# Patient Record
Sex: Female | Born: 1970 | Race: Black or African American | Hispanic: No | Marital: Single | Smoking: Former smoker
Health system: Southern US, Community
[De-identification: ages and names within clinical notes are randomized; demographics above are authoritative.]

## PROBLEM LIST (undated history)

## (undated) ENCOUNTER — Ambulatory Visit (HOSPITAL_COMMUNITY): Admission: EM | Source: Home / Self Care

## (undated) DIAGNOSIS — F32A Depression, unspecified: Secondary | ICD-10-CM

## (undated) DIAGNOSIS — Z720 Tobacco use: Secondary | ICD-10-CM

## (undated) DIAGNOSIS — C801 Malignant (primary) neoplasm, unspecified: Secondary | ICD-10-CM

## (undated) DIAGNOSIS — Z923 Personal history of irradiation: Secondary | ICD-10-CM

## (undated) DIAGNOSIS — D649 Anemia, unspecified: Secondary | ICD-10-CM

## (undated) DIAGNOSIS — K5190 Ulcerative colitis, unspecified, without complications: Secondary | ICD-10-CM

## (undated) DIAGNOSIS — K611 Rectal abscess: Secondary | ICD-10-CM

## (undated) DIAGNOSIS — F419 Anxiety disorder, unspecified: Secondary | ICD-10-CM

## (undated) DIAGNOSIS — K589 Irritable bowel syndrome without diarrhea: Secondary | ICD-10-CM

## (undated) DIAGNOSIS — E46 Unspecified protein-calorie malnutrition: Secondary | ICD-10-CM

## (undated) DIAGNOSIS — K219 Gastro-esophageal reflux disease without esophagitis: Secondary | ICD-10-CM

## (undated) HISTORY — DX: Malignant (primary) neoplasm, unspecified: C80.1

## (undated) HISTORY — DX: Ulcerative colitis, unspecified, without complications: K51.90

## (undated) HISTORY — DX: Depression, unspecified: F32.A

## (undated) HISTORY — DX: Anxiety disorder, unspecified: F41.9

---

## 2013-12-23 ENCOUNTER — Encounter (HOSPITAL_COMMUNITY): Payer: Self-pay | Admitting: Emergency Medicine

## 2013-12-23 ENCOUNTER — Emergency Department (HOSPITAL_COMMUNITY)
Admission: EM | Admit: 2013-12-23 | Discharge: 2013-12-23 | Disposition: A | Discharge status: Home / Self Care | Payer: Medicaid Other | Attending: Emergency Medicine | Admitting: Emergency Medicine

## 2013-12-23 DIAGNOSIS — L729 Follicular cyst of the skin and subcutaneous tissue, unspecified: Secondary | ICD-10-CM | POA: Diagnosis not present

## 2013-12-23 DIAGNOSIS — K130 Diseases of lips: Secondary | ICD-10-CM | POA: Diagnosis present

## 2013-12-23 DIAGNOSIS — K029 Dental caries, unspecified: Secondary | ICD-10-CM | POA: Insufficient documentation

## 2013-12-23 DIAGNOSIS — R220 Localized swelling, mass and lump, head: Secondary | ICD-10-CM

## 2013-12-23 DIAGNOSIS — K047 Periapical abscess without sinus: Secondary | ICD-10-CM | POA: Diagnosis not present

## 2013-12-23 DIAGNOSIS — L0291 Cutaneous abscess, unspecified: Secondary | ICD-10-CM

## 2013-12-23 DIAGNOSIS — Z720 Tobacco use: Secondary | ICD-10-CM | POA: Diagnosis not present

## 2013-12-23 MED ORDER — AMOXICILLIN-POT CLAVULANATE 500-125 MG PO TABS: 1.0000 | ORAL_TABLET | Freq: Three times a day (TID) | ORAL | Status: DC

## 2013-12-23 MED ORDER — IBUPROFEN 600 MG PO TABS: 600.0000 mg | ORAL_TABLET | Freq: Four times a day (QID) | ORAL | Status: DC | PRN

## 2013-12-23 MED ORDER — LIDOCAINE-EPINEPHRINE (PF) 2 %-1:200000 IJ SOLN
10.0000 mL | Freq: Once | INTRAMUSCULAR | Status: AC
  Administered 2013-12-23: 10 mL
  Filled 2013-12-23: qty 10

## 2013-12-23 NOTE — Discharge Instructions (Signed)
Be sure to complete all of your antibiotics even if you start to feel better.  Follow up with your dentist as well as your primary care provider for recheck of symptoms in 2-3 days to ensure they are improving.  You should be seen by a general surgeon for further evaluation and removal of cyst near your left eye.  Try not to rub, bump, or otherwise, irritate the cyst as this may cause cyst to get larger or become more sore.  See medical assistance if cyst becomes red, hot, causes fever, or other new concerning symptoms develop.

## 2013-12-23 NOTE — ED Notes (Signed)
Per pt, states she has boil under upper lip-states she has had before

## 2013-12-23 NOTE — ED Provider Notes (Signed)
CSN: [id number]     Arrival date & time 12/23/13  0998 History   First MD Initiated Contact with Patient 12/23/13 [phone]     Chief Complaint  Patient presents with  . Abscess     (Consider location/radiation/quality/duration/timing/severity/associated sxs/prior Treatment) HPI Pt is a 43yo female c/o upper lip pain and swelling that started 2 days ago, associated constant throbbing pain, 10/10 at worst, pain radiating to her nose "due to the pressure and swelling"  Pt reports hx of similar symptoms. States she normally has to have the doctor lance the site to let it drain. States she has been seen by her dentist who believes it is due to an ENT/sinus type issue rather than due to her gums and teeth.  Pt is in from out of town from [place] but will return home in the next 2-3 days.  Denies fever, n/v/d. Has not tried anything for symptoms.  Pt also c/o gradually worsening mass on left side of her face next hear left eye. Pt was advised by her PCP it was a cyst, it started 2 weeks ago but states it seems to be getting bigger and softer. Denies pain or change in color of skin but is concerned it may be an abscess. States she wants a second opinion.    History reviewed. No pertinent past medical history. History reviewed. No pertinent past surgical history. No family history on file. History  Substance Use Topics  . Smoking status: Current Every Day Smoker  . Smokeless tobacco: Not on file  . Alcohol Use: No   OB History   Grav Para Term Preterm Abortions TAB SAB Ect Mult Living                 Review of Systems  Constitutional: Negative for fever and chills.  HENT: Positive for facial swelling ( next to left eye and upper lip). Negative for sore throat, trouble swallowing and voice change.   Eyes: Negative for photophobia, pain, redness and visual disturbance.  Gastrointestinal: Negative for nausea and vomiting.  Musculoskeletal: Negative for myalgias, neck pain and neck stiffness.    Skin: Positive for wound. Negative for color change, pallor and rash.  All other systems reviewed and are negative.     Allergies  Review of patient's allergies indicates not on file.  Home Medications   Prior to Admission medications   Medication Sig Start Date End Date Taking? Authorizing Provider  amoxicillin-clavulanate (AUGMENTIN) 500-125 MG per tablet Take 1 tablet (500 mg total) by mouth every 8 (eight) hours. 12/23/13   Noland Fordyce, PA-C  ibuprofen (ADVIL,MOTRIN) 600 MG tablet Take 1 tablet (600 mg total) by mouth every 6 (six) hours as needed. 12/23/13   Noland Fordyce, PA-C   BP 114/77  Pulse 81  Temp(Src) 97.5 F (36.4 C) (Oral)  Resp 16  SpO2 100%  LMP 12/16/2013 Physical Exam  Nursing note and vitals reviewed. Constitutional: She is oriented to person, place, and time. She appears well-developed and well-nourished.  HENT:  Head: Normocephalic and atraumatic.    Nose: Nose normal.  Mouth/Throat: Uvula is midline, oropharynx is clear and moist and mucous membranes are normal. No oral lesions. Abnormal dentition (buccal mucosa of upper lip). Dental abscesses and dental caries present.    Partial fake tooth #8.  Mild edema with tenderness and fluctuance upper lip causing swelling just below nose. Tender inside and outside of upper lip. No active discharge.   Left side of face, corner of left eye: 1cm circular  mobile mass. No erythema or tenderness.  C/w sebaceous cyst.   Eyes: EOM are normal.  Neck: Normal range of motion.  Cardiovascular: Normal rate.   Pulmonary/Chest: Effort normal.  Musculoskeletal: Normal range of motion.  Neurological: She is alert and oriented to person, place, and time.  Skin: Skin is warm and dry.  Psychiatric: She has a normal mood and affect. Her behavior is normal.    ED Course  Procedures   INCISION AND DRAINAGE Performed by: Noland Fordyce A. Consent: Verbal consent obtained. Risks and benefits: risks, benefits and  alternatives were discussed Type: abscess  Body area: upper lip, buccal mucosa   Anesthesia: local infiltration  Incision was made with a scalpel.  Local anesthetic: lidocaine 2% with epinephrine  Anesthetic total: 0.5 ml  Complexity: complex Blunt dissection to break up loculations  Drainage: purulent  Drainage amount: moderate  Packing material: none  Patient tolerance: Patient tolerated the procedure well with no immediate complications.     Labs Review Labs Reviewed - No data to display  Imaging Review No results found.   EKG Interpretation None      MDM   Final diagnoses:  Abscess  Lip pain  Lip swelling  Cyst of skin    Pt presenting to ED with abscess of upper lip. Hx of same. I&D successfully performed w/o immediate complications. Due to relation to sinuses and pt's hx or recurrent abscess in same place, Rx: augmentin. Advised pt to f/u with ENT for further evaluation and treatment of abscess. Home care instructions provided. Mass on left side of pt's face c/w sebaceous cyst. Reassured pt lesion does not appear to be an abscess. No I&D needed at this time. Recommended pt f/u with general surgery when she gets back home for further evaluation and removal of cyst.  Return precautions provided. Pt verbalized understanding and agreement with tx plan.     Noland Fordyce, PA-C 12/23/13 1114

## 2013-12-23 NOTE — Progress Notes (Signed)
  CARE MANAGEMENT ED NOTE 12/23/2013  Patient:  Nicole Browning, Nicole Browning   Account Number:  [id number]  Date Initiated:  12/23/2013  Documentation initiated by:  Jackelyn Poling  Subjective/Objective Assessment:   43 yr old [place] access pt from [place] c/o boil under upper lip-states she has had before     Subjective/Objective Assessment Detail:   [facility] [address] [place] [phone] is pcp     Action/Plan:   updated epic   Action/Plan Detail:   Anticipated DC Date:  12/23/2013     Status Recommendation to Physician:   Result of Recommendation:    Other ED Services  Consult Working [place]  Other  PCP issues  Outpatient Services - Pt will follow up    Choice offered to / List presented to:            Status of service:  Completed, signed off  ED Comments:   ED Comments Detail:

## 2013-12-28 NOTE — ED Provider Notes (Signed)
Medical screening examination/treatment/procedure(s) were performed by non-physician practitioner and as supervising physician I was immediately available for consultation/collaboration.   EKG Interpretation None       Virgel Manifold, MD 12/28/13 1049

## 2014-12-19 DIAGNOSIS — Z3202 Encounter for pregnancy test, result negative: Secondary | ICD-10-CM | POA: Insufficient documentation

## 2014-12-19 DIAGNOSIS — F419 Anxiety disorder, unspecified: Secondary | ICD-10-CM | POA: Insufficient documentation

## 2014-12-19 DIAGNOSIS — Y998 Other external cause status: Secondary | ICD-10-CM | POA: Diagnosis not present

## 2014-12-19 DIAGNOSIS — Y9289 Other specified places as the place of occurrence of the external cause: Secondary | ICD-10-CM | POA: Diagnosis not present

## 2014-12-19 DIAGNOSIS — S90862A Insect bite (nonvenomous), left foot, initial encounter: Secondary | ICD-10-CM | POA: Diagnosis not present

## 2014-12-19 DIAGNOSIS — R000 Tachycardia, unspecified: Secondary | ICD-10-CM | POA: Insufficient documentation

## 2014-12-19 DIAGNOSIS — Z720 Tobacco use: Secondary | ICD-10-CM | POA: Insufficient documentation

## 2014-12-19 DIAGNOSIS — W57XXXA Bitten or stung by nonvenomous insect and other nonvenomous arthropods, initial encounter: Secondary | ICD-10-CM | POA: Insufficient documentation

## 2014-12-19 DIAGNOSIS — Y9389 Activity, other specified: Secondary | ICD-10-CM | POA: Insufficient documentation

## 2014-12-20 ENCOUNTER — Encounter (HOSPITAL_COMMUNITY): Payer: Self-pay | Admitting: *Deleted

## 2014-12-20 ENCOUNTER — Emergency Department (HOSPITAL_COMMUNITY)
Admission: EM | Admit: 2014-12-20 | Discharge: 2014-12-20 | Disposition: A | Discharge status: Home / Self Care | Payer: Medicaid Other | Attending: Emergency Medicine | Admitting: Emergency Medicine

## 2014-12-20 DIAGNOSIS — R21 Rash and other nonspecific skin eruption: Secondary | ICD-10-CM

## 2014-12-20 DIAGNOSIS — T63301A Toxic effect of unspecified spider venom, accidental (unintentional), initial encounter: Secondary | ICD-10-CM

## 2014-12-20 LAB — COMPREHENSIVE METABOLIC PANEL
ALT: 14 U/L (ref 14–54)
ANION GAP: 11 (ref 5–15)
AST: 18 U/L (ref 15–41)
Albumin: 4 g/dL (ref 3.5–5.0)
Alkaline Phosphatase: 57 U/L (ref 38–126)
BILIRUBIN TOTAL: 0.6 mg/dL (ref 0.3–1.2)
BUN: 10 mg/dL (ref 6–20)
CO2: 23 mmol/L (ref 22–32)
Calcium: 9.7 mg/dL (ref 8.9–10.3)
Chloride: 100 mmol/L — ABNORMAL LOW (ref 101–111)
Creatinine, Ser: 0.91 mg/dL (ref 0.44–1.00)
GFR calc Af Amer: >60 mL/min (ref >60)
Glucose, Bld: 107 mg/dL — ABNORMAL HIGH (ref 65–99)
POTASSIUM: 3.5 mmol/L (ref 3.5–5.1)
Sodium: 134 mmol/L — ABNORMAL LOW (ref 135–145)
TOTAL PROTEIN: 7.4 g/dL (ref 6.5–8.1)

## 2014-12-20 LAB — APTT: APTT: 26 s (ref 24–37)

## 2014-12-20 LAB — I-STAT BETA HCG BLOOD, ED (MC, WL, AP ONLY)

## 2014-12-20 LAB — CBC WITH DIFFERENTIAL/PLATELET
Basophils Absolute: 0 10*3/uL (ref 0.0–0.1)
Basophils Relative: 0 %
EOS PCT: 0 %
Eosinophils Absolute: 0 10*3/uL (ref 0.0–0.7)
HEMATOCRIT: 32.3 % — AB (ref 36.0–46.0)
Hemoglobin: 11.1 g/dL — ABNORMAL LOW (ref 12.0–15.0)
LYMPHS PCT: 11 %
Lymphs Abs: 1.6 10*3/uL (ref 0.7–4.0)
MCH: 29.1 pg (ref 26.0–34.0)
MCHC: 34.4 g/dL (ref 30.0–36.0)
MCV: 84.6 fL (ref 78.0–100.0)
MONOS PCT: 5 %
Monocytes Absolute: 0.6 10*3/uL (ref 0.1–1.0)
NEUTROS ABS: 11.6 10*3/uL — AB (ref 1.7–7.7)
Neutrophils Relative %: 84 %
PLATELETS: 361 10*3/uL (ref 150–400)
RBC: 3.82 MIL/uL — ABNORMAL LOW (ref 3.87–5.11)
RDW: 13.2 % (ref 11.5–15.5)
WBC: 13.8 10*3/uL — ABNORMAL HIGH (ref 4.0–10.5)

## 2014-12-20 LAB — PROTIME-INR
INR: 1.09 (ref 0.00–1.49)
PROTHROMBIN TIME: 14.3 s (ref 11.6–15.2)

## 2014-12-20 LAB — CK: Total CK: 106 U/L (ref 38–234)

## 2014-12-20 MED ORDER — SODIUM CHLORIDE 0.9 % IV BOLUS (SEPSIS)
1000.0000 mL | Freq: Once | INTRAVENOUS | Status: AC
  Administered 2014-12-20: 1000 mL via INTRAVENOUS

## 2014-12-20 MED ORDER — DIPHENHYDRAMINE HCL 50 MG/ML IJ SOLN
25.0000 mg | Freq: Once | INTRAMUSCULAR | Status: AC
  Administered 2014-12-20: 25 mg via INTRAVENOUS
  Filled 2014-12-20: qty 1

## 2014-12-20 MED ORDER — ONDANSETRON HCL 4 MG/2ML IJ SOLN
4.0000 mg | Freq: Once | INTRAMUSCULAR | Status: AC
  Administered 2014-12-20: 4 mg via INTRAVENOUS
  Filled 2014-12-20: qty 2

## 2014-12-20 MED ORDER — FAMOTIDINE IN NACL 20-0.9 MG/50ML-% IV SOLN
20.0000 mg | Freq: Once | INTRAVENOUS | Status: AC
  Administered 2014-12-20: 20 mg via INTRAVENOUS
  Filled 2014-12-20: qty 50

## 2014-12-20 MED ORDER — ASPIRIN EC 325 MG PO TBEC
325.0000 mg | DELAYED_RELEASE_TABLET | Freq: Once | ORAL | Status: AC
  Administered 2014-12-20: 325 mg via ORAL
  Filled 2014-12-20: qty 1

## 2014-12-20 MED ORDER — HYDROCODONE-ACETAMINOPHEN 5-325 MG PO TABS: 1.0000 | ORAL_TABLET | ORAL | Status: DC | PRN

## 2014-12-20 MED ORDER — METHYLPREDNISOLONE SODIUM SUCC 125 MG IJ SOLR
125.0000 mg | Freq: Once | INTRAMUSCULAR | Status: AC
  Administered 2014-12-20: 125 mg via INTRAVENOUS
  Filled 2014-12-20: qty 2

## 2014-12-20 MED ORDER — DIPHENHYDRAMINE HCL 25 MG PO TABS
25.0000 mg | ORAL_TABLET | Freq: Four times a day (QID) | ORAL | Status: DC | PRN
Start: 2014-12-20 — End: 2016-02-29

## 2014-12-20 MED ORDER — PREDNISONE 20 MG PO TABS: ORAL_TABLET | ORAL | Status: DC

## 2014-12-20 MED ORDER — LORAZEPAM 2 MG/ML IJ SOLN
0.5000 mg | Freq: Once | INTRAMUSCULAR | Status: AC
  Administered 2014-12-20: 0.5 mg via INTRAVENOUS
  Filled 2014-12-20: qty 1

## 2014-12-20 NOTE — Discharge Instructions (Signed)
Spider Bite Spider bites are not common. When spider bites do happen, most do not cause serious health problems. There are only a few types of spider bites that can cause serious health problems. CAUSES A spider bite usually happens when a person accidentally makes contact with a spider in a way that traps the spider against the person's skin. SYMPTOMS Symptoms may vary depending on the type of spider. Some spider bites may cause symptoms within 1 hour after the bite. For other spider bites, it may take 1-2 days for symptoms to develop. Common symptoms include:  Redness and swelling in the area of the bite.  Discomfort or pain in the area of the bite. A few types of spiders, such as the black widow spider or the brown recluse spider, can inject poison (venom) into a bite wound. This venom causes more serious symptoms. Symptoms of a venomous spider bite vary, and may include:  Muscle cramps.  Nausea, vomiting, or abdominal pain.  Fever.  A skin sore (lesion) that spreads. This can break into an open wound (skin ulcer).  Light-headedness or dizziness. DIAGNOSIS This condition may be diagnosed based on your symptoms and a physical exam. Your health care provider will ask about the history of your injury and any details you may have about the spider. This may help to determine what type of spider it was that bit you. TREATMENT Many spider bites do not require treatment. If needed, treatment may include:  Icing and keeping the bite area raised (elevated).  Over-the-counter or prescription medicines to help control symptoms.  A tetanus shot.  Antibiotic medicines. HOME CARE INSTRUCTIONS Medicines  Take or apply over-the-counter and prescription medicines only as told by your health care provider.  If you were prescribed an antibiotic medicine, take or apply it as told by your health care provider. Do not stop using the antibiotic even if your condition improves. General  Instructions  Do not scratch the bite area.  Keep the bite area clean and dry. Wash the bite area daily with soap and water as told by your health care provider.  If directed, apply ice to the bite area.  Put ice in a plastic bag.  Place a towel between your skin and the bag.  Leave the ice on for 20 minutes, 2-3 times per day.  Elevate the affected area above the level of your heart while you are sitting or lying down, if possible.  Keep all follow-up visits as told by your health care provider. This is important. SEEK MEDICAL CARE IF:  Your bite does not get better after 3 days of treatment.  Your bite turns black or purple.  You have increased redness, swelling, or pain at the site of the bite. SEEK IMMEDIATE MEDICAL CARE IF:  You develop shortness of breath or chest pain.  You have fluid, blood, or pus coming from the bite area.  You have muscle cramps or painful muscle spasms.  You develop abdominal pain, nausea, or vomiting.  You feel unusually tired (fatigued) or sleepy.   This information is not intended to replace advice given to you by your health care provider. Make sure you discuss any questions you have with your health care provider.   Document Released: 03/27/2004 Document Revised: 11/08/2014 Document Reviewed: 07/05/2014 Elsevier Interactive Patient Education [employer].

## 2014-12-20 NOTE — ED Provider Notes (Signed)
CSN: [id number]   Arrival date & time 12/19/14 2346  History  By signing my name below, I, Nicole Browning, attest that this documentation has been prepared under the direction and in the presence of Julianne Rice, MD. Electronically Signed: Altamease Browning, ED Scribe. 12/20/2014. 12:24 AM.  Chief Complaint  Patient presents with  . Allergic Reaction  . Insect Bite    HPI The history is provided by the patient. No language interpreter was used.   Nicole Browning is a 44 y.o. female who presents to the Emergency Department complaining of possible spider bite with onset 30 minutes PTA. Pt states that she was clearing spider webs from a friend's house when she felt something bite her left 4th toe inside her sneaker. Pt did not see the insect that bit her. She describes the spiders in the house as "little" but notes that it was dark and she did not see them clearly. Since then she has a painful blister at the left 4th toe and a worsening red and painful rash over the bilateral lower extremities. She describes the pain as burning. Pt denies feeling hot or flushed, nausea, vomiting, tongue or throat swelling, and difficulty breathing. No chest pain or abdominal pain.   History reviewed. No pertinent past medical history.  History reviewed. No pertinent past surgical history.  History reviewed. No pertinent family history.  Social History  Substance Use Topics  . Smoking status: Current Every Day Smoker  . Smokeless tobacco: None  . Alcohol Use: No     Review of Systems  Constitutional: Negative for fever and chills.  HENT: Negative for facial swelling and trouble swallowing.   Respiratory: Negative for shortness of breath, wheezing and stridor.   Cardiovascular: Negative for chest pain and palpitations.  Gastrointestinal: Negative for nausea, vomiting and abdominal pain.  Musculoskeletal: Positive for myalgias. Negative for back pain, neck pain and neck stiffness.  Skin: Positive for  rash. Negative for wound.  Neurological: Negative for dizziness, syncope, weakness, light-headedness, numbness and headaches.  Psychiatric/Behavioral: The patient is nervous/anxious.   All other systems reviewed and are negative.  Home Medications   Prior to Admission medications   Medication Sig Start Date End Date Taking? Authorizing Provider  diphenhydrAMINE (BENADRYL) 25 MG tablet Take 1 tablet (25 mg total) by mouth every 6 (six) hours as needed for itching. 12/20/14   Julianne Rice, MD  HYDROcodone-acetaminophen (NORCO) 5-325 MG tablet Take 1 tablet by mouth every 4 (four) hours as needed for severe pain. 12/20/14   Julianne Rice, MD  predniSONE (DELTASONE) 20 MG tablet 3 tabs po day one, then 2 po daily x 4 days 12/20/14   Julianne Rice, MD    Allergies  Percocet and Valium  Triage Vitals: BP 126/74 mmHg  Pulse 134  Temp(Src) 98.3 F (36.8 C) (Oral)  Resp 16  SpO2 99%  LMP 11/22/2014 (Approximate)  Physical Exam  Constitutional: She is oriented to person, place, and time. She appears well-developed and well-nourished. No distress.  HENT:  Head: Normocephalic and atraumatic.  Mouth/Throat: Oropharynx is clear and moist. No oropharyngeal exudate.  Eyes: EOM are normal. Pupils are equal, round, and reactive to light.  Neck: Normal range of motion. Neck supple.  Cardiovascular: Regular rhythm.  Exam reveals no gallop and no friction rub.   No murmur heard. Tachycardia  Pulmonary/Chest: Effort normal and breath sounds normal. No stridor. No respiratory distress. She has no wheezes. She has no rales.  Abdominal: Soft. Bowel sounds are normal. She exhibits no  distension and no mass. There is no tenderness. There is no rebound and no guarding.  Musculoskeletal: Normal range of motion. She exhibits no edema or tenderness.  Patient has 2 small vesicles on the dorsal surface of the fourth digit of the left foot. There is mild surrounding erythema. Patient also has macular  erythematous rash extending up both lower extremities and irregular pattern. It is warm to touch. Distal pulses intact.  Neurological: She is alert and oriented to person, place, and time.  Moves all extremities without deficit. Sensation intact.  Skin: Skin is warm and dry. Rash noted. There is erythema.  Psychiatric: She has a normal mood and affect. Her behavior is normal.  Nursing note and vitals reviewed.   ED Course  Procedures   DIAGNOSTIC STUDIES: Oxygen Saturation is 99% on RA, normal by my interpretation.    COORDINATION OF CARE: 12:23 AM Discussed treatment plan which includes lab work, EKG, Ativan, Zofra, and IVF with pt at bedside and pt agreed to plan.  1:03 AM I re-evaluated the patient and her heart rate has improved.    Labs Reviewed  CBC WITH DIFFERENTIAL/PLATELET - Abnormal; Notable for the following:    WBC 13.8 (*)    RBC 3.82 (*)    Hemoglobin 11.1 (*)    HCT 32.3 (*)    Neutro Abs 11.6 (*)    All other components within normal limits  COMPREHENSIVE METABOLIC PANEL - Abnormal; Notable for the following:    Sodium 134 (*)    Chloride 100 (*)    Glucose, Bld 107 (*)    All other components within normal limits  PROTIME-INR  APTT  CK  I-STAT BETA HCG BLOOD, ED ([facility], [facility], [facility] ONLY)    Imaging Review No results found.  I personally reviewed and evaluated these lab results as a part of my medical decision-making.  EKG Interpretation  Date/Time:  Wednesday December 20 2014 00:56:13 EDT Ventricular Rate:  105 PR Interval:  191 QRS Duration: 84 QT Interval:  323 QTC Calculation: 427 R Axis:   80 Text Interpretation:  Sinus tachycardia Right atrial enlargement Confirmed by Lita Mains  MD, Raniah Karan ([id number]) on 12/20/2014 6:20:18 AM   MDM   Final diagnoses:  Rash and nonspecific skin eruption  Spider bite, accidental or unintentional, initial encounter     I, [name], personally performed the services described in this documentation. All  medical record entries made by the scribe were at my direction and in my presence.  I have reviewed the chart and discharge instructions and agree that the record reflects my personal performance and is accurate and complete. Dundy, Miller.  12/20/2014. 6:20 AM.   Patient with unknown bite to the fourth digit of her left foot. Blistering present. Rash to bilateral lower extremities. No constitutional signs or symptoms. Airway is intact. No intraoral swelling. Suspect localized reaction to possible spider bite.  Redness to the legs have mildly improved. Patient has been observed in the emergency department for greater than 6 hours now. No respiratory compromise. Vital signs are stable. Suspect inflammatory response versus allergic reaction. Do not believe that the patient's rash represents an infectious process. Patient understands the need to return immediately for any worsening of the rash, facial swelling, difficulty breathing, fever or for any concerns.  Julianne Rice, MD 12/20/14 [id number]

## 2014-12-20 NOTE — ED Notes (Signed)
Pt states she was bitten by something in her shoe twenty minutes ago. Pt presents with blister to left fourth toe and red rash to bilateral legs up to thighs. Pt states her legs burn, no respiratory distress noted

## 2015-05-03 ENCOUNTER — Emergency Department (HOSPITAL_COMMUNITY)
Admission: EM | Admit: 2015-05-03 | Discharge: 2015-05-03 | Disposition: A | Discharge status: Home / Self Care | Payer: Medicaid Other | Source: Home / Self Care

## 2015-05-20 ENCOUNTER — Emergency Department (INDEPENDENT_AMBULATORY_CARE_PROVIDER_SITE_OTHER)
Admission: EM | Admit: 2015-05-20 | Discharge: 2015-05-20 | Disposition: A | Discharge status: Home / Self Care | Payer: Medicaid Other | Source: Home / Self Care | Attending: Emergency Medicine | Admitting: Emergency Medicine

## 2015-05-20 ENCOUNTER — Encounter (HOSPITAL_COMMUNITY): Payer: Self-pay | Admitting: Emergency Medicine

## 2015-05-20 DIAGNOSIS — L02214 Cutaneous abscess of groin: Secondary | ICD-10-CM | POA: Diagnosis not present

## 2015-05-20 MED ORDER — HYDROCODONE-ACETAMINOPHEN 5-325 MG PO TABS: 1.0000 | ORAL_TABLET | ORAL | Status: DC | PRN

## 2015-05-20 MED ORDER — SULFAMETHOXAZOLE-TRIMETHOPRIM 800-160 MG PO TABS: 1.0000 | ORAL_TABLET | Freq: Two times a day (BID) | ORAL | Status: AC

## 2015-05-20 MED ORDER — LIDOCAINE HCL (PF) 2 % IJ SOLN
INTRAMUSCULAR | Status: AC
  Filled 2015-05-20: qty 2

## 2015-05-20 NOTE — ED Notes (Signed)
C/o abscess on pubic area onset x2 days... Has been applying Epson salt warm compression w/no relief Abscess is getting bigger and more painful... Denies fevers, drainage A&O x4... No acute distress.

## 2015-05-20 NOTE — ED Provider Notes (Signed)
CSN: [id number]     Arrival date & time 05/20/15  1450 History   First MD Initiated Contact with Nicole Browning 05/20/15 1616     Chief Complaint  Nicole Browning presents with  . Abscess   (Consider location/radiation/quality/duration/timing/severity/associated sxs/prior Treatment) HPI  Nicole Browning is a 45 year old woman here for evaluation of abscess. Nicole Browning states Nicole Browning noticed a small bump on the left vulva 2 days ago. Nicole Browning has been doing [employer] using an over-the-counter boil cream without improvement. It has gradually gotten larger and more tender. Nicole Browning denies any fevers. Nicole Browning states Nicole Browning used to have these frequently when Nicole Browning was shaving. Nicole Browning has since stopped shaving, and this is the first time Nicole Browning's had one.  History reviewed. No pertinent past medical history. History reviewed. No pertinent past surgical history. No family history on file. Social History  Substance Use Topics  . Smoking status: Current Every Day Smoker  . Smokeless tobacco: None  . Alcohol Use: No   OB History    No data available     Review of Systems As in history of present illness Allergies  Percocet and Valium  Home Medications   Prior to Admission medications   Medication Sig Start Date End Date Taking? Authorizing Provider  diphenhydrAMINE (BENADRYL) 25 MG tablet Take 1 tablet (25 mg total) by mouth every 6 (six) hours as needed for itching. 12/20/14   Julianne Rice, MD  HYDROcodone-acetaminophen (NORCO) 5-325 MG tablet Take 1 tablet by mouth every 4 (four) hours as needed for severe pain. 05/20/15   Melony Overly, MD  sulfamethoxazole-trimethoprim (BACTRIM DS,SEPTRA DS) 800-160 MG tablet Take 1 tablet by mouth 2 (two) times daily. 05/20/15 05/27/15  Melony Overly, MD   Meds Ordered and Administered this Visit  Medications - No data to display  BP 110/76 mmHg  Pulse 85  Temp(Src) 98.4 F (36.9 C) (Oral)  Resp 18  SpO2 100%  LMP 05/10/2015 No data found.   Physical Exam  Constitutional: Nicole Browning is oriented to  person, place, and time. Nicole Browning appears well-developed and well-nourished. No distress.  Cardiovascular: Normal rate.   Pulmonary/Chest: Effort normal.  Genitourinary:  2.5 cm boil to left vulva. There is central fluctuance.  Neurological: Nicole Browning is alert and oriented to person, place, and time.    ED Course  .Marland KitchenIncision and Drainage Date/Time: 05/20/2015 4:51 PM Performed by: Melony Overly Authorized by: Melony Overly Consent: Verbal consent obtained. Risks and benefits: risks, benefits and alternatives were discussed Consent given by: Nicole Browning Nicole Browning understanding: Nicole Browning states understanding of the procedure being performed Nicole Browning identity confirmed: verbally with Nicole Browning Time out: Immediately prior to procedure a "time out" was called to verify the correct Nicole Browning, procedure, equipment, support staff and site/side marked as required. Type: abscess Body area: anogenital Location details: vulva Anesthesia: local infiltration Local anesthetic: lidocaine 2% without epinephrine Anesthetic total: 3 ml Scalpel size: 11 Incision type: single straight Incision depth: dermal Complexity: simple Drainage: purulent Drainage amount: copious Packing material: 1/4 in iodoform gauze Nicole Browning tolerance: Nicole Browning tolerated the procedure well with no immediate complications   (including critical care time)  Labs Review Labs Reviewed - No data to display  Imaging Review No results found.    MDM   1. Abscess, groin    I&D performed. Wound packed. Wound care instructions given. Bactrim for 7 days. Prescription given for hydrocodone for pain. Follow-up as needed.    Melony Overly, MD 05/20/15 [phone]

## 2015-05-20 NOTE — Discharge Instructions (Signed)
We drained the abscess today. Please leave the packing in for 2 days. Take Bactrim 1 pill twice a day for 7 days. Take this with food. You can take Tylenol or ibuprofen as needed for pain. Use the hydrocodone every 4-6 hours as needed for severe pain. Follow-up as needed.

## 2015-07-09 ENCOUNTER — Ambulatory Visit: Payer: Medicaid Other | Admitting: Gynecologic Oncology

## 2015-09-03 ENCOUNTER — Ambulatory Visit (HOSPITAL_COMMUNITY)
Admission: EM | Admit: 2015-09-03 | Discharge: 2015-09-03 | Disposition: A | Discharge status: Home / Self Care | Payer: Medicaid Other | Attending: Family Medicine | Admitting: Family Medicine

## 2015-09-03 ENCOUNTER — Encounter (HOSPITAL_COMMUNITY): Payer: Self-pay | Admitting: Nurse Practitioner

## 2015-09-03 DIAGNOSIS — L02412 Cutaneous abscess of left axilla: Secondary | ICD-10-CM

## 2015-09-03 MED ORDER — SULFAMETHOXAZOLE-TRIMETHOPRIM 800-160 MG PO TABS: 1.0000 | ORAL_TABLET | Freq: Two times a day (BID) | ORAL | Status: AC

## 2015-09-03 NOTE — ED Provider Notes (Signed)
CSN: [id number]     Arrival date & time 09/03/15  1528 History   First MD Initiated Contact with Patient 09/03/15 1611     Chief Complaint  Patient presents with  . Skin Problem   (Consider location/radiation/quality/duration/timing/severity/associated sxs/prior Treatment) Patient is a 45 y.o. female presenting with abscess. The history is provided by the patient.  Abscess Location:  Shoulder/arm Shoulder/arm abscess location:  L axilla Abscess quality: fluctuance, induration and painful   Red streaking: no   Duration:  3 days Progression:  Worsening Pain details:    Progression:  Worsening Chronicity:  New Context: skin injury   Ineffective treatments:  Warm compresses Associated symptoms: no fever   Risk factors: no prior abscess     History reviewed. No pertinent past medical history. History reviewed. No pertinent past surgical history. History reviewed. No pertinent family history. Social History  Substance Use Topics  . Smoking status: Current Some Day Smoker  . Smokeless tobacco: None  . Alcohol Use: No   OB History    No data available     Review of Systems  Constitutional: Negative.  Negative for fever.  Skin: Positive for rash.  All other systems reviewed and are negative.   Allergies  Percocet and Valium  Home Medications   Prior to Admission medications   Medication Sig Start Date End Date Taking? Authorizing Provider  diphenhydrAMINE (BENADRYL) 25 MG tablet Take 1 tablet (25 mg total) by mouth every 6 (six) hours as needed for itching. 12/20/14   Julianne Rice, MD  HYDROcodone-acetaminophen (NORCO) 5-325 MG tablet Take 1 tablet by mouth every 4 (four) hours as needed for severe pain. 05/20/15   Melony Overly, MD  sulfamethoxazole-trimethoprim (BACTRIM DS,SEPTRA DS) 800-160 MG tablet Take 1 tablet by mouth 2 (two) times daily. 09/03/15 09/10/15  Billy Fischer, MD   Meds Ordered and Administered this Visit  Medications - No data to display  BP 106/70  mmHg  Pulse 90  Temp(Src) 98.8 F (37.1 C) (Oral)  Resp 12  SpO2 100% No data found.   Physical Exam  Constitutional: She is oriented to person, place, and time. She appears well-developed and well-nourished. No distress.  Musculoskeletal: She exhibits tenderness.  Neurological: She is alert and oriented to person, place, and time.  Skin: Skin is warm and dry.  3cm fluctuant abscess to left axilla.  Nursing note and vitals reviewed.   ED Course  .Marland KitchenIncision and Drainage Date/Time: 09/03/2015 4:33 PM Performed by: Billy Fischer Authorized by: Ihor Gully D Consent: Verbal consent obtained. Consent given by: patient Type: abscess Body area: trunk Location details: chest Local anesthetic: topical anesthetic Patient sedated: no Scalpel size: 11 Incision type: single straight Incision depth: subcutaneous Complexity: simple Drainage: purulent Drainage amount: copious Wound treatment: wound left open Patient tolerance: Patient tolerated the procedure well with no immediate complications   (including critical care time)  Labs Review Labs Reviewed - No data to display  Imaging Review No results found.   Visual Acuity Review  Right Eye Distance:   Left Eye Distance:   Bilateral Distance:    Right Eye Near:   Left Eye Near:    Bilateral Near:         MDM   1. Abscess of left axilla       Billy Fischer, MD 09/03/15 1640

## 2015-09-03 NOTE — ED Notes (Signed)
Pt c/o 3 day history of painful boil under her L armpit. She has been applying warm washcloths but boil continues to get bigger

## 2015-10-23 ENCOUNTER — Emergency Department (HOSPITAL_COMMUNITY)
Admission: EM | Admit: 2015-10-23 | Discharge: 2015-10-23 | Disposition: A | Discharge status: Home / Self Care | Payer: Medicaid Other | Attending: Emergency Medicine | Admitting: Emergency Medicine

## 2015-10-23 ENCOUNTER — Encounter (HOSPITAL_COMMUNITY): Payer: Self-pay

## 2015-10-23 DIAGNOSIS — F17210 Nicotine dependence, cigarettes, uncomplicated: Secondary | ICD-10-CM | POA: Diagnosis not present

## 2015-10-23 DIAGNOSIS — R1084 Generalized abdominal pain: Secondary | ICD-10-CM

## 2015-10-23 DIAGNOSIS — R197 Diarrhea, unspecified: Secondary | ICD-10-CM | POA: Insufficient documentation

## 2015-10-23 DIAGNOSIS — Z79899 Other long term (current) drug therapy: Secondary | ICD-10-CM | POA: Insufficient documentation

## 2015-10-23 DIAGNOSIS — R109 Unspecified abdominal pain: Secondary | ICD-10-CM | POA: Diagnosis present

## 2015-10-23 LAB — COMPREHENSIVE METABOLIC PANEL
ALT: 10 U/L — AB (ref 14–54)
AST: 11 U/L — AB (ref 15–41)
Albumin: 2.8 g/dL — ABNORMAL LOW (ref 3.5–5.0)
Alkaline Phosphatase: 52 U/L (ref 38–126)
Anion gap: 8 (ref 5–15)
BILIRUBIN TOTAL: 0.5 mg/dL (ref 0.3–1.2)
BUN: 5 mg/dL — ABNORMAL LOW (ref 6–20)
CALCIUM: 8.8 mg/dL — AB (ref 8.9–10.3)
CO2: 26 mmol/L (ref 22–32)
Chloride: 96 mmol/L — ABNORMAL LOW (ref 101–111)
Creatinine, Ser: 0.94 mg/dL (ref 0.44–1.00)
Glucose, Bld: 93 mg/dL (ref 65–99)
Potassium: 3.5 mmol/L (ref 3.5–5.1)
Sodium: 130 mmol/L — ABNORMAL LOW (ref 135–145)
TOTAL PROTEIN: 7.2 g/dL (ref 6.5–8.1)

## 2015-10-23 LAB — CBC
HEMATOCRIT: 31.3 % — AB (ref 36.0–46.0)
Hemoglobin: 10.1 g/dL — ABNORMAL LOW (ref 12.0–15.0)
MCH: 27.4 pg (ref 26.0–34.0)
MCHC: 32.3 g/dL (ref 30.0–36.0)
MCV: 85.1 fL (ref 78.0–100.0)
Platelets: 468 10*3/uL — ABNORMAL HIGH (ref 150–400)
RBC: 3.68 MIL/uL — ABNORMAL LOW (ref 3.87–5.11)
RDW: 13 % (ref 11.5–15.5)
WBC: 8.4 10*3/uL (ref 4.0–10.5)

## 2015-10-23 LAB — URINALYSIS, ROUTINE W REFLEX MICROSCOPIC
GLUCOSE, UA: NEGATIVE mg/dL
KETONES UR: 40 mg/dL — AB
LEUKOCYTES UA: NEGATIVE
Nitrite: NEGATIVE
PH: 6 (ref 5.0–8.0)
Protein, ur: 30 mg/dL — AB
Specific Gravity, Urine: 1.023 (ref 1.005–1.030)

## 2015-10-23 LAB — URINE MICROSCOPIC-ADD ON

## 2015-10-23 LAB — I-STAT BETA HCG BLOOD, ED (MC, WL, AP ONLY): I-stat hCG, quantitative: <5 m[IU]/mL (ref <5)

## 2015-10-23 LAB — LIPASE, BLOOD: LIPASE: 14 U/L (ref 11–51)

## 2015-10-23 MED ORDER — DICYCLOMINE HCL 20 MG PO TABS: 20.0000 mg | ORAL_TABLET | Freq: Three times a day (TID) | ORAL | 0 refills | Status: DC | PRN

## 2015-10-23 MED ORDER — DICYCLOMINE HCL 10 MG PO CAPS
10.0000 mg | ORAL_CAPSULE | Freq: Once | ORAL | Status: AC
  Administered 2015-10-23: 10 mg via ORAL
  Filled 2015-10-23: qty 1

## 2015-10-23 NOTE — ED Provider Notes (Signed)
[facility] DEPT Provider Note   CSN: [id number] Arrival date & time: 10/23/15  1538     History   Chief Complaint Chief Complaint  Patient presents with  . Abdominal Pain    HPI Nicole Browning is a 45 y.o. female.  The history is provided by the patient and medical records. No language interpreter was used.    Nicole Browning is a 45 y.o. female  with no pertinent PMH who presents to the Emergency Department complaining of intermittent abdominal pain described as cramping for the last two weeks. She states there were 2 days throughout the last two weeks where she was asymptomatic, but pain returned on Sunday. Patient does not believe there has been blood in the stool because she has hx of hemorrhoids which often bleed. No medications taken prior to arrival for symptoms. Associated symptoms include diarrhea, 2-3 episodes of loose stool each day. She had emesis last week, but has not had any further vomiting this week. No alleviating or aggravating factors noted. No fever chest pain, shortness of breath, back pain, dysuria, vaginal discharge, vaginal bleeding, or other associated symptoms. No recent travel, sick contacts, or new foods.    History reviewed. No pertinent past medical history.  There are no active problems to display for this patient.   History reviewed. No pertinent surgical history.  OB History    No data available       Home Medications    Prior to Admission medications   Medication Sig Start Date End Date Taking? Authorizing Provider  calcium carbonate (TUMS - DOSED IN MG ELEMENTAL CALCIUM) 500 MG chewable tablet Chew 1 tablet by mouth daily. For heartburn, stomach ache   Yes Historical Provider, MD  esomeprazole (NEXIUM) 40 MG capsule Take 40 mg by mouth daily.   Yes Historical Provider, MD  dicyclomine (BENTYL) 20 MG tablet Take 1 tablet (20 mg total) by mouth 3 (three) times daily as needed (abdominal cramps). 10/23/15   Nicole Almond Zaylin Pistilli, PA-C    diphenhydrAMINE (BENADRYL) 25 MG tablet Take 1 tablet (25 mg total) by mouth every 6 (six) hours as needed for itching. Patient not taking: Reported on 10/23/2015 12/20/14   Julianne Rice, MD  HYDROcodone-acetaminophen [facility]) 5-325 MG tablet Take 1 tablet by mouth every 4 (four) hours as needed for severe pain. Patient not taking: Reported on 10/23/2015 05/20/15   Melony Overly, MD    Family History History reviewed. No pertinent family history.  Social History Social History  Substance Use Topics  . Smoking status: Current Some Day Smoker    Packs/day: 0.50    Types: Cigarettes  . Smokeless tobacco: Never Used  . Alcohol use No     Allergies   Percocet [oxycodone-acetaminophen] and Valium [diazepam]   Review of Systems Review of Systems  Constitutional: Negative for chills and fever.  HENT: Negative for congestion.   Eyes: Negative for visual disturbance.  Respiratory: Negative for cough and shortness of breath.   Cardiovascular: Negative.   Gastrointestinal: Positive for abdominal pain and diarrhea. Negative for nausea and vomiting.  Genitourinary: Negative for dysuria, urgency, vaginal bleeding and vaginal discharge.  Musculoskeletal: Negative for back pain and neck pain.  Skin: Negative for rash.  Neurological: Negative for headaches.     Physical Exam Updated Vital Signs BP 94/61 (BP Location: Left Arm)   Pulse 93   Temp 99.1 F (37.3 C) (Oral)   Resp 16   LMP 10/02/2015   SpO2 97%   Physical Exam  Constitutional:  She is oriented to person, place, and time. She appears well-developed and well-nourished. No distress.  Non-toxic appearing.   HENT:  Head: Normocephalic and atraumatic.  Cardiovascular: Normal rate, regular rhythm, normal heart sounds and intact distal pulses.   Pulmonary/Chest: Effort normal and breath sounds normal. No respiratory distress. She exhibits no tenderness.  Abdominal: Soft. Bowel sounds are normal. She exhibits no distension.  There is tenderness (Generalized).  Musculoskeletal: Normal range of motion. She exhibits no edema.  Neurological: She is alert and oriented to person, place, and time.  Skin: Skin is warm and dry. Capillary refill takes less than 2 seconds.  Nursing note and vitals reviewed.    ED Treatments / Results  Labs (all labs ordered are listed, but only abnormal results are displayed) Labs Reviewed  COMPREHENSIVE METABOLIC PANEL - Abnormal; Notable for the following:       Result Value   Sodium 130 (*)    Chloride 96 (*)    BUN 5 (*)    Calcium 8.8 (*)    Albumin 2.8 (*)    AST 11 (*)    ALT 10 (*)    All other components within normal limits  CBC - Abnormal; Notable for the following:    RBC 3.68 (*)    Hemoglobin 10.1 (*)    HCT 31.3 (*)    Platelets 468 (*)    All other components within normal limits  URINALYSIS, ROUTINE W REFLEX MICROSCOPIC (NOT AT [facility]) - Abnormal; Notable for the following:    Color, Urine AMBER (*)    APPearance TURBID (*)    Hgb urine dipstick LARGE (*)    Bilirubin Urine SMALL (*)    Ketones, ur 40 (*)    Protein, ur 30 (*)    All other components within normal limits  URINE MICROSCOPIC-ADD ON - Abnormal; Notable for the following:    Squamous Epithelial / LPF 6-30 (*)    Bacteria, UA MANY (*)    All other components within normal limits  URINE CULTURE  LIPASE, BLOOD  MAGNESIUM  I-STAT BETA HCG BLOOD, ED ([facility], [facility], [facility] ONLY)    EKG  EKG Interpretation None       Radiology No results found.  Procedures Procedures (including critical care time)  Medications Ordered in ED Medications  dicyclomine (BENTYL) capsule 10 mg (10 mg Oral Given 10/23/15 2320)     Initial Impression / Assessment and Plan / ED Course  I have reviewed the triage vital signs and the nursing notes.  Pertinent labs & imaging results that were available during my care of the patient were reviewed by me and considered in my medical decision making (see chart for  details).  Clinical Course   Nicole Browning is a 45 y.o. female who presents to ED for generalized abdominal pain with diarrhea x 2 weeks. On exam, patient with generalized abdominal tenderness, no peritoneal signs. Labs reviewed and reassuring. UA reviewed - no urinary symptoms, no CVA tenderness. Will send for culture.   Patient is nontoxic, nonseptic appearing, in no apparent distress. Bentyl given in ED for symptoms. Patient does not meet the SIRS or Sepsis criteria. On repeat exam patient does not have a surgical abdomin and there are no peritoneal signs. No indication of appendicitis, bowel obstruction, bowel perforation, cholecystitis, diverticulitis, PID or ectopic pregnancy. Patient discharged home with symptomatic treatment and given strict instructions for follow-up with their primary care physician. I have also discussed reasons to return immediately to the ER. Patient expresses  understanding and agrees with plan.  Patient seen by and discussed with Dr. Kathrynn Humble who agrees with treatment plan.   Final Clinical Impressions(s) / ED Diagnoses   Final diagnoses:  Generalized abdominal pain  Diarrhea, unspecified type    New Prescriptions Discharge Medication List as of 10/23/2015 11:04 PM    START taking these medications   Details  dicyclomine (BENTYL) 20 MG tablet Take 1 tablet (20 mg total) by mouth 3 (three) times daily as needed (abdominal cramps)., Starting Tue 10/23/2015, Print         [facility] Janssen Zee, PA-C 10/24/15 4195    Varney Biles, MD 10/24/15 [phone]

## 2015-10-23 NOTE — ED Notes (Signed)
Pt d/c home at this time.

## 2015-10-23 NOTE — Discharge Instructions (Signed)
Take Bentyl as needed for abdominal cramping.  Return to ER for new or worsening symptoms, any additional concerns.

## 2015-10-23 NOTE — ED Triage Notes (Signed)
Onset 1 week abd pain,  Nausea, vomiting small amount.  NO one in household sick.

## 2015-10-25 LAB — URINE CULTURE

## 2015-11-12 ENCOUNTER — Inpatient Hospital Stay: Payer: Medicaid Other | Admitting: Internal Medicine

## 2016-02-20 ENCOUNTER — Encounter (HOSPITAL_COMMUNITY): Payer: Self-pay | Admitting: Emergency Medicine

## 2016-02-20 ENCOUNTER — Ambulatory Visit (HOSPITAL_COMMUNITY)
Admission: EM | Admit: 2016-02-20 | Discharge: 2016-02-20 | Disposition: A | Discharge status: Home / Self Care | Payer: Medicaid Other | Attending: Emergency Medicine | Admitting: Emergency Medicine

## 2016-02-20 DIAGNOSIS — L0291 Cutaneous abscess, unspecified: Secondary | ICD-10-CM | POA: Diagnosis not present

## 2016-02-20 MED ORDER — SULFAMETHOXAZOLE-TRIMETHOPRIM 800-160 MG PO TABS
1.0000 | ORAL_TABLET | Freq: Two times a day (BID) | ORAL | 0 refills | Status: DC
Start: 2016-02-20 — End: 2016-02-24

## 2016-02-20 MED ORDER — HYDROCODONE-ACETAMINOPHEN 5-325 MG PO TABS
2.0000 | ORAL_TABLET | ORAL | 0 refills | Status: DC | PRN
Start: 2016-02-20 — End: 2016-02-29

## 2016-02-20 MED ORDER — IBUPROFEN 800 MG PO TABS
800.0000 mg | ORAL_TABLET | Freq: Once | ORAL | Status: AC
  Administered 2016-02-20: 800 mg via ORAL

## 2016-02-20 MED ORDER — SULFAMETHOXAZOLE-TRIMETHOPRIM 800-160 MG PO TABS: 1.0000 | ORAL_TABLET | Freq: Two times a day (BID) | ORAL | 0 refills | Status: DC

## 2016-02-20 MED ORDER — HYDROCODONE-ACETAMINOPHEN 5-325 MG PO TABS
2.0000 | ORAL_TABLET | ORAL | 0 refills | Status: DC | PRN
Start: 2016-02-20 — End: 2016-02-24

## 2016-02-20 MED ORDER — IBUPROFEN 800 MG PO TABS
ORAL_TABLET | ORAL | Status: AC
  Filled 2016-02-20: qty 1

## 2016-02-20 NOTE — ED Provider Notes (Signed)
CSN: [id number]     Arrival date & time 02/20/16  1704 History   None    Chief Complaint  Patient presents with  . Abscess    bilateral buttocks   (Consider location/radiation/quality/duration/timing/severity/associated sxs/prior Treatment) The history is provided by the patient. No language interpreter was used.  Abscess  Location:  Torso Abscess quality: draining   Red streaking: yes   Progression:  Worsening Chronicity:  New Relieved by:  Nothing Worsened by:  Nothing Ineffective treatments:  None tried Associated symptoms: no fever and no vomiting   Pt reports she has had an abscess on her right buttock which is open and draining.  Pt concerned because she now has swelling to left buttuck.  Pt concerned infection has spread.  History reviewed. No pertinent past medical history. History reviewed. No pertinent surgical history. History reviewed. No pertinent family history. Social History  Substance Use Topics  . Smoking status: Current Every Day Smoker    Packs/day: 0.25    Types: Cigarettes  . Smokeless tobacco: Never Used  . Alcohol use Yes     Comment: occasional   OB History    No data available     Review of Systems  Constitutional: Negative for fever.  Gastrointestinal: Negative for vomiting.  All other systems reviewed and are negative.   Allergies  Percocet [oxycodone-acetaminophen] and Valium [diazepam]  Home Medications   Prior to Admission medications   Medication Sig Start Date End Date Taking? Authorizing Provider  calcium carbonate (TUMS - DOSED IN MG ELEMENTAL CALCIUM) 500 MG chewable tablet Chew 1 tablet by mouth daily. For heartburn, stomach ache    Historical Provider, MD  dicyclomine (BENTYL) 20 MG tablet Take 1 tablet (20 mg total) by mouth 3 (three) times daily as needed (abdominal cramps). 10/23/15   Ozella Almond Ward, PA-C  diphenhydrAMINE (BENADRYL) 25 MG tablet Take 1 tablet (25 mg total) by mouth every 6 (six) hours as needed for  itching. Patient not taking: Reported on 10/23/2015 12/20/14   Julianne Rice, MD  esomeprazole (NEXIUM) 40 MG capsule Take 40 mg by mouth daily.    Historical Provider, MD  HYDROcodone-acetaminophen (NORCO/VICODIN) 5-325 MG tablet Take 2 tablets by mouth every 4 (four) hours as needed. 02/20/16   Fransico Meadow, PA-C  sulfamethoxazole-trimethoprim (BACTRIM DS,SEPTRA DS) 800-160 MG tablet Take 1 tablet by mouth 2 (two) times daily. 02/20/16 02/27/16  Fransico Meadow, PA-C  sulfamethoxazole-trimethoprim (BACTRIM DS,SEPTRA DS) 800-160 MG tablet Take 1 tablet by mouth 2 (two) times daily. 02/20/16 02/27/16  Fransico Meadow, PA-C  sulfamethoxazole-trimethoprim (BACTRIM DS,SEPTRA DS) 800-160 MG tablet Take 1 tablet by mouth 2 (two) times daily. 02/20/16 02/27/16  Fransico Meadow, PA-C   Meds Ordered and Administered this Visit   Medications  ibuprofen (ADVIL,MOTRIN) tablet 800 mg (not administered)    BP 97/59 (BP Location: Right Arm)   Pulse 120   Temp 98.7 F (37.1 C) (Oral)   LMP 02/02/2016 (Approximate)   SpO2 100%  No data found.   Physical Exam  Constitutional: She appears well-developed and well-nourished.  HENT:  Head: Normocephalic.  Cardiovascular: Normal rate.   Pulmonary/Chest: Effort normal.  Abdominal: Soft.  Musculoskeletal: Normal range of motion.  Neurological: She is alert.  Skin: Skin is warm.  bilat buttocks erythema,  Open oozing area,  Area drains with pressure to either side,   Nursing note and vitals reviewed.   Urgent Care Course   Clinical Course     Procedures (including critical care time)  Labs Review Labs Reviewed - No data to display  Imaging Review No results found.   Visual Acuity Review  Right Eye Distance:   Left Eye Distance:   Bilateral Distance:    Right Eye Near:   Left Eye Near:    Bilateral Near:      I think whole area is open and draining.  I will start pt on Bactrim.  Pt given rx for hydrocodone.    MDM   1.  Abscess    An After Visit Summary was printed and given to the patient. Meds ordered this encounter  Medications  . HYDROcodone-acetaminophen (NORCO/VICODIN) 5-325 MG tablet    Sig: Take 2 tablets by mouth every 4 (four) hours as needed.    Dispense:  16 tablet    Refill:  0    Order Specific Question:   Supervising Provider    Answer:   Billy Fischer [phone]  . sulfamethoxazole-trimethoprim (BACTRIM DS,SEPTRA DS) 800-160 MG tablet    Sig: Take 1 tablet by mouth 2 (two) times daily.    Dispense:  14 tablet    Refill:  0    Order Specific Question:   Supervising Provider    Answer:   Billy Fischer [phone]  . ibuprofen (ADVIL,MOTRIN) tablet 800 mg  . sulfamethoxazole-trimethoprim (BACTRIM DS,SEPTRA DS) 800-160 MG tablet    Sig: Take 1 tablet by mouth 2 (two) times daily.    Dispense:  14 tablet    Refill:  0    Order Specific Question:   Supervising Provider    Answer:   Billy Fischer [phone]  . sulfamethoxazole-trimethoprim (BACTRIM DS,SEPTRA DS) 800-160 MG tablet    Sig: Take 1 tablet by mouth 2 (two) times daily.    Dispense:  14 tablet    Refill:  0    Order Specific Question:   Supervising Provider    Answer:   Ihor Gully D Tutuilla, PA-C 02/20/16 1840

## 2016-02-20 NOTE — ED Triage Notes (Addendum)
Pt has bilateral abscesses on her buttocks near her rectum.  The right side is draining and is very swollen.  She denies any fever, but states she is in severe pain.

## 2016-02-20 NOTE — Discharge Instructions (Signed)
Soak area 20 minutes 4 times a day °

## 2016-02-21 ENCOUNTER — Encounter (HOSPITAL_COMMUNITY): Payer: Self-pay

## 2016-02-21 ENCOUNTER — Emergency Department (HOSPITAL_COMMUNITY)
Admission: EM | Admit: 2016-02-21 | Discharge: 2016-02-21 | Disposition: A | Discharge status: Home / Self Care | Payer: Medicaid Other | Attending: Emergency Medicine | Admitting: Emergency Medicine

## 2016-02-21 DIAGNOSIS — E860 Dehydration: Secondary | ICD-10-CM | POA: Diagnosis not present

## 2016-02-21 DIAGNOSIS — Z79899 Other long term (current) drug therapy: Secondary | ICD-10-CM | POA: Diagnosis not present

## 2016-02-21 DIAGNOSIS — L0231 Cutaneous abscess of buttock: Secondary | ICD-10-CM | POA: Diagnosis present

## 2016-02-21 DIAGNOSIS — F17210 Nicotine dependence, cigarettes, uncomplicated: Secondary | ICD-10-CM | POA: Insufficient documentation

## 2016-02-21 LAB — I-STAT CHEM 8, ED
BUN: 4 mg/dL — ABNORMAL LOW (ref 6–20)
CALCIUM ION: 1.08 mmol/L — AB (ref 1.15–1.40)
Chloride: 99 mmol/L — ABNORMAL LOW (ref 101–111)
Creatinine, Ser: 0.7 mg/dL (ref 0.44–1.00)
Glucose, Bld: 98 mg/dL (ref 65–99)
HEMATOCRIT: 28 % — AB (ref 36.0–46.0)
Hemoglobin: 9.5 g/dL — ABNORMAL LOW (ref 12.0–15.0)
Potassium: 3.5 mmol/L (ref 3.5–5.1)
SODIUM: 138 mmol/L (ref 135–145)
TCO2: 26 mmol/L (ref 0–100)

## 2016-02-21 LAB — CBC WITH DIFFERENTIAL/PLATELET
BASOS ABS: 0 10*3/uL (ref 0.0–0.1)
BASOS PCT: 0 %
EOS ABS: 0 10*3/uL (ref 0.0–0.7)
Eosinophils Relative: 0 %
HCT: 27.3 % — ABNORMAL LOW (ref 36.0–46.0)
Hemoglobin: 9.1 g/dL — ABNORMAL LOW (ref 12.0–15.0)
Lymphocytes Relative: 4 %
Lymphs Abs: 1.1 10*3/uL (ref 0.7–4.0)
MCH: 27.2 pg (ref 26.0–34.0)
MCHC: 33.3 g/dL (ref 30.0–36.0)
MCV: 81.7 fL (ref 78.0–100.0)
MONO ABS: 1.3 10*3/uL — AB (ref 0.1–1.0)
Monocytes Relative: 5 %
NEUTROS ABS: 24.1 10*3/uL — AB (ref 1.7–7.7)
NEUTROS PCT: 91 %
PLATELETS: 384 10*3/uL (ref 150–400)
RBC: 3.34 MIL/uL — ABNORMAL LOW (ref 3.87–5.11)
RDW: 13.4 % (ref 11.5–15.5)
WBC: 26.5 10*3/uL — ABNORMAL HIGH (ref 4.0–10.5)

## 2016-02-21 MED ORDER — ONDANSETRON 4 MG PO TBDP
ORAL_TABLET | ORAL | Status: AC
  Filled 2016-02-21: qty 1

## 2016-02-21 MED ORDER — HYDROMORPHONE HCL 2 MG/ML IJ SOLN
1.0000 mg | Freq: Once | INTRAMUSCULAR | Status: AC
  Administered 2016-02-21: 1 mg via INTRAVENOUS
  Filled 2016-02-21: qty 1

## 2016-02-21 MED ORDER — ONDANSETRON 4 MG PO TBDP: 4.0000 mg | ORAL_TABLET | Freq: Three times a day (TID) | ORAL | 0 refills | Status: DC | PRN

## 2016-02-21 MED ORDER — KETAMINE HCL-SODIUM CHLORIDE 20-0.9 MG/2ML-% IV SOSY: 0.3000 mg/kg | PREFILLED_SYRINGE | Freq: Once | INTRAVENOUS | Status: DC

## 2016-02-21 MED ORDER — SODIUM CHLORIDE 0.9 % IV BOLUS (SEPSIS)
1000.0000 mL | Freq: Once | INTRAVENOUS | Status: AC
  Administered 2016-02-21: 1000 mL via INTRAVENOUS

## 2016-02-21 MED ORDER — LIDOCAINE-EPINEPHRINE (PF) 2 %-1:200000 IJ SOLN
10.0000 mL | Freq: Once | INTRAMUSCULAR | Status: AC
  Administered 2016-02-21: 10 mL
  Filled 2016-02-21: qty 20

## 2016-02-21 MED ORDER — KETAMINE HCL-SODIUM CHLORIDE 100-0.9 MG/10ML-% IV SOSY
0.3000 mg/kg | PREFILLED_SYRINGE | Freq: Once | INTRAVENOUS | Status: AC
  Administered 2016-02-21: 16 mg via INTRAVENOUS
  Filled 2016-02-21: qty 10

## 2016-02-21 MED ORDER — ONDANSETRON 4 MG PO TBDP
4.0000 mg | ORAL_TABLET | Freq: Once | ORAL | Status: AC | PRN
  Administered 2016-02-21: 4 mg via ORAL

## 2016-02-21 NOTE — ED Notes (Signed)
Papers reviewed and IV removed. Medications discussed and and patient verbalizes understanding

## 2016-02-21 NOTE — ED Notes (Signed)
Given a happy meal and orange juice

## 2016-02-21 NOTE — ED Triage Notes (Addendum)
Pt presents for reevaluation of abscess to buttocks near rectum. Pt. States was seen at [facility] yesterday for same and given abx and hydrocodone. Pt. States has been vomiting today, states she thinks hydrocodone made her sick. Pt. Denies fever. Pt reports she took 1 hydrocodone this AM around 0700.

## 2016-02-21 NOTE — ED Provider Notes (Addendum)
[facility] DEPT Provider Note   CSN: [id number] Arrival date & time: 02/21/16  6333     History   Chief Complaint Chief Complaint  Patient presents with  . Abscess  . Emesis    HPI Nicole Browning is a 45 y.o. female.  Patient is a 45 year old healthy female presenting today with abscess of the buttocks. She states it initially started 1 week ago on the left inner buttocks which she started doing warm soaks and drainage started however she then started noticing pain and swelling in the right buttocks which is only worsened. She states the pain is now 10 out of 10 and she is unable to sit down. She's continued to have drainage from the left buttocks but no drainage from the right. She's continue doing warm soaks without improvement. She denies any fever but was seen at urgent care yesterday and was given Bactrim and hydrocodone. Since taking the hydrocodone she's been vomiting all night long. She was unable to hold down the antibiotic. She came here for further evaluation and is requesting I&D of the abscess.   The history is provided by the patient.  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    History reviewed. No pertinent past medical history.  There are no active problems to display for this patient.   History reviewed. No pertinent  surgical history.  OB History    No data available       Home Medications    Prior to Admission medications   Medication Sig Start Date End Date Taking? Authorizing Provider  calcium carbonate (TUMS - DOSED IN MG ELEMENTAL CALCIUM) 500 MG chewable tablet Chew 1 tablet by mouth daily. For heartburn, stomach ache    Historical Provider, MD  dicyclomine (BENTYL) 20 MG tablet Take 1 tablet (20 mg total) by mouth 3 (three) times daily as needed (abdominal cramps). 10/23/15   Ozella Almond Ward, PA-C  diphenhydrAMINE (BENADRYL) 25 MG tablet Take 1 tablet (25 mg total) by mouth every 6 (six) hours as needed for itching. Patient not taking: Reported on 10/23/2015 12/20/14   Julianne Rice, MD  esomeprazole (NEXIUM) 40 MG capsule Take 40 mg by mouth daily.    Historical Provider, MD  HYDROcodone-acetaminophen (NORCO/VICODIN) 5-325 MG tablet Take 2 tablets by mouth every 4 (four) hours as needed. 02/20/16   Fransico Meadow, PA-C  HYDROcodone-acetaminophen (NORCO/VICODIN) 5-325 MG tablet Take 2 tablets by mouth every 4 (four) hours as needed. 02/20/16   Fransico Meadow, PA-C  sulfamethoxazole-trimethoprim (BACTRIM DS,SEPTRA DS) 800-160 MG tablet Take 1 tablet by mouth 2 (two) times daily. 02/20/16 02/27/16  Fransico Meadow, PA-C  sulfamethoxazole-trimethoprim (BACTRIM DS,SEPTRA DS) 800-160 MG tablet Take 1 tablet by mouth 2 (two) times daily. 02/20/16 02/27/16  Fransico Meadow, PA-C  sulfamethoxazole-trimethoprim (BACTRIM DS,SEPTRA DS) 800-160 MG tablet Take 1 tablet by  mouth 2 (two) times daily. 02/20/16 02/27/16  Fransico Meadow, PA-C    Family History No family history on file.  Social History Social History  Substance Use Topics  . Smoking status: Current Every Day Smoker    Packs/day: 0.25    Types: Cigarettes  . Smokeless tobacco: Never Used  . Alcohol use Yes     Comment: occasional     Allergies   Percocet [oxycodone-acetaminophen] and Valium [diazepam]   Review of Systems Review  of Systems  Constitutional: Negative for fever.  Gastrointestinal: Positive for anorexia, nausea and vomiting.  All other systems reviewed and are negative.    Physical Exam Updated Vital Signs BP 100/59 (BP Location: Left Arm)   Pulse 110   Temp 97.7 F (36.5 C) (Oral)   Resp 16   Ht 5' 7"  (1.702 m)   Wt 120 lb (54.4 kg)   LMP 02/02/2016 (Approximate)   SpO2 99%   BMI 18.79 kg/m   Physical Exam  Constitutional: She is oriented to person, place, and time. She appears well-developed and well-nourished. She appears distressed.  Appears uncomfortable  HENT:  Head: Normocephalic and atraumatic.  Mouth/Throat: Oropharynx is clear and moist.  Eyes: Conjunctivae and EOM are normal. Pupils are equal, round, and reactive to light.  Neck: Normal range of motion. Neck supple.  Cardiovascular: Regular rhythm and intact distal pulses.  Tachycardia present.   No murmur heard. Pulmonary/Chest: Effort normal and breath sounds normal. No respiratory distress. She has no wheezes. She has no rales.  Abdominal: Soft. She exhibits no distension. There is no tenderness. There is no rebound and no guarding.  Genitourinary:     Musculoskeletal: Normal range of motion. She exhibits no edema or tenderness.  Neurological: She is alert and oriented to person, place, and time.  Skin: Skin is warm and dry. No rash noted. No erythema.  Psychiatric: She has a normal mood and affect. Her behavior is normal.  Nursing note and vitals reviewed.    ED Treatments / Results  Labs (all labs ordered are listed, but only abnormal results are displayed) Labs Reviewed  CBC WITH DIFFERENTIAL/PLATELET - Abnormal; Notable for the following:       Result Value   WBC 26.5 (*)    RBC 3.34 (*)    Hemoglobin 9.1 (*)    HCT 27.3 (*)    Neutro Abs 24.1 (*)    Monocytes Absolute 1.3 (*)    All other components within normal limits  I-STAT CHEM 8, ED - Abnormal; Notable for the following:    Chloride 99 (*)     BUN 4 (*)    Calcium, Ion 1.08 (*)    Hemoglobin 9.5 (*)    HCT 28.0 (*)    All other components within normal limits    EKG  EKG Interpretation None       Radiology No results found.  Procedures Procedures (including critical care time)  Medications Ordered in ED Medications  ondansetron (ZOFRAN-ODT) 4 MG disintegrating tablet (not administered)  HYDROmorphone (DILAUDID) injection 1 mg (not administered)  sodium chloride 0.9 % bolus 1,000 mL (not administered)  lidocaine-EPINEPHrine (XYLOCAINE W/EPI) 2 %-1:200000 (PF) injection 10 mL (not administered)  ketamine 20 mg in normal saline 2 mL (17m/mL) syringe (not administered)  ondansetron (ZOFRAN-ODT) disintegrating tablet 4 mg (4 mg Oral Given 02/21/16 08786     Initial Impression / Assessment and Plan / ED Course  I have reviewed the triage vital signs and the nursing notes.  Pertinent labs & imaging results that were available during my care of the patient were reviewed by me and considered in my medical decision making (see chart for details).  Clinical Course    Patient presenting today with large mass that does not seem to involve the rectum.  Symptoms have been worsening over the last 1 week. Initially started on the left buttocks with drainage of but now has an area on the right buttocks as well. Does not seem to be of labial origin. Patient denies systemic symptoms but after being seen at urgent care yesterday she was given Bactrim and hydrocodone. After taking the hydrocodone she has vomited all night. She is mildly dehydrated on exam but denies fever or other systemic symptoms. Bedside ultrasound localized area of abscess. Patient given IV fluids, pain medication and analgesic ketamine for I&D. Labs are consistent with a white blood cell count of 26,000 with a stable hemoglobin and normal creatinine.  Large amount of pus drained from the abscess. Again hemorrhoids are nontender and non-indurated. Repeat exam of the  rectum no fluctuance felt to nontender on rectal exam. Patient is otherwise healthy and feels much better after drainage. Feel that is reasonable for patient to go home continue oral antibiotics and soaks. Stressed the importance of recheck in 2 days or sooner if symptoms worsen.  1:34 PM Pulse improved.  Pt tolerating po's.  Ready for d/c.  INCISION AND DRAINAGE Performed by: Blanchie Dessert Consent: Verbal consent obtained. Risks and benefits: risks, benefits and alternatives were discussed Type: abscess  Body area: right buttocks  Anesthesia: local infiltration  Incision was made with a scalpel.  Local anesthetic: lidocaine 2% with epinephrine  Anesthetic total: 10 ml  Complexity: complex Blunt dissection to break up loculations  Drainage: purulent  Drainage amount: >20m  Packing material: 1/4 in iodoform gauze  Patient tolerance: Patient tolerated the procedure well with no immediate complications.  EMERGENCY DEPARTMENT [place] INTERPRETATION "Study: Limited Ultrasound of the noted body part in comments below"  INDICATIONS: Soft tissue infection Multiple views of the body part are obtained with a multi-frequency linear probe  PERFORMED BY:  Myself  IMAGES ARCHIVED?: No  SIDE:Left  BODY PART:Other soft tisse (comment in note)  FINDINGS: Abcess present  LIMITATIONS:  none  INTERPRETATION:  Abcess present  COMMENT:  Left buttocks abscess     Final Clinical Impressions(s) / ED Diagnoses   Final diagnoses:  Abscess of buttock, left    New Prescriptions New Prescriptions   ONDANSETRON (ZOFRAN ODT) 4 MG DISINTEGRATING TABLET    Take 1 tablet (4 mg total) by mouth every 8 (eight) hours as needed for nausea or vomiting.     WBlanchie Dessert MD 02/21/16 [place] MD 02/27/16 2030

## 2016-02-24 ENCOUNTER — Encounter (HOSPITAL_COMMUNITY): Admission: EM | Disposition: A | Discharge status: Home / Self Care | Payer: Self-pay | Source: Home / Self Care

## 2016-02-24 ENCOUNTER — Emergency Department (HOSPITAL_COMMUNITY): Payer: Medicaid Other

## 2016-02-24 ENCOUNTER — Emergency Department (HOSPITAL_COMMUNITY): Payer: Medicaid Other | Admitting: Certified Registered Nurse Anesthetist

## 2016-02-24 ENCOUNTER — Inpatient Hospital Stay (HOSPITAL_COMMUNITY)
Admission: EM | Admit: 2016-02-24 | Discharge: 2016-02-29 | DRG: 394 | Disposition: A | Discharge status: Home / Self Care | Payer: Medicaid Other | Attending: General Surgery | Admitting: General Surgery

## 2016-02-24 ENCOUNTER — Encounter (HOSPITAL_COMMUNITY): Payer: Self-pay | Admitting: Emergency Medicine

## 2016-02-24 DIAGNOSIS — K613 Ischiorectal abscess: Principal | ICD-10-CM | POA: Diagnosis present

## 2016-02-24 DIAGNOSIS — R7309 Other abnormal glucose: Secondary | ICD-10-CM | POA: Diagnosis present

## 2016-02-24 DIAGNOSIS — Z720 Tobacco use: Secondary | ICD-10-CM | POA: Diagnosis present

## 2016-02-24 DIAGNOSIS — Z79899 Other long term (current) drug therapy: Secondary | ICD-10-CM

## 2016-02-24 DIAGNOSIS — K611 Rectal abscess: Secondary | ICD-10-CM

## 2016-02-24 DIAGNOSIS — L03317 Cellulitis of buttock: Secondary | ICD-10-CM | POA: Diagnosis present

## 2016-02-24 DIAGNOSIS — E440 Moderate protein-calorie malnutrition: Secondary | ICD-10-CM | POA: Diagnosis present

## 2016-02-24 DIAGNOSIS — Z888 Allergy status to other drugs, medicaments and biological substances status: Secondary | ICD-10-CM

## 2016-02-24 DIAGNOSIS — K219 Gastro-esophageal reflux disease without esophagitis: Secondary | ICD-10-CM | POA: Diagnosis present

## 2016-02-24 DIAGNOSIS — R7303 Prediabetes: Secondary | ICD-10-CM

## 2016-02-24 DIAGNOSIS — E119 Type 2 diabetes mellitus without complications: Secondary | ICD-10-CM | POA: Diagnosis present

## 2016-02-24 DIAGNOSIS — D638 Anemia in other chronic diseases classified elsewhere: Secondary | ICD-10-CM | POA: Diagnosis present

## 2016-02-24 DIAGNOSIS — F17210 Nicotine dependence, cigarettes, uncomplicated: Secondary | ICD-10-CM | POA: Diagnosis present

## 2016-02-24 DIAGNOSIS — E46 Unspecified protein-calorie malnutrition: Secondary | ICD-10-CM | POA: Diagnosis present

## 2016-02-24 DIAGNOSIS — Z885 Allergy status to narcotic agent status: Secondary | ICD-10-CM

## 2016-02-24 DIAGNOSIS — Z833 Family history of diabetes mellitus: Secondary | ICD-10-CM

## 2016-02-24 DIAGNOSIS — K5900 Constipation, unspecified: Secondary | ICD-10-CM | POA: Diagnosis present

## 2016-02-24 HISTORY — DX: Unspecified protein-calorie malnutrition: E46

## 2016-02-24 HISTORY — DX: Tobacco use: Z72.0

## 2016-02-24 HISTORY — DX: Anemia, unspecified: D64.9

## 2016-02-24 HISTORY — DX: Gastro-esophageal reflux disease without esophagitis: K21.9

## 2016-02-24 HISTORY — DX: Rectal abscess: K61.1

## 2016-02-24 HISTORY — PX: IRRIGATION AND DEBRIDEMENT ABSCESS: SHX5252

## 2016-02-24 LAB — CBC WITH DIFFERENTIAL/PLATELET
Basophils Absolute: 0 10*3/uL (ref 0.0–0.1)
Basophils Relative: 0 %
EOS PCT: 1 %
Eosinophils Absolute: 0.1 10*3/uL (ref 0.0–0.7)
HCT: 23.4 % — ABNORMAL LOW (ref 36.0–46.0)
Hemoglobin: 8.1 g/dL — ABNORMAL LOW (ref 12.0–15.0)
Lymphocytes Relative: 21 %
Lymphs Abs: 1.8 10*3/uL (ref 0.7–4.0)
MCH: 27.5 pg (ref 26.0–34.0)
MCHC: 34.6 g/dL (ref 30.0–36.0)
MCV: 79.3 fL (ref 78.0–100.0)
MONO ABS: 0.8 10*3/uL (ref 0.1–1.0)
MONOS PCT: 9 %
NEUTROS PCT: 69 %
Neutro Abs: 5.9 10*3/uL (ref 1.7–7.7)
PLATELETS: 368 10*3/uL (ref 150–400)
RBC: 2.95 MIL/uL — ABNORMAL LOW (ref 3.87–5.11)
RDW: 13.6 % (ref 11.5–15.5)
WBC: 8.6 10*3/uL (ref 4.0–10.5)

## 2016-02-24 LAB — COMPREHENSIVE METABOLIC PANEL
ALK PHOS: 66 U/L (ref 38–126)
ALT: 12 U/L — AB (ref 14–54)
AST: 17 U/L (ref 15–41)
Albumin: 1.8 g/dL — ABNORMAL LOW (ref 3.5–5.0)
Anion gap: 9 (ref 5–15)
BUN: 5 mg/dL — AB (ref 6–20)
CALCIUM: 8.4 mg/dL — AB (ref 8.9–10.3)
CO2: 27 mmol/L (ref 22–32)
CREATININE: 0.75 mg/dL (ref 0.44–1.00)
Chloride: 101 mmol/L (ref 101–111)
GFR calc non Af Amer: >60 mL/min (ref >60)
GLUCOSE: 123 mg/dL — AB (ref 65–99)
Potassium: 2.9 mmol/L — ABNORMAL LOW (ref 3.5–5.1)
SODIUM: 137 mmol/L (ref 135–145)
Total Bilirubin: 0.6 mg/dL (ref 0.3–1.2)
Total Protein: 6.1 g/dL — ABNORMAL LOW (ref 6.5–8.1)

## 2016-02-24 LAB — TYPE AND SCREEN
ABO/RH(D): O POS
ANTIBODY SCREEN: NEGATIVE

## 2016-02-24 LAB — I-STAT CG4 LACTIC ACID, ED
Lactic Acid, Venous: 2.19 mmol/L (ref 0.5–1.9)
Lactic Acid, Venous: 0.98 mmol/L (ref 0.5–1.9)

## 2016-02-24 LAB — PROTIME-INR
INR: 1.19
Prothrombin Time: 15.2 seconds (ref 11.4–15.2)

## 2016-02-24 LAB — ABO/RH: ABO/RH(D): O POS

## 2016-02-24 IMAGING — CT CT PELVIS W/ CM
2 of 3 series · 17 of 46 positions shown, 19 images · IV contrast (Omni 300)
Comparison: None.

CLINICAL DATA: Left buttock abscess, status post I&D

EXAM:
CT PELVIS WITH CONTRAST
TECHNIQUE: Multidetector CT imaging of the pelvis was performed using the
standard protocol following the bolus administration of intravenous
contrast.
CONTRAST:  100mL [4C] IOPAMIDOL ([4C]) INJECTION 61%

[Series 2: pelvis with 5.0 · axial · 0.67mm/px · z∈[+792,+1062]mm · 14 of 63 slices shown, 16 images]
[im 5/63  soft-tissue]
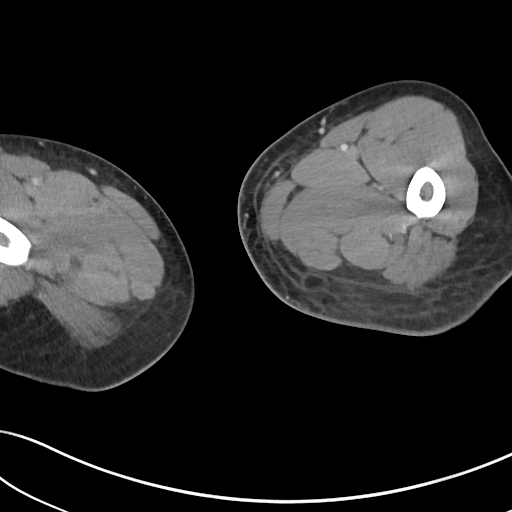
[im 5/63  bone]
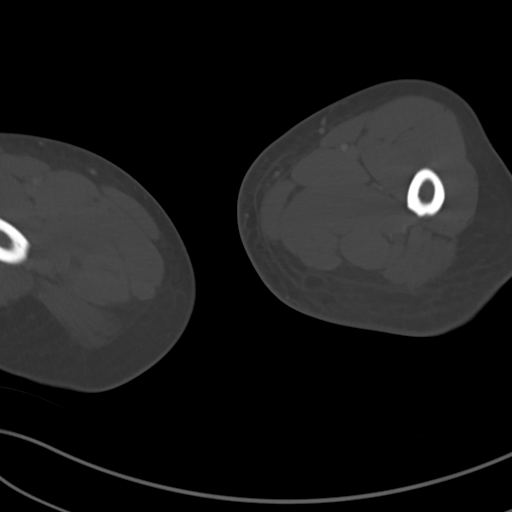
[im 9/63  soft-tissue]
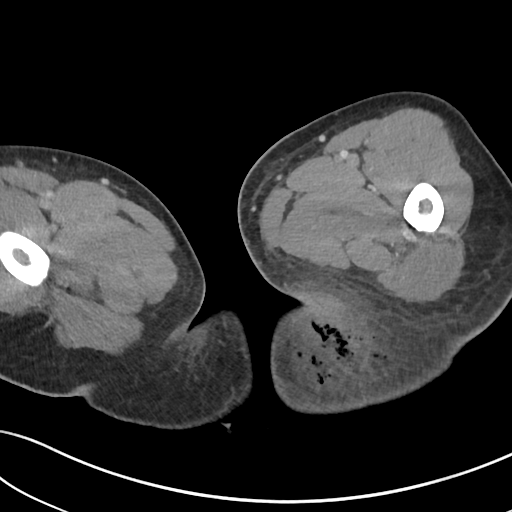
[im 13/63  soft-tissue]
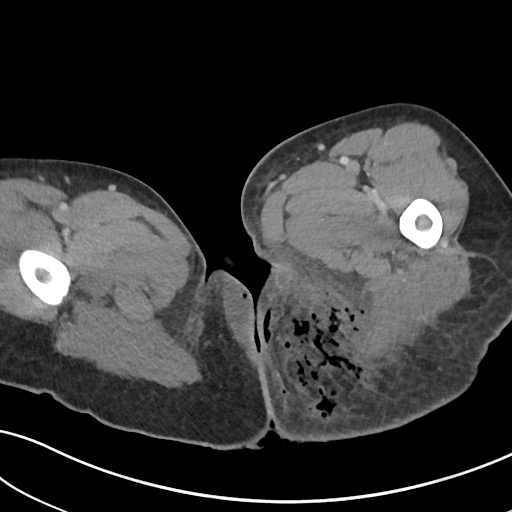
[im 17/63  soft-tissue]
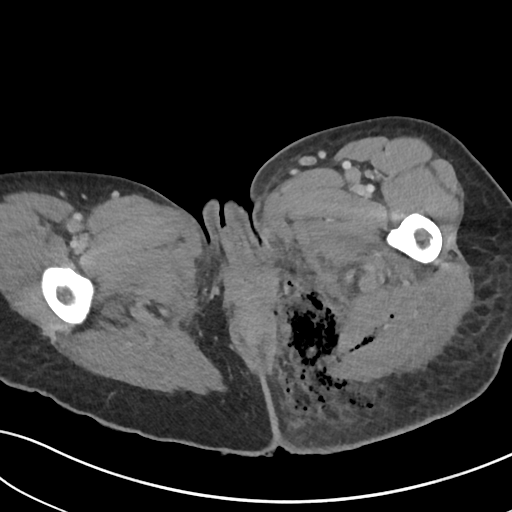
[im 21/63  soft-tissue]
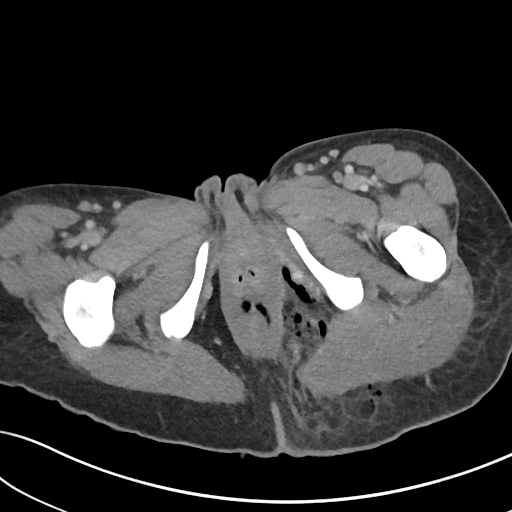
[im 25/63  soft-tissue]
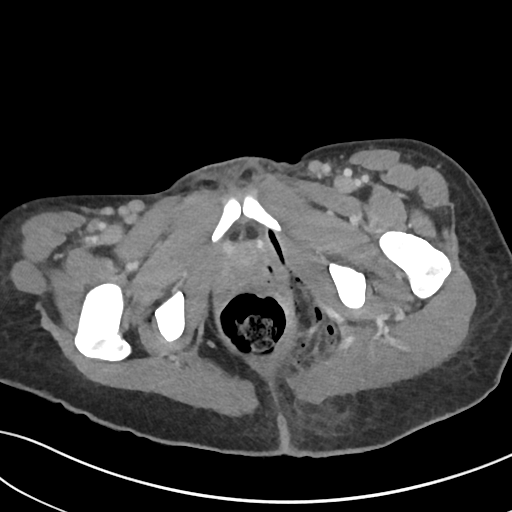
[im 29/63  soft-tissue]
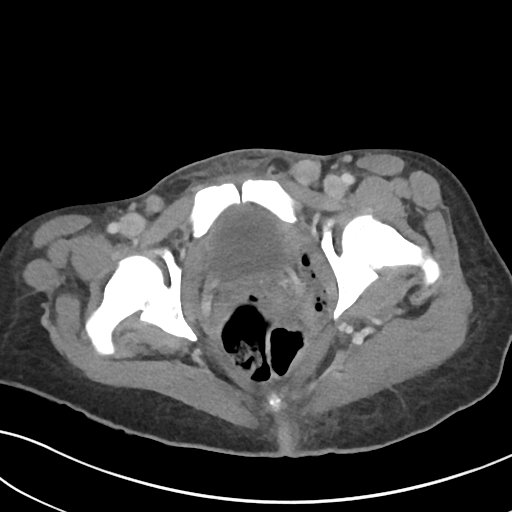
[im 35/63  soft-tissue]
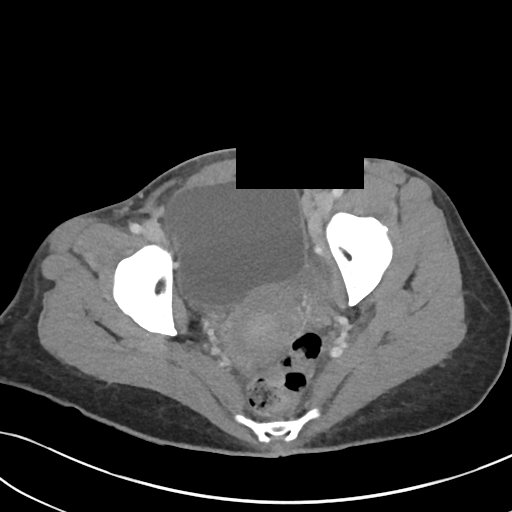
[im 39/63  soft-tissue]
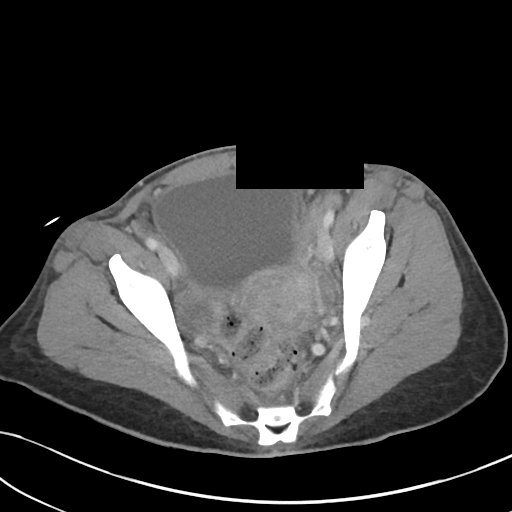
[im 39/63  bone]
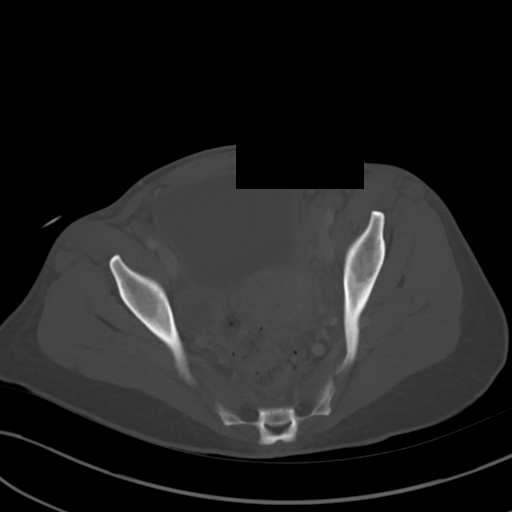
[im 43/63  soft-tissue]
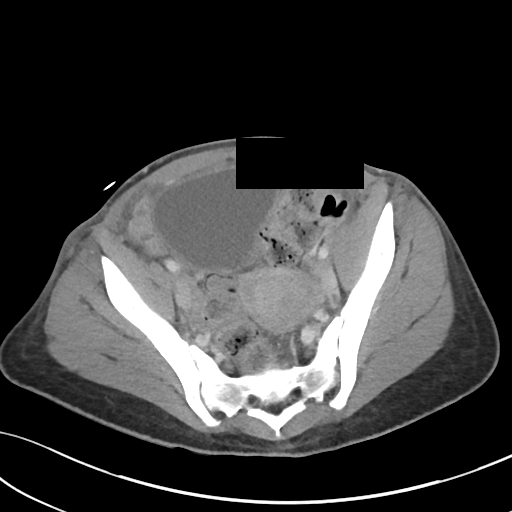
[im 47/63  soft-tissue]
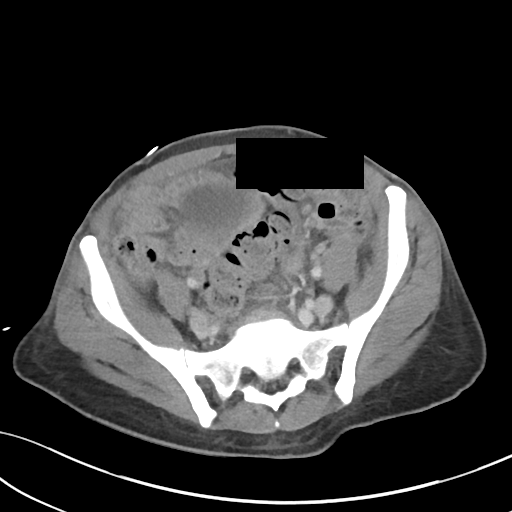
[im 51/63  soft-tissue]
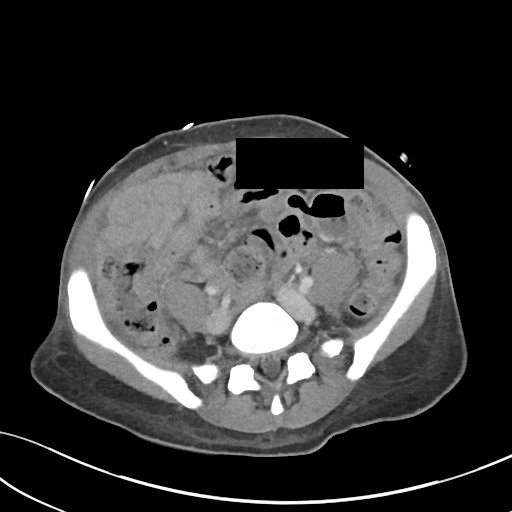
[im 55/63  soft-tissue]
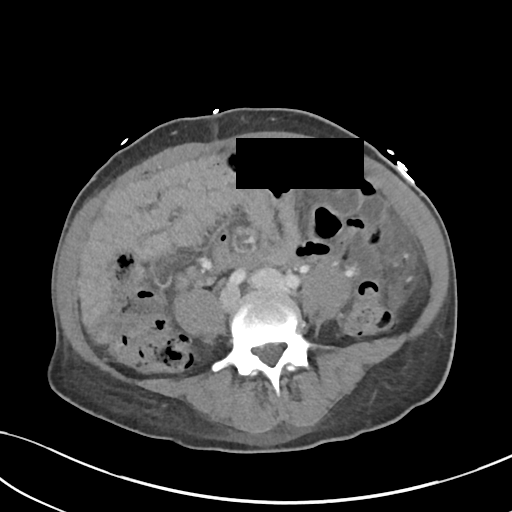
[im 59/63  soft-tissue]
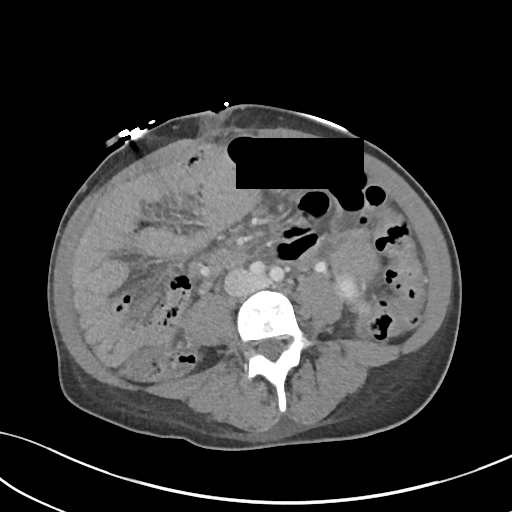

[Series 4: pelvis with 2.0 cor · coronal · 0.59mm/px · 3 of 149 slices shown]
[im 50/149  soft-tissue]
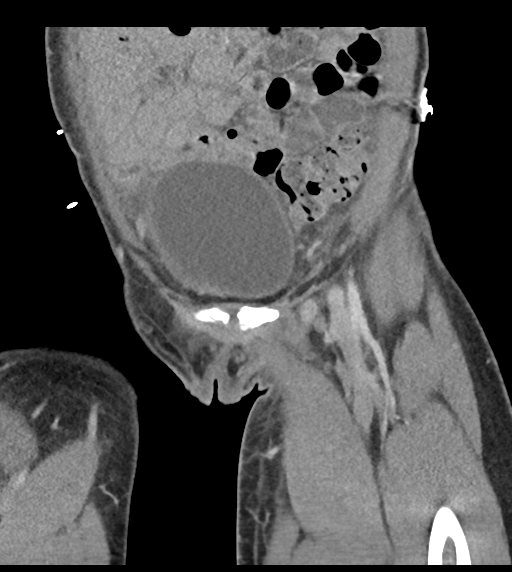
[im 66/149  soft-tissue]
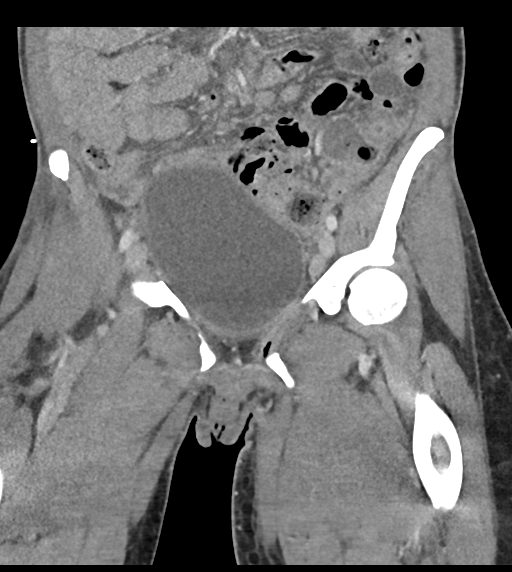
[im 83/149  soft-tissue]
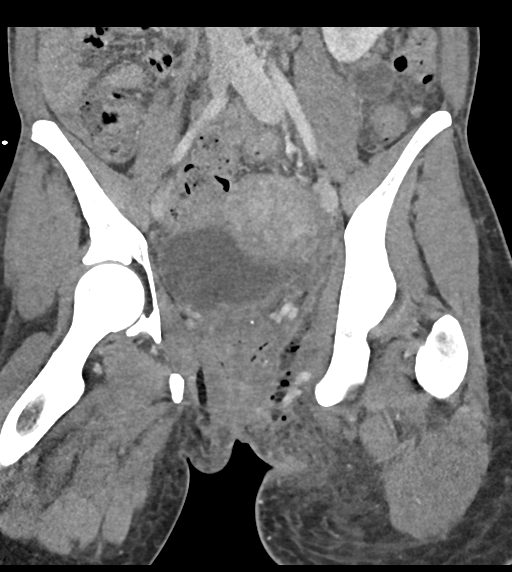

[17 of 46 positions shown; findings below may reference images not displayed]

FINDINGS: Urinary Tract:  Bladder is within normal limits.

Bowel:  Visualized bowel is unremarkable.

Vascular/Lymphatic: Iliac vessels are within normal limits.

No suspicious pelvic lymphadenopathy.

Reproductive:  Uterus is within normal limits.

Bilateral ovaries are within normal limits.

Other:  No pelvic ascites.

Subcutaneous gas in the left ischiorectal fossa (series 2/ image
43), left medial gluteal region (series 2/ image 51), and along the
medial aspect of the gluteus major muscle (series 2/ image 45).
Additional soft tissue gas extends into the left obturator fossa
(series 2/ image 35). This is likely iatrogenic, related to recent
procedure.

No residual fluid collection/abscess is evident within the soft
tissues on the left.

4.1 x 2.0 x 2.2 cm subcutaneous fluid collection along the right
anterior gluteal fold (series 2/ image 52; series 4/ image 94).

Musculoskeletal: Visualized osseous structures are within normal
limits.
IMPRESSION: Soft tissue gas extending from the left medial gluteal region into
the left ischiorectal fossa and left obturator region, likely
related to recent procedure.

No residual fluid collection/abscess on the left.

4.1 x 2.0 x 2.2 cm subcutaneous fluid collection along the right
anterior gluteal fold, abscess not excluded.

## 2016-02-24 SURGERY — IRRIGATION AND DEBRIDEMENT ABSCESS
Anesthesia: General | Site: Rectum

## 2016-02-24 MED ORDER — FENTANYL CITRATE (PF) 100 MCG/2ML IJ SOLN
INTRAMUSCULAR | Status: DC | PRN
  Administered 2016-02-24: 100 ug via INTRAVENOUS
  Administered 2016-02-24: 50 ug via INTRAVENOUS
  Administered 2016-02-24: 100 ug via INTRAVENOUS
  Administered 2016-02-24: 50 ug via INTRAVENOUS

## 2016-02-24 MED ORDER — ONDANSETRON HCL 4 MG/2ML IJ SOLN
4.0000 mg | Freq: Four times a day (QID) | INTRAMUSCULAR | Status: DC | PRN
  Administered 2016-02-25 – 2016-02-28 (×3): 4 mg via INTRAVENOUS
  Filled 2016-02-24 (×3): qty 2

## 2016-02-24 MED ORDER — PIPERACILLIN-TAZOBACTAM 3.375 G IVPB
3.3750 g | Freq: Three times a day (TID) | INTRAVENOUS | Status: DC
  Administered 2016-02-25 – 2016-02-29 (×13): 3.375 g via INTRAVENOUS
  Filled 2016-02-24 (×15): qty 50

## 2016-02-24 MED ORDER — VANCOMYCIN HCL IN DEXTROSE 1-5 GM/200ML-% IV SOLN
1000.0000 mg | Freq: Once | INTRAVENOUS | Status: AC
  Administered 2016-02-24: 1000 mg via INTRAVENOUS
  Filled 2016-02-24: qty 200

## 2016-02-24 MED ORDER — SODIUM CHLORIDE 0.9 % IV SOLN
Freq: Once | INTRAVENOUS | Status: AC
  Administered 2016-02-24: 12:00:00 via INTRAVENOUS
  Filled 2016-02-24: qty 1000

## 2016-02-24 MED ORDER — PIPERACILLIN-TAZOBACTAM 3.375 G IVPB 30 MIN
3.3750 g | Freq: Once | INTRAVENOUS | Status: AC
  Administered 2016-02-24: 3.375 g via INTRAVENOUS
  Filled 2016-02-24: qty 50

## 2016-02-24 MED ORDER — SODIUM CHLORIDE 0.9 % IV SOLN
INTRAVENOUS | Status: DC | PRN
  Administered 2016-02-24 (×2): via INTRAVENOUS

## 2016-02-24 MED ORDER — HYDROMORPHONE HCL 1 MG/ML IJ SOLN
INTRAMUSCULAR | Status: AC
  Filled 2016-02-24: qty 0.5

## 2016-02-24 MED ORDER — FENTANYL CITRATE (PF) 100 MCG/2ML IJ SOLN
50.0000 ug | Freq: Once | INTRAMUSCULAR | Status: AC
  Administered 2016-02-24: 50 ug via INTRAVENOUS
  Filled 2016-02-24: qty 2

## 2016-02-24 MED ORDER — HYDROMORPHONE HCL 1 MG/ML IJ SOLN
0.2500 mg | INTRAMUSCULAR | Status: DC | PRN
  Administered 2016-02-24 (×2): 0.5 mg via INTRAVENOUS

## 2016-02-24 MED ORDER — KETOROLAC TROMETHAMINE 30 MG/ML IJ SOLN
30.0000 mg | Freq: Four times a day (QID) | INTRAMUSCULAR | Status: DC | PRN
  Administered 2016-02-26 – 2016-02-27 (×3): 30 mg via INTRAVENOUS
  Filled 2016-02-24 (×3): qty 1

## 2016-02-24 MED ORDER — ACETAMINOPHEN 325 MG PO TABS
650.0000 mg | ORAL_TABLET | Freq: Four times a day (QID) | ORAL | Status: DC | PRN
  Administered 2016-02-25: 650 mg via ORAL
  Filled 2016-02-24: qty 2

## 2016-02-24 MED ORDER — FENTANYL CITRATE (PF) 100 MCG/2ML IJ SOLN
INTRAMUSCULAR | Status: AC
  Filled 2016-02-24: qty 4

## 2016-02-24 MED ORDER — ENOXAPARIN SODIUM 40 MG/0.4ML ~~LOC~~ SOLN
40.0000 mg | SUBCUTANEOUS | Status: DC
  Administered 2016-02-25 – 2016-02-28 (×4): 40 mg via SUBCUTANEOUS
  Filled 2016-02-24 (×4): qty 0.4

## 2016-02-24 MED ORDER — MORPHINE SULFATE (PF) 4 MG/ML IV SOLN
4.0000 mg | Freq: Once | INTRAVENOUS | Status: AC
  Administered 2016-02-24: 4 mg via INTRAVENOUS
  Filled 2016-02-24: qty 1

## 2016-02-24 MED ORDER — SODIUM CHLORIDE 0.9 % IV BOLUS (SEPSIS)
1000.0000 mL | Freq: Once | INTRAVENOUS | Status: AC
  Administered 2016-02-24: 1000 mL via INTRAVENOUS

## 2016-02-24 MED ORDER — IOPAMIDOL (ISOVUE-300) INJECTION 61%
INTRAVENOUS | Status: AC
  Administered 2016-02-24: 100 mL
  Filled 2016-02-24: qty 100

## 2016-02-24 MED ORDER — HYDROCODONE-ACETAMINOPHEN 5-325 MG PO TABS
1.0000 | ORAL_TABLET | ORAL | Status: DC | PRN
  Administered 2016-02-25 – 2016-02-29 (×15): 2 via ORAL
  Filled 2016-02-24 (×15): qty 2

## 2016-02-24 MED ORDER — DEXAMETHASONE SODIUM PHOSPHATE 4 MG/ML IJ SOLN
INTRAMUSCULAR | Status: DC | PRN
  Administered 2016-02-24: 10 mg via INTRAVENOUS

## 2016-02-24 MED ORDER — ACETAMINOPHEN 650 MG RE SUPP: 650.0000 mg | Freq: Four times a day (QID) | RECTAL | Status: DC | PRN

## 2016-02-24 MED ORDER — DIPHENHYDRAMINE HCL 12.5 MG/5ML PO ELIX: 12.5000 mg | ORAL_SOLUTION | Freq: Four times a day (QID) | ORAL | Status: DC | PRN

## 2016-02-24 MED ORDER — VANCOMYCIN HCL IN DEXTROSE 1-5 GM/200ML-% IV SOLN: 1000.0000 mg | Freq: Once | INTRAVENOUS | Status: DC

## 2016-02-24 MED ORDER — ONDANSETRON 4 MG PO TBDP: 4.0000 mg | ORAL_TABLET | Freq: Four times a day (QID) | ORAL | Status: DC | PRN

## 2016-02-24 MED ORDER — SIMETHICONE 80 MG PO CHEW: 40.0000 mg | CHEWABLE_TABLET | Freq: Four times a day (QID) | ORAL | Status: DC | PRN

## 2016-02-24 MED ORDER — ONDANSETRON HCL 4 MG/2ML IJ SOLN
4.0000 mg | Freq: Once | INTRAMUSCULAR | Status: AC
  Administered 2016-02-24: 4 mg via INTRAVENOUS
  Filled 2016-02-24: qty 2

## 2016-02-24 MED ORDER — PIPERACILLIN-TAZOBACTAM 3.375 G IVPB 30 MIN: 3.3750 g | Freq: Once | INTRAVENOUS | Status: DC

## 2016-02-24 MED ORDER — KETOROLAC TROMETHAMINE 30 MG/ML IJ SOLN
30.0000 mg | Freq: Four times a day (QID) | INTRAMUSCULAR | Status: AC
  Administered 2016-02-24 – 2016-02-25 (×4): 30 mg via INTRAVENOUS
  Filled 2016-02-24 (×4): qty 1

## 2016-02-24 MED ORDER — SODIUM CHLORIDE 0.9 % IV SOLN
Freq: Once | INTRAVENOUS | Status: AC
  Administered 2016-02-24: 15:00:00 via INTRAVENOUS

## 2016-02-24 MED ORDER — HYDROMORPHONE HCL 2 MG/ML IJ SOLN
0.5000 mg | INTRAMUSCULAR | Status: DC | PRN
  Administered 2016-02-25: 1 mg via INTRAVENOUS
  Administered 2016-02-26 – 2016-02-28 (×2): 2 mg via INTRAVENOUS
  Filled 2016-02-24 (×3): qty 1

## 2016-02-24 MED ORDER — SUCCINYLCHOLINE CHLORIDE 20 MG/ML IJ SOLN
INTRAMUSCULAR | Status: DC | PRN
  Administered 2016-02-24: 100 mg via INTRAVENOUS

## 2016-02-24 MED ORDER — BISACODYL 10 MG RE SUPP: 10.0000 mg | Freq: Every day | RECTAL | Status: DC | PRN

## 2016-02-24 MED ORDER — FENTANYL CITRATE (PF) 100 MCG/2ML IJ SOLN
INTRAMUSCULAR | Status: AC
  Filled 2016-02-24: qty 2

## 2016-02-24 MED ORDER — VANCOMYCIN HCL 500 MG IV SOLR
500.0000 mg | Freq: Three times a day (TID) | INTRAVENOUS | Status: DC
  Administered 2016-02-25 – 2016-02-27 (×7): 500 mg via INTRAVENOUS
  Filled 2016-02-24 (×10): qty 500

## 2016-02-24 MED ORDER — KCL IN DEXTROSE-NACL 20-5-0.45 MEQ/L-%-% IV SOLN
INTRAVENOUS | Status: DC
  Administered 2016-02-24 – 2016-02-27 (×6): via INTRAVENOUS
  Filled 2016-02-24 (×6): qty 1000

## 2016-02-24 MED ORDER — MIDAZOLAM HCL 2 MG/2ML IJ SOLN
INTRAMUSCULAR | Status: AC
  Filled 2016-02-24: qty 2

## 2016-02-24 MED ORDER — SENNOSIDES-DOCUSATE SODIUM 8.6-50 MG PO TABS
1.0000 | ORAL_TABLET | Freq: Every evening | ORAL | Status: DC | PRN
  Administered 2016-02-26: 1 via ORAL
  Filled 2016-02-24: qty 1

## 2016-02-24 MED ORDER — 0.9 % SODIUM CHLORIDE (POUR BTL) OPTIME
TOPICAL | Status: DC | PRN
  Administered 2016-02-24: 1000 mL

## 2016-02-24 MED ORDER — ZOLPIDEM TARTRATE 5 MG PO TABS: 5.0000 mg | ORAL_TABLET | Freq: Every evening | ORAL | Status: DC | PRN

## 2016-02-24 MED ORDER — LIDOCAINE HCL (CARDIAC) 20 MG/ML IV SOLN
INTRAVENOUS | Status: DC | PRN
  Administered 2016-02-24: 60 mg via INTRAVENOUS

## 2016-02-24 MED ORDER — ONDANSETRON HCL 4 MG/2ML IJ SOLN
INTRAMUSCULAR | Status: DC | PRN
  Administered 2016-02-24: 4 mg via INTRAVENOUS

## 2016-02-24 MED ORDER — PROPOFOL 10 MG/ML IV BOLUS
INTRAVENOUS | Status: DC | PRN
Start: 2016-02-24 — End: 2016-02-24
  Administered 2016-02-24: 130 mg via INTRAVENOUS

## 2016-02-24 MED ORDER — LACTATED RINGERS IV SOLN: INTRAVENOUS | Status: DC | PRN

## 2016-02-24 MED ORDER — PHENYLEPHRINE HCL 10 MG/ML IJ SOLN
INTRAMUSCULAR | Status: DC | PRN
  Administered 2016-02-24 (×5): 80 ug via INTRAVENOUS

## 2016-02-24 MED ORDER — MIDAZOLAM HCL 5 MG/5ML IJ SOLN
INTRAMUSCULAR | Status: DC | PRN
  Administered 2016-02-24: 2 mg via INTRAVENOUS

## 2016-02-24 MED ORDER — PROPOFOL 10 MG/ML IV BOLUS
INTRAVENOUS | Status: AC
  Filled 2016-02-24: qty 20

## 2016-02-24 MED ORDER — DOCUSATE SODIUM 100 MG PO CAPS
100.0000 mg | ORAL_CAPSULE | Freq: Two times a day (BID) | ORAL | Status: DC
  Administered 2016-02-24 – 2016-02-28 (×9): 100 mg via ORAL
  Filled 2016-02-24 (×9): qty 1

## 2016-02-24 MED ORDER — PANTOPRAZOLE SODIUM 40 MG PO TBEC
40.0000 mg | DELAYED_RELEASE_TABLET | Freq: Every day | ORAL | Status: DC
  Administered 2016-02-24 – 2016-02-28 (×5): 40 mg via ORAL
  Filled 2016-02-24 (×5): qty 1

## 2016-02-24 MED ORDER — DIPHENHYDRAMINE HCL 50 MG/ML IJ SOLN: 12.5000 mg | Freq: Four times a day (QID) | INTRAMUSCULAR | Status: DC | PRN

## 2016-02-24 SURGICAL SUPPLY — 33 items
BLADE SURG ROTATE 9660 (MISCELLANEOUS) IMPLANT
BNDG GAUZE ELAST 4 BULKY (GAUZE/BANDAGES/DRESSINGS) ×3 IMPLANT
CANISTER SUCTION 2500CC (MISCELLANEOUS) ×3 IMPLANT
CHLORAPREP W/TINT 26ML (MISCELLANEOUS) IMPLANT
COVER SURGICAL LIGHT HANDLE (MISCELLANEOUS) ×3 IMPLANT
DRAPE LAPAROTOMY TRNSV 102X78 (DRAPE) ×3 IMPLANT
DRAPE UTILITY XL STRL (DRAPES) ×6 IMPLANT
ELECT CAUTERY BLADE 6.4 (BLADE) ×3 IMPLANT
ELECT REM PT RETURN 9FT ADLT (ELECTROSURGICAL) ×3
ELECTRODE REM PT RTRN 9FT ADLT (ELECTROSURGICAL) ×1 IMPLANT
GAUZE SPONGE 4X4 12PLY STRL (GAUZE/BANDAGES/DRESSINGS) ×3 IMPLANT
GLOVE BIO SURGEON STRL SZ 6 (GLOVE) ×3 IMPLANT
GLOVE BIOGEL PI IND STRL 6.5 (GLOVE) ×1 IMPLANT
GLOVE BIOGEL PI INDICATOR 6.5 (GLOVE) ×2
GOWN STRL REUS W/ TWL LRG LVL3 (GOWN DISPOSABLE) ×1 IMPLANT
GOWN STRL REUS W/TWL 2XL LVL3 (GOWN DISPOSABLE) ×6 IMPLANT
GOWN STRL REUS W/TWL LRG LVL3 (GOWN DISPOSABLE) ×2
KIT BASIN OR (CUSTOM PROCEDURE TRAY) ×3 IMPLANT
KIT ROOM TURNOVER OR (KITS) ×3 IMPLANT
NS IRRIG 1000ML POUR BTL (IV SOLUTION) ×3 IMPLANT
PACK SURGICAL SETUP 50X90 (CUSTOM PROCEDURE TRAY) ×3 IMPLANT
PAD ARMBOARD 7.5X6 YLW CONV (MISCELLANEOUS) ×6 IMPLANT
PENCIL BUTTON HOLSTER BLD 10FT (ELECTRODE) ×3 IMPLANT
SPONGE LAP 18X18 X RAY DECT (DISPOSABLE) ×3 IMPLANT
SWAB COLLECTION DEVICE MRSA (MISCELLANEOUS) ×3 IMPLANT
SYR BULB IRRIGATION 50ML (SYRINGE) IMPLANT
TOWEL OR 17X24 6PK STRL BLUE (TOWEL DISPOSABLE) ×3 IMPLANT
TOWEL OR 17X26 10 PK STRL BLUE (TOWEL DISPOSABLE) ×3 IMPLANT
TUBE ANAEROBIC SPECIMEN COL (MISCELLANEOUS) ×3 IMPLANT
TUBE CONNECTING 12'X1/4 (SUCTIONS) ×1
TUBE CONNECTING 12X1/4 (SUCTIONS) ×2 IMPLANT
WATER STERILE IRR 1000ML POUR (IV SOLUTION) ×3 IMPLANT
YANKAUER SUCT BULB TIP NO VENT (SUCTIONS) ×3 IMPLANT

## 2016-02-24 NOTE — Anesthesia Postprocedure Evaluation (Signed)
Anesthesia Post Note  Patient: Nicole Browning  Procedure(s) Performed: Procedure(s) (LRB): IRRIGATION AND DEBRIDEMENT ABSCESS (N/A)  Patient location during evaluation: PACU Anesthesia Type: General Level of consciousness: awake and alert Pain management: pain level controlled Vital Signs Assessment: post-procedure vital signs reviewed and stable Respiratory status: spontaneous breathing, nonlabored ventilation, respiratory function stable and patient connected to nasal cannula oxygen Cardiovascular status: blood pressure returned to baseline and stable Postop Assessment: no signs of nausea or vomiting Anesthetic complications: no       Last Vitals:  Vitals:   02/24/16 1830 02/24/16 1838  BP: 110/62 124/69  Pulse: (!) 121   Resp: (!) 21 (!) 22  Temp:  36.8 C    Last Pain:  Vitals:   02/24/16 1838  TempSrc:   PainSc: Asleep                 Naziah Portee,W. EDMOND

## 2016-02-24 NOTE — Progress Notes (Signed)
Pharmacy Antibiotic Note  Nicole Browning is a 45 y.o. female in ED on 02/24/2016 with sepsis- possible source buttock abscess.  Pharmacy has been consulted for Vancomycin and Zosyn dosing.  SCr wnl, estimated CrCl ~80-66m/min.  WBC wnl, LA elevated at 2.19.  Hypotensive, HR 80s.   Plan: Zosyn 3.375g IV x1 over 30 minutes now, Vancomycin  1000 mg IV now, then Vancomycin 500 IV every 8 hours.  Goal trough 15-20 mcg/mL. Zosyn 3.375g IV q8h (4 hour infusion).  Height: 5' 7.5" (171.5 cm) Weight: 130 lb (59 kg) IBW/kg (Calculated) : 62.75  Temp (24hrs), Avg:97.7 F (36.5 C), Min:97.7 F (36.5 C), Max:97.7 F (36.5 C)   Recent Labs Lab 02/21/16 0906 02/21/16 0919 02/24/16 0844 02/24/16 0858  WBC 26.5*  --  8.6  --   CREATININE  --  0.70 0.75  --   LATICACIDVEN  --   --   --  2.19*    Estimated Creatinine Clearance: 82.7 mL/min (by C-G formula based on SCr of 0.75 mg/dL).    Allergies  Allergen Reactions  . Percocet [Oxycodone-Acetaminophen] Nausea Only  . Valium [Diazepam] Nausea Only    Antimicrobials this admission: Zosyn 12/24 >> Vancomycin 12/24 >>  Dose adjustments this admission:   Microbiology results: 12/24 Blood:   Thank you for allowing pharmacy to be a part of this patient's care.  JSloan Leiter PharmD, BCPS Clinical Pharmacist #[phone] 3:30 PM #[phone] hours 02/24/2016 9:55 AM

## 2016-02-24 NOTE — ED Notes (Signed)
Placed patient into a gown waiting on provider

## 2016-02-24 NOTE — H&P (Addendum)
Nicole Browning is an 45 y.o. female.   Chief Complaint: perirectal abscess.   HPI:  Pt is a 45 yo F with a perirectal abscess that she has been dealing with for 5-6 days.  She has been seen at urgent care and in the ED.  She has been on antibiotics.  The abscess was drained and packed 4 days ago in the ED.  Packing has fallen out.  Since that time, she has had copious drainage and bleeding and continues to have severe pain.  She denies fever/chills.  She has been having chills.   History reviewed. No pertinent past medical history.  Chronic anemia. Moderate protein calorie malnutrition.  History reviewed. No pertinent surgical history.  History reviewed. No pertinent family history. Social History:  reports that she has been smoking Cigarettes.  She has been smoking about 0.25 packs per day. She has never used smokeless tobacco. She reports that she drinks alcohol. She reports that she does not use drugs.  Allergies:  Allergies  Allergen Reactions  . Percocet [Oxycodone-Acetaminophen] Nausea Only  . Valium [Diazepam] Nausea Only   Medications  Benzocaine (BOIL-EASE EX)    esomeprazole (NEXIUM) 40 MG capsule    HYDROcodone-acetaminophen (NORCO/VICODIN) 5-325 MG tablet    ondansetron (ZOFRAN ODT) 4 MG disintegrating tablet    sulfamethoxazole-trimethoprim (BACTRIM DS,SEPTRA DS) 800-160 MG tablet    dicyclomine (BENTYL) 20 MG tablet    diphenhydrAMINE (BENADRYL) 25 MG tablet       Results for orders placed or performed during the hospital encounter of 02/24/16 (from the past 48 hour(s))  Comprehensive metabolic panel     Status: Abnormal   Collection Time: 02/24/16  8:44 AM  Result Value Ref Range   Sodium 137 135 - 145 mmol/L   Potassium 2.9 (L) 3.5 - 5.1 mmol/L   Chloride 101 101 - 111 mmol/L   CO2 27 22 - 32 mmol/L   Glucose, Bld 123 (H) 65 - 99 mg/dL   BUN 5 (L) 6 - 20 mg/dL   Creatinine, Ser 0.75 0.44 - 1.00 mg/dL   Calcium 8.4 (L) 8.9 - 10.3 mg/dL   Total Protein 6.1  (L) 6.5 - 8.1 g/dL   Albumin 1.8 (L) 3.5 - 5.0 g/dL   AST 17 15 - 41 U/L   ALT 12 (L) 14 - 54 U/L   Alkaline Phosphatase 66 38 - 126 U/L   Total Bilirubin 0.6 0.3 - 1.2 mg/dL   GFR calc non Af Amer >60 >60 mL/min   GFR calc Af Amer >60 >60 mL/min    Comment: (NOTE) The eGFR has been calculated using the CKD EPI equation. This calculation has not been validated in all clinical situations. eGFR's persistently <60 mL/min signify possible Chronic Kidney Disease.    Anion gap 9 5 - 15  CBC with Differential     Status: Abnormal   Collection Time: 02/24/16  8:44 AM  Result Value Ref Range   WBC 8.6 4.0 - 10.5 K/uL   RBC 2.95 (L) 3.87 - 5.11 MIL/uL   Hemoglobin 8.1 (L) 12.0 - 15.0 g/dL   HCT 23.4 (L) 36.0 - 46.0 %   MCV 79.3 78.0 - 100.0 fL   MCH 27.5 26.0 - 34.0 pg   MCHC 34.6 30.0 - 36.0 g/dL   RDW 13.6 11.5 - 15.5 %   Platelets 368 150 - 400 K/uL   Neutrophils Relative % 69 %   Lymphocytes Relative 21 %   Monocytes Relative 9 %   Eosinophils  Relative 1 %   Basophils Relative 0 %   Neutro Abs 5.9 1.7 - 7.7 K/uL   Lymphs Abs 1.8 0.7 - 4.0 K/uL   Monocytes Absolute 0.8 0.1 - 1.0 K/uL   Eosinophils Absolute 0.1 0.0 - 0.7 K/uL   Basophils Absolute 0.0 0.0 - 0.1 K/uL   RBC Morphology POLYCHROMASIA PRESENT   Protime-INR     Status: None   Collection Time: 02/24/16  8:44 AM  Result Value Ref Range   Prothrombin Time 15.2 11.4 - 15.2 seconds   INR 1.19   I-Stat CG4 Lactic Acid, ED     Status: Abnormal   Collection Time: 02/24/16  8:58 AM  Result Value Ref Range   Lactic Acid, Venous 2.19 (HH) 0.5 - 1.9 mmol/L  ABO/Rh     Status: None   Collection Time: 02/24/16 10:16 AM  Result Value Ref Range   ABO/RH(D) O POS   Type and screen     Status: None   Collection Time: 02/24/16 10:17 AM  Result Value Ref Range   ABO/RH(D) O POS    Antibody Screen NEG    Sample Expiration 02/27/2016   I-Stat CG4 Lactic Acid, ED     Status: None   Collection Time: 02/24/16 12:39 PM  Result  Value Ref Range   Lactic Acid, Venous 0.98 0.5 - 1.9 mmol/L   Ct Pelvis W Contrast  Result Date: 02/24/2016 CLINICAL DATA:  Left buttock abscess, status post I&D EXAM: CT PELVIS WITH CONTRAST TECHNIQUE: Multidetector CT imaging of the pelvis was performed using the standard protocol following the bolus administration of intravenous contrast. CONTRAST:  119m ISOVUE-300 IOPAMIDOL (ISOVUE-300) INJECTION 61% COMPARISON:  None. FINDINGS: Urinary Tract:  Bladder is within normal limits. Bowel:  Visualized bowel is unremarkable. Vascular/Lymphatic: Iliac vessels are within normal limits. No suspicious pelvic lymphadenopathy. Reproductive:  Uterus is within normal limits. Bilateral ovaries are within normal limits. Other:  No pelvic ascites. Subcutaneous gas in the left ischiorectal fossa (series 2/ image 43), left medial gluteal region (series 2/ image 51), and along the medial aspect of the gluteus major muscle (series 2/ image 45). Additional soft tissue gas extends into the left obturator fossa (series 2/ image 35). This is likely iatrogenic, related to recent procedure. No residual fluid collection/abscess is evident within the soft tissues on the left. 4.1 x 2.0 x 2.2 cm subcutaneous fluid collection along the right anterior gluteal fold (series 2/ image 52; series 4/ image 94). Musculoskeletal: Visualized osseous structures are within normal limits. IMPRESSION: Soft tissue gas extending from the left medial gluteal region into the left ischiorectal fossa and left obturator region, likely related to recent procedure. No residual fluid collection/abscess on the left. 4.1 x 2.0 x 2.2 cm subcutaneous fluid collection along the right anterior gluteal fold, abscess not excluded. Electronically Signed   By: SJulian HyM.D.   On: 02/24/2016 11:55    Review of Systems  Constitutional: Positive for chills.  HENT: Negative.   Eyes: Negative.   Respiratory: Negative.   Cardiovascular: Negative.     Gastrointestinal: Negative for blood in stool, nausea and vomiting.       Severe rectal pain  Genitourinary: Negative.   Musculoskeletal: Negative.   Skin: Negative.   Neurological: Negative.   Endo/Heme/Allergies: Negative.   Psychiatric/Behavioral: Negative.     Blood pressure 99/63, pulse 99, temperature 99.1 F (37.3 C), temperature source Oral, resp. rate 17, height 5' 7.5" (1.715 m), weight 59 kg (130 lb), last menstrual period 02/02/2016,  SpO2 96 %. Physical Exam  Constitutional: She is oriented to person, place, and time. She appears well-developed and well-nourished. She appears distressed.  HENT:  Head: Normocephalic and atraumatic.  Right Ear: External ear normal.  Left Ear: External ear normal.  Eyes: Conjunctivae are normal. Pupils are equal, round, and reactive to light.  Neck: Neck supple.  Cardiovascular: Normal rate.   Respiratory: Effort normal. No respiratory distress.  GI: Soft.  Genitourinary:     Genitourinary Comments: Swelling and thinned out skin on left.  Copious purulent drainage from wound.    Neurological: She is alert and oriented to person, place, and time.  Skin: Skin is warm and dry. No rash noted. She is not diaphoretic. There is erythema. No pallor.  Psychiatric: She has a normal mood and affect. Her behavior is normal. Judgment and thought content normal.     Assessment/Plan Perirectal abscess.  CT does show abscess going high up in the ischiorectal space.   Pt would benefit from operative I&D.  She can barely stand to have the buttock touched. Will wash out wound with pulse lavage.    Discussed surgery with patient.    Stark Klein, MD 02/24/2016, 3:32 PM

## 2016-02-24 NOTE — ED Provider Notes (Addendum)
[facility] DEPT Provider Note   CSN: [id number] Arrival date & time: 02/24/16  0820     History   Chief Complaint Chief Complaint  Patient presents with  . Wound Check    HPI Nicole Browning is a 45 y.o. female otherwise healthy here presenting with left buttock pain. Patient went to urgent care 4 days ago was thought to have cellulitis versus abscess of the left buttock. Patient was prescribed hydrocodone, Bactrim. Patient was seen in the ER the following day, and I&D was performed and was sent home. Patient states that since she was discharged, she has persistent bleeding and drainage from the left buttock. She has been having severe pain there and has subjective chills. She states that the packing did come out already.  The history is provided by the patient.    History reviewed. No pertinent past medical history.  There are no active problems to display for this patient.   History reviewed. No pertinent surgical history.  OB History    No data available       Home Medications    Prior to Admission medications   Medication Sig Start Date End Date Taking? Authorizing Provider  Benzocaine (BOIL-EASE EX) Apply 1 application topically 2 (two) times daily.   Yes Historical Provider, MD  esomeprazole (NEXIUM) 40 MG capsule Take 40 mg by mouth daily as needed (reflux).    Yes Historical Provider, MD  HYDROcodone-acetaminophen (NORCO/VICODIN) 5-325 MG tablet Take 2 tablets by mouth every 4 (four) hours as needed. 02/20/16  Yes Hollace Kinnier Sofia, PA-C  ondansetron (ZOFRAN ODT) 4 MG disintegrating tablet Take 1 tablet (4 mg total) by mouth every 8 (eight) hours as needed for nausea or vomiting. 02/21/16  Yes Blanchie Dessert, MD  sulfamethoxazole-trimethoprim (BACTRIM DS,SEPTRA DS) 800-160 MG tablet Take 1 tablet by mouth 2 (two) times daily. 02/20/16 02/27/16 Yes Hollace Kinnier Sofia, PA-C  dicyclomine (BENTYL) 20 MG tablet Take 1 tablet (20 mg total) by mouth 3 (three) times daily as  needed (abdominal cramps). Patient not taking: Reported on 02/24/2016 10/23/15   [facility], PA-C  diphenhydrAMINE (BENADRYL) 25 MG tablet Take 1 tablet (25 mg total) by mouth every 6 (six) hours as needed for itching. Patient not taking: Reported on 10/23/2015 12/20/14   Julianne Rice, MD    Family History History reviewed. No pertinent family history.  Social History Social History  Substance Use Topics  . Smoking status: Current Every Day Smoker    Packs/day: 0.25    Types: Cigarettes  . Smokeless tobacco: Never Used  . Alcohol use Yes     Comment: occasional     Allergies   Percocet [oxycodone-acetaminophen] and Valium [diazepam]   Review of Systems Review of Systems  Skin:       L buttock abscess   All other systems reviewed and are negative.    Physical Exam Updated Vital Signs BP (!) 91/53 (BP Location: Right Arm)   Pulse 96   Temp 99.1 F (37.3 C) (Oral)   Resp 18   Ht 5' 7.5" (1.715 m)   Wt 130 lb (59 kg)   LMP 02/02/2016 (Approximate)   SpO2 99%   BMI 20.06 kg/m   Physical Exam  Constitutional:  Uncomfortable, ill appearing   HENT:  Head: Normocephalic.  MM dry   Eyes: EOM are normal. Pupils are equal, round, and reactive to light.  Neck: Normal range of motion. Neck supple.  Cardiovascular: Normal rate, regular rhythm and normal heart sounds.  Pulmonary/Chest: Effort normal and breath sounds normal. No respiratory distress. She has no wheezes.  Abdominal: Soft. Bowel sounds are normal. There is no tenderness.  Genitourinary:  Genitourinary Comments: L buttock with cellulitis of entire buttock. I and D site swollen but no drainage and the site. No packing visible   Musculoskeletal: Normal range of motion.  Neurological: She is alert.  Skin: Skin is warm. There is erythema.  Psychiatric: She has a normal mood and affect.  Nursing note and vitals reviewed.    ED Treatments / Results  Labs (all labs ordered are listed, but only  abnormal results are displayed) Labs Reviewed  COMPREHENSIVE METABOLIC PANEL - Abnormal; Notable for the following:       Result Value   Potassium 2.9 (*)    Glucose, Bld 123 (*)    BUN 5 (*)    Calcium 8.4 (*)    Total Protein 6.1 (*)    Albumin 1.8 (*)    ALT 12 (*)    All other components within normal limits  CBC WITH DIFFERENTIAL/PLATELET - Abnormal; Notable for the following:    RBC 2.95 (*)    Hemoglobin 8.1 (*)    HCT 23.4 (*)    All other components within normal limits  I-STAT CG4 LACTIC ACID, ED - Abnormal; Notable for the following:    Lactic Acid, Venous 2.19 (*)    All other components within normal limits  CULTURE, BLOOD (ROUTINE X 2)  CULTURE, BLOOD (ROUTINE X 2)  PROTIME-INR  I-STAT BETA HCG BLOOD, ED ([facility], [facility], [facility] ONLY)  I-STAT CG4 LACTIC ACID, ED  TYPE AND SCREEN  ABO/RH    EKG  EKG Interpretation None       Radiology Ct Pelvis W Contrast  Result Date: 02/24/2016 CLINICAL DATA:  Left buttock abscess, status post I&D EXAM: CT PELVIS WITH CONTRAST TECHNIQUE: Multidetector CT imaging of the pelvis was performed using the standard protocol following the bolus administration of intravenous contrast. CONTRAST:  153m ISOVUE-300 IOPAMIDOL (ISOVUE-300) INJECTION 61% COMPARISON:  None. FINDINGS: Urinary Tract:  Bladder is within normal limits. Bowel:  Visualized bowel is unremarkable. Vascular/Lymphatic: Iliac vessels are within normal limits. No suspicious pelvic lymphadenopathy. Reproductive:  Uterus is within normal limits. Bilateral ovaries are within normal limits. Other:  No pelvic ascites. Subcutaneous gas in the left ischiorectal fossa (series 2/ image 43), left medial gluteal region (series 2/ image 51), and along the medial aspect of the gluteus major muscle (series 2/ image 45). Additional soft tissue gas extends into the left obturator fossa (series 2/ image 35). This is likely iatrogenic, related to recent procedure. No residual fluid collection/abscess is  evident within the soft tissues on the left. 4.1 x 2.0 x 2.2 cm subcutaneous fluid collection along the right anterior gluteal fold (series 2/ image 52; series 4/ image 94). Musculoskeletal: Visualized osseous structures are within normal limits. IMPRESSION: Soft tissue gas extending from the left medial gluteal region into the left ischiorectal fossa and left obturator region, likely related to recent procedure. No residual fluid collection/abscess on the left. 4.1 x 2.0 x 2.2 cm subcutaneous fluid collection along the right anterior gluteal fold, abscess not excluded. Electronically Signed   By: SJulian HyM.D.   On: 02/24/2016 11:55    Procedures Procedures (including critical care time)  CRITICAL CARE Performed by: DWandra Arthurs  Total critical care time: 30 minutes  Critical care time was exclusive of separately billable procedures and treating other patients.  Critical care was  necessary to treat or prevent imminent or life-threatening deterioration.  Critical care was time spent personally by me on the following activities: development of treatment plan with patient and/or surrogate as well as nursing, discussions with consultants, evaluation of patient's response to treatment, examination of patient, obtaining history from patient or surrogate, ordering and performing treatments and interventions, ordering and review of laboratory studies, ordering and review of radiographic studies, pulse oximetry and re-evaluation of patient's condition.   Medications Ordered in ED Medications  piperacillin-tazobactam (ZOSYN) IVPB 3.375 g (0 g Intravenous Stopped 02/24/16 1113)    Followed by  piperacillin-tazobactam (ZOSYN) IVPB 3.375 g (not administered)  vancomycin (VANCOCIN) IVPB 1000 mg/200 mL premix (0 mg Intravenous Stopped 02/24/16 1236)    Followed by  vancomycin (VANCOCIN) 500 mg in sodium chloride 0.9 % 100 mL IVPB (not administered)  sodium chloride 0.9 % bolus 1,000 mL (0 mLs  Intravenous Stopped 02/24/16 1127)    And  sodium chloride 0.9 % bolus 1,000 mL (0 mLs Intravenous Stopped 02/24/16 1127)  morphine 4 MG/ML injection 4 mg (4 mg Intravenous Given 02/24/16 1032)  ondansetron (ZOFRAN) injection 4 mg (4 mg Intravenous Given 02/24/16 1032)  sodium chloride 0.9 % 1,000 mL with potassium chloride 80 mEq infusion ( Intravenous Given 02/24/16 1154)  iopamidol (ISOVUE-300) 61 % injection (100 mLs  Contrast Given 02/24/16 1126)  sodium chloride 0.9 % bolus 1,000 mL (1,000 mLs Intravenous New Bag/Given 02/24/16 1249)  fentaNYL (SUBLIMAZE) injection 50 mcg (50 mcg Intravenous Given 02/24/16 1249)     Initial Impression / Assessment and Plan / ED Course  I have reviewed the triage vital signs and the nursing notes.  Pertinent labs & imaging results that were available during my care of the patient were reviewed by me and considered in my medical decision making (see chart for details).  Clinical Course     Ariatna Jester is a 45 y.o. female here with worsening L buttock abscess/ cellulitis. Patient now hypotensive. Concerned for sepsis from L buttock abscess/ cellulitis. Will activate code sepsis, give 30 cc/kg bolus, vanc/zosyn. Will get CT pelvis for further evaluation   12:55 PM CT showed subcutaneous air with possible recurrent abscess. I am concerned for possible necrotyzing fasciitis. WBC nl but lactate 2.2 initially. Still hypotensive despite 30 cc /kg bolus and vanc/zosyn. Patient meets criteria for severe sepsis. Consulted Dr. Barry Dienes from surgery to see patient.   Sepsis - Repeat Assessment  Performed at:    12:56 pm  Vitals     Blood pressure (!) 91/53, pulse 96, temperature 99.1 F (37.3 C), temperature source Oral, resp. rate 18, height 5' 7.5" (1.715 m), weight 130 lb (59 kg), last menstrual period 02/02/2016, SpO2 99 %.  Heart:     Regular rate and rhythm  Lungs:    CTA  Capillary Refill:   <2 sec  Peripheral Pulse:   Radial pulse  palpable  Skin:     Normal Color   3 pm Dr. Barry Dienes at bedside. She will admit for operative I and D. Given vanc/zosyn, 30 cc/kg bolus. BP still in the upper 90s. Surgery to admit for severe sepsis secondary to large perirectal abscess.    Final Clinical Impressions(s) / ED Diagnoses   Final diagnoses:  None    New Prescriptions New Prescriptions   No medications on file     Drenda Freeze, MD 02/24/16 [place] Jamesyn [place], MD 02/25/16 1023

## 2016-02-24 NOTE — Anesthesia Preprocedure Evaluation (Addendum)
Anesthesia Evaluation  Patient identified by MRN, date of birth, ID band Patient awake    Reviewed: Allergy & Precautions, H&P , NPO status , Patient's Chart, lab work & pertinent test results  Airway Mallampati: II  TM Distance: >3 FB Neck ROM: Full    Dental  (+) Teeth Intact, Dental Advisory Given   Pulmonary Current Smoker,    breath sounds clear to auscultation       Cardiovascular  Rhythm:Regular Rate:Normal     Neuro/Psych    GI/Hepatic   Endo/Other    Renal/GU      Musculoskeletal   Abdominal   Peds  Hematology   Anesthesia Other Findings   Reproductive/Obstetrics                           Anesthesia Physical Anesthesia Plan  ASA: II and emergent  Anesthesia Plan: General   Post-op Pain Management:    Induction: Intravenous  Airway Management Planned: Oral ETT  Additional Equipment:   Intra-op Plan:   Post-operative Plan: Extubation in OR  Informed Consent: I have reviewed the patients History and Physical, chart, labs and discussed the procedure including the risks, benefits and alternatives for the proposed anesthesia with the patient or authorized representative who has indicated his/her understanding and acceptance.   Dental advisory given  Plan Discussed with: CRNA and Anesthesiologist  Anesthesia Plan Comments:        Anesthesia Quick Evaluation

## 2016-02-24 NOTE — ED Notes (Signed)
Pt to CT

## 2016-02-24 NOTE — ED Triage Notes (Signed)
Pt here for abscess to buttocks; pt sts increased drainage with foul smelling odor

## 2016-02-24 NOTE — Op Note (Signed)
Incision and Drainage complex left ischiorectal abscess Procedure Note   Pre-operative Diagnosis: Left perirectal abscess   Post-operative Diagnosis: same   Indications: continued swelling, purulent drainage, and hypotension in conjunction with abscess  Anesthesia: General   Procedure Details  The procedure, risks and complications have been discussed in detail (including, infection, bleeding, need for additional procedures) with the patient, and the patient has signed consent to the procedure. The patient was informed that the wound would be left open.  The patient was taken to OR 2 and general anesthesia was induced. The patient was placed into lithotomy position.  The skin was sterilely prepped and draped over the affected area in the usual fashion. Time out was performed according to the surgical safety checklist.   I&D with a #15 blade was performed on the left perirectal region.  Cultures were sent. Additional skin was debrided around the opening. The septations were broken up digitally.  Purulent drainage was present. An extremely large cavity was in the left buttock and another large cavity extended anteriorly.  The pulse lavage was used to irrigate the cavity.   A penrose drain was placed anteriorly and a portion of it was placed laterally as well.  Both ends were secured to the perirectal skin with one 2-0 nylon.   The wound was packed with kerlix.  The patient was awakened from anesthesia and taken to the PACU in stable condition.   Findings:  Tons of pus.    EBL: min cc's   Drains: None   Condition: Tolerated procedure well   Complications:  none known.

## 2016-02-24 NOTE — Anesthesia Procedure Notes (Addendum)
Procedure Name: Intubation Date/Time: 02/24/2016 5:17 PM Performed by: Ollen Bowl Pre-anesthesia Checklist: Patient identified, Emergency Drugs available, Suction available, Patient being monitored and Timeout performed Patient Re-evaluated:Patient Re-evaluated prior to inductionOxygen Delivery Method: Circle system utilized and Simple face mask Preoxygenation: Pre-oxygenation with 100% oxygen Intubation Type: IV induction Ventilation: Mask ventilation without difficulty Laryngoscope Size: Mac and 3 Grade View: Grade II Tube type: Oral Tube size: 7.5 mm Number of attempts: 1 Airway Equipment and Method: Patient positioned with wedge pillow and Stylet Placement Confirmation: ETT inserted through vocal cords under direct vision,  positive ETCO2 and breath sounds checked- equal and bilateral Secured at: 22 cm Tube secured with: Tape Dental Injury: Dental damage  Comments: Easy atraumatic intubation. Pt noted to have discolored L upper tooth dangling unattached. Tooth site not bleeding and tooth placed in bag per MDA to be given to pt/family. Upon arrival to PACU, PACU RN noted yet another discolored tooth in the bed. This tooth placed in the bag with the other. It is unknown when this tooth came out.

## 2016-02-24 NOTE — ED Notes (Signed)
iStat hcg result <5.0

## 2016-02-24 NOTE — Transfer of Care (Signed)
Immediate Anesthesia Transfer of Care Note  Patient: Nicole Browning  Procedure(s) Performed: Procedure(s): IRRIGATION AND DEBRIDEMENT ABSCESS (N/A)  Patient Location: PACU  Anesthesia Type:General  Level of Consciousness: awake, alert  and oriented  Airway & Oxygen Therapy: Patient Spontanous Breathing and Patient connected to nasal cannula oxygen  Post-op Assessment: Report given to RN and Post -op Vital signs reviewed and stable  Post vital signs: Reviewed and stable  Last Vitals:  Vitals:   02/24/16 1811 02/24/16 1815  BP:  111/77  Pulse: (!) 130 (!) 126  Resp: (!) 22 (!) 32  Temp: 36.4 C     Last Pain:  Vitals:   02/24/16 1516  TempSrc:   PainSc: 8          Complications: No apparent anesthesia complications

## 2016-02-24 NOTE — ED Notes (Signed)
Hooked patient back up to the monitor after xray

## 2016-02-25 ENCOUNTER — Encounter (HOSPITAL_COMMUNITY): Payer: Self-pay | Admitting: *Deleted

## 2016-02-25 LAB — BASIC METABOLIC PANEL
Anion gap: 5 (ref 5–15)
BUN: 6 mg/dL (ref 6–20)
CHLORIDE: 108 mmol/L (ref 101–111)
CO2: 23 mmol/L (ref 22–32)
CREATININE: 0.7 mg/dL (ref 0.44–1.00)
Calcium: 7.3 mg/dL — ABNORMAL LOW (ref 8.9–10.3)
GFR calc non Af Amer: >60 mL/min (ref >60)
Glucose, Bld: 218 mg/dL — ABNORMAL HIGH (ref 65–99)
Potassium: 4.5 mmol/L (ref 3.5–5.1)
Sodium: 136 mmol/L (ref 135–145)

## 2016-02-25 LAB — CBC
HEMATOCRIT: 21.6 % — AB (ref 36.0–46.0)
HEMOGLOBIN: 7.3 g/dL — AB (ref 12.0–15.0)
MCH: 26.7 pg (ref 26.0–34.0)
MCHC: 33.8 g/dL (ref 30.0–36.0)
MCV: 79.1 fL (ref 78.0–100.0)
Platelets: 312 10*3/uL (ref 150–400)
RBC: 2.73 MIL/uL — ABNORMAL LOW (ref 3.87–5.11)
RDW: 13.5 % (ref 11.5–15.5)
WBC: 11.2 10*3/uL — ABNORMAL HIGH (ref 4.0–10.5)

## 2016-02-25 NOTE — Progress Notes (Signed)
1 Day Post-Op   Subjective: Very sore but feels better than before surgery.  Objective: Vital signs in last 24 hours: Temp:  [97.6 F (36.4 C)-99.1 F (37.3 C)] 97.9 F (36.6 C) (12/25 0500) Pulse Rate:  [93-130] 102 (12/25 0500) Resp:  [10-22] 19 (12/25 0500) BP: (89-126)/(53-86) 126/70 (12/25 0500) SpO2:  [92 %-100 %] 94 % (12/25 0500)    Intake/Output from previous day: 12/24 0701 - 12/25 0700 In: 6550 [P.O.:600; I.V.:5800; IV Piggyback:150] Out: 100 [Blood:100] Intake/Output this shift: Total I/O In: 240 [P.O.:240] Out: -   General appearance: alert, cooperative and no distress Incision/Wound: Packing in place. Somewhat tender and mild induration. Thin bloody drainage.  Lab Results:   Recent Labs  02/24/16 0844 02/25/16 0356  WBC 8.6 11.2*  HGB 8.1* 7.3*  HCT 23.4* 21.6*  PLT 368 312   BMET  Recent Labs  02/24/16 0844 02/25/16 0356  NA 137 136  K 2.9* 4.5  CL 101 108  CO2 27 23  GLUCOSE 123* 218*  BUN 5* 6  CREATININE 0.75 0.70  CALCIUM 8.4* 7.3*     Studies/Results: Ct Pelvis W Contrast  Result Date: 02/24/2016 CLINICAL DATA:  Left buttock abscess, status post I&D EXAM: CT PELVIS WITH CONTRAST TECHNIQUE: Multidetector CT imaging of the pelvis was performed using the standard protocol following the bolus administration of intravenous contrast. CONTRAST:  119m ISOVUE-300 IOPAMIDOL (ISOVUE-300) INJECTION 61% COMPARISON:  None. FINDINGS: Urinary Tract:  Bladder is within normal limits. Bowel:  Visualized bowel is unremarkable. Vascular/Lymphatic: Iliac vessels are within normal limits. No suspicious pelvic lymphadenopathy. Reproductive:  Uterus is within normal limits. Bilateral ovaries are within normal limits. Other:  No pelvic ascites. Subcutaneous gas in the left ischiorectal fossa (series 2/ image 43), left medial gluteal region (series 2/ image 51), and along the medial aspect of the gluteus major muscle (series 2/ image 45). Additional soft  tissue gas extends into the left obturator fossa (series 2/ image 35). This is likely iatrogenic, related to recent procedure. No residual fluid collection/abscess is evident within the soft tissues on the left. 4.1 x 2.0 x 2.2 cm subcutaneous fluid collection along the right anterior gluteal fold (series 2/ image 52; series 4/ image 94). Musculoskeletal: Visualized osseous structures are within normal limits. IMPRESSION: Soft tissue gas extending from the left medial gluteal region into the left ischiorectal fossa and left obturator region, likely related to recent procedure. No residual fluid collection/abscess on the left. 4.1 x 2.0 x 2.2 cm subcutaneous fluid collection along the right anterior gluteal fold, abscess not excluded. Electronically Signed   By: SJulian HyM.D.   On: 02/24/2016 11:55    Anti-infectives: Anti-infectives    Start     Dose/Rate Route Frequency Ordered Stop   02/24/16 1800  piperacillin-tazobactam (ZOSYN) IVPB 3.375 g     3.375 g 12.5 mL/hr over 240 Minutes Intravenous Every 8 hours 02/24/16 0959     02/24/16 1800  vancomycin (VANCOCIN) 500 mg in sodium chloride 0.9 % 100 mL IVPB     500 mg 100 mL/hr over 60 Minutes Intravenous Every 8 hours 02/24/16 0959     02/24/16 1000  piperacillin-tazobactam (ZOSYN) IVPB 3.375 g  Status:  Discontinued     3.375 g 100 mL/hr over 30 Minutes Intravenous  Once 02/24/16 0949 02/24/16 0959   02/24/16 1000  vancomycin (VANCOCIN) IVPB 1000 mg/200 mL premix  Status:  Discontinued     1,000 mg 200 mL/hr over 60 Minutes Intravenous  Once 02/24/16 0949  02/24/16 0959   02/24/16 1000  piperacillin-tazobactam (ZOSYN) IVPB 3.375 g     3.375 g 100 mL/hr over 30 Minutes Intravenous  Once 02/24/16 0959 02/24/16 1113   02/24/16 1000  vancomycin (VANCOCIN) IVPB 1000 mg/200 mL premix     1,000 mg 200 mL/hr over 60 Minutes Intravenous  Once 02/24/16 9798 02/24/16 1236      Assessment/Plan: Large ischial rectal abscess  s/p  Procedure(s): IRRIGATION AND DEBRIDEMENT ABSCESS  Stable postoperatively. Some blood loss anemia superimposed on chronic anemia. Check CBC tomorrow. No active bleeding. Will need packing changed tomorrow.    LOS: 0 days    Nicole Browning 02/25/2016

## 2016-02-26 ENCOUNTER — Encounter (HOSPITAL_COMMUNITY): Payer: Self-pay | Admitting: General Practice

## 2016-02-26 LAB — CBC
HEMATOCRIT: 20.7 % — AB (ref 36.0–46.0)
HEMATOCRIT: 21.4 % — AB (ref 36.0–46.0)
HEMOGLOBIN: 6.9 g/dL — AB (ref 12.0–15.0)
HEMOGLOBIN: 7.1 g/dL — AB (ref 12.0–15.0)
MCH: 26.7 pg (ref 26.0–34.0)
MCH: 26.7 pg (ref 26.0–34.0)
MCHC: 33.3 g/dL (ref 30.0–36.0)
MCHC: 33.2 g/dL (ref 30.0–36.0)
MCV: 80.2 fL (ref 78.0–100.0)
MCV: 80.5 fL (ref 78.0–100.0)
Platelets: 291 10*3/uL (ref 150–400)
Platelets: 315 10*3/uL (ref 150–400)
RBC: 2.58 MIL/uL — AB (ref 3.87–5.11)
RBC: 2.66 MIL/uL — ABNORMAL LOW (ref 3.87–5.11)
RDW: 13.8 % (ref 11.5–15.5)
RDW: 13.8 % (ref 11.5–15.5)
WBC: 8.7 10*3/uL (ref 4.0–10.5)
WBC: 8.4 10*3/uL (ref 4.0–10.5)

## 2016-02-26 LAB — BASIC METABOLIC PANEL
ANION GAP: 8 (ref 5–15)
BUN: 7 mg/dL (ref 6–20)
CHLORIDE: 108 mmol/L (ref 101–111)
CO2: 22 mmol/L (ref 22–32)
Calcium: 7.4 mg/dL — ABNORMAL LOW (ref 8.9–10.3)
Creatinine, Ser: 0.86 mg/dL (ref 0.44–1.00)
GFR calc Af Amer: >60 mL/min (ref >60)
GFR calc non Af Amer: >60 mL/min (ref >60)
GLUCOSE: 132 mg/dL — AB (ref 65–99)
POTASSIUM: 4.6 mmol/L (ref 3.5–5.1)
Sodium: 138 mmol/L (ref 135–145)

## 2016-02-26 LAB — RETICULOCYTES
RBC.: 2.66 MIL/uL — ABNORMAL LOW (ref 3.87–5.11)
Retic Count, Absolute: 13.3 10*3/uL — ABNORMAL LOW (ref 19.0–186.0)
Retic Ct Pct: 0.5 % (ref 0.4–3.1)

## 2016-02-26 LAB — FERRITIN: FERRITIN: 180 ng/mL (ref 11–307)

## 2016-02-26 LAB — HEMOGLOBIN A1C
Hgb A1c MFr Bld: 5.7 % — ABNORMAL HIGH (ref 4.8–5.6)
Mean Plasma Glucose: 117 mg/dL

## 2016-02-26 LAB — VITAMIN B12: Vitamin B-12: 792 pg/mL (ref 180–914)

## 2016-02-26 LAB — FOLATE: FOLATE: 9.4 ng/mL (ref >5.9)

## 2016-02-26 LAB — IRON AND TIBC
IRON: 64 ug/dL (ref 28–170)
Saturation Ratios: 39 % — ABNORMAL HIGH (ref 10.4–31.8)
TIBC: 162 ug/dL — AB (ref 250–450)
UIBC: 98 ug/dL

## 2016-02-26 NOTE — Progress Notes (Signed)
2 Days Post-Op  Subjective: We took her dressing down redressed it partially with wet 4 x 4 into the site.  She still has necrotic tissue that was visible and removed from the site, penrose in place.  It was very painful for her. Objective: Vital signs in last 24 hours: Temp:  [98 F (36.7 C)-98.2 F (36.8 C)] 98.2 F (36.8 C) (12/26 0611) Pulse Rate:  [68-79] 79 (12/26 0611) Resp:  [18-19] 18 (12/26 0611) BP: (103-121)/(56-63) 121/62 (12/26 0611) SpO2:  [97 %-100 %] 99 % (12/26 0611)  360 PO 2300 IV Voided x 9 Afebrile, VSS WBC is back to normal  H/H is down to 6.9/20.7  Down from 8.1/23.4 on admit 02/24/16   Intake/Output from previous day: 12/25 0701 - 12/26 0700 In: 3070 [P.O.:360; I.V.:2260; IV Piggyback:450] Out: -  Intake/Output this shift: No intake/output data recorded.  General appearance: alert, cooperative and no distress Skin: Dressing was removed, packing removed. No cellulitis, significant tenderness and pain with dressing change, malodorous and necrotic tissue around the Penrose. Dr. Excell Seltzer clean the site and removed some external necrotic tissue by hand. Repack with single open with 4 x 4.  Lab Results:   Recent Labs  02/25/16 0356 02/26/16 0545  WBC 11.2* 8.7  HGB 7.3* 6.9*  HCT 21.6* 20.7*  PLT 312 291    BMET  Recent Labs  02/25/16 0356 02/26/16 0545  NA 136 138  K 4.5 4.6  CL 108 108  CO2 23 22  GLUCOSE 218* 132*  BUN 6 7  CREATININE 0.70 0.86  CALCIUM 7.3* 7.4*   PT/INR  Recent Labs  02/24/16 0844  LABPROT 15.2  INR 1.19     Recent Labs Lab 02/24/16 0844  AST 17  ALT 12*  ALKPHOS 66  BILITOT 0.6  PROT 6.1*  ALBUMIN 1.8*     Lipase     Component Value Date/Time   LIPASE 14 10/23/2015 1627     Studies/Results: Ct Pelvis W Contrast  Result Date: 02/24/2016 CLINICAL DATA:  Left buttock abscess, status post I&D EXAM: CT PELVIS WITH CONTRAST TECHNIQUE: Multidetector CT imaging of the pelvis was performed using  the standard protocol following the bolus administration of intravenous contrast. CONTRAST:  110m ISOVUE-300 IOPAMIDOL (ISOVUE-300) INJECTION 61% COMPARISON:  None. FINDINGS: Urinary Tract:  Bladder is within normal limits. Bowel:  Visualized bowel is unremarkable. Vascular/Lymphatic: Iliac vessels are within normal limits. No suspicious pelvic lymphadenopathy. Reproductive:  Uterus is within normal limits. Bilateral ovaries are within normal limits. Other:  No pelvic ascites. Subcutaneous gas in the left ischiorectal fossa (series 2/ image 43), left medial gluteal region (series 2/ image 51), and along the medial aspect of the gluteus major muscle (series 2/ image 45). Additional soft tissue gas extends into the left obturator fossa (series 2/ image 35). This is likely iatrogenic, related to recent procedure. No residual fluid collection/abscess is evident within the soft tissues on the left. 4.1 x 2.0 x 2.2 cm subcutaneous fluid collection along the right anterior gluteal fold (series 2/ image 52; series 4/ image 94). Musculoskeletal: Visualized osseous structures are within normal limits. IMPRESSION: Soft tissue gas extending from the left medial gluteal region into the left ischiorectal fossa and left obturator region, likely related to recent procedure. No residual fluid collection/abscess on the left. 4.1 x 2.0 x 2.2 cm subcutaneous fluid collection along the right anterior gluteal fold, abscess not excluded. Electronically Signed   By: SJulian HyM.D.   On: 02/24/2016 11:55  Prior to Admission medications   Medication Sig Start Date End Date Taking? Authorizing Provider  Benzocaine (BOIL-EASE EX) Apply 1 application topically 2 (two) times daily.   Yes Historical Provider, MD  esomeprazole (NEXIUM) 40 MG capsule Take 40 mg by mouth daily as needed (reflux).    Yes Historical Provider, MD  HYDROcodone-acetaminophen (NORCO/VICODIN) 5-325 MG tablet Take 2 tablets by mouth every 4 (four) hours as  needed. 02/20/16  Yes Hollace Kinnier Sofia, PA-C  ondansetron (ZOFRAN ODT) 4 MG disintegrating tablet Take 1 tablet (4 mg total) by mouth every 8 (eight) hours as needed for nausea or vomiting. 02/21/16  Yes Blanchie Dessert, MD  sulfamethoxazole-trimethoprim (BACTRIM DS,SEPTRA DS) 800-160 MG tablet Take 1 tablet by mouth 2 (two) times daily. 02/20/16 02/27/16 Yes Hollace Kinnier Sofia, PA-C  dicyclomine (BENTYL) 20 MG tablet Take 1 tablet (20 mg total) by mouth 3 (three) times daily as needed (abdominal cramps). Patient not taking: Reported on 02/24/2016 10/23/15   [facility] Ward, PA-C  diphenhydrAMINE (BENADRYL) 25 MG tablet Take 1 tablet (25 mg total) by mouth every 6 (six) hours as needed for itching. Patient not taking: Reported on 10/23/2015 12/20/14   Julianne Rice, MD    Medications: . docusate sodium  100 mg Oral BID  . enoxaparin (LOVENOX) injection  40 mg Subcutaneous Q24H  . pantoprazole  40 mg Oral Daily  . piperacillin-tazobactam (ZOSYN)  IV  3.375 g Intravenous Q8H  . vancomycin  500 mg Intravenous Q8H   . dextrose 5 % and 0.45 % NaCl with KCl 20 mEq/L 100 mL/hr at 02/26/16 0646    ABSCESS RECTAL  02/24/16   Special Requests NONE   Gram Stain ABUNDANT WBC PRESENT, PREDOMINANTLY PMN  ABUNDANT GRAM POSITIVE RODS  ABUNDANT GRAM NEGATIVE COCCOBACILLI  FEW GRAM POSITIVE COCCI IN PAIRS       Culture NO GROWTH < 24 HOURS   Report Status PENDING     Assessment/Plan Left perirectal abscess  S/p Incision and Drainage complex left ischiorectal abscess, 02/24/16, Dr.Faera Byerl Hx of tobacco use Anemia - recheck, type, anemia panel FEN: IV fluids/ Regular diet ID:  Day 3 Zosyn and vancomycin DVT:  Lovenox  Plan: We've repacked the site and we will start her on sitz baths tomorrow, after the current packing is removed. Recheck CBC, anemia panel, hemoglobin A1c. Add probiotic. Continue current antibiotics until we have completed culture results.   LOS: 0 days     Candace Ramus 02/26/2016 [phone]

## 2016-02-26 NOTE — Progress Notes (Signed)
CRITICAL VALUE ALERT  Critical value received:  Hgb=6.9  Date of notification:  02/26/16  Time of notification:  2633  Critical value read back:Yes.    Nurse who received alert:  C. Kathyann Spaugh  MD notified (1st page):  Chucky May  Time of first page:  [phone]  MD notified (2nd page):  Time of second page:  Responding MD:  Chucky May  Time MD responded:  [phone]  Awaiting orders.  Will endorse to day shift RN appropriately.

## 2016-02-27 ENCOUNTER — Encounter (HOSPITAL_COMMUNITY): Payer: Self-pay | Admitting: Internal Medicine

## 2016-02-27 DIAGNOSIS — L03317 Cellulitis of buttock: Secondary | ICD-10-CM | POA: Diagnosis present

## 2016-02-27 DIAGNOSIS — Z833 Family history of diabetes mellitus: Secondary | ICD-10-CM | POA: Diagnosis not present

## 2016-02-27 DIAGNOSIS — D638 Anemia in other chronic diseases classified elsewhere: Secondary | ICD-10-CM | POA: Diagnosis present

## 2016-02-27 DIAGNOSIS — K219 Gastro-esophageal reflux disease without esophagitis: Secondary | ICD-10-CM | POA: Diagnosis present

## 2016-02-27 DIAGNOSIS — E119 Type 2 diabetes mellitus without complications: Secondary | ICD-10-CM | POA: Diagnosis present

## 2016-02-27 DIAGNOSIS — K611 Rectal abscess: Secondary | ICD-10-CM | POA: Diagnosis present

## 2016-02-27 DIAGNOSIS — Z720 Tobacco use: Secondary | ICD-10-CM | POA: Diagnosis not present

## 2016-02-27 DIAGNOSIS — R7309 Other abnormal glucose: Secondary | ICD-10-CM | POA: Diagnosis not present

## 2016-02-27 DIAGNOSIS — K5900 Constipation, unspecified: Secondary | ICD-10-CM | POA: Diagnosis present

## 2016-02-27 DIAGNOSIS — K613 Ischiorectal abscess: Secondary | ICD-10-CM | POA: Diagnosis present

## 2016-02-27 DIAGNOSIS — R7303 Prediabetes: Secondary | ICD-10-CM | POA: Diagnosis not present

## 2016-02-27 DIAGNOSIS — E440 Moderate protein-calorie malnutrition: Secondary | ICD-10-CM | POA: Diagnosis present

## 2016-02-27 DIAGNOSIS — Z79899 Other long term (current) drug therapy: Secondary | ICD-10-CM | POA: Diagnosis not present

## 2016-02-27 DIAGNOSIS — Z888 Allergy status to other drugs, medicaments and biological substances status: Secondary | ICD-10-CM | POA: Diagnosis not present

## 2016-02-27 DIAGNOSIS — F17210 Nicotine dependence, cigarettes, uncomplicated: Secondary | ICD-10-CM | POA: Diagnosis present

## 2016-02-27 DIAGNOSIS — Z885 Allergy status to narcotic agent status: Secondary | ICD-10-CM | POA: Diagnosis not present

## 2016-02-27 LAB — CBC
HEMATOCRIT: 23.7 % — AB (ref 36.0–46.0)
HEMOGLOBIN: 8 g/dL — AB (ref 12.0–15.0)
MCH: 27 pg (ref 26.0–34.0)
MCHC: 33.8 g/dL (ref 30.0–36.0)
MCV: 80.1 fL (ref 78.0–100.0)
Platelets: 295 10*3/uL (ref 150–400)
RBC: 2.96 MIL/uL — AB (ref 3.87–5.11)
RDW: 13.6 % (ref 11.5–15.5)
WBC: 7.1 10*3/uL (ref 4.0–10.5)

## 2016-02-27 LAB — GLUCOSE, CAPILLARY
GLUCOSE-CAPILLARY: 85 mg/dL (ref 65–99)
Glucose-Capillary: 90 mg/dL (ref 65–99)

## 2016-02-27 LAB — BASIC METABOLIC PANEL
Anion gap: 6 (ref 5–15)
BUN: <5 mg/dL — ABNORMAL LOW (ref 6–20)
CHLORIDE: 104 mmol/L (ref 101–111)
CO2: 27 mmol/L (ref 22–32)
Calcium: 8.2 mg/dL — ABNORMAL LOW (ref 8.9–10.3)
Creatinine, Ser: 0.8 mg/dL (ref 0.44–1.00)
GFR calc non Af Amer: >60 mL/min (ref >60)
Glucose, Bld: 110 mg/dL — ABNORMAL HIGH (ref 65–99)
POTASSIUM: 4.3 mmol/L (ref 3.5–5.1)
SODIUM: 137 mmol/L (ref 135–145)

## 2016-02-27 LAB — I-STAT BETA HCG BLOOD, ED (NOT ORDERABLE)

## 2016-02-27 LAB — PREALBUMIN: PREALBUMIN: 12 mg/dL — AB (ref 18–38)

## 2016-02-27 MED ORDER — TAB-A-VITE/IRON PO TABS
1.0000 | ORAL_TABLET | Freq: Every day | ORAL | Status: DC
  Administered 2016-02-27 – 2016-02-28 (×2): 1 via ORAL
  Filled 2016-02-27 (×3): qty 1

## 2016-02-27 MED ORDER — SENNA 8.6 MG PO TABS
2.0000 | ORAL_TABLET | Freq: Every day | ORAL | Status: DC
  Administered 2016-02-27 – 2016-02-28 (×2): 17.2 mg via ORAL
  Filled 2016-02-27 (×2): qty 2

## 2016-02-27 MED ORDER — FERROUS GLUCONATE 324 (38 FE) MG PO TABS
324.0000 mg | ORAL_TABLET | Freq: Two times a day (BID) | ORAL | Status: DC
  Administered 2016-02-27 – 2016-02-28 (×4): 324 mg via ORAL
  Filled 2016-02-27 (×7): qty 1

## 2016-02-27 NOTE — Progress Notes (Signed)
3 Days Post-Op  Subjective: Still has purulent drainage coming from the site, the packing is pretty wet.  I am going to let her get in the Sitz bath and soak. The pull dressing and go to TID Sitz baths.  I have ask Medicine to see about anemia and Prediabetes.  She is distended and complains of constipation for the last 3 days with this.   Objective: Vital signs in last 24 hours: Temp:  [97.7 F (36.5 C)-98 F (36.7 C)] 98 F (36.7 C) (12/27 0523) Pulse Rate:  [73-88] 88 (12/27 0523) Resp:  [16-18] 16 (12/27 0523) BP: (117-130)/(68-84) 130/84 (12/27 0523) SpO2:  [99 %-100 %] 100 % (12/27 0523) Last BM Date: 02/23/16   820 PO 2340 IV - will decrease to [facility] Urine 1200 Afebrile, VSS Repeat CBC yesterday 7.1, HBG, TBIC low at 162, Saturation 39%, Retic is low normal A1C is 5.7, this is considered prediabetes  Intake/Output from previous day: 12/26 0701 - 12/27 0700 In: 3310 [P.O.:820; I.V.:2340; IV Piggyback:150] Out: 1200 [Urine:1200] Intake/Output this shift: Total I/O In: 120 [P.O.:120] Out: 1100 [Urine:1100]  General appearance: alert, cooperative and no distress GI: soft, distended, good BS, not tender.   Skin: open site is stll draining purulent fluid.  We will get packing out after some time in the Sitz bath.  No erythema around the open site.  Penrose in place.  Lab Results:   Recent Labs  02/26/16 0545 02/26/16 1334  WBC 8.7 8.4  HGB 6.9* 7.1*  HCT 20.7* 21.4*  PLT 291 315    BMET  Recent Labs  02/25/16 0356 02/26/16 0545  NA 136 138  K 4.5 4.6  CL 108 108  CO2 23 22  GLUCOSE 218* 132*  BUN 6 7  CREATININE 0.70 0.86  CALCIUM 7.3* 7.4*   PT/INR No results for input(s): LABPROT, INR in the last 72 hours.   Recent Labs Lab 02/24/16 0844  AST 17  ALT 12*  ALKPHOS 66  BILITOT 0.6  PROT 6.1*  ALBUMIN 1.8*     Lipase     Component Value Date/Time   LIPASE 14 10/23/2015 1627     Studies/Results: No results found.  Medications: .  docusate sodium  100 mg Oral BID  . enoxaparin (LOVENOX) injection  40 mg Subcutaneous Q24H  . ferrous gluconate  324 mg Oral BID WC  . multivitamins with iron  1 tablet Oral Daily  . pantoprazole  40 mg Oral Daily  . piperacillin-tazobactam (ZOSYN)  IV  3.375 g Intravenous Q8H  . vancomycin  500 mg Intravenous Q8H   Specimen Description ABSCESS RECTAL   Special Requests NONE   Gram Stain ABUNDANT WBC PRESENT, PREDOMINANTLY PMN  ABUNDANT GRAM POSITIVE RODS  ABUNDANT GRAM NEGATIVE COCCOBACILLI  FEW GRAM POSITIVE COCCI IN PAIRS       Culture FEW DIPHTHEROIDS(CORYNEBACTERIUM SPECIES)  Standardized susceptibility testing for this organism is not available.  NO ANAEROBES ISOLATED; CULTURE IN PROGRESS FOR 5 DAYS        Assessment/Plan Left perirectalabscess  S/p Incision and Drainage complex left ischiorectal abscess, 02/24/16, Dr.Faera Byerl Hx of tobacco use Family Hx of diabetes Anemia -I have ask Medicine to see Prediabetes with A1C 5.7 - Medicine to see FEN: IV fluids/ Regular diet ID:  Day 3 Zosyn and vancomycin 02/26/16  =>> start her on Augmentin tomorrow, stop vancomycin today.   DVT:  Lovenox    Plan:  I am going to start her on FE for her anemia and  have ask Medicine to see and make recommendations.  Her A1C is at the prediabetic level and they will make recommendations on this also. Sitz baths, decrease IV fluids to [facility], aim for DC tomorrow.  Recheck labs in AM.    LOS: 0 days    Nicole Browning 02/27/2016 [phone]

## 2016-02-27 NOTE — Progress Notes (Signed)
Pt refused sitz bath. Stated that she is tired and wanted to rest. Will offer again in the AM.

## 2016-02-27 NOTE — Progress Notes (Signed)
Pt. Did sitz bath and packing was removed. Did not re-pack wound per MD order. Placed abd pad inside of mesh panties.

## 2016-02-27 NOTE — Consult Note (Signed)
[facility] Medical Consultation  Nicole Browning QVZ:[id number] DOB: March 20, 1970 DOA: 02/24/2016 PCP: No PCP Per Patient   Requesting physician: will jennings Date of consultation: 02/27/16 Reason for consultation: anemia  Impression/Recommendations Principal Problem:   Perirectal abscess Active Problems:   GERD (gastroesophageal reflux disease)   Anemia   Tobacco use   Elevated hemoglobin A1c  #1. Anemia. Normocytic. Likely acute on chronic related to perirectal abscess and recent I and D per surgery. Denies symptoms.  anemia panel done yesterday reveals iron 64 TIBC 162 saturation ratios 39, RBCs 2.6  retic 13.3. Chart review indicates 6 days ago hemoglobin was 9.1, 4 months ago hemoglobin was 10.1 and one year ago hemoglobin was 11.1.  -continue iron supplement started by surgery -suspect her baseline closer to 9-10 range and recent drop due to abscess and procedure associated with that -recheck in am -no nsaids -fobt when able -case management for PCP to ensure OP follow up  #2. Elevated hemoglobin A1c. History of diabetes. Positive family history for diabetes. Hemoglobin A1c 5.7 serum glucose 218 2 days ago and 132 yesterday.  -Carb modified diet  -nutritional consult -will need OP monitoring  #3. Perirectal abscess. Per general surgery. Recent I & D. Kidneys to drain and bleed. Review indicates frequent "boils" and/or infections typically in axilla, groin area concerning for hidradenitis suppurativa -per general surgery -OP follow up -controlled glucose  #4. Tobacco use  -Cessation counseling offered    [name] will follow up tomorrow. Please contact [name] if can be of assistance in the meanwhile. Thank you for this consultation.  Chief Complaint: anemia  HPI: 45 year old past medical history significant for tobacco use, GERD admitted 3 days ago by surgery service for perirectal abscess.  6 days ago she was seen in the emergency department and at that time underwent  drain and packing. The packing fell out she experienced copious amounts of drainage and bleeding. 3 days ago she came back to the emergency department for same. She underwent I & D her general surgery at that time. He was provided with IV antibiotics and supportive therapy. The time of admission her hemoglobin was 9.1. Yesterday hemoglobin was 7.1 we are asked to consult for anemia.  She states she takes Aleve to 3 times a week when "my head hurts". She states her menstrual cycle is usually 3-4 days and would not describe the bleeding is heavy. She reports using to 4 tampons in a 24-hour period. She denies any family history of anemia.   Review of Systems: Patient denies headache dizziness shortness of breath chest pain palpitation lower strep and the edema. She denies dysuria hematuria frequency or urgency. She does report a tendency towards constipation. She denies fever cough nausea vomiting decreased oral intake or unintentional weight loss. Denies headache visual disturbances numbness tingling of extremities   Past Medical History:  Diagnosis Date  . Anemia   . GERD (gastroesophageal reflux disease)   . Perirectal abscess   . Tobacco use    Past Surgical History:  Procedure Laterality Date  . IRRIGATION AND DEBRIDEMENT ABSCESS N/A 02/24/2016   Procedure: IRRIGATION AND DEBRIDEMENT ABSCESS;  Surgeon: Stark Klein, MD;  Location: [place];  Service: General;  Laterality: N/A;   Social History:  reports that she has been smoking Cigarettes.  She has a 9.24 pack-year smoking history. She has never used smokeless tobacco. She reports that she drinks about 1.2 oz of alcohol per week . She reports that she does not use drugs.  Allergies  Allergen  Reactions  . Percocet [Oxycodone-Acetaminophen] Nausea Only  . Valium [Diazepam] Nausea Only   Family History  Problem Relation Age of Onset  . Diabetes Mother   . COPD Father     Prior to Admission medications   Medication Sig Start Date End  Date Taking? Authorizing Provider  Benzocaine (BOIL-EASE EX) Apply 1 application topically 2 (two) times daily.   Yes Historical Provider, MD  esomeprazole (NEXIUM) 40 MG capsule Take 40 mg by mouth daily as needed (reflux).    Yes Historical Provider, MD  HYDROcodone-acetaminophen (NORCO/VICODIN) 5-325 MG tablet Take 2 tablets by mouth every 4 (four) hours as needed. 02/20/16  Yes Hollace Kinnier Sofia, PA-C  ondansetron (ZOFRAN ODT) 4 MG disintegrating tablet Take 1 tablet (4 mg total) by mouth every 8 (eight) hours as needed for nausea or vomiting. 02/21/16  Yes Blanchie Dessert, MD  sulfamethoxazole-trimethoprim (BACTRIM DS,SEPTRA DS) 800-160 MG tablet Take 1 tablet by mouth 2 (two) times daily. 02/20/16 02/27/16 Yes Hollace Kinnier Sofia, PA-C  dicyclomine (BENTYL) 20 MG tablet Take 1 tablet (20 mg total) by mouth 3 (three) times daily as needed (abdominal cramps). Patient not taking: Reported on 02/24/2016 10/23/15   [facility] Ward, PA-C  diphenhydrAMINE (BENADRYL) 25 MG tablet Take 1 tablet (25 mg total) by mouth every 6 (six) hours as needed for itching. Patient not taking: Reported on 10/23/2015 12/20/14   Julianne Rice, MD   Physical Exam: Blood pressure 130/84, pulse 88, temperature 98 F (36.7 C), temperature source Oral, resp. rate 16, height 5' 7.5" (1.715 m), weight 59 kg (130 lb), last menstrual period 02/02/2016, SpO2 100 %. Vitals:   02/26/16 2221 02/27/16 0523  BP: 117/68 130/84  Pulse: 74 88  Resp: 16 16  Temp: 97.7 F (36.5 C) 98 F (36.7 C)     General:  Lying on her left side in bed eating breakfast no acute distress  Eyes: Pupils equal round reactive to light EOMI no scleral icterus  ENT: Ears clear nose without drainage oropharynx without erythema or exudate . Poor dentition  Neck: Supple full range of motion no lymphadenopathy  Cardiovascular: Regular rate and rhythm no murmur gallop or rub no lower extremity edema  Respiratory: Sounds are slightly diminished clear  bilaterally no loose no crackles  Abdomen: Soft, slightly distended positive bowel sounds throughout no guarding or rebounding  Skin: Warm and dry no rash or lesions, dressing to left buttock area intact  Musculoskeletal: Joints without swelling/erythema full range of motion  Psychiatric: Cooperative, calm  Neurologic: Cranial nerves II through XII grossly intact speech clear facial symmetry  Labs on Admission:  Basic Metabolic Panel:  Recent Labs Lab 02/21/16 0919 02/24/16 0844 02/25/16 0356 02/26/16 0545  NA 138 137 136 138  K 3.5 2.9* 4.5 4.6  CL 99* 101 108 108  CO2  --  27 23 22   GLUCOSE 98 123* 218* 132*  BUN 4* 5* 6 7  CREATININE 0.70 0.75 0.70 0.86  CALCIUM  --  8.4* 7.3* 7.4*   Liver Function Tests:  Recent Labs Lab 02/24/16 0844  AST 17  ALT 12*  ALKPHOS 66  BILITOT 0.6  PROT 6.1*  ALBUMIN 1.8*   No results for input(s): LIPASE, AMYLASE in the last 168 hours. No results for input(s): AMMONIA in the last 168 hours. CBC:  Recent Labs Lab 02/21/16 0906 02/21/16 0919 02/24/16 0844 02/25/16 0356 02/26/16 0545 02/26/16 1334  WBC 26.5*  --  8.6 11.2* 8.7 8.4  NEUTROABS 24.1*  --  5.9  --   --   --   HGB 9.1* 9.5* 8.1* 7.3* 6.9* 7.1*  HCT 27.3* 28.0* 23.4* 21.6* 20.7* 21.4*  MCV 81.7  --  79.3 79.1 80.2 80.5  PLT 384  --  368 312 291 315   Cardiac Enzymes: No results for input(s): CKTOTAL, CKMB, CKMBINDEX, TROPONINI in the last 168 hours. BNP: Invalid input(s): POCBNP CBG: No results for input(s): GLUCAP in the last 168 hours.  Radiological Exams on Admission: No results found.  EKG:   Time spent: 70 minutes  [facility]   If 7PM-7AM, please contact night-coverage [email or web address] Password [facility] 02/27/2016, 10:32 AM

## 2016-02-27 NOTE — Progress Notes (Signed)
Pharmacy Antibiotic Note  Nicole Browning is a 45 y.o. female in ED on 02/24/2016 with sepsis- possible source buttock abscess.  Pharmacy has been consulted for Vancomycin and Zosyn dosing.  Day #4 of abx for sepsis secondary to abscess on buttocks. S/p I&D with penrose drain placement. Afebrile, WBC wnl.  Plan: Continue Zosyn 3.375 gm IV q8h (4 hour infusion) Monitor clinical picture, renal function F/U C&S, abx deescalation / LOT  Consider de-escalation to Augmentin (surgery plans to switch tomorrow)  Height: 5' 7.5" (171.5 cm) Weight: 130 lb (59 kg) IBW/kg (Calculated) : 62.75  Temp (24hrs), Avg:97.8 F (36.6 C), Min:97.7 F (36.5 C), Max:98 F (36.7 C)   Recent Labs Lab 02/21/16 0919 02/24/16 0844 02/24/16 0858 02/24/16 1239 02/25/16 0356 02/26/16 0545 02/26/16 1334 02/27/16 1040  WBC  --  8.6  --   --  11.2* 8.7 8.4 7.1  CREATININE 0.70 0.75  --   --  0.70 0.86  --  0.80  LATICACIDVEN  --   --  2.19* 0.98  --   --   --   --     Estimated Creatinine Clearance: 82.7 mL/min (by C-G formula based on SCr of 0.8 mg/dL).    Allergies  Allergen Reactions  . Percocet [Oxycodone-Acetaminophen] Nausea Only  . Valium [Diazepam] Nausea Only    Antimicrobials this admission: Zosyn 12/24 >> Vancomycin 12/24 >>  Dose adjustments this admission:  Microbiology results: 12/24 Blood: ngtd 12/24 Abscess Cx: Few diphtheroids (pending)  Thank you for allowing pharmacy to be a part of this patient's care.  Elenor Quinones, PharmD, BCPS Clinical Pharmacist Pager [phone] 02/27/2016 1:37 PM

## 2016-02-27 NOTE — Care Management Note (Signed)
Case Management Note  Patient Details  Name: Nicole Browning MRN: [id number] Date of Birth: 08-26-1970  Subjective/Objective:                    Action/Plan:  Consult for PCP .   Discussed with patient . Patient has Medicaid, Medicaid assigns PCP . Patient aware , she moved here from [place] and has not changed MD through [facility] . Patient stated she is aware on how to do this. Patient had an appointment at [place] but stated she did not go, no reason given. Patient is does want to go to [facility] until she has completed process with Medicaid to change MD's.  Provided information on [facility] [place] but was told patient will have to call herself to make appointment . Patient aware and voiced understanding  Expected Discharge Date:                  Expected Discharge Plan:     In-House Referral:     Discharge planning Services  CM Consult, [facility]  Post Acute Care Choice:    Choice offered to:  Patient  DME Arranged:    DME Agency:     HH Arranged:    [place] Agency:     Status of Service:  In process, will continue to follow  If discussed at Long Length of Stay Meetings, dates discussed:    Additional Comments:  Marilu Favre, RN 02/27/2016, 10:25 AM

## 2016-02-28 DIAGNOSIS — R7309 Other abnormal glucose: Secondary | ICD-10-CM

## 2016-02-28 DIAGNOSIS — D638 Anemia in other chronic diseases classified elsewhere: Secondary | ICD-10-CM

## 2016-02-28 LAB — BASIC METABOLIC PANEL
ANION GAP: 6 (ref 5–15)
BUN: 5 mg/dL — ABNORMAL LOW (ref 6–20)
CHLORIDE: 102 mmol/L (ref 101–111)
CO2: 27 mmol/L (ref 22–32)
Calcium: 8.1 mg/dL — ABNORMAL LOW (ref 8.9–10.3)
Creatinine, Ser: 0.74 mg/dL (ref 0.44–1.00)
GFR calc non Af Amer: >60 mL/min (ref >60)
Glucose, Bld: 89 mg/dL (ref 65–99)
POTASSIUM: 4.1 mmol/L (ref 3.5–5.1)
Sodium: 135 mmol/L (ref 135–145)

## 2016-02-28 LAB — GLUCOSE, CAPILLARY
GLUCOSE-CAPILLARY: 85 mg/dL (ref 65–99)
GLUCOSE-CAPILLARY: 88 mg/dL (ref 65–99)
Glucose-Capillary: 100 mg/dL — ABNORMAL HIGH (ref 65–99)

## 2016-02-28 LAB — URINALYSIS, ROUTINE W REFLEX MICROSCOPIC
Bilirubin Urine: NEGATIVE
GLUCOSE, UA: NEGATIVE mg/dL
KETONES UR: NEGATIVE mg/dL
Leukocytes, UA: NEGATIVE
NITRITE: NEGATIVE
PH: 7 (ref 5.0–8.0)
PROTEIN: NEGATIVE mg/dL
Specific Gravity, Urine: 1.01 (ref 1.005–1.030)

## 2016-02-28 LAB — CBC
HCT: 20.1 % — ABNORMAL LOW (ref 36.0–46.0)
Hemoglobin: 6.8 g/dL — CL (ref 12.0–15.0)
MCH: 26.9 pg (ref 26.0–34.0)
MCHC: 33.8 g/dL (ref 30.0–36.0)
MCV: 79.4 fL (ref 78.0–100.0)
PLATELETS: 295 10*3/uL (ref 150–400)
RBC: 2.53 MIL/uL — ABNORMAL LOW (ref 3.87–5.11)
RDW: 13.4 % (ref 11.5–15.5)
WBC: 7.3 10*3/uL (ref 4.0–10.5)

## 2016-02-28 LAB — PREPARE RBC (CROSSMATCH)

## 2016-02-28 LAB — HIV ANTIBODY (ROUTINE TESTING W REFLEX): HIV SCREEN 4TH GENERATION: NONREACTIVE

## 2016-02-28 MED ORDER — GLUCERNA SHAKE PO LIQD
237.0000 mL | Freq: Every day | ORAL | Status: DC
  Administered 2016-02-28: 237 mL via ORAL

## 2016-02-28 MED ORDER — ENOXAPARIN SODIUM 40 MG/0.4ML ~~LOC~~ SOLN
40.0000 mg | SUBCUTANEOUS | Status: DC
Start: 2016-02-29 — End: 2016-02-29

## 2016-02-28 MED ORDER — SODIUM CHLORIDE 0.9 % IV SOLN
Freq: Once | INTRAVENOUS | Status: AC
  Administered 2016-02-28: 08:00:00 via INTRAVENOUS

## 2016-02-28 NOTE — Progress Notes (Signed)
PROGRESS NOTE        PATIENT DETAILS Name: Nicole Browning Age: 45 y.o. Sex: female Date of Birth: [birth year] Admit Date: 02/24/2016 Admitting Physician Stark Klein, MD PCP:No PCP Per Patient  Brief Narrative: Patient is a 45 y.o. female with past medical history of GERD, tobacco abuse, normocytic anemia who was admitted by the general surgery service for evaluation/treatment of perirectal abscess. Hospitalist service consulted for elevated A1c and anemia. See below for further details.  Subjective: Awake and alert-no complaints apart from pain at the operative site.  Assessment/Plan: Anemia: Borderline chronic microcytic anemia-but iron panel not consistent with iron deficiency-anemia likely worsened due to acute illness. Denies history of menorrhagia as well. Agree with transfusion of PRBC-would recommend hematology consult at some point.  Minimally elevated A1c: No intervention needed at this point  Tobacco abuse: Counseled  Needs to be established with PCP for primary care needs.  Perirectal abscess: Deferred to primary service  Time spent: 25 minutes-Greater than 50% of this time was spent in counseling, explanation of diagnosis, planning of further management, and coordination of care.  MEDICATIONS: Anti-infectives    Start     Dose/Rate Route Frequency Ordered Stop   02/24/16 1800  piperacillin-tazobactam (ZOSYN) IVPB 3.375 g     3.375 g 12.5 mL/hr over 240 Minutes Intravenous Every 8 hours 02/24/16 0959     02/24/16 1800  vancomycin (VANCOCIN) 500 mg in sodium chloride 0.9 % 100 mL IVPB  Status:  Discontinued     500 mg 100 mL/hr over 60 Minutes Intravenous Every 8 hours 02/24/16 0959 02/27/16 0952   02/24/16 1000  piperacillin-tazobactam (ZOSYN) IVPB 3.375 g  Status:  Discontinued     3.375 g 100 mL/hr over 30 Minutes Intravenous  Once 02/24/16 0949 02/24/16 0959   02/24/16 1000  vancomycin (VANCOCIN) IVPB 1000 mg/200 mL premix  Status:   Discontinued     1,000 mg 200 mL/hr over 60 Minutes Intravenous  Once 02/24/16 0949 02/24/16 0959   02/24/16 1000  piperacillin-tazobactam (ZOSYN) IVPB 3.375 g     3.375 g 100 mL/hr over 30 Minutes Intravenous  Once 02/24/16 0959 02/24/16 1113   02/24/16 1000  vancomycin (VANCOCIN) IVPB 1000 mg/200 mL premix     1,000 mg 200 mL/hr over 60 Minutes Intravenous  Once 02/24/16 0959 02/24/16 1236      Scheduled Meds: . sodium chloride   Intravenous Once  . docusate sodium  100 mg Oral BID  . [START ON 02/29/2016] enoxaparin (LOVENOX) injection  40 mg Subcutaneous Q24H  . feeding supplement (GLUCERNA SHAKE)  237 mL Oral QHS  . ferrous gluconate  324 mg Oral BID WC  . multivitamins with iron  1 tablet Oral Daily  . pantoprazole  40 mg Oral Daily  . piperacillin-tazobactam (ZOSYN)  IV  3.375 g Intravenous Q8H  . senna  2 tablet Oral Daily   Continuous Infusions: . dextrose 5 % and 0.45 % NaCl with KCl 20 mEq/L 10 mL/hr at 02/27/16 0952   PRN Meds:.acetaminophen **OR** acetaminophen, bisacodyl, diphenhydrAMINE **OR** diphenhydrAMINE, HYDROcodone-acetaminophen, HYDROmorphone (DILAUDID) injection, [COMPLETED] ketorolac **FOLLOWED BY** ketorolac, ondansetron **OR** ondansetron (ZOFRAN) IV, senna-docusate, simethicone, zolpidem   PHYSICAL EXAM: Vital signs: Vitals:   02/27/16 1413 02/27/16 2034 02/28/16 0630 02/28/16 1440  BP: 114/72 122/70 114/67 116/77  Pulse: 95 89 87 81  Resp: 18 16 16 16   Temp: 98.5  F (36.9 C) 98.6 F (37 C) 98.4 F (36.9 C) 97.9 F (36.6 C)  TempSrc: Oral Oral Oral Oral  SpO2: 100% 100% 100% 100%  Weight:      Height:       Filed Weights   02/24/16 0830  Weight: 59 kg (130 lb)   Body mass index is 20.06 kg/m.   General appearance :Awake, alert, not in any distress. Speech Clear. Not toxic Looking Eyes:, pupils equally reactive to light and accomodation,no scleral icterus.Pink conjunctiva HEENT: Atraumatic and Normocephalic Neck: supple, no JVD. No  cervical lymphadenopathy. No thyromegaly Resp:Good air entry bilaterally, no added sounds  CVS: S1 S2 regular, no murmurs.  GI: Bowel sounds present, Non tender and not distended with no gaurding, rigidity or rebound.No organomegaly Extremities: B/L Lower Ext shows no edema, both legs are warm to touch Neurology:  speech clear,Non focal, sensation is grossly intact. Psychiatric: Normal judgment and insight. Alert and oriented x 3. Normal mood. Musculoskeletal:gait appears to be normal.No digital cyanosis   I have personally reviewed following labs and imaging studies  LABORATORY DATA: CBC:  Recent Labs Lab 02/24/16 0844 02/25/16 0356 02/26/16 0545 02/26/16 1334 02/27/16 1040 02/28/16 0323  WBC 8.6 11.2* 8.7 8.4 7.1 7.3  NEUTROABS 5.9  --   --   --   --   --   HGB 8.1* 7.3* 6.9* 7.1* 8.0* 6.8*  HCT 23.4* 21.6* 20.7* 21.4* 23.7* 20.1*  MCV 79.3 79.1 80.2 80.5 80.1 79.4  PLT 368 312 291 315 295 800    Basic Metabolic Panel:  Recent Labs Lab 02/24/16 0844 02/25/16 0356 02/26/16 0545 02/27/16 1040 02/28/16 0323  NA 137 136 138 137 135  K 2.9* 4.5 4.6 4.3 4.1  CL 101 108 108 104 102  CO2 27 23 22 27 27   GLUCOSE 123* 218* 132* 110* 89  BUN 5* 6 7 <5* 5*  CREATININE 0.75 0.70 0.86 0.80 0.74  CALCIUM 8.4* 7.3* 7.4* 8.2* 8.1*    GFR: Estimated Creatinine Clearance: 82.7 mL/min (by C-G formula based on SCr of 0.74 mg/dL).  Liver Function Tests:  Recent Labs Lab 02/24/16 0844  AST 17  ALT 12*  ALKPHOS 66  BILITOT 0.6  PROT 6.1*  ALBUMIN 1.8*   No results for input(s): LIPASE, AMYLASE in the last 168 hours. No results for input(s): AMMONIA in the last 168 hours.  Coagulation Profile:  Recent Labs Lab 02/24/16 0844  INR 1.19    Cardiac Enzymes: No results for input(s): CKTOTAL, CKMB, CKMBINDEX, TROPONINI in the last 168 hours.  BNP (last 3 results) No results for input(s): PROBNP in the last 8760 hours.  HbA1C:  Recent Labs  02/26/16 1334    HGBA1C 5.7*    CBG:  Recent Labs Lab 02/27/16 1744 02/27/16 2346 02/28/16 0723 02/28/16 1219  GLUCAP 85 90 85 100*    Lipid Profile: No results for input(s): CHOL, HDL, LDLCALC, TRIG, CHOLHDL, LDLDIRECT in the last 72 hours.  Thyroid Function Tests: No results for input(s): TSH, T4TOTAL, FREET4, T3FREE, THYROIDAB in the last 72 hours.  Anemia Panel:  Recent Labs  02/26/16 1334  VITAMINB12 792  FOLATE 9.4  FERRITIN 180  TIBC 162*  IRON 64  RETICCTPCT 0.5    Urine analysis:    Component Value Date/Time   COLORURINE YELLOW 02/28/2016 1220   APPEARANCEUR CLEAR 02/28/2016 1220   LABSPEC 1.010 02/28/2016 1220   PHURINE 7.0 02/28/2016 1220   GLUCOSEU NEGATIVE 02/28/2016 1220   HGBUR SMALL (A) 02/28/2016 1220  BILIRUBINUR NEGATIVE 02/28/2016 1220   KETONESUR NEGATIVE 02/28/2016 1220   PROTEINUR NEGATIVE 02/28/2016 1220   NITRITE NEGATIVE 02/28/2016 1220   LEUKOCYTESUR NEGATIVE 02/28/2016 1220    Sepsis Labs: Lactic Acid, Venous    Component Value Date/Time   LATICACIDVEN 0.98 02/24/2016 1239    MICROBIOLOGY: Recent Results (from the past 240 hour(s))  Blood culture (routine x 2)     Status: None (Preliminary result)   Collection Time: 02/24/16 10:00 AM  Result Value Ref Range Status   Specimen Description BLOOD LEFT ANTECUBITAL  Final   Special Requests   Final    BOTTLES DRAWN AEROBIC AND ANAEROBIC  10CC AER AND 5CC ANA   Culture NO GROWTH 4 DAYS  Final   Report Status PENDING  Incomplete  Blood culture (routine x 2)     Status: None (Preliminary result)   Collection Time: 02/24/16 10:10 AM  Result Value Ref Range Status   Specimen Description BLOOD RIGHT ANTECUBITAL  Final   Special Requests BOTTLES DRAWN AEROBIC ONLY  5CC  Final   Culture NO GROWTH 4 DAYS  Final   Report Status PENDING  Incomplete  Aerobic/Anaerobic Culture (surgical/deep wound)     Status: None (Preliminary result)   Collection Time: 02/24/16  6:04 PM  Result Value Ref Range  Status   Specimen Description ABSCESS RECTAL  Final   Special Requests NONE  Final   Gram Stain   Final    ABUNDANT WBC PRESENT, PREDOMINANTLY PMN ABUNDANT GRAM POSITIVE RODS ABUNDANT GRAM NEGATIVE COCCOBACILLI FEW GRAM POSITIVE COCCI IN PAIRS    Culture   Final    FEW DIPHTHEROIDS(CORYNEBACTERIUM SPECIES) Standardized susceptibility testing for this organism is not available. NO ANAEROBES ISOLATED; CULTURE IN PROGRESS FOR 5 DAYS    Report Status PENDING  Incomplete    RADIOLOGY STUDIES/RESULTS: Ct Pelvis W Contrast  Result Date: 02/24/2016 CLINICAL DATA:  Left buttock abscess, status post I&D EXAM: CT PELVIS WITH CONTRAST TECHNIQUE: Multidetector CT imaging of the pelvis was performed using the standard protocol following the bolus administration of intravenous contrast. CONTRAST:  173m ISOVUE-300 IOPAMIDOL (ISOVUE-300) INJECTION 61% COMPARISON:  None. FINDINGS: Urinary Tract:  Bladder is within normal limits. Bowel:  Visualized bowel is unremarkable. Vascular/Lymphatic: Iliac vessels are within normal limits. No suspicious pelvic lymphadenopathy. Reproductive:  Uterus is within normal limits. Bilateral ovaries are within normal limits. Other:  No pelvic ascites. Subcutaneous gas in the left ischiorectal fossa (series 2/ image 43), left medial gluteal region (series 2/ image 51), and along the medial aspect of the gluteus major muscle (series 2/ image 45). Additional soft tissue gas extends into the left obturator fossa (series 2/ image 35). This is likely iatrogenic, related to recent procedure. No residual fluid collection/abscess is evident within the soft tissues on the left. 4.1 x 2.0 x 2.2 cm subcutaneous fluid collection along the right anterior gluteal fold (series 2/ image 52; series 4/ image 94). Musculoskeletal: Visualized osseous structures are within normal limits. IMPRESSION: Soft tissue gas extending from the left medial gluteal region into the left ischiorectal fossa and left  obturator region, likely related to recent procedure. No residual fluid collection/abscess on the left. 4.1 x 2.0 x 2.2 cm subcutaneous fluid collection along the right anterior gluteal fold, abscess not excluded. Electronically Signed   By: SJulian HyM.D.   On: 02/24/2016 11:55     LOS: 1 day   GOren Binet MD  [facility] Pager:[phone] If 7PM-7AM, please contact night-coverage [email or web address] Password [email or web address]12/28/2017, 3:14  PM

## 2016-02-28 NOTE — Progress Notes (Signed)
Pt. Did 2nd sitz bath.

## 2016-02-28 NOTE — Progress Notes (Signed)
Upon examination of patient's penrose drain this AM, the drain was hanging out and the pt. Had stuffed toilet paper around the wound area. PA (Will [place]) was notified.

## 2016-02-28 NOTE — Progress Notes (Signed)
Pt refused sitz bath again this morning. Explained benefits of the sitz bath and verbalized understanding and still refuses.. Pt stated " I just want to sleep".

## 2016-02-28 NOTE — Progress Notes (Signed)
Initial Nutrition Assessment  DOCUMENTATION CODES:   Not applicable  INTERVENTION:   -MVI daily -Glucerna Shake po q HS, each supplement provides 220 kcal and 10 grams of protein  NUTRITION DIAGNOSIS:   Increased nutrient needs related to wound healing as evidenced by estimated needs.  GOAL:   Patient will meet greater than or equal to 90% of their needs  MONITOR:   PO intake, Supplement acceptance, Labs, Weight trends, Skin, I & O's  REASON FOR ASSESSMENT:   Consult Assessment of nutrition requirement/status  ASSESSMENT:   Pt is a 45 yo F with a perirectal abscess that she has been dealing with for 5-6 days.  She has been seen at urgent care and in the ED.  She has been on antibiotics.  The abscess was drained and packed 4 days ago in the ED.  Packing has fallen out.  Since that time, she has had copious drainage and bleeding and continues to have severe pain.  She denies fever/chills.  She has been having chills  Pt admitted with perirectal abscess. S/p I&D on 02/24/16. Per surgery notes, may need to undergo EUA tomorrow if no improvement.  Spoke with pt at bedside, who reports she usually has a great appetite. She generally consumes 3 meals per day (Breakfast: egg, cheese, sausage sandwich with coffee, Lunch and Dinner: meat, starch, and vegetable). Per pt, "I've always eaten a lot- I wonder why I've always been small"; she shares she has always been petite in stature. Pt reports UBW is around 130# and denies any recent wt loss.   Meal completion 50-100%. Noted pt consumed only two cups of orange juice on breakfast tray; pt reported that one of her medications caused her nausea this morning and was unable to complete her breakfast.   Pt describes herself as a healthy active individual. 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   Discussed importance of good meal intake to promote healing, as well as continuing to follow  a general, healthful diet to promote wellness. Noted Hgb A1c of 5.7; suspect reading is falsely elevated related to anemia.   Nutrition-Focused physical exam completed. Findings are no fat depletion, no muscle depletion, and no edema.   Labs reviewed.   Diet Order:  Diet Carb Modified Fluid consistency: Thin; Room service appropriate? Yes Diet NPO time specified  Skin:  Wound (see comment) (perirectal abcess)  Last BM:  02/27/16  Height:   Ht Readings from Last 1 Encounters:  02/24/16 5' 7.5" (1.715 m)    Weight:   Wt Readings from Last 1 Encounters:  02/24/16 130 lb (59 kg)    Ideal Body Weight:  62.5 kg  BMI:  Body mass index is 20.06 kg/m.  Estimated Nutritional Needs:   Kcal:  1600-1800  Protein:  80-95 grams  Fluid:  >1.6 L  EDUCATION NEEDS:   Education needs addressed  Porchia Sinkler A. Jimmye Norman, RD, LDN, CDE Pager: [phone] After hours Pager: [phone]

## 2016-02-28 NOTE — Progress Notes (Signed)
4 Days Post-Op  Subjective: Pt still having allot of pain and drainage.  She did have a BM and the penrose drain is hanging out.  She is really to tender to examine very well.  Her H/H is also down this AM to 6.8/20.1.  She is having issues with voiding, I will check to see if she has a UTI.  She wouldn't do Sitz baths yesterday, and had toilet paper stuck up in the open site this AM.  Objective: Vital signs in last 24 hours: Temp:  [98.4 F (36.9 C)-98.6 F (37 C)] 98.4 F (36.9 C) (12/28 0630) Pulse Rate:  [87-95] 87 (12/28 0630) Resp:  [16-18] 16 (12/28 0630) BP: (114-122)/(67-72) 114/67 (12/28 0630) SpO2:  [100 %] 100 % (12/28 0630) Last BM Date: 02/27/16 1680 PO urine 5100 BM x 1 Afebrile, VSS BMP OK H/H down to 6.8/20.1 Platelets 295 Prealbumin 12 Intake/Output from previous day: 12/27 0701 - 12/28 0700 In: 1680 [P.O.:1680] Out: 5100 [Urine:5100] Intake/Output this shift: Total I/O In: 120 [P.O.:120] Out: 0   General appearance: alert, cooperative and very anxious and uncomfortable, she could not tolerate any manipulation of her rectum this a.m. Resp: clear to auscultation bilaterally Skin: Open site is draining. There is a good 10 inches of Penrose hanging from the open wound. She is extremely tender and I cannot really examine the wound very well.  Lab Results:   Recent Labs  02/27/16 1040 02/28/16 0323  WBC 7.1 7.3  HGB 8.0* 6.8*  HCT 23.7* 20.1*  PLT 295 295    BMET  Recent Labs  02/27/16 1040 02/28/16 0323  NA 137 135  K 4.3 4.1  CL 104 102  CO2 27 27  GLUCOSE 110* 89  BUN <5* 5*  CREATININE 0.80 0.74  CALCIUM 8.2* 8.1*   PT/INR No results for input(s): LABPROT, INR in the last 72 hours.   Recent Labs Lab 02/24/16 0844  AST 17  ALT 12*  ALKPHOS 66  BILITOT 0.6  PROT 6.1*  ALBUMIN 1.8*     Lipase     Component Value Date/Time   LIPASE 14 10/23/2015 1627     Studies/Results: No results found.  Medications: . sodium  chloride   Intravenous Once  . docusate sodium  100 mg Oral BID  . enoxaparin (LOVENOX) injection  40 mg Subcutaneous Q24H  . ferrous gluconate  324 mg Oral BID WC  . multivitamins with iron  1 tablet Oral Daily  . pantoprazole  40 mg Oral Daily  . piperacillin-tazobactam (ZOSYN)  IV  3.375 g Intravenous Q8H  . senna  2 tablet Oral Daily    Assessment/Plan Left perirectalabscess  S/p Incision and Drainage complex left ischiorectal abscess, 02/24/16, Dr.Faera Byerl Hx of tobacco use Family Hx of diabetes Anemia -I have ask Medicine to see Malnutrition - prealbumin 12 Prediabetes with A1C 5.7 - Medicine to see FEN: IV fluids/ Regular diet ID: Day 5 Zosyn and vancomycin stopped 02/26/16   DVT: Lovenox she has had dose for today will hold in AM, rescheduled for 1800 hrs tomorrow.   Plan:  Transfuse 1 unit of PRBC today, check urine, try Sitz baths again.  I think we may need to take her back to OR tomorrow for EUA if she is not improved. I will continue Zosyn for now.  Ask Dietician to see and help with nutrition.  I have made her NPO after MN in case we have to take her to OR tomorrow. UA is unremarkable  LOS: 1 day    Tysin Salada 02/28/2016 [phone]

## 2016-02-28 NOTE — Progress Notes (Signed)
Pt. Doing sitz bath.

## 2016-02-28 NOTE — Progress Notes (Signed)
CRITICAL VALUE ALERT  Critical value received:  Hgb=6.8  Date of notification:  02/28/2016  Time of notification:  9379  Critical value read back:Yes.      Nurse who received alert:  Isidoro Donning, RN  MD notified (1st page):  Dr. Hulen Skains  Time of first page:  [phone]  MD notified (2nd page): Dr. Hulen Skains  Time of second [id number]  Responding MD:  Dr. Hulen Skains  Time MD responded:  [phone]  No new orders at this time.

## 2016-02-29 ENCOUNTER — Encounter: Payer: Self-pay | Admitting: General Surgery

## 2016-02-29 ENCOUNTER — Encounter (HOSPITAL_COMMUNITY): Payer: Self-pay | Admitting: General Surgery

## 2016-02-29 DIAGNOSIS — K611 Rectal abscess: Secondary | ICD-10-CM

## 2016-02-29 DIAGNOSIS — E46 Unspecified protein-calorie malnutrition: Secondary | ICD-10-CM

## 2016-02-29 HISTORY — DX: Unspecified protein-calorie malnutrition: E46

## 2016-02-29 LAB — URINE CULTURE: Culture: NO GROWTH

## 2016-02-29 LAB — BASIC METABOLIC PANEL
ANION GAP: 5 (ref 5–15)
BUN: <5 mg/dL — ABNORMAL LOW (ref 6–20)
CALCIUM: 8.6 mg/dL — AB (ref 8.9–10.3)
CO2: 30 mmol/L (ref 22–32)
Chloride: 99 mmol/L — ABNORMAL LOW (ref 101–111)
Creatinine, Ser: 0.83 mg/dL (ref 0.44–1.00)
GFR calc non Af Amer: >60 mL/min (ref >60)
Glucose, Bld: 84 mg/dL (ref 65–99)
POTASSIUM: 4.4 mmol/L (ref 3.5–5.1)
Sodium: 134 mmol/L — ABNORMAL LOW (ref 135–145)

## 2016-02-29 LAB — CBC
HEMATOCRIT: 26.4 % — AB (ref 36.0–46.0)
HEMOGLOBIN: 8.9 g/dL — AB (ref 12.0–15.0)
MCH: 27 pg (ref 26.0–34.0)
MCHC: 33.7 g/dL (ref 30.0–36.0)
MCV: 80 fL (ref 78.0–100.0)
Platelets: 308 10*3/uL (ref 150–400)
RBC: 3.3 MIL/uL — AB (ref 3.87–5.11)
RDW: 13.6 % (ref 11.5–15.5)
WBC: 7 10*3/uL (ref 4.0–10.5)

## 2016-02-29 LAB — GLUCOSE, CAPILLARY
GLUCOSE-CAPILLARY: 83 mg/dL (ref 65–99)
GLUCOSE-CAPILLARY: 104 mg/dL — AB (ref 65–99)
Glucose-Capillary: 123 mg/dL — ABNORMAL HIGH (ref 65–99)

## 2016-02-29 LAB — CULTURE, BLOOD (ROUTINE X 2)
CULTURE: NO GROWTH
Culture: NO GROWTH

## 2016-02-29 LAB — TYPE AND SCREEN
BLOOD PRODUCT EXPIRATION DATE: 201801062359
ISSUE DATE / TIME: 201712281445
UNIT TYPE AND RH: 5100

## 2016-02-29 MED ORDER — IBUPROFEN 200 MG PO TABS: ORAL_TABLET | ORAL | Status: DC

## 2016-02-29 MED ORDER — ACETAMINOPHEN 325 MG PO TABS: 650.0000 mg | ORAL_TABLET | Freq: Four times a day (QID) | ORAL | Status: DC | PRN

## 2016-02-29 MED ORDER — FERROUS GLUCONATE 324 (38 FE) MG PO TABS: 324.0000 mg | ORAL_TABLET | Freq: Two times a day (BID) | ORAL | 3 refills | Status: DC

## 2016-02-29 MED ORDER — SACCHAROMYCES BOULARDII 250 MG PO CAPS: ORAL_CAPSULE | ORAL | Status: DC

## 2016-02-29 MED ORDER — IBUPROFEN 600 MG PO TABS: 600.0000 mg | ORAL_TABLET | Freq: Four times a day (QID) | ORAL | Status: DC | PRN

## 2016-02-29 MED ORDER — AMOXICILLIN-POT CLAVULANATE 875-125 MG PO TABS
1.0000 | ORAL_TABLET | Freq: Two times a day (BID) | ORAL | Status: DC
  Administered 2016-02-29: 1 via ORAL
  Filled 2016-02-29: qty 1

## 2016-02-29 MED ORDER — HYDROCODONE-ACETAMINOPHEN 5-325 MG PO TABS: 1.0000 | ORAL_TABLET | ORAL | 0 refills | Status: DC | PRN

## 2016-02-29 MED ORDER — AMOXICILLIN-POT CLAVULANATE 875-125 MG PO TABS: 1.0000 | ORAL_TABLET | Freq: Two times a day (BID) | ORAL | 0 refills | Status: DC

## 2016-02-29 MED ORDER — SACCHAROMYCES BOULARDII 250 MG PO CAPS: 250.0000 mg | ORAL_CAPSULE | Freq: Two times a day (BID) | ORAL | Status: DC

## 2016-02-29 MED ORDER — TAB-A-VITE/IRON PO TABS: ORAL_TABLET | ORAL | 0 refills | Status: DC

## 2016-02-29 NOTE — Discharge Summary (Signed)
Physician Discharge Summary  Patient ID: Nicole Browning MRN: [id number] DOB/AGE: August 06, 1970 45 y.o.  Admit date: 02/24/2016 Discharge date: 02/29/2016  Admission Diagnoses:  Perirectal abscess Tobacco use Anemia  Discharge Diagnoses:   Left perirectalabscess  Hx of tobacco use Family Hx of diabetes Anemia -I have ask Medicine to see/Transfused 1 unit PRBC 02/28/16 Malnutrition - prealbumin 12 Prediabetes with A1C 5.7 - Medicine to see  Principal Problem:   Perirectal abscess Active Problems:   Anemia of chronic disease   Tobacco use   Elevated hemoglobin A1c   Prediabetes   Protein-calorie malnutrition (HCC)   GERD (gastroesophageal reflux disease)   PROCEDURES: S/p Incision and Drainage complex left ischiorectal abscess, 02/24/16, [place] Hospital Course:  Pt is a 45 yo F with a perirectal abscess that she has been dealing with for 5-6 days.  She has been seen at urgent care and in the ED.  She has been on antibiotics.  The abscess was drained and packed 4 days ago in the ED.  Packing has fallen out.  Since that time, she has had copious drainage and bleeding and continues to have severe pain.  She denies fever/chills.  She has been having chills.  She was seen and admitted to the hospital and taken to the OR later that day.  She underwent the surgery described above and tolerated it well. 2 penrose drains were p left in place.  Post op she was better but very sore and tender.  Labs post op on 02/24/16 showed she had a significant anemia.  WBC was much improved but H/H was down 2 gerams.  Repeat the following day showed ongoing anemia.  Anemia panel, hemoglobin A1C, and HIV were ultimately obtained and those results are below.  We ask Medicine to see her and it was their opinion iron supplement for her anemia for now, and follow up for her anemia with Medicine and Hematology clinic were needed post discharge.  She had a second drop in her H/H and we transfused her on  02/28/16.  H/H 6.8/20 at that time and she improved with transfusion to 8.9/26.4.  We discussed her diet and medical follow up.   Her perirectal wound remained tender and was draining purulent fluid that was also bloody. We stopped her Lovenox and transfused with 1 Unit of PRBC, 02/28/16.  She felt better the follow day, she still had some blood drainage from the site.  Medicine was comfortable with her going home.  No Rx for prediabetes.  She is on Fe supplement and will need evaluation at the Hematology clinic at some point.  She first needs to follow up with a PCP, who can then refer her.   She is to follow up with PCP, she is to continue Sitz bath and showers. Penrose drains remain in place.  Follow up with Dr. Barry Dienes.    CBC Latest Ref Rng & Units 02/29/2016 02/28/2016 02/27/2016  WBC 4.0 - 10.5 K/uL 7.0 7.3 7.1  Hemoglobin 12.0 - 15.0 g/dL 8.9(L) 6.8(LL) 8.0(L)  Hematocrit 36.0 - 46.0 % 26.4(L) 20.1(L) 23.7(L)  Platelets 150 - 400 K/uL 308 295 295   CMP Latest Ref Rng & Units 02/29/2016 02/28/2016 02/27/2016  Glucose 65 - 99 mg/dL 84 89 110(H)  BUN 6 - 20 mg/dL <5(L) 5(L) <5(L)  Creatinine 0.44 - 1.00 mg/dL 0.83 0.74 0.80  Sodium 135 - 145 mmol/L 134(L) 135 137  Potassium 3.5 - 5.1 mmol/L 4.4 4.1 4.3  Chloride 101 - 111 mmol/L  99(L) 102 104  CO2 22 - 32 mmol/L 30 27 27   Calcium 8.9 - 10.3 mg/dL 8.6(L) 8.1(L) 8.2(L)  Total Protein 6.5 - 8.1 g/dL - - -  Total Bilirubin 0.3 - 1.2 mg/dL - - -  Alkaline Phos 38 - 126 U/L - - -  AST 15 - 41 U/L - - -  ALT 14 - 54 U/L - - -   Iron         64     Iron     UIBC         98     UIBC    TIBC         162     TIBC    Saturation Ratios         39     Saturation Ratios    Ferritin         180     Ferritin    Folate         9.4         Vitamin B12         792     RBC.         2.66     RBC.     Retic Ct Pct         0.5     Retic Ct Pct    Retic Count, Manual         13.3         Hemoglobin A1C 5.7 Status:  Final result   Ref Range &  Units 02/26/16 1334  Hgb A1c MFr Bld 4.8 - 5.6 % 5.7    Comments: (NOTE)      Pre-diabetes: 5.7 - 6.4      Diabetes: >6.4      Glycemic control for adults with diabetes: <7.0   Mean Plasma Glucose mg/dL 117   Comments: (NOTE)               <7.0 " style="padding: 0px 1px 0px 0px; text-align: right;">5.7<7.0 " style="border: currentColor; border-image: none; width: 5px; height: 10px;" src="[email or web address]">    HIV HIV Screen 4th Generation wRfx Non Reactive Non Reactive              Non Re...       Disposition: 01-Home or Self Care   Allergies as of 02/29/2016      Reactions   Percocet [oxycodone-acetaminophen] Nausea Only   Valium [diazepam] Nausea Only      Medication List    STOP taking these medications   BOIL-EASE EX   dicyclomine 20 MG tablet Commonly known as:  BENTYL   diphenhydrAMINE 25 MG tablet Commonly known as:  BENADRYL   ondansetron 4 MG disintegrating tablet Commonly known as:  ZOFRAN ODT   sulfamethoxazole-trimethoprim 800-160 MG tablet Commonly known as:  BACTRIM DS,SEPTRA DS     TAKE these medications   acetaminophen 325 MG tablet Commonly known as:  TYLENOL Take 2 tablets (650 mg total) by mouth every 6 (six) hours as needed for mild pain (or temp > 100).   amoxicillin-clavulanate 875-125 MG tablet Commonly known as:  AUGMENTIN Take 1 tablet by mouth every 12 (twelve) hours.   esomeprazole 40 MG capsule Commonly known as:  NEXIUM Take 40 mg by mouth daily as needed (reflux).   ferrous gluconate 324 MG tablet Commonly known as:  FERGON Take 1 tablet (324 mg total) by mouth 2 (two) times daily with a meal.   HYDROcodone-acetaminophen 5-325  MG tablet Commonly known as:  NORCO/VICODIN Take 1-2 tablets by mouth every 4 (four) hours as needed for moderate pain. What changed:  how much to take  reasons to take this     ibuprofen 200 MG tablet Commonly known as:  ADVIL,MOTRIN You can take 2-3 tablets every 6 hours safely.  Do not take more.  Use the prescribed pain medicine for pain not relieved by Ibuprofen or plain Tylenol.   multivitamins with iron Tabs tablet You can get the Women's One per day type vitamin and take one daily.  Take after you eat, it is easier on your stomach.  Buy this over the counter at any drug store.   saccharomyces boulardii 250 MG capsule Commonly known as:  FLORASTOR You can buy this over the counter at any drug store.  This help prevent colon infections from all the antibiotics you are on.  I would get one package and follow directions.  Once your off antibiotics you will not need this.      Follow-up Information    [facility]. Schedule an appointment as soon as possible for a visit.   Contact information: [address] [place] [address] [phone]       Stark Klein, MD Follow up.   Specialty:  General Surgery Why:  Our office will call and get you into clinic in about 7-10 days.  if you do not hear by 03/05/15 call and ask for appointment.  Be sure to tell them you have the drain in place. Contact information: [address] [place] [address] [phone]           Signed: Earnstine Regal 02/29/2016, 2:36 PM

## 2016-02-29 NOTE — Progress Notes (Signed)
5 Days Post-Op  Subjective: She is doing better and feels better.  Still very tender with allot of drainage that is purulent and bloody. Constipation resolved and she is having some loose stool now.  Lives with her mother who can help with her care at home.    Objective: Vital signs in last 24 hours: Temp:  [97.5 F (36.4 C)-98.2 F (36.8 C)] 98 F (36.7 C) (12/29 0525) Pulse Rate:  [70-92] 73 (12/29 0525) Resp:  [16-17] 17 (12/29 0525) BP: (108-120)/(60-77) 108/67 (12/29 0525) SpO2:  [100 %] 100 % (12/29 0525) Last BM Date: 02/27/16 600 PO Urine 1052 Afebrile, VSS H/H is up to 8.9/26.4 WBC is normal, BMP OK Intake/Output from previous day: 12/28 0701 - 12/29 0700 In: 284 [P.O.:600; Blood:335] Out: 1052 [Urine:1052] Intake/Output this shift: No intake/output data recorded.  General appearance: alert, cooperative and no distress Resp: clear to auscultation bilaterally Skin:  Open wound OK, still draining allot, one of the 2 drains is hanging out, I cut it in half and place it back in the wound.  Drainage is purulent and bloody still.  She did the Sitz x 2 yesterday.  Medicine would also like her to follow-up with hematology clinic when possible.  Lab Results:   Recent Labs  02/28/16 0323 02/29/16 0426  WBC 7.3 7.0  HGB 6.8* 8.9*  HCT 20.1* 26.4*  PLT 295 308    BMET  Recent Labs  02/28/16 0323 02/29/16 0426  NA 135 134*  K 4.1 4.4  CL 102 99*  CO2 27 30  GLUCOSE 89 84  BUN 5* <5*  CREATININE 0.74 0.83  CALCIUM 8.1* 8.6*   PT/INR No results for input(s): LABPROT, INR in the last 72 hours.   Recent Labs Lab 02/24/16 0844  AST 17  ALT 12*  ALKPHOS 66  BILITOT 0.6  PROT 6.1*  ALBUMIN 1.8*     Lipase     Component Value Date/Time   LIPASE 14 10/23/2015 1627     Studies/Results: No results found.  Medications: . docusate sodium  100 mg Oral BID  . enoxaparin (LOVENOX) injection  40 mg Subcutaneous Q24H  . feeding supplement (GLUCERNA SHAKE)   237 mL Oral QHS  . ferrous gluconate  324 mg Oral BID WC  . multivitamins with iron  1 tablet Oral Daily  . pantoprazole  40 mg Oral Daily  . piperacillin-tazobactam (ZOSYN)  IV  3.375 g Intravenous Q8H  . senna  2 tablet Oral Daily    FEW DIPHTHEROIDS(CORYNEBACTERIUM SPECIES)     No growth from blood cultures UA  - OK culture pending  Assessment/Plan Left perirectalabscess  S/p Incision and Drainage complex left ischiorectal abscess, 02/24/16, Dr.Faera Byerl Hx of tobacco use Family Hx of diabetes Anemia -I have ask Medicine to see/Transfused 1 unit PRBC 02/28/16 Malnutrition - prealbumin 12 Prediabetes with A1C 5.7 - Medicine to see FEN: IV fluids/ Regular diet ID: Day [id number] completed =>> convert to Augmentin;  vancomycin stopped 02/26/16  DVT: Lovenox   Plan:  Add probiotics, switch to Augmentin.  Shower and Sitz bath 3-4 times per day.  Dry dressing to open site.  I have ask Case management to see and help her at home.  [facility] and she knows to call and follow up.      LOS: 2 days    Nicole Browning 02/29/2016 [phone]

## 2016-02-29 NOTE — Progress Notes (Signed)
RN asked pt she wanted the last of 3 sitz baths for the night- pt said 2 was enough at the time.

## 2016-02-29 NOTE — Discharge Instructions (Signed)
Perirectal Abscess Introduction An abscess is an infected area that contains a collection of pus. A perirectal abscess is an abscess that is near the opening of the anus or around the rectum. A perirectal abscess can cause a lot of pain, especially during bowel movements. What are the causes? This condition is almost always caused by an infection that starts in an anal gland. What increases the risk? This condition is more likely to develop in:  People with diabetes or inflammatory bowel disease.  People whose body defense system (immune system) is weak.  People who have anal sex.  People who have a sexually transmitted disease (STD).  People who have certain kinds of cancers, such as rectal carcinoma, leukemia, or lymphoma. What are the signs or symptoms? The main symptom of this condition is pain. The pain may be a throbbing pain that gets worse during bowel movements. Other symptoms include:  Fever.  Swelling.  Redness.  Bleeding.  Constipation. How is this diagnosed? The condition is diagnosed with a physical exam. If the abscess is not visible, a health care provider may need to place a finger inside the rectum to find the abscess. Sometimes, imaging tests are done to determine the size and location of the abscess. These tests may include:  An ultrasound.  An MRI.  A CT scan. How is this treated? This condition is usually treated with incision and drainage surgery. Incision and drainage surgery involves making an incision over the abscess to drain the pus. Treatment may also involve antibiotic medicine, pain medicine, stool softeners, or laxatives. Follow these instructions at home:  Take medicines only as directed by your health care provider.  If you were prescribed an antibiotic, finish all of it even if you start to feel better.  To relieve pain, try sitting:  In a warm, shallow bath (sitz bath).  On a heating pad with the setting on low.  On an inflatable  donut-shaped cushion.  Follow any diet instructions as directed by your health care provider.  Keep all follow-up visits as directed by your health care provider. This is important. Contact a health care provider if:  Your abscess is bleeding.  You have pain, swelling, or redness that is getting worse.  You are constipated.  You feel ill.  You have muscle aches or chills.  You have a fever.  Your symptoms return after the abscess has healed. This information is not intended to replace advice given to you by your health care provider. Make sure you discuss any questions you have with your health care provider. Document Released: 02/15/2000 Document Revised: 07/26/2015 Document Reviewed: 12/28/2013  2017 Elsevier Shower and use Sitz bath 4 times a day.  Keep a clean dry dressing in place.  Replace it as needed.  Anemia, Nonspecific - yours is most likely related to your iron stores. Anemia is a condition in which the concentration of red blood cells or hemoglobin in the blood is below normal. Hemoglobin is a substance in red blood cells that carries oxygen to the tissues of the body. Anemia results in not enough oxygen reaching these tissues. What are the causes? Common causes of anemia include:  Excessive bleeding. Bleeding may be internal or external. This includes excessive bleeding from periods (in women) or from the intestine.  Poor nutrition.  Chronic kidney, thyroid, and liver disease.  Bone marrow disorders that decrease red blood cell production.  Cancer and treatments for cancer.  HIV, AIDS, and their treatments.  Spleen problems that  increase red blood cell destruction.  Blood disorders.  Excess destruction of red blood cells due to infection, medicines, and autoimmune disorders. What are the signs or symptoms?  Minor weakness.  Dizziness.  Headache.  Palpitations.  Shortness of breath, especially with exercise.  Paleness.  Cold  sensitivity.  Indigestion.  Nausea.  Difficulty sleeping.  Difficulty concentrating. Symptoms may occur suddenly or they may develop slowly. How is this diagnosed? Additional blood tests are often needed. These help your health care provider determine the best treatment. Your health care provider will check your stool for blood and look for other causes of blood loss. How is this treated? Treatment varies depending on the cause of the anemia. Treatment can include:  Supplements of iron, vitamin A12, or folic acid.  Hormone medicines.  A blood transfusion. This may be needed if blood loss is severe.  Hospitalization. This may be needed if there is significant continual blood loss.  Dietary changes.  Spleen removal. Follow these instructions at home: Keep all follow-up appointments. It often takes many weeks to correct anemia, and having your health care provider check on your condition and your response to treatment is very important. Get help right away if:  You develop extreme weakness, shortness of breath, or chest pain.  You become dizzy or have trouble concentrating.  You develop heavy vaginal bleeding.  You develop a rash.  You have bloody or black, tarry stools.  You faint.  You vomit up blood.  You vomit repeatedly.  You have abdominal pain.  You have a fever or persistent symptoms for more than 2-3 days.  You have a fever and your symptoms suddenly get worse.  You are dehydrated. This information is not intended to replace advice given to you by your health care provider. Make sure you discuss any questions you have with your health care provider. Document Released: 03/27/2004 Document Revised: 08/01/2015 Document Reviewed: 08/13/2012 Elsevier Interactive Patient Education  2017 [place].   Preventing Type 2 Diabetes Mellitus Type 2 diabetes (type 2 diabetes mellitus) is a long-term (chronic) disease that affects blood sugar (glucose) levels.  Normally, a hormone called insulin allows glucose to enter cells in the body. The cells use glucose for energy. In type 2 diabetes, one or both of these problems may be present:  The body does not make enough insulin.  The body does not respond properly to insulin that it makes (insulin resistance). Insulin resistance or lack of insulin causes excess glucose to build up in the blood instead of going into cells. As a result, high blood glucose (hyperglycemia) develops, which can cause many complications. Being overweight or obese and having an inactive (sedentary) lifestyle can increase your risk for diabetes. Type 2 diabetes can be delayed or prevented by making certain nutrition and lifestyle changes. What nutrition changes can be made?  Eat healthy meals and snacks regularly. Keep a healthy snack with you for when you get hungry between meals, such as fruit or a handful of nuts.  Eat lean meats and proteins that are low in saturated fats, such as chicken, fish, egg whites, and beans. Avoid processed meats.  Eat plenty of fruits and vegetables and plenty of grains that have not been processed (whole grains). It is recommended that you eat:  1?2 cups of fruit every day.  2?3 cups of vegetables every day.  6?8 oz of whole grains every day, such as oats, whole wheat, bulgur, brown rice, quinoa, and millet.  Eat low-fat dairy products, such  as milk, yogurt, and cheese.  Eat foods that contain healthy fats, such as nuts, avocado, olive oil, and canola oil.  Drink water throughout the day. Avoid drinks that contain added sugar, such as soda or sweet tea.  Follow instructions from your health care provider about specific eating or drinking restrictions.  Control how much food you eat at a time (portion size).  Check food labels to find out the serving sizes of foods.  Use a kitchen scale to weigh amounts of foods.  Saute or steam food instead of frying it. Cook with water or broth  instead of oils or butter.  Limit your intake of:  Salt (sodium). Have no more than 1 tsp (2,400 mg) of sodium a day. If you have heart disease or high blood pressure, have less than ? tsp (1,500 mg) of sodium a day.  Saturated fat. This is fat that is solid at room temperature, such as butter or fat on meat. What lifestyle changes can be made?  Activity  Do moderate-intensity physical activity for at least 30 minutes on at least 5 days of the week, or as much as told by your health care provider.  Ask your health care provider what activities are safe for you. A mix of physical activities may be best, such as walking, swimming, cycling, and strength training.  Try to add physical activity into your day. For example:  Park in spots that are farther away than usual, so that you walk more. For example, park in a far corner of the parking lot when you go to the office or the grocery store.  Take a walk during your lunch break.  Use stairs instead of elevators or escalators. Weight Loss  Lose weight as directed. Your health care provider can determine how much weight loss is best for you and can help you lose weight safely.  If you are overweight or obese, you may be instructed to lose at least 5?7 % of your body weight. Alcohol and Tobacco   Limit alcohol intake to no more than 1 drink a day for nonpregnant women and 2 drinks a day for men. One drink equals 12 oz of beer, 5 oz of wine, or 1 oz of hard liquor.  Do not use any tobacco products, such as cigarettes, chewing tobacco, and e-cigarettes. If you need help quitting, ask your health care provider. Work With [place] Provider  Have your blood glucose tested regularly, as told by your health care provider.  Discuss your risk factors and how you can reduce your risk for diabetes.  Get screening tests as told by your health care provider. You may have screening tests regularly, especially if you have certain risk  factors for type 2 diabetes.  Make an appointment with a diet and nutrition specialist (registered dietitian). A registered dietitian can help you make a healthy eating plan and can help you understand portion sizes and food labels. Why are these changes important?  It is possible to prevent or delay type 2 diabetes and related health problems by making lifestyle and nutrition changes.  It can be difficult to recognize signs of type 2 diabetes. The best way to avoid possible damage to your body is to take actions to prevent the disease before you develop symptoms. What can happen if changes are not made?  Your blood glucose levels may keep increasing. Having high blood glucose for a long time is dangerous. Too much glucose in your blood can damage your  blood vessels, heart, kidneys, nerves, and eyes.  You may develop prediabetes or type 2 diabetes. Type 2 diabetes can lead to many chronic health problems and complications, such as:  Heart disease.  Stroke.  Blindness.  Kidney disease.  Depression.  Poor circulation in the feet and legs, which could lead to surgical removal (amputation) in severe cases. Where to find support:  Ask your health care provider to recommend a registered dietitian, diabetes educator, or weight loss program.  Look for local or online weight loss groups.  Join a gym, fitness club, or outdoor activity group, such as a walking club. Where to find more information: To learn more about diabetes and diabetes prevention, visit:  American Diabetes Association (ADA): www.diabetes.[employer] of Diabetes and Digestive and Kidney Diseases: FindSpin.nl To learn more about healthy eating, visit:  The U.S. Department of Agriculture Scientist, research (physical sciences)), Choose My Plate: http://wiley-williams.com/  Office of Disease Prevention and Health Promotion (ODPHP), Dietary Guidelines: SurferLive.at Summary  You  can reduce your risk for type 2 diabetes by increasing your physical activity, eating healthy foods, and losing weight as directed.  Talk with your health care provider about your risk for type 2 diabetes. Ask about any blood tests or screening tests that you need to have. This information is not intended to replace advice given to you by your health care provider. Make sure you discuss any questions you have with your health care provider. Document Released: 06/11/2015 Document Revised: 07/26/2015 Document Reviewed: 04/10/2015 Elsevier Interactive Patient Education  2017 [employer].

## 2016-02-29 NOTE — Progress Notes (Signed)
CM talked to patient about DCP; she does not want any HHC or HHRN to help her after discharge; patient stated that she plans to stay with her mother for a few days. No other needs voiced. Mindi Slicker [facility] [phone]

## 2016-03-02 LAB — AEROBIC/ANAEROBIC CULTURE W GRAM STAIN (SURGICAL/DEEP WOUND)

## 2016-03-02 LAB — AEROBIC/ANAEROBIC CULTURE (SURGICAL/DEEP WOUND)

## 2016-11-03 ENCOUNTER — Encounter (HOSPITAL_COMMUNITY): Payer: Self-pay | Admitting: Emergency Medicine

## 2016-11-03 ENCOUNTER — Ambulatory Visit (HOSPITAL_COMMUNITY)
Admission: EM | Admit: 2016-11-03 | Discharge: 2016-11-03 | Disposition: A | Discharge status: Home / Self Care | Payer: Self-pay | Attending: Physician Assistant | Admitting: Physician Assistant

## 2016-11-03 DIAGNOSIS — L0291 Cutaneous abscess, unspecified: Secondary | ICD-10-CM

## 2016-11-03 MED ORDER — NAPROXEN 500 MG PO TABS: 500.0000 mg | ORAL_TABLET | Freq: Two times a day (BID) | ORAL | 0 refills | Status: AC

## 2016-11-03 MED ORDER — CEPHALEXIN 500 MG PO CAPS: 500.0000 mg | ORAL_CAPSULE | Freq: Four times a day (QID) | ORAL | 0 refills | Status: DC

## 2016-11-03 MED ORDER — SULFAMETHOXAZOLE-TRIMETHOPRIM 800-160 MG PO TABS: 1.0000 | ORAL_TABLET | Freq: Two times a day (BID) | ORAL | 0 refills | Status: AC

## 2016-11-03 NOTE — ED Provider Notes (Signed)
[facility]    CSN: [id number] Arrival date & time: 11/03/16  1710     History   Chief Complaint Chief Complaint  Patient presents with  . Recurrent Skin Infections    HPI Nicole Browning is a 46 y.o. female.   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 She has been applying Epsom salt and heat compresses without relief. States pain is worse. Has not taken anything for pain. Denies fever, chills, night sweats. Denies spreading erythema, increased warmth, discharge. Denies history of diabetes.       Past Medical History:  Diagnosis Date  . Anemia   . GERD (gastroesophageal reflux disease)   . Perirectal abscess   . Protein-calorie malnutrition ([place]) 02/29/2016  . Tobacco use     Patient Active Problem List   Diagnosis Date Noted  . Protein-calorie malnutrition ([place]) 02/29/2016  . Elevated hemoglobin A1c 02/27/2016  . GERD (gastroesophageal reflux disease)   . Tobacco use   . Anemia of chronic disease   . Prediabetes   . Perirectal abscess 02/24/2016    Past Surgical History:  Procedure Laterality Date  . IRRIGATION AND DEBRIDEMENT ABSCESS N/A 02/24/2016   Procedure: IRRIGATION AND DEBRIDEMENT ABSCESS;  Surgeon: Stark Klein, MD;  Location: [facility];  Service: General;  Laterality: N/A;    OB History    No data available       Home Medications    Prior to Admission medications   Medication Sig Start Date End Date Taking? Authorizing Provider  acetaminophen (TYLENOL) 325 MG tablet Take 2 tablets (650 mg total) by mouth every 6 (six) hours as needed for mild pain (or temp > 100). 02/29/16   Earnstine Regal, PA-C  amoxicillin-clavulanate (AUGMENTIN) 875-125 MG tablet Take 1 tablet by mouth every 12 (twelve) hours. 02/29/16   Earnstine Regal, PA-C  cephALEXin (KEFLEX) 500 MG capsule Take 1 capsule (500 mg total) by mouth 4 (four) times daily. 11/03/16   Tasia Catchings, Devita Nies V,  PA-C  esomeprazole (NEXIUM) 40 MG capsule Take 40 mg by mouth daily as needed (reflux).     [provider]  ferrous gluconate (FERGON) 324 MG tablet Take 1 tablet (324 mg total) by mouth 2 (two) times daily with a meal. 02/29/16   Earnstine Regal, PA-C  HYDROcodone-acetaminophen (NORCO/VICODIN) 5-325 MG tablet Take 1-2 tablets by mouth every 4 (four) hours as needed for moderate pain. 02/29/16   Earnstine Regal, PA-C  ibuprofen (ADVIL,MOTRIN) 200 MG tablet You can take 2-3 tablets every 6 hours safely.  Do not take more.  Use the prescribed pain medicine for pain not relieved by Ibuprofen or plain Tylenol. 02/29/16   Earnstine Regal, PA-C  Multiple Vitamins-Iron (MULTIVITAMINS WITH IRON) TABS tablet You can get the Women's One per day type vitamin and take one daily.  Take after you eat, it is easier on your stomach.  Buy this over the counter at any drug store. 02/29/16   Earnstine Regal, PA-C  naproxen (NAPROSYN) 500 MG tablet Take 1 tablet (500 mg total) by mouth 2 (two) times daily. 11/03/16 11/13/16  Ok Edwards, PA-C  saccharomyces boulardii (FLORASTOR) 250 MG capsule You can buy this over the counter at any drug store.  This help prevent colon infections from all the antibiotics you are on.  I would get one package and follow directions.  Once your off antibiotics you will not need this. 02/29/16   Earnstine Regal,  PA-C  sulfamethoxazole-trimethoprim (BACTRIM DS,SEPTRA DS) 800-160 MG tablet Take 1 tablet by mouth 2 (two) times daily. 11/03/16 11/10/16  Ok Edwards, PA-C    Family History Family History  Problem Relation Age of Onset  . Diabetes Mother   . COPD Father     Social History Social History  Substance Use Topics  . Smoking status: Current Every Day Smoker    Packs/day: 0.33    Years: 28.00    Types: Cigarettes  . Smokeless tobacco: Never Used  . Alcohol use 1.2 oz/week    2 Glasses of wine per week     Allergies   Percocet [oxycodone-acetaminophen] and  Valium [diazepam]   Review of Systems Review of Systems  Reason unable to perform ROS: See HPI as above.     Physical Exam Triage Vital Signs ED Triage Vitals [11/03/16 1804]  Enc Vitals Group     BP 122/73     Pulse Rate 87     Resp      Temp 98.8 F (37.1 C)     Temp Source Oral     SpO2 100 %     Weight      Height      Head Circumference      Peak Flow      Pain Score 10     Pain Loc      Pain Edu?      Excl. in [place]?    No data found.   Updated Vital Signs BP 122/73 (BP Location: Left Arm)   Pulse 87   Temp 98.8 F (37.1 C) (Oral)   LMP 10/07/2016 (Approximate)   SpO2 100%   Visual Acuity Right Eye Distance:   Left Eye Distance:   Bilateral Distance:    Right Eye Near:   Left Eye Near:    Bilateral Near:     Physical Exam  Constitutional: She is oriented to person, place, and time. She appears well-developed and well-nourished. No distress.  HENT:  Head: Normocephalic and atraumatic.  Eyes: Pupils are equal, round, and reactive to light. Conjunctivae are normal.  Neurological: She is alert and oriented to person, place, and time.  Skin:  1.5cm abscess on left buttock right below sacral triangle. Erythema and increased warmth noted. No drainage noted. Tenderness to palpation.      UC Treatments / Results  Labs (all labs ordered are listed, but only abnormal results are displayed) Labs Reviewed - No data to display  EKG  EKG Interpretation None       Radiology No results found.  Procedures .Marland KitchenIncision and Drainage Date/Time: 11/03/2016 8:51 PM Performed by: Tasia Catchings, Aayat Hajjar V Authorized by: Cathlean Sauer V   Consent:    Consent obtained:  Verbal   Consent given by:  Patient   Risks discussed:  Bleeding, incomplete drainage, infection, pain and damage to other organs   Alternatives discussed:  Alternative treatment Location:    Type:  Abscess   Size:  1.5cm   Location:  Lower extremity   Lower extremity location:  Buttock   Buttock location:  L  buttock Pre-procedure details:    Skin preparation:  Betadine Anesthesia (see MAR for exact dosages):    Anesthesia method:  Local infiltration   Local anesthetic:  Lidocaine 1% w/o epi Procedure type:    Complexity:  Simple Procedure details:    Incision types:  Single straight   Incision depth:  Dermal   Scalpel blade:  11   Wound management:  Extensive  cleaning   Drainage:  Purulent   Drainage amount:  Moderate   Wound treatment:  Wound left open   Packing materials:  None Post-procedure details:    Patient tolerance of procedure:  Tolerated well, no immediate complications   (including critical care time)  Medications Ordered in UC Medications - No data to display   Initial Impression / Assessment and Plan / UC Course  I have reviewed the triage vital signs and the nursing notes.  Pertinent labs & imaging results that were available during my care of the patient were reviewed by me and considered in my medical decision making (see chart for details).     Patient tolerated I&D well. Start Keflex and Bactrim for remaining cellulitis. Naproxen for pain. Wound care instructions given. Patient to follow-up with PCP in 4 days for wound recheck. Return precautions given.   Final Clinical Impressions(s) / UC Diagnoses   Final diagnoses:  Abscess    New Prescriptions Discharge Medication List as of 11/03/2016  7:21 PM    START taking these medications   Details  cephALEXin (KEFLEX) 500 MG capsule Take 1 capsule (500 mg total) by mouth 4 (four) times daily., Starting Mon 11/03/2016, Normal    naproxen (NAPROSYN) 500 MG tablet Take 1 tablet (500 mg total) by mouth 2 (two) times daily., Starting Mon 11/03/2016, Until Thu 11/13/2016, Normal    sulfamethoxazole-trimethoprim (BACTRIM DS,SEPTRA DS) 800-160 MG tablet Take 1 tablet by mouth 2 (two) times daily., Starting Mon 11/03/2016, Until Mon 11/10/2016, Normal          Cathlean Sauer V, [place] 11/03/16 2053

## 2016-11-03 NOTE — ED Triage Notes (Signed)
Pt reports recurrent boils.  She has one on her coccyx that presented two days ago.  She has tried to get it to erupt, but it will not and she is in a lot of pain.

## 2016-11-03 NOTE — Discharge Instructions (Signed)
Start keflex and bactrim as directed. Follow up with PCP or here for wound recheck in 3-4 days. Monitor for any worsening of symptoms, increasing pain, increasing redness, fever, follow up here or at the emergency department for further evaluation.

## 2017-03-26 ENCOUNTER — Ambulatory Visit (HOSPITAL_COMMUNITY)
Admission: EM | Admit: 2017-03-26 | Discharge: 2017-03-26 | Disposition: A | Discharge status: Home / Self Care | Payer: Medicaid Other | Attending: Family Medicine | Admitting: Family Medicine

## 2017-03-26 ENCOUNTER — Encounter (HOSPITAL_COMMUNITY): Payer: Self-pay | Admitting: Emergency Medicine

## 2017-03-26 DIAGNOSIS — L0231 Cutaneous abscess of buttock: Secondary | ICD-10-CM

## 2017-03-26 MED ORDER — LIDOCAINE-EPINEPHRINE 2 %-1:200000 IJ SOLN
INTRAMUSCULAR | Status: AC
Start: 2017-03-26 — End: ?
  Filled 2017-03-26: qty 20

## 2017-03-26 MED ORDER — DOXYCYCLINE HYCLATE 100 MG PO TABS: 100.0000 mg | ORAL_TABLET | Freq: Two times a day (BID) | ORAL | 0 refills | Status: DC

## 2017-03-26 NOTE — ED Triage Notes (Signed)
PT has an abscess to gential area for 3 days.

## 2017-03-26 NOTE — Discharge Instructions (Signed)
Make sure you soak in a tub 2 to three times a day for the next 5 days.

## 2017-03-26 NOTE — ED Provider Notes (Signed)
[place]   [id number] 03/26/17 Arrival Time: 1024   SUBJECTIVE:  Nicole Browning is a 47 y.o. female who presents to the urgent care with complaint of genital area abscess for three days.  She has had large abscess in the perirectal area in the past  Past Medical History:  Diagnosis Date  . Anemia   . GERD (gastroesophageal reflux disease)   . Perirectal abscess   . Protein-calorie malnutrition ([place]) 02/29/2016  . Tobacco use    Family History  Problem Relation Age of Onset  . Diabetes Mother   . COPD Father    Social History   Socioeconomic History  . Marital status: Single    Spouse name: Not on file  . Number of children: Not on file  . Years of education: Not on file  . Highest education level: Not on file  Social Needs  . Financial resource strain: Not on file  . Food insecurity - worry: Not on file  . Food insecurity - inability: Not on file  . Transportation needs - medical: Not on file  . Transportation needs - non-medical: Not on file  Occupational History  . Not on file  Tobacco Use  . Smoking status: Current Every Day Smoker    Packs/day: 0.33    Years: 28.00    Pack years: 9.24    Types: Cigarettes  . Smokeless tobacco: Never Used  Substance and Sexual Activity  . Alcohol use: Yes    Alcohol/week: 1.2 oz    Types: 2 Glasses of wine per week  . Drug use: No  . Sexual activity: Not Currently  Other Topics Concern  . Not on file  Social History Narrative  . Not on file   Current Meds  Medication Sig  . acetaminophen (TYLENOL) 325 MG tablet Take 2 tablets (650 mg total) by mouth every 6 (six) hours as needed for mild pain (or temp > 100).  Marland Kitchen esomeprazole (NEXIUM) 40 MG capsule Take 40 mg by mouth daily as needed (reflux).   . Multiple Vitamins-Iron (MULTIVITAMINS WITH IRON) TABS tablet You can get the Women's One per day type vitamin and take one daily.  Take after you eat, it is easier on your stomach.  Buy this over the counter at  any drug store.   Allergies  Allergen Reactions  . Percocet [Oxycodone-Acetaminophen] Nausea Only  . Valium [Diazepam] Nausea Only      ROS: As per HPI, remainder of ROS negative.   OBJECTIVE:   Vitals:   03/26/17 1050  BP: 107/69  Pulse: 97  Resp: 16  Temp: 98.3 F (36.8 C)  TempSrc: Oral  SpO2: 100%  Weight: 125 lb (56.7 kg)  Height: 5' 8"  (1.727 m)     General appearance: alert; no distress Eyes: PERRL; EOMI; conjunctiva normal HENT: normocephalic; atraumatic; Neck: supple Perineum:  Large perirectal abscess on right:  anesth with xylo with epi and drained of 10 cc thick mucopurulent and sanguinous pus Back: no CVA tenderness Extremities: no cyanosis or edema; symmetrical with no gross deformities Skin: warm and dry Neurologic: normal gait; grossly normal Psychological: alert and cooperative; normal mood and affect      Labs:  Results for orders placed or performed during the hospital encounter of 02/24/16  Blood culture (routine x 2)  Result Value Ref Range   Specimen Description BLOOD LEFT ANTECUBITAL    Special Requests      BOTTLES DRAWN AEROBIC AND ANAEROBIC  10CC AER AND 5CC ANA  Culture NO GROWTH 5 DAYS    Report Status 02/29/2016 FINAL   Blood culture (routine x 2)  Result Value Ref Range   Specimen Description BLOOD RIGHT ANTECUBITAL    Special Requests BOTTLES DRAWN AEROBIC ONLY  5CC    Culture NO GROWTH 5 DAYS    Report Status 02/29/2016 FINAL   Aerobic/Anaerobic Culture (surgical/deep wound)  Result Value Ref Range   Specimen Description ABSCESS RECTAL    Special Requests NONE    Gram Stain      ABUNDANT WBC PRESENT, PREDOMINANTLY PMN ABUNDANT GRAM POSITIVE RODS ABUNDANT GRAM NEGATIVE COCCOBACILLI FEW GRAM POSITIVE COCCI IN PAIRS    Culture      FEW DIPHTHEROIDS(CORYNEBACTERIUM SPECIES) Standardized susceptibility testing for this organism is not available. MIXED ANAEROBIC FLORA PRESENT.  CALL LAB IF FURTHER IID REQUIRED.     Report Status 03/02/2016 FINAL   Culture, Urine  Result Value Ref Range   Specimen Description URINE, RANDOM    Special Requests NONE    Culture NO GROWTH    Report Status 02/29/2016 FINAL   Comprehensive metabolic panel  Result Value Ref Range   Sodium 137 135 - 145 mmol/L   Potassium 2.9 (L) 3.5 - 5.1 mmol/L   Chloride 101 101 - 111 mmol/L   CO2 27 22 - 32 mmol/L   Glucose, Bld 123 (H) 65 - 99 mg/dL   BUN 5 (L) 6 - 20 mg/dL   Creatinine, Ser 0.75 0.44 - 1.00 mg/dL   Calcium 8.4 (L) 8.9 - 10.3 mg/dL   Total Protein 6.1 (L) 6.5 - 8.1 g/dL   Albumin 1.8 (L) 3.5 - 5.0 g/dL   AST 17 15 - 41 U/L   ALT 12 (L) 14 - 54 U/L   Alkaline Phosphatase 66 38 - 126 U/L   Total Bilirubin 0.6 0.3 - 1.2 mg/dL   GFR calc non Af Amer >60 >60 mL/min   GFR calc Af Amer >60 >60 mL/min   Anion gap 9 5 - 15  CBC with Differential  Result Value Ref Range   WBC 8.6 4.0 - 10.5 K/uL   RBC 2.95 (L) 3.87 - 5.11 MIL/uL   Hemoglobin 8.1 (L) 12.0 - 15.0 g/dL   HCT 23.4 (L) 36.0 - 46.0 %   MCV 79.3 78.0 - 100.0 fL   MCH 27.5 26.0 - 34.0 pg   MCHC 34.6 30.0 - 36.0 g/dL   RDW 13.6 11.5 - 15.5 %   Platelets 368 150 - 400 K/uL   Neutrophils Relative % 69 %   Lymphocytes Relative 21 %   Monocytes Relative 9 %   Eosinophils Relative 1 %   Basophils Relative 0 %   Neutro Abs 5.9 1.7 - 7.7 K/uL   Lymphs Abs 1.8 0.7 - 4.0 K/uL   Monocytes Absolute 0.8 0.1 - 1.0 K/uL   Eosinophils Absolute 0.1 0.0 - 0.7 K/uL   Basophils Absolute 0.0 0.0 - 0.1 K/uL   RBC Morphology POLYCHROMASIA PRESENT   Protime-INR  Result Value Ref Range   Prothrombin Time 15.2 11.4 - 15.2 seconds   INR 1.19   CBC  Result Value Ref Range   WBC 11.2 (H) 4.0 - 10.5 K/uL   RBC 2.73 (L) 3.87 - 5.11 MIL/uL   Hemoglobin 7.3 (L) 12.0 - 15.0 g/dL   HCT 21.6 (L) 36.0 - 46.0 %   MCV 79.1 78.0 - 100.0 fL   MCH 26.7 26.0 - 34.0 pg   MCHC 33.8 30.0 - 36.0 g/dL  RDW 13.5 11.5 - 15.5 %   Platelets 312 150 - 400 K/uL  Basic metabolic panel    Result Value Ref Range   Sodium 136 135 - 145 mmol/L   Potassium 4.5 3.5 - 5.1 mmol/L   Chloride 108 101 - 111 mmol/L   CO2 23 22 - 32 mmol/L   Glucose, Bld 218 (H) 65 - 99 mg/dL   BUN 6 6 - 20 mg/dL   Creatinine, Ser 0.70 0.44 - 1.00 mg/dL   Calcium 7.3 (L) 8.9 - 10.3 mg/dL   GFR calc non Af Amer >60 >60 mL/min   GFR calc Af Amer >60 >60 mL/min   Anion gap 5 5 - 15  CBC  Result Value Ref Range   WBC 8.7 4.0 - 10.5 K/uL   RBC 2.58 (L) 3.87 - 5.11 MIL/uL   Hemoglobin 6.9 (LL) 12.0 - 15.0 g/dL   HCT 20.7 (L) 36.0 - 46.0 %   MCV 80.2 78.0 - 100.0 fL   MCH 26.7 26.0 - 34.0 pg   MCHC 33.3 30.0 - 36.0 g/dL   RDW 13.8 11.5 - 15.5 %   Platelets 291 150 - 400 K/uL  Basic metabolic panel  Result Value Ref Range   Sodium 138 135 - 145 mmol/L   Potassium 4.6 3.5 - 5.1 mmol/L   Chloride 108 101 - 111 mmol/L   CO2 22 22 - 32 mmol/L   Glucose, Bld 132 (H) 65 - 99 mg/dL   BUN 7 6 - 20 mg/dL   Creatinine, Ser 0.86 0.44 - 1.00 mg/dL   Calcium 7.4 (L) 8.9 - 10.3 mg/dL   GFR calc non Af Amer >60 >60 mL/min   GFR calc Af Amer >60 >60 mL/min   Anion gap 8 5 - 15  CBC  Result Value Ref Range   WBC 8.4 4.0 - 10.5 K/uL   RBC 2.66 (L) 3.87 - 5.11 MIL/uL   Hemoglobin 7.1 (L) 12.0 - 15.0 g/dL   HCT 21.4 (L) 36.0 - 46.0 %   MCV 80.5 78.0 - 100.0 fL   MCH 26.7 26.0 - 34.0 pg   MCHC 33.2 30.0 - 36.0 g/dL   RDW 13.8 11.5 - 15.5 %   Platelets 315 150 - 400 K/uL  Hemoglobin A1c  Result Value Ref Range   Hgb A1c MFr Bld 5.7 (H) 4.8 - 5.6 %   Mean Plasma Glucose 117 mg/dL  Vitamin B12  Result Value Ref Range   Vitamin B-12 792 180 - 914 pg/mL  Folate  Result Value Ref Range   Folate 9.4 >5.9 ng/mL  Iron and TIBC  Result Value Ref Range   Iron 64 28 - 170 ug/dL   TIBC 162 (L) 250 - 450 ug/dL   Saturation Ratios 39 (H) 10.4 - 31.8 %   UIBC 98 ug/dL  Ferritin  Result Value Ref Range   Ferritin 180 11 - 307 ng/mL  Reticulocytes  Result Value Ref Range   Retic Ct Pct 0.5 0.4 - 3.1 %    RBC. 2.66 (L) 3.87 - 5.11 MIL/uL   Retic Count, Absolute 13.3 (L) 19.0 - 186.0 K/uL  HIV antibody  Result Value Ref Range   HIV Screen 4th Generation wRfx Non Reactive Non Reactive  CBC  Result Value Ref Range   WBC 7.1 4.0 - 10.5 K/uL   RBC 2.96 (L) 3.87 - 5.11 MIL/uL   Hemoglobin 8.0 (L) 12.0 - 15.0 g/dL   HCT 23.7 (L) 36.0 - 46.0 %  MCV 80.1 78.0 - 100.0 fL   MCH 27.0 26.0 - 34.0 pg   MCHC 33.8 30.0 - 36.0 g/dL   RDW 13.6 11.5 - 15.5 %   Platelets 295 150 - 400 K/uL  Basic metabolic panel  Result Value Ref Range   Sodium 137 135 - 145 mmol/L   Potassium 4.3 3.5 - 5.1 mmol/L   Chloride 104 101 - 111 mmol/L   CO2 27 22 - 32 mmol/L   Glucose, Bld 110 (H) 65 - 99 mg/dL   BUN <5 (L) 6 - 20 mg/dL   Creatinine, Ser 0.80 0.44 - 1.00 mg/dL   Calcium 8.2 (L) 8.9 - 10.3 mg/dL   GFR calc non Af Amer >60 >60 mL/min   GFR calc Af Amer >60 >60 mL/min   Anion gap 6 5 - 15  Prealbumin  Result Value Ref Range   Prealbumin 12.0 (L) 18 - 38 mg/dL  CBC  Result Value Ref Range   WBC 7.3 4.0 - 10.5 K/uL   RBC 2.53 (L) 3.87 - 5.11 MIL/uL   Hemoglobin 6.8 (LL) 12.0 - 15.0 g/dL   HCT 20.1 (L) 36.0 - 46.0 %   MCV 79.4 78.0 - 100.0 fL   MCH 26.9 26.0 - 34.0 pg   MCHC 33.8 30.0 - 36.0 g/dL   RDW 13.4 11.5 - 15.5 %   Platelets 295 150 - 400 K/uL  Basic metabolic panel  Result Value Ref Range   Sodium 135 135 - 145 mmol/L   Potassium 4.1 3.5 - 5.1 mmol/L   Chloride 102 101 - 111 mmol/L   CO2 27 22 - 32 mmol/L   Glucose, Bld 89 65 - 99 mg/dL   BUN 5 (L) 6 - 20 mg/dL   Creatinine, Ser 0.74 0.44 - 1.00 mg/dL   Calcium 8.1 (L) 8.9 - 10.3 mg/dL   GFR calc non Af Amer >60 >60 mL/min   GFR calc Af Amer >60 >60 mL/min   Anion gap 6 5 - 15  Glucose, capillary  Result Value Ref Range   Glucose-Capillary 85 65 - 99 mg/dL   Comment 1 Notify RN   Glucose, capillary  Result Value Ref Range   Glucose-Capillary 90 65 - 99 mg/dL  Glucose, capillary  Result Value Ref Range   Glucose-Capillary 85  65 - 99 mg/dL  Urinalysis, Routine w reflex microscopic  Result Value Ref Range   Color, Urine YELLOW YELLOW   APPearance CLEAR CLEAR   Specific Gravity, Urine 1.010 1.005 - 1.030   pH 7.0 5.0 - 8.0   Glucose, UA NEGATIVE NEGATIVE mg/dL   Hgb urine dipstick SMALL (A) NEGATIVE   Bilirubin Urine NEGATIVE NEGATIVE   Ketones, ur NEGATIVE NEGATIVE mg/dL   Protein, ur NEGATIVE NEGATIVE mg/dL   Nitrite NEGATIVE NEGATIVE   Leukocytes, UA NEGATIVE NEGATIVE   RBC / HPF 0-5 0 - 5 RBC/hpf   WBC, UA 0-5 0 - 5 WBC/hpf   Bacteria, UA RARE (A) NONE SEEN   Squamous Epithelial / LPF 0-5 (A) NONE SEEN   Hyaline Casts, UA PRESENT   Glucose, capillary  Result Value Ref Range   Glucose-Capillary 100 (H) 65 - 99 mg/dL   Comment 1 Notify RN   CBC  Result Value Ref Range   WBC 7.0 4.0 - 10.5 K/uL   RBC 3.30 (L) 3.87 - 5.11 MIL/uL   Hemoglobin 8.9 (L) 12.0 - 15.0 g/dL   HCT 26.4 (L) 36.0 - 46.0 %   MCV 80.0 78.0 -  100.0 fL   MCH 27.0 26.0 - 34.0 pg   MCHC 33.7 30.0 - 36.0 g/dL   RDW 13.6 11.5 - 15.5 %   Platelets 308 150 - 400 K/uL  Basic metabolic panel  Result Value Ref Range   Sodium 134 (L) 135 - 145 mmol/L   Potassium 4.4 3.5 - 5.1 mmol/L   Chloride 99 (L) 101 - 111 mmol/L   CO2 30 22 - 32 mmol/L   Glucose, Bld 84 65 - 99 mg/dL   BUN <5 (L) 6 - 20 mg/dL   Creatinine, Ser 0.83 0.44 - 1.00 mg/dL   Calcium 8.6 (L) 8.9 - 10.3 mg/dL   GFR calc non Af Amer >60 >60 mL/min   GFR calc Af Amer >60 >60 mL/min   Anion gap 5 5 - 15  Glucose, capillary  Result Value Ref Range   Glucose-Capillary 88 65 - 99 mg/dL   Comment 1 Notify RN   Glucose, capillary  Result Value Ref Range   Glucose-Capillary 123 (H) 65 - 99 mg/dL  Glucose, capillary  Result Value Ref Range   Glucose-Capillary 83 65 - 99 mg/dL  Glucose, capillary  Result Value Ref Range   Glucose-Capillary 104 (H) 65 - 99 mg/dL  I-Stat CG4 Lactic Acid, ED  Result Value Ref Range   Lactic Acid, Venous 2.19 (HH) 0.5 - 1.9 mmol/L    I-Stat CG4 Lactic Acid, ED  Result Value Ref Range   Lactic Acid, Venous 0.98 0.5 - 1.9 mmol/L  I-Stat beta hCG blood, ED  Result Value Ref Range   I-stat hCG, quantitative <5.0 <5 mIU/mL   Comment 3          Type and screen  Result Value Ref Range   ABO/RH(D) O POS    Antibody Screen NEG    Sample Expiration 02/27/2016   ABO/Rh  Result Value Ref Range   ABO/RH(D) O POS   Type and screen [place]  Result Value Ref Range   ISSUE DATE / TIME [id number]    Blood Product Unit Number [id number]    PRODUCT CODE [id number]    Unit Type and Rh 5100    Blood Product Expiration Date [id number]   Prepare RBC  Result Value Ref Range   Order Confirmation ORDER PROCESSED BY BLOOD BANK     Labs Reviewed - No data to display  No results found.     ASSESSMENT & PLAN:  1. Abscess of buttock, right     Meds ordered this encounter  Medications  . doxycycline (VIBRA-TABS) 100 MG tablet    Sig: Take 1 tablet (100 mg total) by mouth 2 (two) times daily.    Dispense:  20 tablet    Refill:  0    Reviewed expectations re: course of current medical issues. Questions answered. Outlined signs and symptoms indicating need for more acute intervention. Patient verbalized understanding. After Visit Summary given.    Procedures:  Perineum abscess I&D      Robyn Haber, MD 03/26/17 1150

## 2017-03-26 NOTE — ED Notes (Signed)
At bedside with provider during procedure.

## 2017-07-20 ENCOUNTER — Encounter (HOSPITAL_COMMUNITY): Payer: Self-pay | Admitting: Emergency Medicine

## 2017-07-20 ENCOUNTER — Ambulatory Visit (HOSPITAL_COMMUNITY)
Admission: EM | Admit: 2017-07-20 | Discharge: 2017-07-20 | Disposition: A | Discharge status: Home / Self Care | Payer: Medicaid Other | Attending: Family Medicine | Admitting: Family Medicine

## 2017-07-20 DIAGNOSIS — K047 Periapical abscess without sinus: Secondary | ICD-10-CM | POA: Diagnosis not present

## 2017-07-20 DIAGNOSIS — K029 Dental caries, unspecified: Secondary | ICD-10-CM | POA: Diagnosis not present

## 2017-07-20 DIAGNOSIS — K0889 Other specified disorders of teeth and supporting structures: Secondary | ICD-10-CM | POA: Diagnosis not present

## 2017-07-20 MED ORDER — KETOROLAC TROMETHAMINE 60 MG/2ML IM SOLN
60.0000 mg | Freq: Once | INTRAMUSCULAR | Status: AC
  Administered 2017-07-20: 60 mg via INTRAMUSCULAR

## 2017-07-20 MED ORDER — HYDROCODONE-ACETAMINOPHEN 5-325 MG PO TABS: 1.0000 | ORAL_TABLET | Freq: Four times a day (QID) | ORAL | 0 refills | Status: DC | PRN

## 2017-07-20 MED ORDER — AMOXICILLIN-POT CLAVULANATE 875-125 MG PO TABS: 1.0000 | ORAL_TABLET | Freq: Two times a day (BID) | ORAL | 0 refills | Status: AC

## 2017-07-20 MED ORDER — NAPROXEN 500 MG PO TABS: 500.0000 mg | ORAL_TABLET | Freq: Two times a day (BID) | ORAL | 0 refills | Status: DC | PRN

## 2017-07-20 MED ORDER — KETOROLAC TROMETHAMINE 60 MG/2ML IM SOLN
INTRAMUSCULAR | Status: AC
  Filled 2017-07-20: qty 2

## 2017-07-20 NOTE — ED Provider Notes (Signed)
[place]    CSN: [id number] Arrival date & time: 07/20/17  1023     History   Chief Complaint Chief Complaint  Patient presents with  . Dental Pain    HPI Nicole Browning is a 47 y.o. female.   47 year old female presents with left lower dental pain that started about 3 days ago. The pain has gotten much worse yesterday, with facial swelling. Denies any fever or discharge from the molar. No sore throat, nausea or vomiting. Pain level 10/10. She has tried rinsing with salt water, hydrogen peroxide and taking Aleve with no relief. Unable to sleep due to pain. Does not currently have a dentist since every dentist she has called so far in [place] does not take Medicaid. Has history of recurrent skin abscesses and has been on Bactrim, Doxycycline and Keflex with varying success. Also has history of GERD and smokes less than 1/2 pack of cigarettes daily. Not currently on any medication for chronic conditions.   The history is provided by the patient.  Dental Pain  Location:  Lower Lower teeth location:  17/LL 3rd molar Quality:  Constant and throbbing Severity:  Severe Onset quality:  Gradual Duration:  3 days Timing:  Constant Progression:  Worsening Chronicity:  Recurrent Context: dental caries, filling fell out and poor dentition   Previous work-up:  Filled cavity Relieved by:  Nothing Ineffective treatments:  NSAIDs and rest Associated symptoms: facial pain, facial swelling, gum swelling and headaches   Associated symptoms: no congestion, no difficulty swallowing, no drooling, no fever, no neck pain, no neck swelling, no oral bleeding, no oral lesions and no trismus   Risk factors: lack of dental care, periodontal disease and smoking   Risk factors: no cancer and no diabetes     Past Medical History:  Diagnosis Date  . Anemia   . GERD (gastroesophageal reflux disease)   . Perirectal abscess   . Protein-calorie malnutrition ([place]) 02/29/2016  . Tobacco use      Patient Active Problem List   Diagnosis Date Noted  . Protein-calorie malnutrition ([place]) 02/29/2016  . Elevated hemoglobin A1c 02/27/2016  . GERD (gastroesophageal reflux disease)   . Tobacco use   . Anemia of chronic disease   . Prediabetes   . Perirectal abscess 02/24/2016    Past Surgical History:  Procedure Laterality Date  . IRRIGATION AND DEBRIDEMENT ABSCESS N/A 02/24/2016   Procedure: IRRIGATION AND DEBRIDEMENT ABSCESS;  Surgeon: Stark Klein, MD;  Location: [facility];  Service: General;  Laterality: N/A;    OB History   None      Home Medications    Prior to Admission medications   Medication Sig Start Date End Date Taking? Authorizing Provider  amoxicillin-clavulanate (AUGMENTIN) 875-125 MG tablet Take 1 tablet by mouth every 12 (twelve) hours for 10 days. 07/20/17 07/30/17  Katy Apo, NP  HYDROcodone-acetaminophen (NORCO/VICODIN) 5-325 MG tablet Take 1-2 tablets by mouth every 6 (six) hours as needed for severe pain. 07/20/17   Katy Apo, NP  naproxen (NAPROSYN) 500 MG tablet Take 1 tablet (500 mg total) by mouth 2 (two) times daily as needed for moderate pain. 07/20/17   Katy Apo, NP    Family History Family History  Problem Relation Age of Onset  . Diabetes Mother   . COPD Father     Social History Social History   Tobacco Use  . Smoking status: Current Every Day Smoker    Packs/day: 0.33    Years: 28.00  Pack years: 9.24    Types: Cigarettes  . Smokeless tobacco: Never Used  Substance Use Topics  . Alcohol use: Yes    Alcohol/week: 1.2 oz    Types: 2 Glasses of wine per week  . Drug use: No     Allergies   Percocet [oxycodone-acetaminophen] and Valium [diazepam]   Review of Systems Review of Systems  Constitutional: Positive for appetite change and fatigue. Negative for chills and fever.  HENT: Positive for dental problem and facial swelling. Negative for congestion, drooling, ear discharge, ear pain, mouth sores,  nosebleeds, postnasal drip, rhinorrhea, sinus pressure, sinus pain, sore throat and trouble swallowing.   Eyes: Negative for photophobia and visual disturbance.  Gastrointestinal: Negative for nausea and vomiting.  Musculoskeletal: Negative for arthralgias, myalgias, neck pain and neck stiffness.  Skin: Negative for color change, rash and wound.  Neurological: Positive for headaches. Negative for dizziness, tremors, seizures, syncope, facial asymmetry, speech difficulty, weakness, light-headedness and numbness.  Hematological: Negative for adenopathy. Does not bruise/bleed easily.     Physical Exam Triage Vital Signs ED Triage Vitals [07/20/17 1152]  Enc Vitals Group     BP (!) 118/96     Pulse Rate 65     Resp 18     Temp 98.8 F (37.1 C)     Temp Source Oral     SpO2 100 %     Weight      Height      Head Circumference      Peak Flow      Pain Score      Pain Loc      Pain Edu?      Excl. in [place]?    No data found.  Updated Vital Signs BP (!) 118/96 (BP Location: Left Arm)   Pulse 65   Temp 98.8 F (37.1 C) (Oral)   Resp 18   SpO2 100%   Visual Acuity Right Eye Distance:   Left Eye Distance:   Bilateral Distance:    Right Eye Near:   Left Eye Near:    Bilateral Near:     Physical Exam  Constitutional: She is oriented to person, place, and time. She appears well-developed and well-nourished. She is cooperative. No distress.  Patient sitting on exam table and holding her left jaw and appears in pain.   HENT:  Head: Normocephalic. Head is without contusion.    Right Ear: Hearing, tympanic membrane, external ear and ear canal normal.  Left Ear: Hearing, tympanic membrane, external ear and ear canal normal.  Nose: Nose normal.  Mouth/Throat: Oropharynx is clear and moist and mucous membranes are normal. She has dentures (upper ). No oral lesions. Abnormal dentition. Dental abscesses and dental caries present.    Tender along lower jaw line, especially toward  base of ear. Slight swelling present but no distinct redness of skin.  Swelling and redness present, especially on lateral aspect of 3rd molar on left lower area. Part of filling missing. Very tender along gum line. No distinct pus or discharge seen. Multiple teeth with dental caries and missing other molars. Upper dentures present.    Eyes: Conjunctivae and EOM are normal.  Neck: Normal range of motion. Neck supple.  Cardiovascular: Normal rate.  Pulmonary/Chest: Effort normal.  Lymphadenopathy:    She has no cervical adenopathy.  Neurological: She is alert and oriented to person, place, and time.  Skin: Skin is warm, dry and intact. Capillary refill takes less than 2 seconds. No rash noted. No erythema.  Psychiatric: She has a normal mood and affect. Her speech is normal and behavior is normal. Thought content normal.  Vitals reviewed.    UC Treatments / Results  Labs (all labs ordered are listed, but only abnormal results are displayed) Labs Reviewed - No data to display  EKG None  Radiology No results found.  Procedures Procedures (including critical care time)  Medications Ordered in UC Medications  ketorolac (TORADOL) injection 60 mg (60 mg Intramuscular Given 07/20/17 1259)    Initial Impression / Assessment and Plan / UC Course  I have reviewed the triage vital signs and the nursing notes.  Pertinent labs & imaging results that were available during my care of the patient were reviewed by me and considered in my medical decision making (see chart for details).    Discussed that she probably has a dental infection and possible abscess. Recommend start Augmentin 842m twice a day as directed. Gave Toradol 671mIM now for pain. May take Naproxen 50049mwice a day as needed for pain and swelling. For severe pain, may take Vicodin 1 to 2 tablets every 6 hours as needed #10 no refill written. Encouraged to apply ice/cool compresses to area for comfort. Discussed that we will  try to help her find a local dentist. Note written for work. Follow-up here in 3 to 4 days if not improving.  Final Clinical Impressions(s) / UC Diagnoses   Final diagnoses:  Dental infection  Pain, dental  Dental caries     Discharge Instructions     You were given a shot of Toradol today for pain. Recommend start Augmentin 875m63mntibiotic) twice a day with food for 10 days. May take Naproxen 500mg8mce a day as needed for pain. For more severe pain, may take Vicodin 1 to 2 tablets every 6 hours as needed. Recommend apply cool compresses to area for comfort. Recommend try to call and find a dentist for continued care. Otherwise follow-up here in 3 to 4 days if no improvement.     ED Prescriptions    Medication Sig Dispense Auth. Provider   amoxicillin-clavulanate (AUGMENTIN) 875-125 MG tablet Take 1 tablet by mouth every 12 (twelve) hours for 10 days. 20 tablet AmyotKaty Apo  naproxen (NAPROSYN) 500 MG tablet Take 1 tablet (500 mg total) by mouth 2 (two) times daily as needed for moderate pain. 30 tablet AmyotKaty Apo  HYDROcodone-acetaminophen (NORCO/VICODIN) 5-325 MG tablet Take 1-2 tablets by mouth every 6 (six) hours as needed for severe pain. 10 tablet AmyotKaty Apo    Controlled Substance Prescriptions [place] Controlled Substance Registry consulted? Yes, I have consulted the [place] Controlled Substances Registry for this patient, and feel the risk/benefit ratio today is favorable for proceeding with this prescription for a controlled substance. Last active Rx was for Vicodin in 2017. No current Rx. Patient is nauseous with Percocet but can tolerate Vicodin.    AmyotKaty Apo05/20/19 2117

## 2017-07-20 NOTE — ED Triage Notes (Signed)
Pt here for dental pain

## 2017-07-20 NOTE — Discharge Instructions (Addendum)
You were given a shot of Toradol today for pain. Recommend start Augmentin 851m (antibiotic) twice a day with food for 10 days. May take Naproxen 5044mtwice a day as needed for pain. For more severe pain, may take Vicodin 1 to 2 tablets every 6 hours as needed. Recommend apply cool compresses to area for comfort. Recommend try to call and find a dentist for continued care. Otherwise follow-up here in 3 to 4 days if no improvement.

## 2017-11-04 ENCOUNTER — Other Ambulatory Visit: Payer: Self-pay

## 2017-11-04 ENCOUNTER — Ambulatory Visit (HOSPITAL_COMMUNITY)
Admission: EM | Admit: 2017-11-04 | Discharge: 2017-11-04 | Disposition: A | Discharge status: Home / Self Care | Payer: Medicaid Other | Attending: Family Medicine | Admitting: Family Medicine

## 2017-11-04 ENCOUNTER — Encounter (HOSPITAL_COMMUNITY): Payer: Self-pay | Admitting: Emergency Medicine

## 2017-11-04 DIAGNOSIS — K0889 Other specified disorders of teeth and supporting structures: Secondary | ICD-10-CM

## 2017-11-04 DIAGNOSIS — K047 Periapical abscess without sinus: Secondary | ICD-10-CM

## 2017-11-04 MED ORDER — AMOXICILLIN-POT CLAVULANATE 875-125 MG PO TABS: 1.0000 | ORAL_TABLET | Freq: Two times a day (BID) | ORAL | 0 refills | Status: DC

## 2017-11-04 MED ORDER — HYDROCODONE-ACETAMINOPHEN 5-325 MG PO TABS: 1.0000 | ORAL_TABLET | Freq: Four times a day (QID) | ORAL | 0 refills | Status: DC | PRN

## 2017-11-04 NOTE — Discharge Instructions (Addendum)
Keep your upcoming dental appointment.  Be aware, pain medications may cause drowsiness. Please do not drive, operate heavy machinery or make important decisions while on this medication, it can cloud your judgement.

## 2017-11-04 NOTE — ED Provider Notes (Signed)
[place]   [id number] 11/04/17 Arrival Time: 1837  ASSESSMENT & PLAN:  1. Pain, dental   2. Dental infection     Meds ordered this encounter  Medications  . amoxicillin-clavulanate (AUGMENTIN) 875-125 MG tablet    Sig: Take 1 tablet by mouth every 12 (twelve) hours.    Dispense:  20 tablet    Refill:  0  . HYDROcodone-acetaminophen (NORCO/VICODIN) 5-325 MG tablet    Sig: Take 1 tablet by mouth every 6 (six) hours as needed for moderate pain or severe pain.    Dispense:  8 tablet    Refill:  0    [place] Controlled Substances Registry consulted for this patient. I feel the risk/benefit ratio today is favorable for proceeding with this prescription for a controlled substance. Medication sedation precautions given.  Dental resource written instructions given. She will schedule dental evaluation as soon as possible.   Reviewed expectations re: course of current medical issues. Questions answered. Outlined signs and symptoms indicating need for more acute intervention. Patient verbalized understanding. After Visit Summary given.   SUBJECTIVE:  Nicole Browning is a 47 y.o. female who reports gradual onset of left lower dental pain described as an ache. Present for 2-3 days. Afebrile. Tolerating PO intake but reports pain with chewing. Normal swallowing. She does not see a dentist regularly. No neck swelling or pain. OTC analgesics without relief.  ROS: As per HPI.  OBJECTIVE:  Vitals:   11/04/17 1902  BP: 113/67  Pulse: 75  Resp: 18  Temp: 98.5 F (36.9 C)  TempSrc: Oral  SpO2: 100%    General appearance: alert; no distress HENT: normocephalic; atraumatic; dentition: fair; gingival hypertrophy over left lower gums without areas of fluctuance; some tenderness to touch Neck: supple without LAD Lungs: normal respirations Skin: warm and dry Psychological: alert and cooperative; normal mood and affect  Allergies  Allergen Reactions  . Percocet  [Oxycodone-Acetaminophen] Nausea Only  . Valium [Diazepam] Nausea Only    Past Medical History:  Diagnosis Date  . Anemia   . GERD (gastroesophageal reflux disease)   . Perirectal abscess   . Protein-calorie malnutrition ([place]) 02/29/2016  . Tobacco use    Social History   Socioeconomic History  . Marital status: Single    Spouse name: Not on file  . Number of children: Not on file  . Years of education: Not on file  . Highest education level: Not on file  Occupational History  . Not on file  Social Needs  . Financial resource strain: Not on file  . Food insecurity:    Worry: Not on file    Inability: Not on file  . Transportation needs:    Medical: Not on file    Non-medical: Not on file  Tobacco Use  . Smoking status: Current Every Day Smoker    Packs/day: 0.33    Years: 28.00    Pack years: 9.24    Types: Cigarettes  . Smokeless tobacco: Never Used  Substance and Sexual Activity  . Alcohol use: Yes    Alcohol/week: 2.0 standard drinks    Types: 2 Glasses of wine per week  . Drug use: No  . Sexual activity: Not Currently  Lifestyle  . Physical activity:    Days per week: Not on file    Minutes per session: Not on file  . Stress: Not on file  Relationships  . Social connections:    Talks on phone: Not on file    Gets together: Not on  file    Attends religious service: Not on file    Active member of club or organization: Not on file    Attends meetings of clubs or organizations: Not on file    Relationship status: Not on file  . Intimate partner violence:    Fear of current or ex partner: Not on file    Emotionally abused: Not on file    Physically abused: Not on file    Forced sexual activity: Not on file  Other Topics Concern  . Not on file  Social History Narrative  . Not on file   Family History  Problem Relation Age of Onset  . Diabetes Mother   . COPD Father    Past Surgical History:  Procedure Laterality Date  . IRRIGATION AND  DEBRIDEMENT ABSCESS N/A 02/24/2016   Procedure: IRRIGATION AND DEBRIDEMENT ABSCESS;  Surgeon: Stark Klein, MD;  Location: [facility];  Service: General;  Laterality: Ginette Pitman, MD 11/10/17 [phone]

## 2017-11-04 NOTE — ED Triage Notes (Signed)
History of the same dental abscess.  Left side of face swollen, painful.  This episode started 2 days ago.

## 2017-12-08 ENCOUNTER — Encounter (HOSPITAL_COMMUNITY): Payer: Self-pay | Admitting: Emergency Medicine

## 2017-12-08 ENCOUNTER — Ambulatory Visit (HOSPITAL_COMMUNITY)
Admission: EM | Admit: 2017-12-08 | Discharge: 2017-12-08 | Disposition: A | Discharge status: Home / Self Care | Payer: Medicaid Other | Attending: Family Medicine | Admitting: Family Medicine

## 2017-12-08 DIAGNOSIS — L0291 Cutaneous abscess, unspecified: Secondary | ICD-10-CM | POA: Diagnosis not present

## 2017-12-08 MED ORDER — DOXYCYCLINE HYCLATE 100 MG PO CAPS: 100.0000 mg | ORAL_CAPSULE | Freq: Two times a day (BID) | ORAL | 0 refills | Status: DC

## 2017-12-08 MED ORDER — FLUCONAZOLE 150 MG PO TABS: 150.0000 mg | ORAL_TABLET | Freq: Every day | ORAL | 0 refills | Status: DC

## 2017-12-08 NOTE — ED Provider Notes (Signed)
[facility]    CSN: [id number] Arrival date & time: 12/08/17  1236     History   Chief Complaint Chief Complaint  Patient presents with  . Abscess    HPI Nicole Browning is a 47 y.o. female.   Patient is a 47 year old female presents with abscess to vaginal area.  This is been present and worsening for 3 days. Denies  any drainage.  She has had increased pain and swelling.  She has a history of recurrent abscesses.  Reports that sometimes she can do warm soaks and the abscesses will open up and drain on their own.  She denies any history of MRSA.  She denies any associated fever, chills, body aches, fatigue.   ROS per HPI      Past Medical History:  Diagnosis Date  . Anemia   . GERD (gastroesophageal reflux disease)   . Perirectal abscess   . Protein-calorie malnutrition ([place]) 02/29/2016  . Tobacco use     Patient Active Problem List   Diagnosis Date Noted  . Protein-calorie malnutrition ([place]) 02/29/2016  . Elevated hemoglobin A1c 02/27/2016  . GERD (gastroesophageal reflux disease)   . Tobacco use   . Anemia of chronic disease   . Prediabetes   . Perirectal abscess 02/24/2016    Past Surgical History:  Procedure Laterality Date  . IRRIGATION AND DEBRIDEMENT ABSCESS N/A 02/24/2016   Procedure: IRRIGATION AND DEBRIDEMENT ABSCESS;  Surgeon: Stark Klein, MD;  Location: [facility];  Service: General;  Laterality: N/A;    OB History   None      Home Medications    Prior to Admission medications   Medication Sig Start Date End Date Taking? Authorizing Provider  acetaminophen (TYLENOL) 325 MG tablet Take 650 mg by mouth every 6 (six) hours as needed.    [provider]  amoxicillin-clavulanate (AUGMENTIN) 875-125 MG tablet Take 1 tablet by mouth every 12 (twelve) hours. Patient not taking: Reported on 12/08/2017 11/04/17   Vanessa Kick, MD  doxycycline (VIBRAMYCIN) 100 MG capsule Take 1 capsule (100 mg total) by mouth 2 (two) times daily.  12/08/17   Loura Halt A, NP  fluconazole (DIFLUCAN) 150 MG tablet Take 1 tablet (150 mg total) by mouth daily. 12/08/17   Loura Halt A, NP  HYDROcodone-acetaminophen (NORCO/VICODIN) 5-325 MG tablet Take 1 tablet by mouth every 6 (six) hours as needed for moderate pain or severe pain. Patient not taking: Reported on 12/08/2017 11/04/17   Vanessa Kick, MD    Family History Family History  Problem Relation Age of Onset  . Diabetes Mother   . COPD Father     Social History Social History   Tobacco Use  . Smoking status: Current Every Day Smoker    Packs/day: 0.33    Years: 28.00    Pack years: 9.24    Types: Cigarettes  . Smokeless tobacco: Never Used  Substance Use Topics  . Alcohol use: Yes    Alcohol/week: 2.0 standard drinks    Types: 2 Glasses of wine per week  . Drug use: No     Allergies   Percocet [oxycodone-acetaminophen] and Valium [diazepam]   Review of Systems Review of Systems   Physical Exam Triage Vital Signs ED Triage Vitals [12/08/17 1320]  Enc Vitals Group     BP (!) 155/101     Pulse Rate 84     Resp 18     Temp 98.2 F (36.8 C)     Temp Source Oral  SpO2 100 %     Weight      Height      Head Circumference      Peak Flow      Pain Score 10     Pain Loc      Pain Edu?      Excl. in [place]?    No data found.  Updated Vital Signs BP (!) 155/101   Pulse 84   Temp 98.2 F (36.8 C) (Oral)   Resp 18   SpO2 100%   Visual Acuity Right Eye Distance:   Left Eye Distance:   Bilateral Distance:    Right Eye Near:   Left Eye Near:    Bilateral Near:     Physical Exam  Constitutional: She is oriented to person, place, and time. She appears well-developed and well-nourished.  Very pleasant. Non toxic or ill appearing.   HENT:  Head: Normocephalic and atraumatic.  Eyes: Conjunctivae are normal.  Neck: Normal range of motion.  Pulmonary/Chest: Effort normal.  Abdominal: Soft.  Musculoskeletal: Normal range of motion.  Neurological:  She is alert and oriented to person, place, and time.  Skin: Skin is warm and dry.  Approx 2 cm by 4 cm abscess lateral to the right labia majora 1 cm central fluctuance and surrounding erythema and induration.  Extends distally Indurated abscess to the right labia majora distally. approx 1 cm by 1 cm.    Psychiatric: She has a normal mood and affect.  Nursing note and vitals reviewed.    UC Treatments / Results  Labs (all labs ordered are listed, but only abnormal results are displayed) Labs Reviewed - No data to display  EKG None  Radiology No results found.  Procedures Incision and Drainage Date/Time: 12/08/2017 2:30 PM Performed by: Orvan July, NP Authorized by: Vanessa Kick, MD   Consent:    Consent obtained:  Verbal   Consent given by:  Patient Location:    Type:  Abscess   Location:  Anogenital   Anogenital location:  [place] Pre-procedure details:    Skin preparation:  Betadine Anesthesia (see MAR for exact dosages):    Anesthesia method:  Local infiltration   Local anesthetic:  Lidocaine 2% w/o epi Procedure type:    Complexity:  Simple Procedure details:    Needle aspiration: no     Incision types:  Single straight   Incision depth:  Subcutaneous   Scalpel blade:  11   Wound management:  Probed and deloculated   Drainage:  Bloody and purulent   Drainage amount:  Moderate   Wound treatment:  Wound left open   Packing materials:  None Post-procedure details:    Patient tolerance of procedure:  Tolerated well, no immediate complications   (including critical care time)  Medications Ordered in UC Medications - No data to display  Initial Impression / Assessment and Plan / UC Course  I have reviewed the triage vital signs and the nursing notes.  Pertinent labs & imaging results that were available during my care of the patient were reviewed by me and considered in my medical decision making (see chart for details).     I&D of  abscess Doxycycline for abx coverage Diflucan for yeast infection Warm soaks to promote more drainage Follow up as needed for continued or worsening symptoms  Final Clinical Impressions(s) / UC Diagnoses   Final diagnoses:  Abscess     Discharge Instructions     We drained your abscess. Doxycycline for antibiotic coverage  Continue the warm soaks with Epson salt Diflucan for yeast infection Follow up as needed for continued or worsening symptoms     ED Prescriptions    Medication Sig Dispense Auth. Provider   doxycycline (VIBRAMYCIN) 100 MG capsule Take 1 capsule (100 mg total) by mouth 2 (two) times daily. 20 capsule Marysol Wellnitz A, NP   fluconazole (DIFLUCAN) 150 MG tablet Take 1 tablet (150 mg total) by mouth daily. 2 tablet Loura Halt A, NP     Controlled Substance Prescriptions [place] Controlled Substance Registry consulted? Not Applicable   Orvan July, NP 12/08/17 1434

## 2017-12-08 NOTE — ED Triage Notes (Signed)
Pt here for vaginal abscess

## 2017-12-08 NOTE — Discharge Instructions (Addendum)
We drained your abscess. Doxycycline for antibiotic coverage Continue the warm soaks with Epson salt Diflucan for yeast infection Follow up as needed for continued or worsening symptoms

## 2017-12-17 ENCOUNTER — Encounter (HOSPITAL_COMMUNITY): Payer: Self-pay | Admitting: Emergency Medicine

## 2017-12-17 ENCOUNTER — Ambulatory Visit (HOSPITAL_COMMUNITY)
Admission: EM | Admit: 2017-12-17 | Discharge: 2017-12-17 | Disposition: A | Discharge status: Home / Self Care | Payer: Medicaid Other | Attending: Family Medicine | Admitting: Family Medicine

## 2017-12-17 DIAGNOSIS — N764 Abscess of vulva: Secondary | ICD-10-CM | POA: Diagnosis not present

## 2017-12-17 MED ORDER — SULFAMETHOXAZOLE-TRIMETHOPRIM 800-160 MG PO TABS: 1.0000 | ORAL_TABLET | Freq: Two times a day (BID) | ORAL | 0 refills | Status: AC

## 2017-12-17 NOTE — ED Provider Notes (Signed)
[facility]   [id number] 12/17/17 Arrival Time: 1324  ASSESSMENT & PLAN:  1. Labial abscess   R inferior.  Incision and Drainage Procedure Note  Anesthesia: 2% plain lidocaine  Procedure Details  The procedure, risks and complications have been discussed in detail (including, but not limited to pain and bleeding) with the patient.  The skin induration was prepped and draped in the usual fashion. After adequate local anesthesia, I&D with a #11 blade was performed on the R inferior labia. Purulent drainage: present.  EBL: minimal  Drains: none  Condition: Tolerated procedure well  Complications: none.  Meds ordered this encounter  Medications  . sulfamethoxazole-trimethoprim (BACTRIM DS,SEPTRA DS) 800-160 MG tablet    Sig: Take 1 tablet by mouth 2 (two) times daily for 10 days.    Dispense:  20 tablet    Refill:  0   Stop doxycycline and begin Bactrim. Finish all antibiotics. OTC analgesics as needed.  Follow-up Information    [facility].   Specialty:  Urgent Care Why:  For wound re-check if needed or desired. Contact information: [place] [phone]         If these continue to recur, I recommend she see a Psychologist, sport and exercise.  Reviewed expectations re: course of current medical issues. Questions answered. Outlined signs and symptoms indicating need for more acute intervention. Patient verbalized understanding. After Visit Summary given.   SUBJECTIVE:  Nicole Browning is a 47 y.o. female who presents with a possible abscess of her R labia. Onset gradual, approximately several days ago. Seen here approx 1.5 weeks ago for the same; drained. Seemed to resolve. Moderate discomfort. No bleeding or drainage. Afebrile. Does not shave this area. No specific aggravating or alleviating factors reported. No OTC treatment.  ROS: As per HPI.  OBJECTIVE:  Vitals:   12/17/17 1004  BP:  122/75  Pulse: 82  Resp: 16  Temp: 98.8 F (37.1 C)  SpO2: 100%     General appearance: alert; no distress Skin: 1.5 cm induration of her R inferior labia; tender to touch; no active drainage or bleeding Psychological: alert and cooperative; normal mood and affect  Allergies  Allergen Reactions  . Percocet [Oxycodone-Acetaminophen] Nausea Only  . Valium [Diazepam] Nausea Only    Past Medical History:  Diagnosis Date  . Anemia   . GERD (gastroesophageal reflux disease)   . Perirectal abscess   . Protein-calorie malnutrition ([facility]) 02/29/2016  . Tobacco use    Social History   Socioeconomic History  . Marital status: Single    Spouse name: Not on file  . Number of children: Not on file  . Years of education: Not on file  . Highest education level: Not on file  Occupational History  . Not on file  Social Needs  . Financial resource strain: Not on file  . Food insecurity:    Worry: Not on file    Inability: Not on file  . Transportation needs:    Medical: Not on file    Non-medical: Not on file  Tobacco Use  . Smoking status: Current Every Day Smoker    Packs/day: 0.33    Years: 28.00    Pack years: 9.24    Types: Cigarettes  . Smokeless tobacco: Never Used  Substance and Sexual Activity  . Alcohol use: Yes    Alcohol/week: 2.0 standard drinks    Types: 2 Glasses of wine per week  . Drug use: No  .  Sexual activity: Not Currently  Lifestyle  . Physical activity:    Days per week: Not on file    Minutes per session: Not on file  . Stress: Not on file  Relationships  . Social connections:    Talks on phone: Not on file    Gets together: Not on file    Attends religious service: Not on file    Active member of club or organization: Not on file    Attends meetings of clubs or organizations: Not on file    Relationship status: Not on file  Other Topics Concern  . Not on file  Social History Narrative  . Not on file   Family History  Problem Relation  Age of Onset  . Diabetes Mother   . COPD Father    Past Surgical History:  Procedure Laterality Date  . IRRIGATION AND DEBRIDEMENT ABSCESS N/A 02/24/2016   Procedure: IRRIGATION AND DEBRIDEMENT ABSCESS;  Surgeon: Stark Klein, MD;  Location: [facility];  Service: General;  Laterality: N/AVanessa Kick, MD 12/17/17 1046

## 2017-12-17 NOTE — ED Triage Notes (Signed)
Pt states she gets abscess on her vaginal area and was seen a week ago for same without relief. Given antibiotics without relief.

## 2018-01-12 ENCOUNTER — Ambulatory Visit (HOSPITAL_COMMUNITY)
Admission: EM | Admit: 2018-01-12 | Discharge: 2018-01-12 | Disposition: A | Discharge status: Home / Self Care | Payer: Medicaid Other | Attending: Family Medicine | Admitting: Family Medicine

## 2018-01-12 ENCOUNTER — Encounter (HOSPITAL_COMMUNITY): Payer: Self-pay

## 2018-01-12 DIAGNOSIS — T7840XA Allergy, unspecified, initial encounter: Secondary | ICD-10-CM | POA: Diagnosis not present

## 2018-01-12 DIAGNOSIS — Z882 Allergy status to sulfonamides status: Secondary | ICD-10-CM | POA: Diagnosis not present

## 2018-01-12 DIAGNOSIS — L509 Urticaria, unspecified: Secondary | ICD-10-CM | POA: Diagnosis not present

## 2018-01-12 MED ORDER — DIPHENHYDRAMINE HCL 50 MG/ML IJ SOLN
INTRAMUSCULAR | Status: AC
  Filled 2018-01-12: qty 1

## 2018-01-12 MED ORDER — METHYLPREDNISOLONE 4 MG PO TBPK: ORAL_TABLET | ORAL | 0 refills | Status: DC

## 2018-01-12 MED ORDER — DIPHENHYDRAMINE HCL 50 MG/ML IJ SOLN
25.0000 mg | Freq: Once | INTRAMUSCULAR | Status: AC
  Administered 2018-01-12: 25 mg via INTRAMUSCULAR

## 2018-01-12 MED ORDER — METHYLPREDNISOLONE SODIUM SUCC 125 MG IJ SOLR
INTRAMUSCULAR | Status: AC
  Filled 2018-01-12: qty 2

## 2018-01-12 MED ORDER — METHYLPREDNISOLONE SODIUM SUCC 125 MG IJ SOLR
125.0000 mg | Freq: Once | INTRAMUSCULAR | Status: AC
  Administered 2018-01-12: 125 mg via INTRAMUSCULAR

## 2018-01-12 NOTE — ED Triage Notes (Signed)
Pt presents with allergic reaction to unknown source; she believes it may be to her antibiotics but is unsure.

## 2018-01-12 NOTE — ED Provider Notes (Signed)
[facility]    CSN: [id number] Arrival date & time: 01/12/18  1312     History   Chief Complaint Chief Complaint  Patient presents with  . Allergic Reaction    HPI Nicole Browning is a 47 y.o. female.   HPI Patient was seen last month for a labial abscess.  She was given Septra as an antibiotic.  She states she took a couple days of this, and felt much better.  She did not take the full course.  Today, she felt like the infection might be coming back.  She took this after this morning.  Within an hour she broke out in a rash all over head to toe that itches terribly.  She is never had this reaction to antibiotics before.  She does not recall whether she is ever taken a sulfa antibiotic in prior years.  She did not have any difficulty with the sulfa antibiotic last month when she took it.  No trouble breathing. Past Medical History:  Diagnosis Date  . Anemia   . GERD (gastroesophageal reflux disease)   . Perirectal abscess   . Protein-calorie malnutrition ([facility]) 02/29/2016  . Tobacco use     Patient Active Problem List   Diagnosis Date Noted  . Protein-calorie malnutrition ([facility] [place]) 02/29/2016  . Elevated hemoglobin A1c 02/27/2016  . GERD (gastroesophageal reflux disease)   . Tobacco use   . Anemia of chronic disease   . Prediabetes   . Perirectal abscess 02/24/2016    Past Surgical History:  Procedure Laterality Date  . IRRIGATION AND DEBRIDEMENT ABSCESS N/A 02/24/2016   Procedure: IRRIGATION AND DEBRIDEMENT ABSCESS;  Surgeon: Stark Klein, MD;  Location: [facility];  Service: General;  Laterality: N/A;    OB History   None      Home Medications    Prior to Admission medications   Medication Sig Start Date End Date Taking? Authorizing Provider  acetaminophen (TYLENOL) 325 MG tablet Take 650 mg by mouth every 6 (six) hours as needed.    [provider]  fluconazole (DIFLUCAN) 150 MG tablet Take 1 tablet (150 mg total) by mouth daily. 12/08/17   Orvan July, NP  methylPREDNISolone (MEDROL DOSEPAK) 4 MG TBPK tablet tad 01/12/18   Raylene Everts, MD    Family History Family History  Problem Relation Age of Onset  . Diabetes Mother   . COPD Father     Social History Social History   Tobacco Use  . Smoking status: Current Every Day Smoker    Packs/day: 0.33    Years: 28.00    Pack years: 9.24    Types: Cigarettes  . Smokeless tobacco: Never Used  Substance Use Topics  . Alcohol use: Yes    Alcohol/week: 2.0 standard drinks    Types: 2 Glasses of wine per week  . Drug use: No     Allergies   Bactrim [sulfamethoxazole-trimethoprim]; Percocet [oxycodone-acetaminophen]; and Valium [diazepam]   Review of Systems Review of Systems  Constitutional: Negative for chills and fever.  HENT: Negative for ear pain and sore throat.   Eyes: Negative for pain and visual disturbance.  Respiratory: Negative for cough and shortness of breath.   Cardiovascular: Negative for chest pain and palpitations.  Gastrointestinal: Negative for abdominal pain and vomiting.  Genitourinary: Negative for dysuria and hematuria.  Musculoskeletal: Negative for arthralgias and back pain.  Skin: Positive for rash. Negative for color change.  Neurological: Negative for seizures and syncope.  All other systems reviewed and  are negative.    Physical Exam Triage Vital Signs ED Triage Vitals  Enc Vitals Group     BP 01/12/18 1325 (!) 87/60     Pulse Rate 01/12/18 1325 (!) 140     Resp 01/12/18 1325 (!) 22     Temp 01/12/18 1325 (!) 97.3 F (36.3 C)     Temp Source 01/12/18 1325 Oral     SpO2 01/12/18 1325 98 %     Weight --      Height --      Head Circumference --      Peak Flow --      Pain Score 01/12/18 1328 0     Pain Loc --      Pain Edu? --      Excl. in [place]? --    No data found.  Updated Vital Signs BP (!) 87/60 (BP Location: Left Arm)   Pulse (!) 140   Temp (!) 97.3 F (36.3 C) (Oral)   Resp (!) 22   SpO2 98%  Patient is  quite anxious and agitated because of her rash.  She is scratching, tearful Physical Exam  Constitutional: She appears well-developed and well-nourished. No distress.  HENT:  Head: Normocephalic and atraumatic.  Mouth/Throat: Oropharynx is clear and moist.  Eyes: Pupils are equal, round, and reactive to light. Conjunctivae are normal.  Neck: Normal range of motion.  Cardiovascular: Normal rate, regular rhythm and normal heart sounds.  Pulmonary/Chest: Effort normal and breath sounds normal. No respiratory distress.  Abdominal: Soft. She exhibits no distension.  Musculoskeletal: Normal range of motion. She exhibits no edema.  Neurological: She is alert.  Skin: Skin is warm and dry.  Small urticarial wheals are seen on her exposed skin, forearms, neck, chest and back, face.  Lungs are clear.  Oropharynx is benign.  Psychiatric: She has a normal mood and affect. Her behavior is normal.   After injections with Benadryl and Solu-Medrol, the patient calm down significantly.  The itching and rash went away.  UC Treatments / Results  Labs (all labs ordered are listed, but only abnormal results are displayed) Labs Reviewed - No data to display  EKG None  Radiology No results found.  Procedures Procedures (including critical care time)  Medications Ordered in UC Medications  methylPREDNISolone sodium succinate (SOLU-MEDROL) 125 mg/2 mL injection 125 mg (125 mg Intramuscular Given 01/12/18 1333)  diphenhydrAMINE (BENADRYL) injection 25 mg (25 mg Intramuscular Given 01/12/18 1333)    Initial Impression / Assessment and Plan / UC Course  I have reviewed the triage vital signs and the nursing notes.  Pertinent labs & imaging results that were available during my care of the patient were reviewed by me and considered in my medical decision making (see chart for details).       Final Clinical Impressions(s) / UC Diagnoses   Final diagnoses:  Urticaria  Allergic reaction, initial  encounter     Discharge Instructions     DO NOT TAKE SULFA ANTIBIOTICS ( bactrim, septra ) Take benadryl every 4 hours for the itching Take the medrol pak as directed Take all of day one today   ED Prescriptions    Medication Sig Dispense Auth. Provider   methylPREDNISolone (MEDROL DOSEPAK) 4 MG TBPK tablet tad 21 tablet Raylene Everts, MD     Controlled Substance Prescriptions [place] Controlled Substance Registry consulted? Not Applicable   Raylene Everts, MD 01/12/18 1659

## 2018-01-12 NOTE — Discharge Instructions (Signed)
DO NOT TAKE SULFA ANTIBIOTICS ( bactrim, septra ) Take benadryl every 4 hours for the itching Take the medrol pak as directed Take all of day one today

## 2018-02-04 ENCOUNTER — Other Ambulatory Visit: Payer: Self-pay

## 2018-02-04 ENCOUNTER — Ambulatory Visit (HOSPITAL_COMMUNITY)
Admission: EM | Admit: 2018-02-04 | Discharge: 2018-02-04 | Disposition: A | Discharge status: Home / Self Care | Payer: Medicaid Other | Attending: Family Medicine | Admitting: Family Medicine

## 2018-02-04 ENCOUNTER — Encounter (HOSPITAL_COMMUNITY): Payer: Self-pay | Admitting: Emergency Medicine

## 2018-02-04 DIAGNOSIS — R079 Chest pain, unspecified: Secondary | ICD-10-CM

## 2018-02-04 NOTE — ED Triage Notes (Signed)
Patient was asleep in lobby when name called multiple times.    Patient reports chest tightness for 2 days.  Denies cough.  Patient reports pain intermittent.  No sob.

## 2018-02-04 NOTE — ED Provider Notes (Signed)
[place]    CSN: [id number] Arrival date & time: 02/04/18  1815     History   Chief Complaint Chief Complaint  Patient presents with  . Chest Pain    HPI Nicole Browning is a 47 y.o. female.   She is presenting with centralized sharp nonradiating chest pain.  She has been feeling this pain for the past couple of days.  The pain is worse after coughing or going up stairs.  She denies any diaphoresis, nausea, or radiation to the shoulder.  She denies any history of similar pain.  She has not tried any new or medications for the pain.  She does smoke on a daily basis.  HPI  Past Medical History:  Diagnosis Date  . Anemia   . GERD (gastroesophageal reflux disease)   . Perirectal abscess   . Protein-calorie malnutrition ([place]) 02/29/2016  . Tobacco use     Patient Active Problem List   Diagnosis Date Noted  . Protein-calorie malnutrition ([place]) 02/29/2016  . Elevated hemoglobin A1c 02/27/2016  . GERD (gastroesophageal reflux disease)   . Tobacco use   . Anemia of chronic disease   . Prediabetes   . Perirectal abscess 02/24/2016    Past Surgical History:  Procedure Laterality Date  . IRRIGATION AND DEBRIDEMENT ABSCESS N/A 02/24/2016   Procedure: IRRIGATION AND DEBRIDEMENT ABSCESS;  Surgeon: Stark Klein, MD;  Location: [facility];  Service: General;  Laterality: N/A;    OB History   None      Home Medications    Prior to Admission medications   Medication Sig Start Date End Date Taking? Authorizing Provider  acetaminophen (TYLENOL) 325 MG tablet Take 650 mg by mouth every 6 (six) hours as needed.    [provider]  fluconazole (DIFLUCAN) 150 MG tablet Take 1 tablet (150 mg total) by mouth daily. 12/08/17   Orvan July, NP  methylPREDNISolone (MEDROL DOSEPAK) 4 MG TBPK tablet tad 01/12/18   Raylene Everts, MD    Family History Family History  Problem Relation Age of Onset  . Diabetes Mother   . COPD Father     Social History Social  History   Tobacco Use  . Smoking status: Current Every Day Smoker    Packs/day: 0.33    Years: 28.00    Pack years: 9.24    Types: Cigarettes  . Smokeless tobacco: Never Used  Substance Use Topics  . Alcohol use: Yes    Alcohol/week: 2.0 standard drinks    Types: 2 Glasses of wine per week  . Drug use: No     Allergies   Bactrim [sulfamethoxazole-trimethoprim]; Percocet [oxycodone-acetaminophen]; and Valium [diazepam]   Review of Systems Review of Systems  Constitutional: Negative for fever.  HENT: Negative for congestion.   Respiratory: Positive for cough.   Cardiovascular: Positive for chest pain.  Gastrointestinal: Negative for abdominal distention.  Musculoskeletal: Negative for back pain.  Skin: Negative for color change.  Neurological: Negative for weakness.  Hematological: Negative for adenopathy.  Psychiatric/Behavioral: Negative for agitation.     Physical Exam Triage Vital Signs ED Triage Vitals [02/04/18 1913]  Enc Vitals Group     BP      Pulse      Resp      Temp      Temp src      SpO2      Weight      Height      Head Circumference      Peak Flow  Pain Score 8     Pain Loc      Pain Edu?      Excl. in [place]?    No data found.  Updated Vital Signs There were no vitals taken for this visit.  Visual Acuity Right Eye Distance:   Left Eye Distance:   Bilateral Distance:    Right Eye Near:   Left Eye Near:    Bilateral Near:     Physical Exam Gen: NAD, alert, cooperative with exam,  ENT: normal lips, normal nasal mucosa,  Eye: normal EOM, normal conjunctiva and lids CV:  no edema, +2 pedal pulses   Resp: no accessory muscle use, non-labored, clear to auscultation bilaterally Skin: no rashes, no areas of induration  Neuro: normal tone, normal sensation to touch Psych:  normal insight, alert and oriented MSK: TTP of the sternum   EKG interpretation: sinus rhythm   UC Treatments / Results  Labs (all labs ordered are listed,  but only abnormal results are displayed) Labs Reviewed - No data to display  EKG None  Radiology No results found.  Procedures Procedures (including critical care time)  Medications Ordered in UC Medications - No data to display  Initial Impression / Assessment and Plan / UC Course  I have reviewed the triage vital signs and the nursing notes.  Pertinent labs & imaging results that were available during my care of the patient were reviewed by me and considered in my medical decision making (see chart for details).     Neoma Laming is a 47 year old female is presenting with sternal chest pain.  She reports the pain is been acute in nature.  She denies any associated symptoms such as diaphoresis, nausea, radiation to her left shoulder.  Possible for atypical chest pain.  EKG is reassuring and being normal sinus rhythm.  Denies any history of similar pain.  It is intermittent and sharp and located centrally over the sternum.  Possibly related to reflux.  She is an active smoker.  She can try taking antireflux medication.  Right indications to seek immediate care and be seen the emergency department.  Also provide indications to follow-up with her primary doctor.  Final Clinical Impressions(s) / UC Diagnoses   Final diagnoses:  Chest pain, unspecified type     Discharge Instructions     Please try the anti-reflux medicine  Please be seen in the emergency room if your symptoms don't seem to improve.  Please try to establish care with a primary doctor if your symptoms do improve.     ED Prescriptions    None     Controlled Substance Prescriptions [place] Controlled Substance Registry consulted? Not Applicable   Rosemarie Ax, MD 02/04/18 2053

## 2018-02-04 NOTE — Discharge Instructions (Addendum)
Please try the anti-reflux medicine  Please be seen in the emergency room if your symptoms don't seem to improve.  Please try to establish care with a primary doctor if your symptoms do improve.

## 2019-01-07 ENCOUNTER — Telehealth (HOSPITAL_COMMUNITY): Payer: Self-pay

## 2019-01-07 NOTE — Telephone Encounter (Signed)
Telephoned patient at home number. Left message to call and schedule with [facility].

## 2019-03-03 ENCOUNTER — Ambulatory Visit (HOSPITAL_COMMUNITY)
Admission: EM | Admit: 2019-03-03 | Discharge: 2019-03-03 | Disposition: A | Discharge status: Home / Self Care | Payer: Medicaid Other | Attending: Internal Medicine | Admitting: Internal Medicine

## 2019-03-03 ENCOUNTER — Encounter (HOSPITAL_COMMUNITY): Payer: Self-pay | Admitting: Emergency Medicine

## 2019-03-03 ENCOUNTER — Other Ambulatory Visit: Payer: Self-pay

## 2019-03-03 DIAGNOSIS — Z20828 Contact with and (suspected) exposure to other viral communicable diseases: Secondary | ICD-10-CM

## 2019-03-03 DIAGNOSIS — Z20822 Contact with and (suspected) exposure to covid-19: Secondary | ICD-10-CM

## 2019-03-03 NOTE — Discharge Instructions (Signed)
Person Under Monitoring Name: Nicole Browning  Location: [address] [place] [address]   Infection Prevention Recommendations for Individuals Confirmed to have, or Being Evaluated for, 2019 Novel Coronavirus (COVID-19) Infection Who Receive Care at Home  Individuals who are confirmed to have, or are being evaluated for, COVID-19 should follow the prevention steps below until a healthcare provider or local or state health department says they can return to normal activities.  Stay home except to get medical care You should restrict activities outside your home, except for getting medical care. Do not go to work, school, or public areas, and do not use public transportation or taxis.  Call ahead before visiting your doctor Before your medical appointment, call the healthcare provider and tell them that you have, or are being evaluated for, COVID-19 infection. This will help the healthcare provider's office take steps to keep other people from getting infected. Ask your healthcare provider to call the local or state health department.  Monitor your symptoms Seek prompt medical attention if your illness is worsening (e.g., difficulty breathing). Before going to your medical appointment, call the healthcare provider and tell them that you have, or are being evaluated for, COVID-19 infection. Ask your healthcare provider to call the local or state health department.  Wear a facemask You should wear a facemask that covers your nose and mouth when you are in the same room with other people and when you visit a healthcare provider. People who live with or visit you should also wear a facemask while they are in the same room with you.  Separate yourself from other people in your home As much as possible, you should stay in a different room from other people in your home. Also, you should use a separate bathroom, if available.  Avoid sharing household items You should not share  dishes, drinking glasses, cups, eating utensils, towels, bedding, or other items with other people in your home. After using these items, you should wash them thoroughly with soap and water.  Cover your coughs and sneezes Cover your mouth and nose with a tissue when you cough or sneeze, or you can cough or sneeze into your sleeve. Throw used tissues in a lined trash can, and immediately wash your hands with soap and water for at least 20 seconds or use an alcohol-based hand rub.  Wash your [facility] your hands often and thoroughly with soap and water for at least 20 seconds. You can use an alcohol-based hand sanitizer if soap and water are not available and if your hands are not visibly dirty. Avoid touching your eyes, nose, and mouth with unwashed hands.   Prevention Steps for Caregivers and Household Members of Individuals Confirmed to have, or Being Evaluated for, COVID-19 Infection Being Cared for in the Home  If you live with, or provide care at home for, a person confirmed to have, or being evaluated for, COVID-19 infection please follow these guidelines to prevent infection:  Follow healthcare provider's instructions Make sure that you understand and can help the patient follow any healthcare provider instructions for all care.  Provide for the patient's basic needs You should help the patient with basic needs in the home and provide support for getting groceries, prescriptions, and other personal needs.  Monitor the patient's symptoms If they are getting sicker, call his or her medical provider and tell them that the patient has, or is being evaluated for, COVID-19 infection. This will help the healthcare provider's office take  steps to keep other people from getting infected. Ask the healthcare provider to call the local or state health department.  Limit the number of people who have contact with the patient If possible, have only one caregiver for the patient. Other  household members should stay in another home or place of residence. If this is not possible, they should stay in another room, or be separated from the patient as much as possible. Use a separate bathroom, if available. Restrict visitors who do not have an essential need to be in the home.  Keep older adults, very young children, and other sick people away from the patient Keep older adults, very young children, and those who have compromised immune systems or chronic health conditions away from the patient. This includes people with chronic heart, lung, or kidney conditions, diabetes, and cancer.  Ensure good ventilation Make sure that shared spaces in the home have good air flow, such as from an air conditioner or an opened window, weather permitting.  Wash your hands often Wash your hands often and thoroughly with soap and water for at least 20 seconds. You can use an alcohol based hand sanitizer if soap and water are not available and if your hands are not visibly dirty. Avoid touching your eyes, nose, and mouth with unwashed hands. Use disposable paper towels to dry your hands. If not available, use dedicated cloth towels and replace them when they become wet.  Wear a facemask and gloves Wear a disposable facemask at all times in the room and gloves when you touch or have contact with the patient's blood, body fluids, and/or secretions or excretions, such as sweat, saliva, sputum, nasal mucus, vomit, urine, or feces.  Ensure the mask fits over your nose and mouth tightly, and do not touch it during use. Throw out disposable facemasks and gloves after using them. Do not reuse. Wash your hands immediately after removing your facemask and gloves. If your personal clothing becomes contaminated, carefully remove clothing and launder. Wash your hands after handling contaminated clothing. Place all used disposable facemasks, gloves, and other waste in a lined container before disposing them with  other household waste. Remove gloves and wash your hands immediately after handling these items.  Do not share dishes, glasses, or other household items with the patient Avoid sharing household items. You should not share dishes, drinking glasses, cups, eating utensils, towels, bedding, or other items with a patient who is confirmed to have, or being evaluated for, COVID-19 infection. After the person uses these items, you should wash them thoroughly with soap and water.  Wash laundry thoroughly Immediately remove and wash clothes or bedding that have blood, body fluids, and/or secretions or excretions, such as sweat, saliva, sputum, nasal mucus, vomit, urine, or feces, on them. Wear gloves when handling laundry from the patient. Read and follow directions on labels of laundry or clothing items and detergent. In general, wash and dry with the warmest temperatures recommended on the label.  Clean all areas the individual has used often Clean all touchable surfaces, such as counters, tabletops, doorknobs, bathroom fixtures, toilets, phones, keyboards, tablets, and bedside tables, every day. Also, clean any surfaces that may have blood, body fluids, and/or secretions or excretions on them. Wear gloves when cleaning surfaces the patient has come in contact with. Use a diluted bleach solution (e.g., dilute bleach with 1 part bleach and 10 parts water) or a household disinfectant with a label that says EPA-registered for coronaviruses. To make a bleach solution  at home, add 1 tablespoon of bleach to 1 quart (4 cups) of water. For a larger supply, add  cup of bleach to 1 gallon (16 cups) of water. Read labels of cleaning products and follow recommendations provided on product labels. Labels contain instructions for safe and effective use of the cleaning product including precautions you should take when applying the product, such as wearing gloves or eye protection and making sure you have good ventilation  during use of the product. Remove gloves and wash hands immediately after cleaning.  Monitor yourself for signs and symptoms of illness Caregivers and household members are considered close contacts, should monitor their health, and will be asked to limit movement outside of the home to the extent possible. Follow the monitoring steps for close contacts listed on the symptom monitoring form.   ? If you have additional questions, contact your local health department or call the epidemiologist on call at [phone] (available 24/7). ? This guidance is subject to change. For the most up-to-date guidance from [facility], please refer to their website: [email or web address]

## 2019-03-03 NOTE — ED Triage Notes (Signed)
02/25/2019  Patient was near someone that has tested positive.  Denies having symptoms

## 2019-03-04 LAB — NOVEL CORONAVIRUS, NAA (HOSP ORDER, SEND-OUT TO REF LAB; TAT 18-24 HRS): SARS-CoV-2, NAA: NOT DETECTED

## 2019-03-05 NOTE — ED Provider Notes (Signed)
[facility]    CSN: [id number] Arrival date & time: 03/03/19  1606      History   Chief Complaint Chief Complaint  Patient presents with  . Labs Only    HPI Nicole Browning is a 49 y.o. female history of tobacco use, GERD, presenting today for Covid testing after exposure.  Patient was around someone who tested positive for Covid on 12/25, approximately 6 days ago.  Her and her daughter are here for testing.  Neither has developed any symptoms.  HPI  Past Medical History:  Diagnosis Date  . Anemia   . GERD (gastroesophageal reflux disease)   . Perirectal abscess   . Protein-calorie malnutrition ([place]) 02/29/2016  . Tobacco use     Patient Active Problem List   Diagnosis Date Noted  . Protein-calorie malnutrition ([place]) 02/29/2016  . Elevated hemoglobin A1c 02/27/2016  . GERD (gastroesophageal reflux disease)   . Tobacco use   . Anemia of chronic disease   . Prediabetes   . Perirectal abscess 02/24/2016    Past Surgical History:  Procedure Laterality Date  . IRRIGATION AND DEBRIDEMENT ABSCESS N/A 02/24/2016   Procedure: IRRIGATION AND DEBRIDEMENT ABSCESS;  Surgeon: Stark Klein, MD;  Location: [facility];  Service: General;  Laterality: N/A;    OB History   No obstetric history on file.      Home Medications    Prior to Admission medications   Medication Sig Start Date End Date Taking? Authorizing Provider  acetaminophen (TYLENOL) 325 MG tablet Take 650 mg by mouth every 6 (six) hours as needed.    [provider]    Family History Family History  Problem Relation Age of Onset  . Diabetes Mother   . COPD Father     Social History Social History   Tobacco Use  . Smoking status: Current Every Day Smoker    Packs/day: 0.33    Years: 28.00    Pack years: 9.24    Types: Cigarettes  . Smokeless tobacco: Never Used  Substance Use Topics  . Alcohol use: Yes    Alcohol/week: 2.0 standard drinks    Types: 2 Glasses of wine per week  .  Drug use: No     Allergies   Bactrim [sulfamethoxazole-trimethoprim], Percocet [oxycodone-acetaminophen], and Valium [diazepam]   Review of Systems Review of Systems  Constitutional: Negative for activity change, appetite change, chills, fatigue and fever.  HENT: Negative for congestion, ear pain, rhinorrhea, sinus pressure, sore throat and trouble swallowing.   Eyes: Negative for discharge and redness.  Respiratory: Negative for cough, chest tightness and shortness of breath.   Cardiovascular: Negative for chest pain.  Gastrointestinal: Negative for abdominal pain, diarrhea, nausea and vomiting.  Musculoskeletal: Negative for myalgias.  Skin: Negative for rash.  Neurological: Negative for dizziness, light-headedness and headaches.     Physical Exam Triage Vital Signs ED Triage Vitals  Enc Vitals Group     BP 03/03/19 1705 (!) 102/57     Pulse Rate 03/03/19 1705 91     Resp 03/03/19 1705 18     Temp 03/03/19 1705 99.2 F (37.3 C)     Temp Source 03/03/19 1705 Oral     SpO2 03/03/19 1705 100 %     Weight --      Height --      Head Circumference --      Peak Flow --      Pain Score 03/03/19 1656 0     Pain Loc --  Pain Edu? --      Excl. in [place]? --    No data found.  Updated Vital Signs BP (!) 102/57 (BP Location: Right Arm)   Pulse 91   Temp 99.2 F (37.3 C) (Oral)   Resp 18   SpO2 100%   Visual Acuity Right Eye Distance:   Left Eye Distance:   Bilateral Distance:    Right Eye Near:   Left Eye Near:    Bilateral Near:     Physical Exam Vitals and nursing note reviewed.  Constitutional:      Appearance: She is well-developed.     Comments: No acute distress  HENT:     Head: Normocephalic and atraumatic.     Nose: Nose normal.  Eyes:     Conjunctiva/sclera: Conjunctivae normal.  Cardiovascular:     Rate and Rhythm: Normal rate.  Pulmonary:     Effort: Pulmonary effort is normal. No respiratory distress.     Comments: Breathing comfortably at  rest, CTABL, no wheezing, rales or other adventitious sounds auscultated Abdominal:     General: There is no distension.  Musculoskeletal:        General: Normal range of motion.     Cervical back: Neck supple.  Skin:    General: Skin is warm and dry.  Neurological:     Mental Status: She is alert and oriented to person, place, and time.      UC Treatments / Results  Labs (all labs ordered are listed, but only abnormal results are displayed) Labs Reviewed  NOVEL CORONAVIRUS, NAA (HOSP ORDER, SEND-OUT TO REF LAB; TAT 18-24 HRS)    EKG   Radiology No results found.  Procedures Procedures (including critical care time)  Medications Ordered in UC Medications - No data to display  Initial Impression / Assessment and Plan / UC Course  I have reviewed the triage vital signs and the nursing notes.  Pertinent labs & imaging results that were available during my care of the patient were reviewed by me and considered in my medical decision making (see chart for details).     Covid PCR pending, currently asymptomatic.  Advised to continue to quarantine until results return.  Discussed possible false negatives shortly after exposure and recommended to follow-up if developing any symptoms.  Discussed strict return precautions. Patient verbalized understanding and is agreeable with plan.  Final Clinical Impressions(s) / UC Diagnoses   Final diagnoses:  Close exposure to COVID-19 virus  Encounter for laboratory testing for COVID-19 virus     Discharge Instructions        Person Under Monitoring Name: Nicole Browning  Location: [address] [place] [address]   Infection Prevention Recommendations for Individuals Confirmed to have, or Being Evaluated for, 2019 Novel Coronavirus (COVID-19) Infection Who Receive Care at Home  Individuals who are confirmed to have, or are being evaluated for, COVID-19 should follow the prevention steps below until a healthcare  provider or local or state health department says they can return to normal activities.  Stay home except to get medical care You should restrict activities outside your home, except for getting medical care. Do not go to work, school, or public areas, and do not use public transportation or taxis.  Call ahead before visiting your doctor Before your medical appointment, call the healthcare provider and tell them that you have, or are being evaluated for, COVID-19 infection. This will help the healthcare provider's office take steps to keep other people from getting infected.  Ask your healthcare provider to call the local or state health department.  Monitor your symptoms Seek prompt medical attention if your illness is worsening (e.g., difficulty breathing). Before going to your medical appointment, call the healthcare provider and tell them that you have, or are being evaluated for, COVID-19 infection. Ask your healthcare provider to call the local or state health department.  Wear a facemask You should wear a facemask that covers your nose and mouth when you are in the same room with other people and when you visit a healthcare provider. People who live with or visit you should also wear a facemask while they are in the same room with you.  Separate yourself from other people in your home As much as possible, you should stay in a different room from other people in your home. Also, you should use a separate bathroom, if available.  Avoid sharing household items You should not share dishes, drinking glasses, cups, eating utensils, towels, bedding, or other items with other people in your home. After using these items, you should wash them thoroughly with soap and water.  Cover your coughs and sneezes Cover your mouth and nose with a tissue when you cough or sneeze, or you can cough or sneeze into your sleeve. Throw used tissues in a lined trash can, and immediately wash your hands  with soap and water for at least 20 seconds or use an alcohol-based hand rub.  Wash your [facility] your hands often and thoroughly with soap and water for at least 20 seconds. You can use an alcohol-based hand sanitizer if soap and water are not available and if your hands are not visibly dirty. Avoid touching your eyes, nose, and mouth with unwashed hands.   Prevention Steps for Caregivers and Household Members of Individuals Confirmed to have, or Being Evaluated for, COVID-19 Infection Being Cared for in the Home  If you live with, or provide care at home for, a person confirmed to have, or being evaluated for, COVID-19 infection please follow these guidelines to prevent infection:  Follow healthcare provider's instructions Make sure that you understand and can help the patient follow any healthcare provider instructions for all care.  Provide for the patient's basic needs You should help the patient with basic needs in the home and provide support for getting groceries, prescriptions, and other personal needs.  Monitor the patient's symptoms If they are getting sicker, call his or her medical provider and tell them that the patient has, or is being evaluated for, COVID-19 infection. This will help the healthcare provider's office take steps to keep other people from getting infected. Ask the healthcare provider to call the local or state health department.  Limit the number of people who have contact with the patient  If possible, have only one caregiver for the patient.  Other household members should stay in another home or place of residence. If this is not possible, they should stay  in another room, or be separated from the patient as much as possible. Use a separate bathroom, if available.  Restrict visitors who do not have an essential need to be in the home.  Keep older adults, very young children, and other sick people away from the patient Keep older adults, very  young children, and those who have compromised immune systems or chronic health conditions away from the patient. This includes people with chronic heart, lung, or kidney conditions, diabetes, and cancer.  Ensure good ventilation Make sure that shared spaces  in the home have good air flow, such as from an air conditioner or an opened window, weather permitting.  Wash your hands often  Wash your hands often and thoroughly with soap and water for at least 20 seconds. You can use an alcohol based hand sanitizer if soap and water are not available and if your hands are not visibly dirty.  Avoid touching your eyes, nose, and mouth with unwashed hands.  Use disposable paper towels to dry your hands. If not available, use dedicated cloth towels and replace them when they become wet.  Wear a facemask and gloves  Wear a disposable facemask at all times in the room and gloves when you touch or have contact with the patient's blood, body fluids, and/or secretions or excretions, such as sweat, saliva, sputum, nasal mucus, vomit, urine, or feces.  Ensure the mask fits over your nose and mouth tightly, and do not touch it during use.  Throw out disposable facemasks and gloves after using them. Do not reuse.  Wash your hands immediately after removing your facemask and gloves.  If your personal clothing becomes contaminated, carefully remove clothing and launder. Wash your hands after handling contaminated clothing.  Place all used disposable facemasks, gloves, and other waste in a lined container before disposing them with other household waste.  Remove gloves and wash your hands immediately after handling these items.  Do not share dishes, glasses, or other household items with the patient  Avoid sharing household items. You should not share dishes, drinking glasses, cups, eating utensils, towels, bedding, or other items with a patient who is confirmed to have, or being evaluated for, COVID-19  infection.  After the person uses these items, you should wash them thoroughly with soap and water.  Wash laundry thoroughly  Immediately remove and wash clothes or bedding that have blood, body fluids, and/or secretions or excretions, such as sweat, saliva, sputum, nasal mucus, vomit, urine, or feces, on them.  Wear gloves when handling laundry from the patient.  Read and follow directions on labels of laundry or clothing items and detergent. In general, wash and dry with the warmest temperatures recommended on the label.  Clean all areas the individual has used often  Clean all touchable surfaces, such as counters, tabletops, doorknobs, bathroom fixtures, toilets, phones, keyboards, tablets, and bedside tables, every day. Also, clean any surfaces that may have blood, body fluids, and/or secretions or excretions on them.  Wear gloves when cleaning surfaces the patient has come in contact with.  Use a diluted bleach solution (e.g., dilute bleach with 1 part bleach and 10 parts water) or a household disinfectant with a label that says EPA-registered for coronaviruses. To make a bleach solution at home, add 1 tablespoon of bleach to 1 quart (4 cups) of water. For a larger supply, add  cup of bleach to 1 gallon (16 cups) of water.  Read labels of cleaning products and follow recommendations provided on product labels. Labels contain instructions for safe and effective use of the cleaning product including precautions you should take when applying the product, such as wearing gloves or eye protection and making sure you have good ventilation during use of the product.  Remove gloves and wash hands immediately after cleaning.  Monitor yourself for signs and symptoms of illness Caregivers and household members are considered close contacts, should monitor their health, and will be asked to limit movement outside of the home to the extent possible. Follow the monitoring steps for close contacts  listed on the symptom monitoring form.   ? If you have additional questions, contact your local health department or call the epidemiologist on call at [phone] (available 24/7). ? This guidance is subject to change. For the most up-to-date guidance from [facility], please refer to their website: YouBlogs.pl   ED Prescriptions    None     PDMP not reviewed this encounter.   Janith Lima, PA-C 03/05/19 1234

## 2019-06-23 ENCOUNTER — Other Ambulatory Visit: Payer: Self-pay

## 2019-06-23 ENCOUNTER — Encounter (HOSPITAL_COMMUNITY): Payer: Self-pay

## 2019-06-23 ENCOUNTER — Ambulatory Visit (HOSPITAL_COMMUNITY)
Admission: EM | Admit: 2019-06-23 | Discharge: 2019-06-23 | Disposition: A | Discharge status: Home / Self Care | Payer: Medicaid Other | Attending: Family Medicine | Admitting: Family Medicine

## 2019-06-23 DIAGNOSIS — Z1152 Encounter for screening for COVID-19: Secondary | ICD-10-CM

## 2019-06-23 NOTE — Discharge Instructions (Signed)
We will call you if positive.  Please check your my chart tomorrow.

## 2019-06-23 NOTE — ED Provider Notes (Signed)
[facility]    CSN: [id number] Arrival date & time: 06/23/19  1709      History   Chief Complaint Chief Complaint  Patient presents with  . Covid Test for Travel    HPI Nicole Browning is a 49 y.o. female.   She is here for Covid testing.  She needs this for travel.  She is asymptomatic.  HPI  Past Medical History:  Diagnosis Date  . Anemia   . GERD (gastroesophageal reflux disease)   . Perirectal abscess   . Protein-calorie malnutrition ([place]) 02/29/2016  . Tobacco use     Patient Active Problem List   Diagnosis Date Noted  . Protein-calorie malnutrition ([place]) 02/29/2016  . Elevated hemoglobin A1c 02/27/2016  . GERD (gastroesophageal reflux disease)   . Tobacco use   . Anemia of chronic disease   . Prediabetes   . Perirectal abscess 02/24/2016    Past Surgical History:  Procedure Laterality Date  . IRRIGATION AND DEBRIDEMENT ABSCESS N/A 02/24/2016   Procedure: IRRIGATION AND DEBRIDEMENT ABSCESS;  Surgeon: Stark Klein, MD;  Location: [facility];  Service: General;  Laterality: N/A;    OB History   No obstetric history on file.      Home Medications    Prior to Admission medications   Medication Sig Start Date End Date Taking? Authorizing Provider  acetaminophen (TYLENOL) 325 MG tablet Take 650 mg by mouth every 6 (six) hours as needed.    [provider]    Family History Family History  Problem Relation Age of Onset  . Diabetes Mother   . COPD Father     Social History Social History   Tobacco Use  . Smoking status: Current Every Day Smoker    Packs/day: 0.33    Years: 28.00    Pack years: 9.24    Types: Cigarettes  . Smokeless tobacco: Never Used  Substance Use Topics  . Alcohol use: Yes    Alcohol/week: 2.0 standard drinks    Types: 2 Glasses of wine per week  . Drug use: No     Allergies   Bactrim [sulfamethoxazole-trimethoprim], Percocet [oxycodone-acetaminophen], and Valium [diazepam]   Review of  Systems Review of Systems  See HPI  Physical Exam Triage Vital Signs ED Triage Vitals [06/23/19 1801]  Enc Vitals Group     BP 139/88     Pulse Rate 80     Resp 18     Temp 98.9 F (37.2 C)     Temp Source Oral     SpO2 99 %     Weight      Height      Head Circumference      Peak Flow      Pain Score 0     Pain Loc      Pain Edu?      Excl. in [place]?    No data found.  Updated Vital Signs BP 139/88 (BP Location: Right Arm)   Pulse 80   Temp 98.9 F (37.2 C) (Oral)   Resp 18   SpO2 99%   Visual Acuity Right Eye Distance:   Left Eye Distance:   Bilateral Distance:    Right Eye Near:   Left Eye Near:    Bilateral Near:     Physical Exam Gen: NAD, alert, cooperative with exam, well-appearing    UC Treatments / Results  Labs (all labs ordered are listed, but only abnormal results are displayed) Labs Reviewed  SARS CORONAVIRUS 2 (TAT 6-24  HRS)    EKG   Radiology No results found.  Procedures Procedures (including critical care time)  Medications Ordered in UC Medications - No data to display  Initial Impression / Assessment and Plan / UC Course  I have reviewed the triage vital signs and the nursing notes.  Pertinent labs & imaging results that were available during my care of the patient were reviewed by me and considered in my medical decision making (see chart for details).     Ms. Penton is a 48 year old female is presenting for Covid testing.  She uses for travel.  She is asymptomatic.  She will be contacted if positive.  Final Clinical Impressions(s) / UC Diagnoses   Final diagnoses:  Encounter for screening for COVID-19     Discharge Instructions     We will call you if positive.  Please check your my chart tomorrow.    ED Prescriptions    None     PDMP not reviewed this encounter.   Rosemarie Ax, MD 06/23/19 [phone]

## 2019-06-23 NOTE — ED Triage Notes (Signed)
Pt presents for covid test for travel with no symptoms.

## 2019-06-24 LAB — SARS CORONAVIRUS 2 (TAT 6-24 HRS): SARS Coronavirus 2: NEGATIVE

## 2019-09-21 ENCOUNTER — Ambulatory Visit (HOSPITAL_COMMUNITY)
Admission: EM | Admit: 2019-09-21 | Discharge: 2019-09-21 | Disposition: A | Discharge status: Home / Self Care | Payer: Medicaid Other | Attending: Family Medicine | Admitting: Family Medicine

## 2019-09-21 ENCOUNTER — Other Ambulatory Visit: Payer: Self-pay

## 2019-09-21 ENCOUNTER — Encounter (HOSPITAL_COMMUNITY): Payer: Self-pay

## 2019-09-21 DIAGNOSIS — T50Z95A Adverse effect of other vaccines and biological substances, initial encounter: Secondary | ICD-10-CM | POA: Diagnosis not present

## 2019-09-21 DIAGNOSIS — R110 Nausea: Secondary | ICD-10-CM

## 2019-09-21 MED ORDER — ONDANSETRON 4 MG PO TBDP: 4.0000 mg | ORAL_TABLET | Freq: Three times a day (TID) | ORAL | 0 refills | Status: DC | PRN

## 2019-09-21 NOTE — ED Triage Notes (Addendum)
Pt presents to UC for nausea and arm pain after receiving covid vaccine yesterday. RN informed pt this is expected finding, not adverse reaction. Pt requesting prescription to help with nausea. Pt denies pain, states "I just feel like crap".  Pt has been treating arm pain with tylenol, with minimal relief.

## 2019-09-21 NOTE — ED Provider Notes (Signed)
[facility]   [id number] 09/21/19 Arrival Time: 6333  ASSESSMENT & PLAN:  1. Adverse reaction to vaccine, initial encounter   2. Nausea     Meds ordered this encounter  Medications  . ondansetron (ZOFRAN-ODT) 4 MG disintegrating tablet    Sig: Take 1 tablet (4 mg total) by mouth every 8 (eight) hours as needed for nausea or vomiting.    Dispense:  15 tablet    Refill:  0   Tylenol/Advil if needed.   Follow-up Information    [facility].   Specialty: Urgent Care Why: As needed. Contact information: [place] [phone]              Reviewed expectations re: course of current medical issues. Questions answered. Outlined signs and symptoms indicating need for more acute intervention. Understanding verbalized. After Visit Summary given.   SUBJECTIVE: History from: patient. Nicole Browning is a 49 y.o. female who reports getting COVID vaccine #1 yesterday. Left arm sore now. Intermittent nausea without emesis. Afebrile.    OBJECTIVE:  Vitals:   09/21/19 1726  BP: 133/71  Pulse: 79  Resp: 16  Temp: 98.1 F (36.7 C)  TempSrc: Oral  SpO2: 100%    General appearance: alert; no distress Eyes: PERRLA; EOMI; conjunctiva normal HENT: Grandfather; AT; nasal mucosa normal; oral mucosa normal Neck: supple  Lungs: speaks full sentences without difficulty; unlabored Extremities: no edema; no localized rxn at injection site, L arm Skin: warm and dry Neurologic: normal gait Psychological: alert and cooperative; normal mood and affect   Allergies  Allergen Reactions  . Bactrim [Sulfamethoxazole-Trimethoprim] Hives    Immediate body wide hives  . Percocet [Oxycodone-Acetaminophen] Nausea Only  . Valium [Diazepam] Nausea Only    Past Medical History:  Diagnosis Date  . Anemia   . GERD (gastroesophageal reflux disease)   . Perirectal abscess   . Protein-calorie malnutrition ([place]) 02/29/2016   . Tobacco use    Social History   Socioeconomic History  . Marital status: Single    Spouse name: Not on file  . Number of children: Not on file  . Years of education: Not on file  . Highest education level: Not on file  Occupational History  . Not on file  Tobacco Use  . Smoking status: Current Every Day Smoker    Packs/day: 0.33    Years: 28.00    Pack years: 9.24    Types: Cigarettes  . Smokeless tobacco: Never Used  Substance and Sexual Activity  . Alcohol use: Yes    Alcohol/week: 2.0 standard drinks    Types: 2 Glasses of wine per week  . Drug use: No  . Sexual activity: Not Currently    Birth control/protection: Injection  Other Topics Concern  . Not on file  Social History Narrative  . Not on file   Social Determinants of Health   Financial Resource Strain:   . Difficulty of Paying Living Expenses:   Food Insecurity:   . Worried About Charity fundraiser in the Last Year:   . Arboriculturist in the Last Year:   Transportation Needs:   . Film/video editor (Medical):   Marland Kitchen Lack of Transportation (Non-Medical):   Physical Activity:   . Days of Exercise per Week:   . Minutes of Exercise per Session:   Stress:   . Feeling of Stress :   Social Connections:   . Frequency of Communication with Friends  and Family:   . Frequency of Social Gatherings with Friends and Family:   . Attends Religious Services:   . Active Member of Clubs or Organizations:   . Attends Archivist Meetings:   Marland Kitchen Marital Status:   Intimate Partner Violence:   . Fear of Current or Ex-Partner:   . Emotionally Abused:   Marland Kitchen Physically Abused:   . Sexually Abused:    Family History  Problem Relation Age of Onset  . Diabetes Mother   . COPD Father    Past Surgical History:  Procedure Laterality Date  . IRRIGATION AND DEBRIDEMENT ABSCESS N/A 02/24/2016   Procedure: IRRIGATION AND DEBRIDEMENT ABSCESS;  Surgeon: Stark Klein, MD;  Location: [facility];  Service: General;   Laterality: N/AVanessa Kick, MD 09/21/19 1751

## 2020-01-07 ENCOUNTER — Ambulatory Visit (HOSPITAL_COMMUNITY)
Admission: EM | Admit: 2020-01-07 | Discharge: 2020-01-07 | Disposition: A | Discharge status: Home / Self Care | Payer: Medicaid Other | Attending: Emergency Medicine | Admitting: Emergency Medicine

## 2020-01-07 ENCOUNTER — Other Ambulatory Visit: Payer: Self-pay

## 2020-01-07 DIAGNOSIS — L0291 Cutaneous abscess, unspecified: Secondary | ICD-10-CM

## 2020-01-07 MED ORDER — DOXYCYCLINE HYCLATE 100 MG PO CAPS: 100.0000 mg | ORAL_CAPSULE | Freq: Two times a day (BID) | ORAL | 0 refills | Status: AC

## 2020-01-07 NOTE — Discharge Instructions (Signed)
Take the antibiotic as directed.  Keep your wound clean and dry.  Wash it gently twice a day with soap and water.  Apply an antibiotic cream twice a day.    Return here if you see signs of worsening infection, such as increased pain, redness, warmth, fever, chills, or other concerning symptoms.

## 2020-01-07 NOTE — ED Provider Notes (Signed)
[place]    CSN: [id number] Arrival date & time: 01/07/20  1732      History   Chief Complaint Chief Complaint  Patient presents with  . Recurrent Skin Infections    HPI Nicole Browning is a 49 y.o. female.   Patient presents with an abscess on her left groin x3 days.  She has a history of frequent abscesses.  She denies fever or chills.  No open wounds or drainage.  Treatment attempted at home with Aleve and Epson salt soaks.  Her medical history includes perirectal abscess, anemia, protein calorie malnutrition, GERD, tobacco use, prediabetes.  The history is provided by the patient and medical records.    Past Medical History:  Diagnosis Date  . Anemia   . GERD (gastroesophageal reflux disease)   . Perirectal abscess   . Protein-calorie malnutrition ([place]) 02/29/2016  . Tobacco use     Patient Active Problem List   Diagnosis Date Noted  . Protein-calorie malnutrition ([place]) 02/29/2016  . Elevated hemoglobin A1c 02/27/2016  . GERD (gastroesophageal reflux disease)   . Tobacco use   . Anemia of chronic disease   . Prediabetes   . Perirectal abscess 02/24/2016    Past Surgical History:  Procedure Laterality Date  . IRRIGATION AND DEBRIDEMENT ABSCESS N/A 02/24/2016   Procedure: IRRIGATION AND DEBRIDEMENT ABSCESS;  Surgeon: Stark Klein, MD;  Location: [facility];  Service: General;  Laterality: N/A;    OB History   No obstetric history on file.      Home Medications    Prior to Admission medications   Medication Sig Start Date End Date Taking? Authorizing Provider  acetaminophen (TYLENOL) 325 MG tablet Take 650 mg by mouth every 6 (six) hours as needed.    [provider]  doxycycline (VIBRAMYCIN) 100 MG capsule Take 1 capsule (100 mg total) by mouth 2 (two) times daily for 10 days. 01/07/20 01/17/20  Sharion Balloon, NP  ondansetron (ZOFRAN-ODT) 4 MG disintegrating tablet Take 1 tablet (4 mg total) by mouth every 8 (eight) hours as needed for  nausea or vomiting. 09/21/19   Vanessa Kick, MD    Family History Family History  Problem Relation Age of Onset  . Diabetes Mother   . COPD Father     Social History Social History   Tobacco Use  . Smoking status: Current Every Day Smoker    Packs/day: 0.33    Years: 28.00    Pack years: 9.24    Types: Cigarettes  . Smokeless tobacco: Never Used  Substance Use Topics  . Alcohol use: Yes    Alcohol/week: 2.0 standard drinks    Types: 2 Glasses of wine per week  . Drug use: No     Allergies   Bactrim [sulfamethoxazole-trimethoprim], Percocet [oxycodone-acetaminophen], and Valium [diazepam]   Review of Systems Review of Systems  Constitutional: Negative for chills and fever.  HENT: Negative for ear pain and sore throat.   Eyes: Negative for pain and visual disturbance.  Respiratory: Negative for cough and shortness of breath.   Cardiovascular: Negative for chest pain and palpitations.  Gastrointestinal: Negative for abdominal pain and vomiting.  Genitourinary: Negative for dysuria and hematuria.  Musculoskeletal: Negative for arthralgias and back pain.  Skin: Positive for color change and wound.  Neurological: Negative for seizures and syncope.  All other systems reviewed and are negative.    Physical Exam Triage Vital Signs ED Triage Vitals [01/07/20 1757]  Enc Vitals Group     BP  Pulse      Resp      Temp      Temp src      SpO2      Weight      Height      Head Circumference      Peak Flow      Pain Score 9     Pain Loc      Pain Edu?      Excl. in [place]?    No data found.  Updated Vital Signs BP 106/65 (BP Location: Right Arm)   Pulse (!) 104   Temp 98.7 F (37.1 C) (Oral)   Resp 15   SpO2 100%   Visual Acuity Right Eye Distance:   Left Eye Distance:   Bilateral Distance:    Right Eye Near:   Left Eye Near:    Bilateral Near:     Physical Exam Vitals and nursing note reviewed.  Constitutional:      General: She is not in  acute distress.    Appearance: She is well-developed.  HENT:     Head: Normocephalic and atraumatic.     Mouth/Throat:     Mouth: Mucous membranes are moist.  Eyes:     Conjunctiva/sclera: Conjunctivae normal.  Cardiovascular:     Rate and Rhythm: Normal rate and regular rhythm.     Heart sounds: No murmur heard.   Pulmonary:     Effort: Pulmonary effort is normal. No respiratory distress.     Breath sounds: Normal breath sounds.  Abdominal:     Palpations: Abdomen is soft.     Tenderness: There is no abdominal tenderness.  Genitourinary:   Musculoskeletal:     Cervical back: Neck supple.  Skin:    General: Skin is warm and dry.     Findings: Lesion present.     Comments: Firm, nonfluctuant, tender, erythematous area of induration on left upper labia and groin; 7 x 8 cm. No wounds or drainage.   Neurological:     General: No focal deficit present.     Mental Status: She is alert and oriented to person, place, and time.     Gait: Gait normal.  Psychiatric:        Mood and Affect: Mood normal.        Behavior: Behavior normal.      UC Treatments / Results  Labs (all labs ordered are listed, but only abnormal results are displayed) Labs Reviewed - No data to display  EKG   Radiology No results found.  Procedures Incision and Drainage  Date/Time: 01/07/2020 6:18 PM Performed by: Sharion Balloon, NP Authorized by: Sharion Balloon, NP   Consent:    Consent obtained:  Verbal   Consent given by:  Patient   Risks discussed:  Bleeding, incomplete drainage and infection Location:    Type:  Abscess Pre-procedure details:    Skin preparation:  Betadine Anesthesia (see MAR for exact dosages):    Anesthesia method:  Local infiltration   Local anesthetic:  Lidocaine 1% w/o epi Procedure details:    Incision types:  Single straight   Scalpel blade:  11   Drainage:  Bloody   Drainage amount:  Scant   Wound treatment:  Wound left open   Packing materials:   None Post-procedure details:    Patient tolerance of procedure:  Tolerated well, no immediate complications   (including critical care time)  Medications Ordered in UC Medications - No data to display  Initial  Impression / Assessment and Plan / UC Course  I have reviewed the triage vital signs and the nursing notes.  Pertinent labs & imaging results that were available during my care of the patient were reviewed by me and considered in my medical decision making (see chart for details).   Abscess. I&D attempted but scant bloody return. Treating with doxycycline. Wound care instructions and signs of worsening infection discussed with patient. Discussed with her that the abscess may need draining in the next few days. Instructed patient to follow-up with her PCP or OB/GYN. Patient agrees to plan of care.   Final Clinical Impressions(s) / UC Diagnoses   Final diagnoses:  Abscess     Discharge Instructions     Take the antibiotic as directed.  Keep your wound clean and dry.  Wash it gently twice a day with soap and water.  Apply an antibiotic cream twice a day.    Return here if you see signs of worsening infection, such as increased pain, redness, warmth, fever, chills, or other concerning symptoms.         ED Prescriptions    Medication Sig Dispense Auth. Provider   doxycycline (VIBRAMYCIN) 100 MG capsule Take 1 capsule (100 mg total) by mouth 2 (two) times daily for 10 days. 20 capsule Sharion Balloon, NP     PDMP not reviewed this encounter.   Sharion Balloon, NP 01/07/20 1826

## 2020-01-07 NOTE — ED Triage Notes (Signed)
Pt reports she has a hx of boils. She recently noted a boil located on top of vaginal area, she has been soaking are area with Epson salt and taking aleve for pain. Last dose an hr ago. No drainage noted.   Denies fever, chills, n/v, or diarrhea.

## 2020-01-11 ENCOUNTER — Ambulatory Visit (INDEPENDENT_AMBULATORY_CARE_PROVIDER_SITE_OTHER): Payer: Medicaid Other | Admitting: Primary Care

## 2020-03-10 ENCOUNTER — Other Ambulatory Visit: Payer: Self-pay

## 2020-03-10 ENCOUNTER — Encounter (HOSPITAL_COMMUNITY): Payer: Self-pay | Admitting: Emergency Medicine

## 2020-03-10 ENCOUNTER — Ambulatory Visit (HOSPITAL_COMMUNITY)
Admission: EM | Admit: 2020-03-10 | Discharge: 2020-03-10 | Disposition: A | Discharge status: Home / Self Care | Payer: Medicaid Other | Attending: Urgent Care | Admitting: Urgent Care

## 2020-03-10 DIAGNOSIS — Z20822 Contact with and (suspected) exposure to covid-19: Secondary | ICD-10-CM

## 2020-03-10 DIAGNOSIS — R519 Headache, unspecified: Secondary | ICD-10-CM | POA: Diagnosis present

## 2020-03-10 DIAGNOSIS — B349 Viral infection, unspecified: Secondary | ICD-10-CM

## 2020-03-10 LAB — SARS CORONAVIRUS 2 (TAT 6-24 HRS): SARS Coronavirus 2: NEGATIVE

## 2020-03-10 NOTE — Discharge Instructions (Signed)

## 2020-03-10 NOTE — ED Triage Notes (Signed)
Pt states that her daughter on the 29th of dec. Pt states that she has HA as of now. Pt states that as of last week she had a runny nose, cough, and HA. Pt states that she just has a HA now and needs a covid test to return to work on [employer]

## 2020-04-04 ENCOUNTER — Other Ambulatory Visit: Payer: Self-pay

## 2020-04-04 ENCOUNTER — Ambulatory Visit (INDEPENDENT_AMBULATORY_CARE_PROVIDER_SITE_OTHER): Payer: Medicaid Other | Admitting: Primary Care

## 2020-04-04 ENCOUNTER — Encounter (INDEPENDENT_AMBULATORY_CARE_PROVIDER_SITE_OTHER): Payer: Self-pay | Admitting: Primary Care

## 2020-04-04 VITALS — BP 117/83 | HR 91 | Resp 16 | Ht 66.0 in | Wt 132.0 lb

## 2020-04-04 DIAGNOSIS — Z7689 Persons encountering health services in other specified circumstances: Secondary | ICD-10-CM | POA: Diagnosis not present

## 2020-04-04 DIAGNOSIS — Z1211 Encounter for screening for malignant neoplasm of colon: Secondary | ICD-10-CM

## 2020-04-04 DIAGNOSIS — M79675 Pain in left toe(s): Secondary | ICD-10-CM | POA: Diagnosis not present

## 2020-04-04 DIAGNOSIS — Z833 Family history of diabetes mellitus: Secondary | ICD-10-CM | POA: Diagnosis not present

## 2020-04-04 LAB — POCT GLYCOSYLATED HEMOGLOBIN (HGB A1C): Hemoglobin A1C: 5.5 % (ref 4.0–5.6)

## 2020-04-04 NOTE — Progress Notes (Signed)
New Patient Office Visit  Subjective:  Patient ID: Nicole Browning, female    DOB: [birth year]  Age: 50 y.o. MRN: [id number]  CC: No chief complaint on file.   HPI Ms. Nicole Browning is a thin frame 50 year old female who  presents for establishment of care and left great toe pain with swelling , she states she dropped a object on it. She voices no other complaints or problems   Past Medical History:  Diagnosis Date  . Anemia   . GERD (gastroesophageal reflux disease)   . Perirectal abscess   . Protein-calorie malnutrition ([place]) 02/29/2016  . Tobacco use     Past Surgical History:  Procedure Laterality Date  . IRRIGATION AND DEBRIDEMENT ABSCESS N/A 02/24/2016   Procedure: IRRIGATION AND DEBRIDEMENT ABSCESS;  Surgeon: Stark Klein, MD;  Location: [facility];  Service: General;  Laterality: N/A;    Family History  Problem Relation Age of Onset  . Diabetes Mother   . COPD Father     Social History   Socioeconomic History  . Marital status: Single    Spouse name: Not on file  . Number of children: Not on file  . Years of education: Not on file  . Highest education level: Not on file  Occupational History  . Not on file  Tobacco Use  . Smoking status: Current Every Day Smoker    Packs/day: 0.33    Years: 28.00    Pack years: 9.24    Types: Cigarettes  . Smokeless tobacco: Never Used  Substance and Sexual Activity  . Alcohol use: Yes    Alcohol/week: 2.0 standard drinks    Types: 2 Glasses of wine per week  . Drug use: No  . Sexual activity: Not Currently    Birth control/protection: Injection  Other Topics Concern  . Not on file  Social History Narrative  . Not on file   Social Determinants of Health   Financial Resource Strain: Not on file  Food Insecurity: Not on file  Transportation Needs: Not on file  Physical Activity: Not on file  Stress: Not on file  Social Connections: Not on file  Intimate Partner Violence: Not on file    ROS Review of Systems   Musculoskeletal:       Big toe on left foot pain drop object on it   All other systems reviewed and are negative.   Objective:   Today's Vitals: BP 117/83   Pulse 91   Resp 16   Ht 5' 6"  (1.676 m)   Wt 132 lb (59.9 kg)   SpO2 97%   BMI 21.31 kg/m   Physical Exam Vitals reviewed.  Constitutional:      Appearance: Normal appearance.  HENT:     Head: Normocephalic.     Right Ear: External ear normal.     Left Ear: External ear normal.     Nose: Nose normal.  Eyes:     Extraocular Movements: Extraocular movements intact.  Cardiovascular:     Rate and Rhythm: Normal rate and regular rhythm.  Pulmonary:     Effort: Pulmonary effort is normal.     Breath sounds: Normal breath sounds.  Abdominal:     General: Bowel sounds are normal.     Palpations: Abdomen is soft.  Musculoskeletal:        General: Swelling and tenderness present. Normal range of motion.     Cervical back: Normal range of motion.     Comments: Left great toe injury  Skin:    General: Skin is warm and dry.  Neurological:     Mental Status: She is alert and oriented to person, place, and time.  Psychiatric:        Mood and Affect: Mood normal.        Behavior: Behavior normal.        Thought Content: Thought content normal.        Judgment: Judgment normal.     Assessment & Plan:  Diagnoses and all orders for this visit:  Encounter to establish care Establish care with new PCP  Family history of diabetes mellitus Screening A1C 5.5 not a diabetic per ADA guidelines   Great toe pain, left S/p trauma dropped the end table on her toe- Ice, keep elevate foot and use tylenol or ibuprofen for pain  Colon cancer screening -     Ambulatory referral to Gastroenterology   Outpatient Encounter Medications as of 04/04/2020  Medication Sig  . [DISCONTINUED] acetaminophen (TYLENOL) 325 MG tablet Take 650 mg by mouth every 6 (six) hours as needed. (Patient not taking: Reported on 04/04/2020)  .  [DISCONTINUED] ondansetron (ZOFRAN-ODT) 4 MG disintegrating tablet Take 1 tablet (4 mg total) by mouth every 8 (eight) hours as needed for nausea or vomiting. (Patient not taking: Reported on 04/04/2020)   No facility-administered encounter medications on file as of 04/04/2020.    Follow-up: Return if symptoms worsen or fail to improve.   Kerin Perna, NP

## 2020-04-04 NOTE — Addendum Note (Signed)
Addended by: Carilyn Goodpasture on: 04/04/2020 05:22 PM   Modules accepted: Orders

## 2020-04-20 ENCOUNTER — Encounter: Payer: Self-pay | Admitting: Gastroenterology

## 2020-06-04 ENCOUNTER — Encounter (HOSPITAL_COMMUNITY): Payer: Self-pay

## 2020-06-04 ENCOUNTER — Ambulatory Visit (HOSPITAL_COMMUNITY)
Admission: EM | Admit: 2020-06-04 | Discharge: 2020-06-04 | Disposition: A | Discharge status: Home / Self Care | Payer: Medicaid Other | Attending: Student | Admitting: Student

## 2020-06-04 DIAGNOSIS — L0231 Cutaneous abscess of buttock: Secondary | ICD-10-CM

## 2020-06-04 DIAGNOSIS — L02214 Cutaneous abscess of groin: Secondary | ICD-10-CM

## 2020-06-04 MED ORDER — ACETAMINOPHEN 325 MG PO TABS
ORAL_TABLET | ORAL | Status: AC
  Filled 2020-06-04: qty 2

## 2020-06-04 MED ORDER — ACETAMINOPHEN 325 MG PO TABS
650.0000 mg | ORAL_TABLET | Freq: Once | ORAL | Status: AC
  Administered 2020-06-04: 650 mg via ORAL

## 2020-06-04 MED ORDER — DOXYCYCLINE HYCLATE 100 MG PO CAPS: 100.0000 mg | ORAL_CAPSULE | Freq: Two times a day (BID) | ORAL | 0 refills | Status: AC

## 2020-06-04 NOTE — Discharge Instructions (Addendum)
-  Start the antibiotic-doxycycline (Vibramycin), 2 pills daily for 7 days. -Continue your warm baths at home, this will help remaining pus come out. -Seek additional medical attention if your symptoms persist despite treatment, like new fever/chills, the buttock abscess gets worse and seems like it needs to drain, etc.

## 2020-06-04 NOTE — ED Triage Notes (Signed)
Pt c/o two abscess that have started draining. Pt states there is on one her buttock and vaginal area.

## 2020-06-04 NOTE — ED Provider Notes (Signed)
[place]    CSN: [id number] Arrival date & time: 06/04/20  1531      History   Chief Complaint Chief Complaint  Patient presents with  . Abscess    HPI Nicole Browning is a 50 y.o. female presenting with abscesses.  History of perirectal abscess.  Here today with 20 year old daughter.  Notes 2 abscesses, one is spontaneously draining.  One is on buttocks and one is in vaginal area.  Patient notes vaginal abscess for about 7 days, this started draining on its own 3 days ago.  Has been doing warm baths to encourage this.  States that the left buttock abscess appeared 3 days ago but she does not think this is ready to drain.  Pain is controlled on over-the-counter ibuprofen/Tylenol.  HPI  Past Medical History:  Diagnosis Date  . Anemia   . GERD (gastroesophageal reflux disease)   . Perirectal abscess   . Protein-calorie malnutrition ([place]) 02/29/2016  . Tobacco use     Patient Active Problem List   Diagnosis Date Noted  . Protein-calorie malnutrition ([place]) 02/29/2016  . Elevated hemoglobin A1c 02/27/2016  . GERD (gastroesophageal reflux disease)   . Tobacco use   . Anemia of chronic disease   . Prediabetes   . Perirectal abscess 02/24/2016    Past Surgical History:  Procedure Laterality Date  . IRRIGATION AND DEBRIDEMENT ABSCESS N/A 02/24/2016   Procedure: IRRIGATION AND DEBRIDEMENT ABSCESS;  Surgeon: Stark Klein, MD;  Location: [facility];  Service: General;  Laterality: N/A;    OB History   No obstetric history on file.      Home Medications    Prior to Admission medications   Medication Sig Start Date End Date Taking? Authorizing Provider  doxycycline (VIBRAMYCIN) 100 MG capsule Take 1 capsule (100 mg total) by mouth 2 (two) times daily for 7 days. 06/04/20 06/11/20 Yes Hazel Sams, PA-C    Family History Family History  Problem Relation Age of Onset  . Diabetes Mother   . COPD Father     Social History Social History   Tobacco Use  .  Smoking status: Current Every Day Smoker    Packs/day: 0.33    Years: 28.00    Pack years: 9.24    Types: Cigarettes  . Smokeless tobacco: Never Used  Substance Use Topics  . Alcohol use: Yes    Alcohol/week: 2.0 standard drinks    Types: 2 Glasses of wine per week  . Drug use: No     Allergies   Bactrim [sulfamethoxazole-trimethoprim], Percocet [oxycodone-acetaminophen], and Valium [diazepam]   Review of Systems Review of Systems  Skin:       Multiple abscesses  All other systems reviewed and are negative.    Physical Exam Triage Vital Signs ED Triage Vitals  Enc Vitals Group     BP      Pulse      Resp      Temp      Temp src      SpO2      Weight      Height      Head Circumference      Peak Flow      Pain Score      Pain Loc      Pain Edu?      Excl. in [place]?    No data found.  Updated Vital Signs BP 122/82 (BP Location: Left Arm)   Pulse 94   Temp 98.4 F (36.9 C) (  Oral)   Resp 17   SpO2 97%   Visual Acuity Right Eye Distance:   Left Eye Distance:   Bilateral Distance:    Right Eye Near:   Left Eye Near:    Bilateral Near:     Physical Exam Vitals reviewed.  Constitutional:      Appearance: Normal appearance.  HENT:     Head: Normocephalic and atraumatic.  Cardiovascular:     Rate and Rhythm: Normal rate and regular rhythm.     Heart sounds: Normal heart sounds.  Pulmonary:     Effort: Pulmonary effort is normal.     Breath sounds: Normal breath sounds.  Genitourinary:      Comments: L buttock with 2x3cm area of induration and erythema, TTP. No fluctuance. Immature abscess.   R labia majora with small 1cm abscess, spontaneously draining. No surrounding erythema or warmth. Neurological:     General: No focal deficit present.     Mental Status: She is alert and oriented to person, place, and time.  Psychiatric:        Mood and Affect: Mood normal.        Behavior: Behavior normal.        Thought Content: Thought content normal.         Judgment: Judgment normal.      UC Treatments / Results  Labs (all labs ordered are listed, but only abnormal results are displayed) Labs Reviewed - No data to display  EKG   Radiology No results found.  Procedures Procedures (including critical care time)  Medications Ordered in UC Medications  acetaminophen (TYLENOL) tablet 650 mg (has no administration in time range)    Initial Impression / Assessment and Plan / UC Course  I have reviewed the triage vital signs and the nursing notes.  Pertinent labs & imaging results that were available during my care of the patient were reviewed by me and considered in my medical decision making (see chart for details).     This patient is a 50 year old female presenting with multiple abscesses, one is spontaneously draining and one is immature abscess. Today this pt is afebrile nontachycardic nontachypneic, oxygenating well on room air, no wheezes rhonchi or rales.   Patient with Bactrim allergy, doxycycline sent as below.   Continue Epsom salt soaks.  Tylenol administered today at patient request. Nausea with oxycodone-acetaminophen but she has no issues with acetaminophen only. She has not taken any tylenol in last  8 hours.  ED return precautions discussed.   This chart was dictated using voice recognition software, Dragon. Despite the best efforts of this provider to proofread and correct errors, errors may still occur which can change documentation meaning.   Final Clinical Impressions(s) / UC Diagnoses   Final diagnoses:  Abscess of buttock  Abscess of groin     Discharge Instructions     -Start the antibiotic-doxycycline (Vibramycin), 2 pills daily for 7 days. -Continue your warm baths at home, this will help remaining pus come out. -Seek additional medical attention if your symptoms persist despite treatment, like new fever/chills, the buttock abscess gets worse and seems like it needs to drain,  etc.    ED Prescriptions    Medication Sig Dispense Auth. Provider   doxycycline (VIBRAMYCIN) 100 MG capsule Take 1 capsule (100 mg total) by mouth 2 (two) times daily for 7 days. 14 capsule Hazel Sams, PA-C     PDMP not reviewed this encounter.   Hazel Sams, PA-C 06/04/20 1739

## 2020-06-08 ENCOUNTER — Ambulatory Visit (AMBULATORY_SURGERY_CENTER): Payer: Medicaid Other

## 2020-06-08 VITALS — Ht 66.0 in | Wt 132.0 lb

## 2020-06-08 DIAGNOSIS — Z1211 Encounter for screening for malignant neoplasm of colon: Secondary | ICD-10-CM

## 2020-06-08 MED ORDER — NA SULFATE-K SULFATE-MG SULF 17.5-3.13-1.6 GM/177ML PO SOLN: 1.0000 | Freq: Once | ORAL | 0 refills | Status: AC

## 2020-06-08 NOTE — Progress Notes (Signed)
No egg or soy allergy known to patient  No issues with past sedation with any surgeries or procedures Patient denies ever being told they had issues or difficulty with intubation  No FH of Malignant Hyperthermia No diet pills per patient No home 02 use per patient  No blood thinners per patient  Pt denies issues with constipation  No A fib or A flutter  EMMI video to pt or via [place] 19 guidelines implemented in PV today with Pt and RN  Pt is fully vaccinated  for Covid      NO PA's for preps discussed with pt In PV today  Discussed with pt there will be an out-of-pocket cost for prep and that varies from $0 to 70 dollars   Due to the COVID-19 pandemic we are asking patients to follow certain guidelines.  Pt aware of COVID protocols and [facility] guidelines

## 2020-06-22 ENCOUNTER — Other Ambulatory Visit: Payer: Self-pay

## 2020-06-22 ENCOUNTER — Ambulatory Visit (AMBULATORY_SURGERY_CENTER): Payer: Medicaid Other | Admitting: Gastroenterology

## 2020-06-22 ENCOUNTER — Encounter: Payer: Self-pay | Admitting: Gastroenterology

## 2020-06-22 VITALS — BP 107/77 | HR 83 | Temp 98.2°F | Resp 12 | Ht 66.0 in | Wt 132.0 lb

## 2020-06-22 DIAGNOSIS — K529 Noninfective gastroenteritis and colitis, unspecified: Secondary | ICD-10-CM

## 2020-06-22 DIAGNOSIS — K5190 Ulcerative colitis, unspecified, without complications: Secondary | ICD-10-CM

## 2020-06-22 DIAGNOSIS — K50811 Crohn's disease of both small and large intestine with rectal bleeding: Secondary | ICD-10-CM | POA: Diagnosis not present

## 2020-06-22 DIAGNOSIS — K5140 Inflammatory polyps of colon without complications: Secondary | ICD-10-CM | POA: Diagnosis not present

## 2020-06-22 DIAGNOSIS — Z1211 Encounter for screening for malignant neoplasm of colon: Secondary | ICD-10-CM

## 2020-06-22 DIAGNOSIS — K611 Rectal abscess: Secondary | ICD-10-CM

## 2020-06-22 MED ORDER — SODIUM CHLORIDE 0.9 % IV SOLN: 500.0000 mL | Freq: Once | INTRAVENOUS | Status: DC

## 2020-06-22 NOTE — Op Note (Addendum)
[facility] Patient Name: Nicole Browning Procedure Date: 06/22/2020 1:30 PM MRN: [id number] Endoscopist: Thornton Park MD, MD Age: 50 Referring MD:  Date of Birth: April 27, 1970 Gender: Female Account #: [id number] Procedure:                Colonoscopy Indications:              Screening for colorectal malignant neoplasm, This                            is the patient's first colonoscopy                           Incidential report of rectal bleeding that patient                            attributes to hemorrhoids Medicines:                Monitored Anesthesia Care Procedure:                Pre-Anesthesia Assessment:                           - Prior to the procedure, a History and Physical                            was performed, and patient medications and                            allergies were reviewed. The patient's tolerance of                            previous anesthesia was also reviewed. The risks                            and benefits of the procedure and the sedation                            options and risks were discussed with the patient.                            All questions were answered, and informed consent                            was obtained. Prior Anticoagulants: The patient has                            taken no previous anticoagulant or antiplatelet                            agents. ASA Grade Assessment: II - A patient with                            mild systemic disease. After reviewing the risks  and benefits, the patient was deemed in                            satisfactory condition to undergo the procedure.                           After obtaining informed consent, the colonoscope                            was passed under direct vision. Throughout the                            procedure, the patient's blood pressure, pulse, and                            oxygen saturations were monitored  continuously. The                            Olympus CF-[id number] (#[id number]) Colonoscope was                            introduced through the anus and advanced to the 5                            cm into the ileum. The colonoscopy was performed                            without difficulty. The patient tolerated the                            procedure well. The quality of the bowel                            preparation was good. The terminal ileum, ileocecal                            valve, appendiceal orifice, and rectum were                            photographed. Scope In: 1:37:21 PM Scope Out: 1:55:16 PM Scope Withdrawal Time: 0 hours 14 minutes 53 seconds  Total Procedure Duration: 0 hours 17 minutes 55 seconds  Findings:                 The perianal exam findings include a suspected                            perianal abscess. There is scarring related to                            prior surgical procedure.                           A 6 mm polyp was found in the ascending colon. The  polyp was sessile. The polyp was removed with a                            cold snare. Resection and retrieval were complete.                            Estimated blood loss was minimal.                           Diffuse mild inflammation characterized by                            erythema, friability and loss of vascularity was                            found in the entire colon. The findings are most                            pronounced in the right colon and most mild in the                            rectum. Biopsies were taken with a cold forceps for                            histology. Estimated blood loss was minimal.                           Patchy inflammation, mild in severity and                            characterized by erythema, friability and aphthous                            ulcerations was found in the distal ileum. Biopsies                             were taken with a cold forceps for histology.                            Estimated blood loss was minimal.                           The exam was otherwise without abnormality on                            direct and retroflexion views except for internal                            hemorrhoids. Complications:            No immediate complications. Estimated blood loss:                            Minimal. Estimated Blood Loss:     Estimated blood  loss was minimal. Impression:               - Perianal abscess found on perianal exam.                           - One 6 mm polyp in the ascending colon, removed                            with a cold snare. Resected and retrieved.                           - Diffuse mild inflammation was found in the entire                            examined colon secondary to pancolitis. Biopsied.                           - Ileitis. Biopsied.                           - The examination was otherwise normal on direct                            and retroflexion views. Recommendation:           - Patient has a contact number available for                            emergencies. The signs and symptoms of potential                            delayed complications were discussed with the                            patient. Return to normal activities tomorrow.                            Written discharge instructions were provided to the                            patient.                           - Resume previous diet.                           - Continue present medications.                           - Avoid all NSAIDs.                           - Await pathology results.                           - Pelvic MRI for further evaluation of possible  abscess.                           - Office follow-up next available with Dr. Tarri Glenn                            or an APP.                           - Repeat colonoscopy date to be determined  after                            pending pathology results are reviewed for                            surveillance.                           - Emerging evidence supports eating a diet of                            fruits, vegetables, grains, calcium, and yogurt                            while reducing red meat and alcohol may reduce the                            risk of colon cancer.                           - Thank you for allowing me to be involved in your                            colon cancer prevention. Thornton Park MD, MD 06/22/2020 2:02:29 PM This report has been signed electronically.

## 2020-06-22 NOTE — Progress Notes (Signed)
Called to room to assist during endoscopic procedure.  Patient ID and intended procedure confirmed with present staff. Received instructions for my participation in the procedure from the performing physician.  

## 2020-06-22 NOTE — Progress Notes (Signed)
Pt's states no medical or surgical changes since previsit or office visit.   Check-in-AER  Vital Signs-CW

## 2020-06-22 NOTE — Patient Instructions (Signed)
YOU HAD AN ENDOSCOPIC PROCEDURE TODAY AT THE [facility]:   Refer to the procedure report that was given to you for any specific questions about what was found during the examination.  If the procedure report does not answer your questions, please call your gastroenterologist to clarify.  If you requested that your care partner not be given the details of your procedure findings, then the procedure report has been included in a sealed envelope for you to review at your convenience later.  YOU SHOULD EXPECT: Some feelings of bloating in the abdomen. Passage of more gas than usual.  Walking can help get rid of the air that was put into your GI tract during the procedure and reduce the bloating. If you had a lower endoscopy (such as a colonoscopy or flexible sigmoidoscopy) you may notice spotting of blood in your stool or on the toilet paper. If you underwent a bowel prep for your procedure, you may not have a normal bowel movement for a few days.  Please Note:  You might notice some irritation and congestion in your nose or some drainage.  This is from the oxygen used during your procedure.  There is no need for concern and it should clear up in a day or so.  SYMPTOMS TO REPORT IMMEDIATELY:   Following lower endoscopy (colonoscopy or flexible sigmoidoscopy):  Excessive amounts of blood in the stool  Significant tenderness or worsening of abdominal pains  Swelling of the abdomen that is new, acute  Fever of 100F or higher   Following upper endoscopy (EGD)  Vomiting of blood or coffee ground material  New chest pain or pain under the shoulder blades  Painful or persistently difficult swallowing  New shortness of breath  Fever of 100F or higher  Black, tarry-looking stools  For urgent or emergent issues, a gastroenterologist can be reached at any hour by calling [phone]. Do not use MyChart messaging for urgent concerns.    DIET:  We do recommend a small meal at first, but  then you may proceed to your regular diet.  Drink plenty of fluids but you should avoid alcoholic beverages for 24 hours.  ACTIVITY:  You should plan to take it easy for the rest of today and you should NOT DRIVE or use heavy machinery until tomorrow (because of the sedation medicines used during the test).    FOLLOW UP: Our staff will call the number listed on your records 48-72 hours following your procedure to check on you and address any questions or concerns that you may have regarding the information given to you following your procedure. If we do not reach you, we will leave a message.  We will attempt to reach you two times.  During this call, we will ask if you have developed any symptoms of COVID 19. If you develop any symptoms (ie: fever, flu-like symptoms, shortness of breath, cough etc.) before then, please call [phone].  If you test positive for Covid 19 in the 2 weeks post procedure, please call and report this information to us.    If any biopsies were taken you will be contacted by phone or by letter within the next 1-3 weeks.  Please call us at [phone] if you have not heard about the biopsies in 3 weeks.    SIGNATURES/CONFIDENTIALITY: You and/or your care partner have signed paperwork which will be entered into your electronic medical record.  These signatures attest to the fact that that the information above on   your After Visit Summary has been reviewed and is understood.  Full responsibility of the confidentiality of this discharge information lies with you and/or your care-partner. 

## 2020-06-22 NOTE — Progress Notes (Signed)
Report given to PACU, vss 

## 2020-06-25 ENCOUNTER — Other Ambulatory Visit: Payer: Self-pay

## 2020-06-25 ENCOUNTER — Encounter (HOSPITAL_COMMUNITY): Payer: Self-pay

## 2020-06-25 ENCOUNTER — Ambulatory Visit (HOSPITAL_COMMUNITY)
Admission: EM | Admit: 2020-06-25 | Discharge: 2020-06-25 | Disposition: A | Discharge status: Home / Self Care | Payer: Medicaid Other | Attending: Emergency Medicine | Admitting: Emergency Medicine

## 2020-06-25 DIAGNOSIS — L0231 Cutaneous abscess of buttock: Secondary | ICD-10-CM | POA: Diagnosis not present

## 2020-06-25 NOTE — Discharge Instructions (Signed)
Take antibiotic twice a day for the next 7 days  Warm compresses or soaks at least 4 times a day to help drainage, the more the better  If increased swelling, increased pain, fever, chills, please follow up with urgent care

## 2020-06-25 NOTE — ED Triage Notes (Signed)
Pt presents with large abscess on top of buttocks area X 2 days.

## 2020-06-26 ENCOUNTER — Telehealth: Payer: Self-pay

## 2020-06-26 DIAGNOSIS — L0231 Cutaneous abscess of buttock: Secondary | ICD-10-CM | POA: Diagnosis not present

## 2020-06-26 NOTE — Telephone Encounter (Signed)
  Follow up Call-  Call back number 06/22/2020  Post procedure Call Back phone  # [phone]  Permission to leave phone message Yes  Some recent data might be hidden     Patient questions:  Do you have a fever, pain , or abdominal swelling? No. Pain Score  0 *  Have you tolerated food without any problems? Yes.    Have you been able to return to your normal activities? Yes.    Do you have any questions about your discharge instructions: Diet   No. Medications  No. Follow up visit  Yes.    Do you have questions or concerns about your Care? No.  Actions: * If pain score is 4 or above: No action needed, pain <4.  Pt said she received an message on my chart for a follow up appointment. I told her That if that appointment was not going to work for her, to call the office back and reschedule.  Pt said she understood. Maw   1. Have you developed a fever since your procedure? no  2.   Have you had an respiratory symptoms (SOB or cough) since your procedure? no  3.   Have you tested positive for COVID 19 since your procedure no  4.   Have you had any family members/close contacts diagnosed with the COVID 19 since your procedure?  no   If yes to any of these questions please route to Joylene John, RN and Joella Prince, RN

## 2020-06-26 NOTE — ED Provider Notes (Signed)
[facility]    CSN: [id number] Arrival date & time: 06/25/20  1824      History   Chief Complaint Chief Complaint  Patient presents with  . Abscess    HPI Nicole Browning is a 50 y.o. female.   Patient presents with abscess to left buttocks for two days. Tender and painful to sit. Non draining. Attempted warm bath soaks to facilitate draining with no success. Has had abscess before but in different locations. Denies fever or chills.   Past Medical History:  Diagnosis Date  . Anemia   . GERD (gastroesophageal reflux disease)   . Perirectal abscess   . Protein-calorie malnutrition ([place]) 02/29/2016  . Tobacco use     Patient Active Problem List   Diagnosis Date Noted  . Protein-calorie malnutrition ([place]) 02/29/2016  . Elevated hemoglobin A1c 02/27/2016  . GERD (gastroesophageal reflux disease)   . Tobacco use   . Anemia of chronic disease   . Prediabetes   . Perirectal abscess 02/24/2016    Past Surgical History:  Procedure Laterality Date  . IRRIGATION AND DEBRIDEMENT ABSCESS N/A 02/24/2016   Procedure: IRRIGATION AND DEBRIDEMENT ABSCESS;  Surgeon: Stark Klein, MD;  Location: [facility];  Service: General;  Laterality: N/A;    OB History   No obstetric history on file.      Home Medications    Prior to Admission medications   Medication Sig Start Date End Date Taking? Authorizing Provider  Multiple Vitamins-Minerals (WOMENS MULTIVITAMIN PO) Take by mouth.    [provider]  VITAMIN E PO Take by mouth.    [provider]    Family History Family History  Problem Relation Age of Onset  . Diabetes Mother   . COPD Father   . Colon cancer Neg Hx   . Colon polyps Neg Hx   . Esophageal cancer Neg Hx   . Rectal cancer Neg Hx   . Stomach cancer Neg Hx     Social History Social History   Tobacco Use  . Smoking status: Current Every Day Smoker    Packs/day: 0.33    Years: 28.00    Pack years: 9.24    Types: Cigarettes  .  Smokeless tobacco: Never Used  Vaping Use  . Vaping Use: Never used  Substance Use Topics  . Alcohol use: Yes    Alcohol/week: 2.0 standard drinks    Types: 2 Glasses of wine per week  . Drug use: No     Allergies   Bactrim [sulfamethoxazole-trimethoprim], Percocet [oxycodone-acetaminophen], and Valium [diazepam]   Review of Systems Review of Systems  Constitutional: Negative.   Respiratory: Negative.   Cardiovascular: Negative.   Skin: Positive for wound. Negative for color change, pallor and rash.  Neurological: Negative.      Physical Exam Triage Vital Signs ED Triage Vitals  Enc Vitals Group     BP 06/25/20 1941 116/64     Pulse Rate 06/25/20 1941 90     Resp 06/25/20 1941 18     Temp 06/25/20 1941 99.1 F (37.3 C)     Temp Source 06/25/20 1941 Oral     SpO2 06/25/20 1941 100 %     Weight --      Height --      Head Circumference --      Peak Flow --      Pain Score 06/25/20 1943 10     Pain Loc --      Pain Edu? --  Excl. in [place]? --    No data found.  Updated Vital Signs BP 116/64 (BP Location: Left Arm)   Pulse 90   Temp 99.1 F (37.3 C) (Oral)   Resp 18   SpO2 100%   Visual Acuity Right Eye Distance:   Left Eye Distance:   Bilateral Distance:    Right Eye Near:   Left Eye Near:    Bilateral Near:     Physical Exam Constitutional:      Appearance: Normal appearance. She is normal weight.  Eyes:     Extraocular Movements: Extraocular movements intact.  Pulmonary:     Effort: Pulmonary effort is normal.  Musculoskeletal:        General: Normal range of motion.     Cervical back: Normal range of motion.  Skin:    Findings: Abscess present.     Comments: 5x5 abscess on upper left medial buttocks   Neurological:     Mental Status: She is alert and oriented to person, place, and time. Mental status is at baseline.  Psychiatric:        Mood and Affect: Mood normal.        Behavior: Behavior normal.        Thought Content: Thought  content normal.        Judgment: Judgment normal.      UC Treatments / Results  Labs (all labs ordered are listed, but only abnormal results are displayed) Labs Reviewed - No data to display  EKG   Radiology No results found.  Procedures Incision and Drainage  Date/Time: 06/26/2020 8:15 AM Performed by: Hans Eden, NP Authorized by: Hans Eden, NP   Consent:    Consent obtained:  Verbal   Consent given by:  Patient   Risks, benefits, and alternatives were discussed: yes     Risks discussed:  Bleeding, infection and pain   Alternatives discussed:  No treatment Universal protocol:    Procedure explained and questions answered to patient or proxy's satisfaction: yes     Patient identity confirmed:  Verbally with patient Location:    Type:  Abscess   Size:  5x5   Location: left buttocks  Pre-procedure details:    Skin preparation:  Chlorhexidine with alcohol Sedation:    Sedation type:  None Anesthesia:    Anesthesia method:  Local infiltration   Local anesthetic:  Lidocaine 1% WITH epi Procedure type:    Complexity:  Simple Procedure details:    Incision types:  Stab incision   Drainage:  Purulent   Drainage amount:  Copious   Wound treatment:  Wound left open   Packing materials:  None Post-procedure details:    Procedure completion:  Tolerated   (including critical care time)  Medications Ordered in UC Medications - No data to display  Initial Impression / Assessment and Plan / UC Course  I have reviewed the triage vital signs and the nursing notes.  Pertinent labs & imaging results that were available during my care of the patient were reviewed by me and considered in my medical decision making (see chart for details).  Abscess of left buttocks  1. Doxycycline 100 mg bid for 7 days 2. Warm compresses or soaks at least 4 times a day   Final Clinical Impressions(s) / UC Diagnoses   Final diagnoses:  Abscess of buttock, left      Discharge Instructions     Take antibiotic twice a day for the next 7 days  Warm compresses  or soaks at least 4 times a day to help drainage, the more the better  If increased swelling, increased pain, fever, chills, please follow up with urgent care    ED Prescriptions    None     PDMP not reviewed this encounter.   Hans Eden, [place] 06/26/20 [phone]

## 2020-06-27 ENCOUNTER — Telehealth (HOSPITAL_COMMUNITY): Payer: Self-pay | Admitting: Emergency Medicine

## 2020-06-27 MED ORDER — DOXYCYCLINE HYCLATE 100 MG PO CAPS: 100.0000 mg | ORAL_CAPSULE | Freq: Two times a day (BID) | ORAL | 0 refills | Status: AC

## 2020-06-29 ENCOUNTER — Other Ambulatory Visit: Payer: Self-pay

## 2020-06-29 DIAGNOSIS — K5190 Ulcerative colitis, unspecified, without complications: Secondary | ICD-10-CM

## 2020-07-05 ENCOUNTER — Ambulatory Visit (HOSPITAL_COMMUNITY): Admission: RE | Admit: 2020-07-05 | Payer: Medicaid Other | Source: Ambulatory Visit

## 2020-07-07 ENCOUNTER — Ambulatory Visit (HOSPITAL_COMMUNITY): Admission: RE | Admit: 2020-07-07 | Payer: Medicaid Other | Source: Ambulatory Visit

## 2020-07-09 ENCOUNTER — Ambulatory Visit (HOSPITAL_COMMUNITY)
Admission: EM | Admit: 2020-07-09 | Discharge: 2020-07-09 | Disposition: A | Discharge status: Home / Self Care | Payer: Medicaid Other | Attending: Family Medicine | Admitting: Family Medicine

## 2020-07-09 ENCOUNTER — Other Ambulatory Visit: Payer: Self-pay

## 2020-07-09 ENCOUNTER — Telehealth: Payer: Self-pay | Admitting: Gastroenterology

## 2020-07-09 ENCOUNTER — Encounter (HOSPITAL_COMMUNITY): Payer: Self-pay

## 2020-07-09 DIAGNOSIS — L0231 Cutaneous abscess of buttock: Secondary | ICD-10-CM

## 2020-07-09 DIAGNOSIS — K611 Rectal abscess: Secondary | ICD-10-CM

## 2020-07-09 DIAGNOSIS — K5190 Ulcerative colitis, unspecified, without complications: Secondary | ICD-10-CM

## 2020-07-09 MED ORDER — DOXYCYCLINE HYCLATE 100 MG PO CAPS: 100.0000 mg | ORAL_CAPSULE | Freq: Two times a day (BID) | ORAL | 0 refills | Status: DC

## 2020-07-09 NOTE — ED Provider Notes (Signed)
[facility]    CSN: [id number] Arrival date & time: 07/09/20  1817      History   Chief Complaint Chief Complaint  Patient presents with  . Abscess    HPI Nicole Browning is a 50 y.o. female.   HPI  Patient with a history of recurrent abscess presents today with painful abscess rectum. Patient has been seen here at [facility] multiple times with I&D, abscess recurred today. She reports relief until completing antibiotics recently prescribed. She is being followed by gastroenterology and recently diagnosed with ulceration colitis. She is afebrile. Past Medical History:  Diagnosis Date  . Anemia   . GERD (gastroesophageal reflux disease)   . Perirectal abscess   . Protein-calorie malnutrition ([place]) 02/29/2016  . Tobacco use   . UC (ulcerative colitis) [facility])     Patient Active Problem List   Diagnosis Date Noted  . Protein-calorie malnutrition ([place]) 02/29/2016  . Elevated hemoglobin A1c 02/27/2016  . GERD (gastroesophageal reflux disease)   . Tobacco use   . Anemia of chronic disease   . Prediabetes   . Perirectal abscess 02/24/2016    Past Surgical History:  Procedure Laterality Date  . IRRIGATION AND DEBRIDEMENT ABSCESS N/A 02/24/2016   Procedure: IRRIGATION AND DEBRIDEMENT ABSCESS;  Surgeon: Stark Klein, MD;  Location: [facility];  Service: General;  Laterality: N/A;    OB History   No obstetric history on file.      Home Medications    Prior to Admission medications   Medication Sig Start Date End Date Taking? Authorizing Provider  doxycycline (VIBRAMYCIN) 100 MG capsule Take 1 capsule (100 mg total) by mouth 2 (two) times daily. 07/09/20  Yes Scot Jun, FNP  mesalamine (LIALDA) 1.2 g EC tablet Take 1 tablet (1.2 g total) by mouth in the morning and at bedtime. 07/11/20   Noralyn Pick, NP  Multiple Vitamins-Minerals (WOMENS MULTIVITAMIN PO) Take by mouth.    [provider]  VITAMIN E PO Take by mouth.    [provider]    Family History Family History  Problem Relation Age of Onset  . Diabetes Mother   . COPD Father   . Colon cancer Neg Hx   . Colon polyps Neg Hx   . Esophageal cancer Neg Hx   . Rectal cancer Neg Hx   . Stomach cancer Neg Hx     Social History Social History   Tobacco Use  . Smoking status: Current Every Day Smoker    Packs/day: 0.33    Years: 28.00    Pack years: 9.24    Types: Cigarettes  . Smokeless tobacco: Never Used  Vaping Use  . Vaping Use: Never used  Substance Use Topics  . Alcohol use: Yes    Alcohol/week: 2.0 standard drinks    Types: 2 Glasses of wine per week  . Drug use: No     Allergies   Bactrim [sulfamethoxazole-trimethoprim], Percocet [oxycodone-acetaminophen], and Valium [diazepam]   Review of Systems Review of Systems Pertinent negatives listed in HPI  Physical Exam Triage Vital Signs ED Triage Vitals  Enc Vitals Group     BP 07/09/20 1957 107/69     Pulse Rate 07/09/20 1957 78     Resp 07/09/20 1957 18     Temp 07/09/20 1957 98.4 F (36.9 C)     Temp Source 07/09/20 1957 Oral     SpO2 07/09/20 1957 100 %     Weight --      Height --  Head Circumference --      Peak Flow --      Pain Score 07/09/20 1955 9     Pain Loc --      Pain Edu? --      Excl. in [place]? --    No data found.  Updated Vital Signs BP 107/69 (BP Location: Left Arm)   Pulse 78   Temp 98.4 F (36.9 C) (Oral)   Resp 18   SpO2 100%   Visual Acuity Right Eye Distance:   Left Eye Distance:   Bilateral Distance:    Right Eye Near:   Left Eye Near:    Bilateral Near:     Physical Exam  General appearance: alert, well developed and  well nourished Head: Normocephalic, without obvious abnormality, atraumatic Respiratory: Respirations even and unlabored, normal respiratory rate Heart: Rate and rhythm normal.  Psych: Appropriate mood and affect. Rectal exam deferred. UC Treatments / Results  Labs (all labs ordered are listed, but only abnormal  results are displayed) Labs Reviewed - No data to display  EKG   Radiology No results found.  Procedures Procedures (including critical care time)  Medications Ordered in UC Medications - No data to display  Initial Impression / Assessment and Plan / UC Course  I have reviewed the triage vital signs and the nursing notes.  Pertinent labs & imaging results that were available during my care of the patient were reviewed by me and considered in my medical decision making (see chart for details).     Recurrent abscess peri ectal region, opted against I&D as patient has been seen here at [facility] multiple times and at this points warrants general surgery evaluation. Will restart course of Doxycyline and have patient contact and schedule an appointment with [facility]. Work note provided.   Final Clinical Impressions(s) / UC Diagnoses   Final diagnoses:  Peri-rectal abscess     Discharge Instructions     Call [facility] tomorrow to schedule an evaluation of recurring abscess. You may need to follow-up with your primary care doctor as you may require a referral from them in order to be seen by [facility] surgery however this needs to be evaluated by surgeon as continuing to open up the abscess and reoccurring is only introducing infection and can worsen the recurrence of the skin abscess.    ED Prescriptions    Medication Sig Dispense Auth. Provider   doxycycline (VIBRAMYCIN) 100 MG capsule Take 1 capsule (100 mg total) by mouth 2 (two) times daily. 20 capsule Scot Jun, FNP     PDMP not reviewed this encounter.   Scot Jun, FNP 07/16/20 1114

## 2020-07-09 NOTE — Telephone Encounter (Signed)
Order in epic, pt given the phone number to reschedule the appt.

## 2020-07-09 NOTE — Discharge Instructions (Signed)
Call [facility] tomorrow to schedule an evaluation of recurring abscess. You may need to follow-up with your primary care doctor as you may require a referral from them in order to be seen by [facility] surgery however this needs to be evaluated by surgeon as continuing to open up the abscess and reoccurring is only introducing infection and can worsen the recurrence of the skin abscess.

## 2020-07-09 NOTE — ED Triage Notes (Signed)
Pt presents with recurring abscess on buttocks X 2 days.

## 2020-07-09 NOTE — Telephone Encounter (Signed)
Pt missed Mri 07/23/20 appt and would like to resch.

## 2020-07-11 ENCOUNTER — Encounter: Payer: Self-pay | Admitting: Nurse Practitioner

## 2020-07-11 ENCOUNTER — Ambulatory Visit (HOSPITAL_COMMUNITY): Payer: Medicaid Other

## 2020-07-11 ENCOUNTER — Ambulatory Visit (INDEPENDENT_AMBULATORY_CARE_PROVIDER_SITE_OTHER): Payer: Medicaid Other | Admitting: Nurse Practitioner

## 2020-07-11 ENCOUNTER — Other Ambulatory Visit (INDEPENDENT_AMBULATORY_CARE_PROVIDER_SITE_OTHER): Payer: Medicaid Other

## 2020-07-11 ENCOUNTER — Ambulatory Visit (HOSPITAL_COMMUNITY): Admission: RE | Admit: 2020-07-11 | Payer: Medicaid Other | Source: Ambulatory Visit

## 2020-07-11 VITALS — BP 90/60 | HR 96 | Ht 64.75 in | Wt 130.2 lb

## 2020-07-11 DIAGNOSIS — K611 Rectal abscess: Secondary | ICD-10-CM | POA: Diagnosis not present

## 2020-07-11 DIAGNOSIS — K5190 Ulcerative colitis, unspecified, without complications: Secondary | ICD-10-CM

## 2020-07-11 LAB — CBC WITH DIFFERENTIAL/PLATELET
Basophils Absolute: 0 10*3/uL (ref 0.0–0.1)
Basophils Relative: 0.6 % (ref 0.0–3.0)
Eosinophils Absolute: 0.2 10*3/uL (ref 0.0–0.7)
Eosinophils Relative: 2.5 % (ref 0.0–5.0)
HCT: 30.5 % — ABNORMAL LOW (ref 36.0–46.0)
Hemoglobin: 10.2 g/dL — ABNORMAL LOW (ref 12.0–15.0)
Lymphocytes Relative: 26.4 % (ref 12.0–46.0)
Lymphs Abs: 1.8 10*3/uL (ref 0.7–4.0)
MCHC: 33.4 g/dL (ref 30.0–36.0)
MCV: 82.4 fl (ref 78.0–100.0)
Monocytes Absolute: 0.7 10*3/uL (ref 0.1–1.0)
Monocytes Relative: 10.2 % (ref 3.0–12.0)
Neutro Abs: 4.1 10*3/uL (ref 1.4–7.7)
Neutrophils Relative %: 60.3 % (ref 43.0–77.0)
Platelets: 478 10*3/uL — ABNORMAL HIGH (ref 150.0–400.0)
RBC: 3.7 Mil/uL — ABNORMAL LOW (ref 3.87–5.11)
RDW: 14.6 % (ref 11.5–15.5)
WBC: 6.8 10*3/uL (ref 4.0–10.5)

## 2020-07-11 LAB — COMPREHENSIVE METABOLIC PANEL
ALT: 10 U/L (ref 0–35)
AST: 11 U/L (ref 0–37)
Albumin: 3.4 g/dL — ABNORMAL LOW (ref 3.5–5.2)
Alkaline Phosphatase: 65 U/L (ref 39–117)
BUN: 10 mg/dL (ref 6–23)
CO2: 30 mEq/L (ref 19–32)
Calcium: 9.3 mg/dL (ref 8.4–10.5)
Chloride: 105 mEq/L (ref 96–112)
Creatinine, Ser: 0.79 mg/dL (ref 0.40–1.20)
GFR: 87.55 mL/min (ref >60.00)
Glucose, Bld: 82 mg/dL (ref 70–99)
Potassium: 4 mEq/L (ref 3.5–5.1)
Sodium: 140 mEq/L (ref 135–145)
Total Bilirubin: 0.3 mg/dL (ref 0.2–1.2)
Total Protein: 7.6 g/dL (ref 6.0–8.3)

## 2020-07-11 LAB — IRON: Iron: 16 ug/dL — ABNORMAL LOW (ref 42–145)

## 2020-07-11 LAB — FERRITIN: Ferritin: 10 ng/mL (ref 10.0–291.0)

## 2020-07-11 LAB — VITAMIN D 25 HYDROXY (VIT D DEFICIENCY, FRACTURES): VITD: 11.06 ng/mL — ABNORMAL LOW (ref 30.00–100.00)

## 2020-07-11 LAB — C-REACTIVE PROTEIN: CRP: 3.9 mg/dL (ref 0.5–20.0)

## 2020-07-11 MED ORDER — MESALAMINE 1.2 G PO TBEC: 1.2000 g | DELAYED_RELEASE_TABLET | Freq: Two times a day (BID) | ORAL | 1 refills | Status: DC

## 2020-07-11 NOTE — Progress Notes (Signed)
Reviewed.  Lujean Ebright L. Winslow Verrill, MD, MPH  

## 2020-07-11 NOTE — Patient Instructions (Addendum)
If you are age 50 or younger, your body mass index should be between 19-25. Your Body mass index is 21.84 kg/m. If this is out of the aformentioned range listed, please consider follow up with your Primary Care Provider.    Please call [phone] [place]) radiology to reschedule the MRI one week from today.  We have scheduled you a follow up with Dr. Tarri Glenn on 08/21/20 at 1:50pm.  MEDICATION: We have sent the following medication to your pharmacy for you to pick up at your convenience: Lialda 1.2 gram tablet. Take 1 tablet twice a day.  It was great seeing you today! Thank you for entrusting me with your care and choosing [facility].  Noralyn Pick, CRNP

## 2020-07-11 NOTE — Progress Notes (Signed)
07/11/2020 Nicole Browning [id number] [birth year]   Chief Complaint: Colonoscopy follow up, anal abscess   History of Present Illness: Nicole Browning is a 50 year old female with a past medical history anemia and GERD.  She underwent a screening colonoscopy by Dr. Tarri Glenn on 06/22/2020 which identified a perianal abscess on exam, a 6 mm polyp was removed from the ascending colon, diffuse mild inflammation was found in the entire colon secondary to pancolitis and ileitis.  Her colonoscopy biopsy results were consistent with inflammatory bowel disease.  She was prescribed Lialda 2.4 g daily which she has not yet started.  Labs including a CRP level, CBC, CMP, vitamin D, iron, ferritin and fecal calprotectin level were ordered and were completed prior to her appointment this morning.  Following her colonoscopy, she had worsening perianal pain so she  presented to the urgent care clinic on 06/25/2020 and the perianal abscess was drained.  She is prescribed doxycycline 100 mg twice daily for 7 days with instructions to use warm compresses or soaks 4 times daily.  Continue to have left perianal pain so she presented to the ED 07/09/2020 and she was referred to general surgery.  She was evaluated by [facility] surgery on 07/10/2020 who drained her large left perianal abscess and she was prescribed Augmentin 875 mg p.o. twice daily.  She was instructed to remove the packing in 24 hours then to start warm soaks.  Currently, she has difficulty sitting on the left buttock which mean tender.  No fever or chills.  She reported having similar "boils"  to the vaginal area but this is the first episode of having a abscess to the perianal region. She is passing a normal formed brown bowel movements daily.  No straining.  No upper or lower abdominal pain.  No family history of IBD.  She took Aleve 2 tabs for a few days within the past week due to her perianal abscess related pain.  She denies chronic  NSAID use.  She is scheduled for a pelvic MRI later today, however, it is unlikely she will be able to lay flat or remain still for appropriate MRI imaging.  Advised the patient to reschedule her pelvic MRI in 1 week.   Colonoscopy 06/22/2020 by Dr. Tarri Glenn: - Perianal abscess found on perianal exam. - One 6 mm polyp in the ascending colon, removed with a cold snare. Resected and retrieved. - Diffuse mild inflammation was found in the entire examined colon secondary to pancolitis. Biopsied. - Ileitis. Biopsied. - The examination was otherwise normal on direct and retroflexion views. Biopsy Result: 1. Surgical [P], right colon bx - CHRONIC MODERATELY ACTIVE COLITIS. - NO DYSPLASIA OR MALIGNANCY. 2. Surgical [P], small bowel, terminal ileum bx - BENIGN SMALL BOWEL MUCOSA. - NO ACTIVE INFLAMMATION. - NO DYSPLASIA OR MALIGNANCY. 3. Surgical [P], colon, ascending, polyp (1) - INFLAMMATORY POLYP. - NO DYSPLASIA OR MALIGNANCY. 4. Surgical [P], colon, transverse bx - CHRONIC MILDLY ACTIVE COLITIS. - NO DYSPLASIA OR MALIGNANCY. 5. Surgical [P], left colon bx - CHRONIC MILDLY ACTIVE COLITIS. - NO DYSPLASIA OR MALIGNANCY. 6. Surgical [P], colon, rectum bx - BENIGN COLONIC MUCOSA. - NO ACTIVE INFLAMMATION. - NO DYSPLASIA OR MALIGNANCY.  Current Outpatient Medications on File Prior to Visit  Medication Sig Dispense Refill  . doxycycline (VIBRAMYCIN) 100 MG capsule Take 1 capsule (100 mg total) by mouth 2 (two) times daily. 20 capsule 0  . Multiple Vitamins-Minerals (WOMENS MULTIVITAMIN PO) Take by mouth.    Marland Kitchen  VITAMIN E PO Take by mouth.     No current facility-administered medications on file prior to visit.   Allergies  Allergen Reactions  . Bactrim [Sulfamethoxazole-Trimethoprim] Hives    Immediate body wide hives  . Percocet [Oxycodone-Acetaminophen] Nausea Only  . Valium [Diazepam] Nausea Only   Current Medications, Allergies, Past Medical History, Past Surgical History, Family  History and Social History were reviewed in [employer] record.  Review of Systems:   Constitutional: Negative for fever, sweats, chills or weight loss.  Respiratory: Negative for shortness of breath.   Cardiovascular: Negative for chest pain, palpitations and leg swelling.  Gastrointestinal: See HPI.  Musculoskeletal: Negative for back pain or muscle aches.  Neurological: Negative for dizziness, headaches or paresthesias.    Physical Exam: BP 90/60 (BP Location: Left Arm, Patient Position: Sitting, Cuff Size: Normal)   Pulse 96   Ht 5' 4.75" (1.645 m) Comment: height measured without shoes  Wt 130 lb 4 oz (59.1 kg)   BMI 21.84 kg/m  General: Well developed 50 year old female in no acute distress. Head: Normocephalic and atraumatic. Eyes: No scleral icterus. Conjunctiva pink . Ears: Normal auditory acuity. Mouth: Upper dentures. No ulcers or lesions.  Lungs: Clear throughout to auscultation. Heart: Regular rate and rhythm, no murmur. Abdomen: Soft, nontender and nondistended. No masses or hepatomegaly. Normal bowel sounds x 4 quadrants.  Rectal: Deferred. Patient is s/p left perianal abscess I & D, followed by [place] surgery.  Musculoskeletal: Symmetrical with no gross deformities. Extremities: No edema. Neurological: Alert oriented x 4. No focal deficits.  Psychological: Alert and cooperative. Normal mood and affect  Assessment and Recommendations:  23.  50 year old female with newly diagnosed IBD with left perianal abscess.  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  Biopsies of the TI without inflammation.  A benign inflammatory polyp was removed from the ascending colon. S/P left perianal abscess I & D on 4/25 and 07/10/2020.  -Patient to reschedule pelvic MRI in 1 week as she is unable to lay still for any extended period of time (secondary to left perianal abscess wound pain) which is required for accurate  MRI imaging -Pain management per [place] surgery -Start Lialda 1.2 g 2 tabs p.o. once daily  -Await CBC, CMP, iron, ferritin, CRP, vitamin D and fecal calprotectin level results  -Further recommendations to be determined after the above lab and pelvic MRI results reviewed -Follow-up with Dr. Tarri Glenn in 4 to 6 weeks    25 minutes were spent on this encounter including chart review, history/exam, colonoscopy procedure and biopsy review, medication review, coordination of care and documentation.

## 2020-07-15 LAB — STOOL CULTURE: E coli, Shiga toxin Assay: NEGATIVE

## 2020-07-15 LAB — CALPROTECTIN, FECAL

## 2020-07-17 ENCOUNTER — Other Ambulatory Visit: Payer: Self-pay

## 2020-07-17 DIAGNOSIS — K5190 Ulcerative colitis, unspecified, without complications: Secondary | ICD-10-CM

## 2020-07-17 DIAGNOSIS — D638 Anemia in other chronic diseases classified elsewhere: Secondary | ICD-10-CM

## 2020-07-18 ENCOUNTER — Telehealth: Payer: Self-pay | Admitting: Nurse Practitioner

## 2020-07-18 ENCOUNTER — Telehealth: Payer: Self-pay | Admitting: Hematology and Oncology

## 2020-07-18 NOTE — Telephone Encounter (Signed)
Patient read the potential side effects on the medication. She had questions about safety. Discussed her option of doing nothing, or taking the medication and calling me if she has any concerns, changes or other issues. Explained the mesalamine is a "first pass" medication and thought to be chiefly absorbed in the intestines.  Asked her to call if she has any concerns while on the medication. Ask for [facility]

## 2020-07-18 NOTE — Telephone Encounter (Signed)
Patient called requesting to speak with a nurse about the Lialda medication and the side effects.

## 2020-07-18 NOTE — Telephone Encounter (Signed)
Received a new hem erferral from [facility] for anemia. Nicole Browning has been cld and scheduled to see Dr. Chryl Heck on 5/24 at 8:20am. Pt aware to arrive 20 minutes early.

## 2020-07-18 NOTE — Telephone Encounter (Signed)
Please route to nurse for triage/advice. Thank you!

## 2020-07-22 ENCOUNTER — Other Ambulatory Visit: Payer: Self-pay | Admitting: Nurse Practitioner

## 2020-07-22 MED ORDER — VITAMIN D (ERGOCALCIFEROL) 1.25 MG (50000 UNIT) PO CAPS: 50000.0000 [IU] | ORAL_CAPSULE | ORAL | 0 refills | Status: DC

## 2020-07-23 ENCOUNTER — Other Ambulatory Visit (HOSPITAL_COMMUNITY): Payer: Medicaid Other

## 2020-07-23 ENCOUNTER — Telehealth: Payer: Self-pay

## 2020-07-23 DIAGNOSIS — D638 Anemia in other chronic diseases classified elsewhere: Secondary | ICD-10-CM

## 2020-07-23 NOTE — Telephone Encounter (Signed)
-----   Message from Noralyn Pick, NP sent at 07/22/2020  7:39 PM EDT ----- Lenna Sciara, pls enter a vitamin D level order to be done in 3 months. Thx

## 2020-07-23 NOTE — Telephone Encounter (Signed)
Order placed

## 2020-07-24 ENCOUNTER — Inpatient Hospital Stay: Payer: Medicaid Other

## 2020-07-24 ENCOUNTER — Ambulatory Visit (HOSPITAL_COMMUNITY)
Admission: RE | Admit: 2020-07-24 | Discharge: 2020-07-24 | Disposition: A | Discharge status: Home / Self Care | Payer: Medicaid Other | Source: Ambulatory Visit | Attending: Gastroenterology | Admitting: Gastroenterology

## 2020-07-24 ENCOUNTER — Other Ambulatory Visit: Payer: Self-pay

## 2020-07-24 ENCOUNTER — Encounter: Payer: Self-pay | Admitting: Hematology and Oncology

## 2020-07-24 ENCOUNTER — Inpatient Hospital Stay: Payer: Medicaid Other | Attending: Hematology and Oncology | Admitting: Hematology and Oncology

## 2020-07-24 VITALS — BP 115/77 | HR 96 | Temp 98.2°F | Resp 18 | Wt 130.1 lb

## 2020-07-24 DIAGNOSIS — K611 Rectal abscess: Secondary | ICD-10-CM

## 2020-07-24 DIAGNOSIS — D509 Iron deficiency anemia, unspecified: Secondary | ICD-10-CM | POA: Insufficient documentation

## 2020-07-24 DIAGNOSIS — K5190 Ulcerative colitis, unspecified, without complications: Secondary | ICD-10-CM | POA: Insufficient documentation

## 2020-07-24 DIAGNOSIS — K529 Noninfective gastroenteritis and colitis, unspecified: Secondary | ICD-10-CM

## 2020-07-24 DIAGNOSIS — K589 Irritable bowel syndrome without diarrhea: Secondary | ICD-10-CM | POA: Insufficient documentation

## 2020-07-24 DIAGNOSIS — D500 Iron deficiency anemia secondary to blood loss (chronic): Secondary | ICD-10-CM

## 2020-07-24 IMAGING — MR MR PELVIS WO/W CM
5 of 8 series · 26 of 48 positions shown · IV contrast (gadavist)
Comparison: [DATE] CT pelvis.

CLINICAL DATA: Crohn disease. History of rectal and vaginal
abscess.

EXAM:
MRI PELVIS WITHOUT AND WITH CONTRAST
TECHNIQUE: Multiplanar multisequence MR imaging of the pelvis was performed
both before and after administration of intravenous contrast.
CONTRAST:  6mL GADAVIST GADOBUTROL 1 MMOL/ML IV SOLN

[Series 2: T2 · sagittal · 2.5mm · 1.02mm/px · 6 of 46 slices shown (1 of 2)]
[im 1/46]
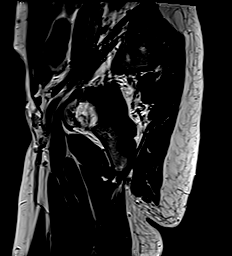
[im 10/46]
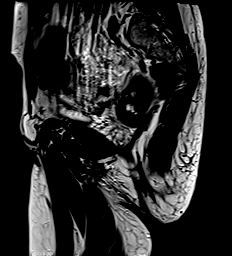
[im 19/46]
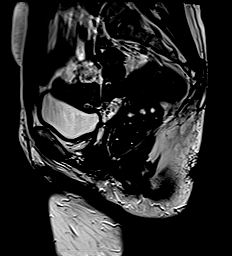
[im 28/46]
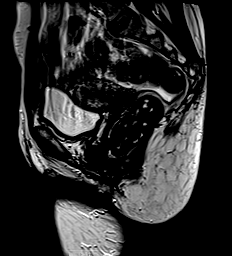
[im 37/46]
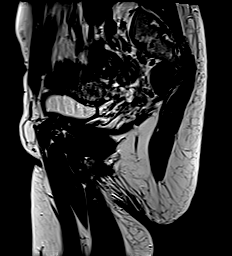
[im 46/46]
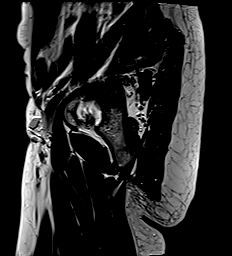

[Series 3: T2 fat-sat · sagittal · 2.5mm · 1.02mm/px · 6 of 46 slices shown (1 of 2)]
[im 1/46]
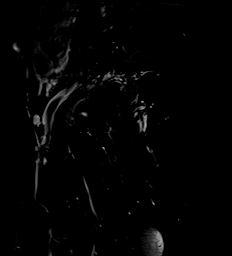
[im 10/46]
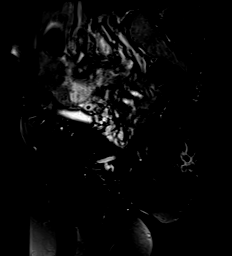
[im 19/46]
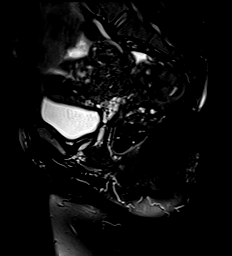
[im 28/46]
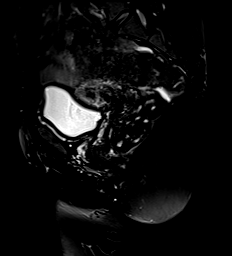
[im 37/46]
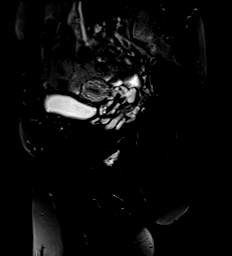
[im 46/46]
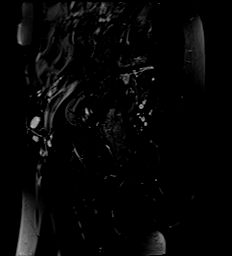

[Series 4: T1 · axial · 4.0mm · 0.43mm/px · z∈[-36,+132]mm · 6 of 36 slices shown]
[im 1/36]
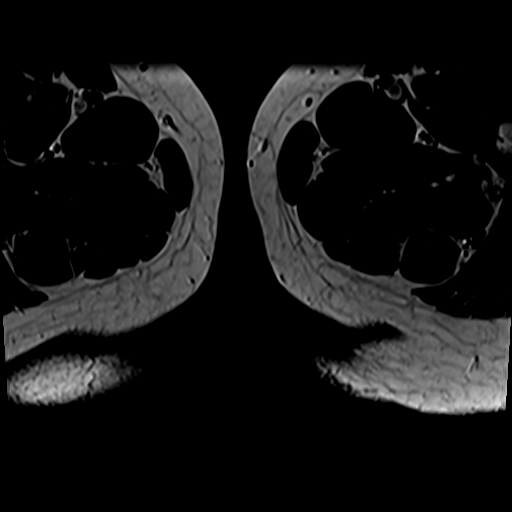
[im 8/36]
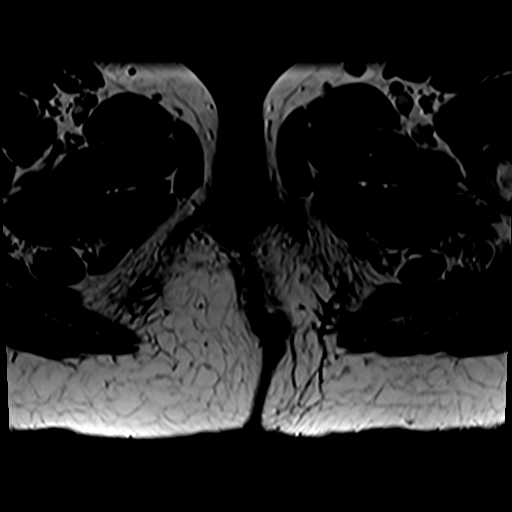
[im 15/36]
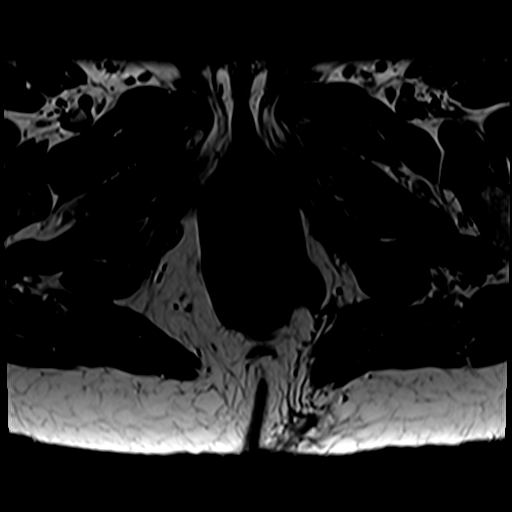
[im 22/36]
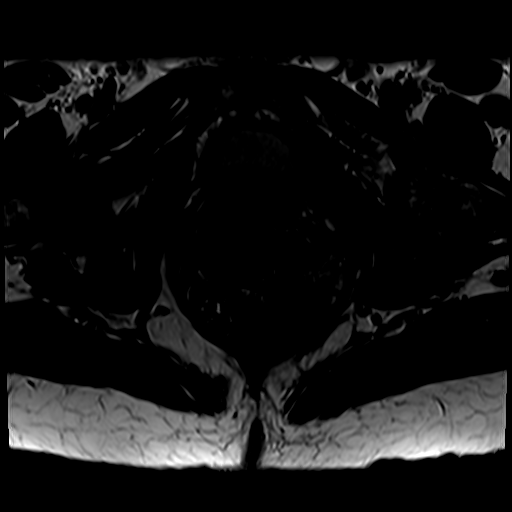
[im 29/36]
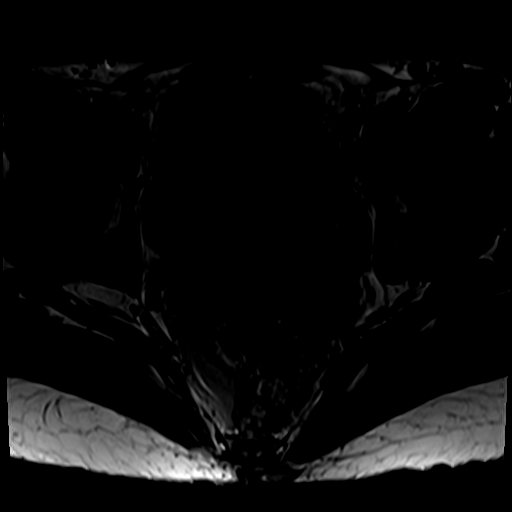
[im 36/36]
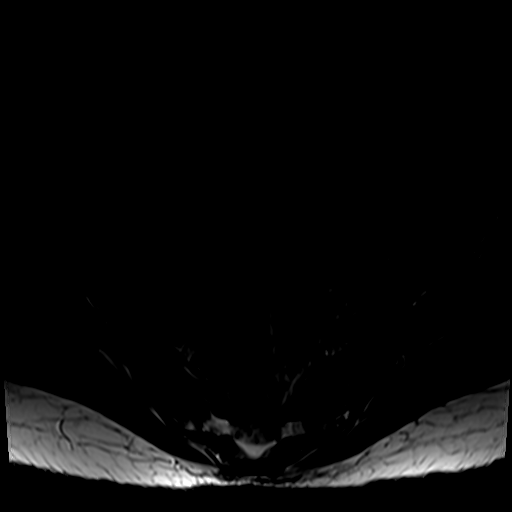

[Series 5: T2 · axial · 4.0mm · 0.43mm/px · z∈[-36,+132]mm · 6 of 36 slices shown (2 of 2)]
[im 1/36]
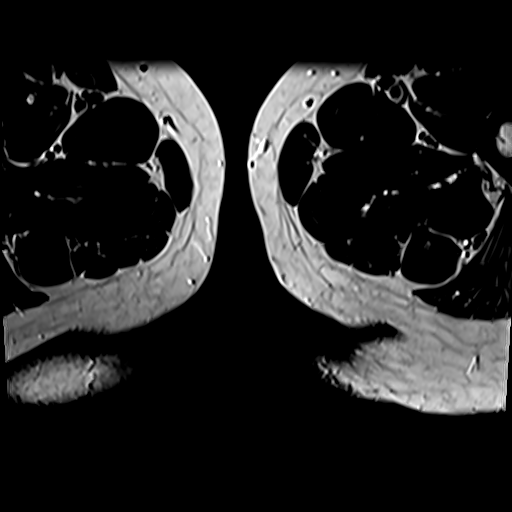
[im 8/36]
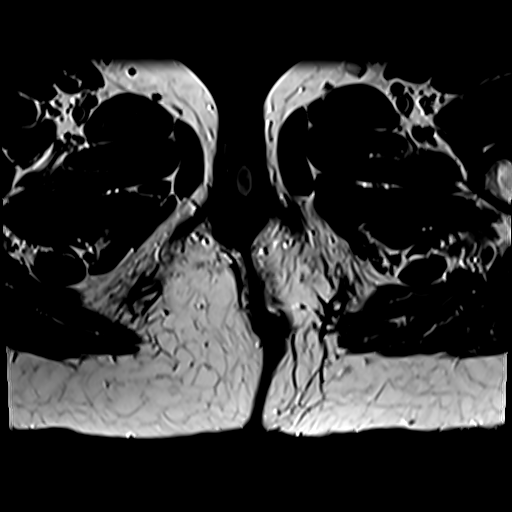
[im 15/36]
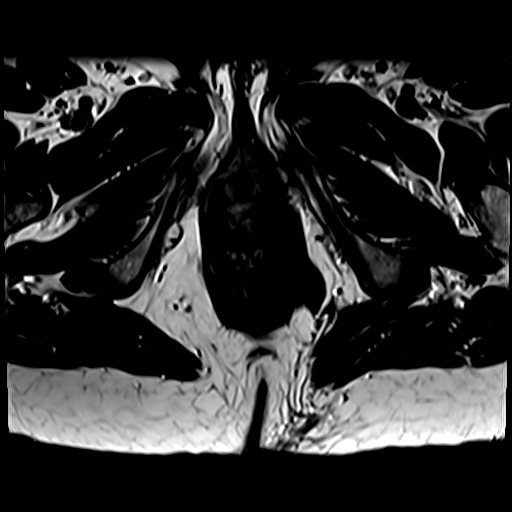
[im 22/36]
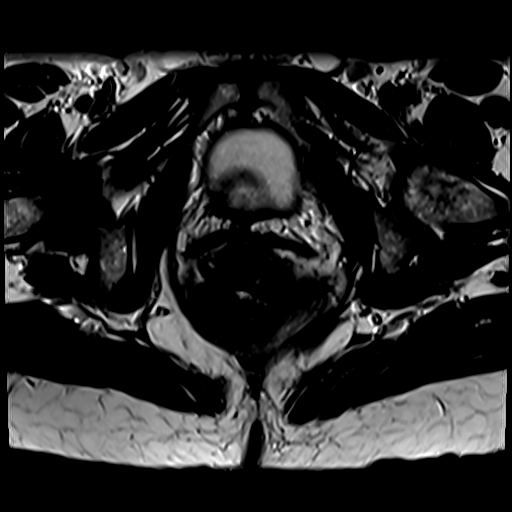
[im 29/36]
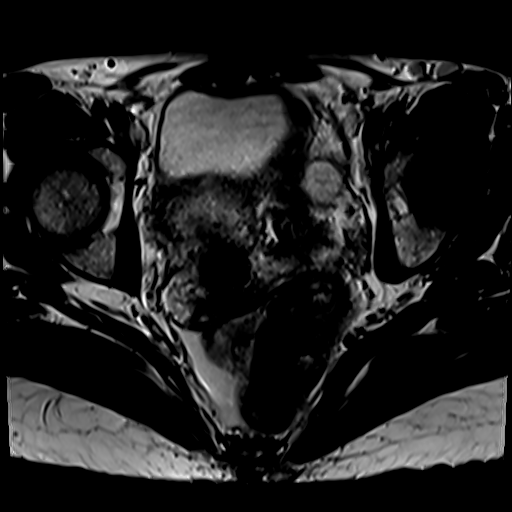
[im 36/36]
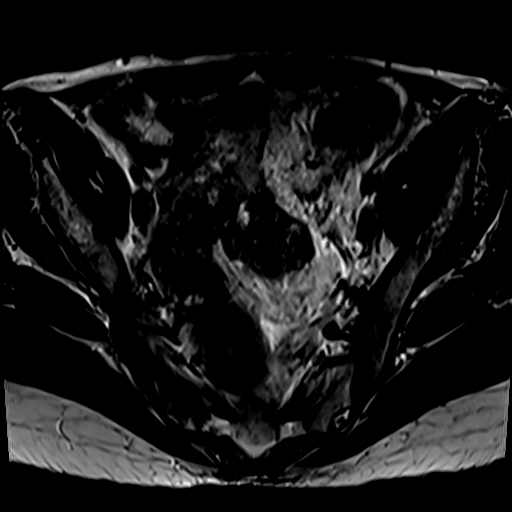

[Series 6: T2 fat-sat · axial · 4.0mm · 0.43mm/px · z∈[-36,-2]mm · 2 of 36 slices shown (2 of 2)]
[im 1/36]
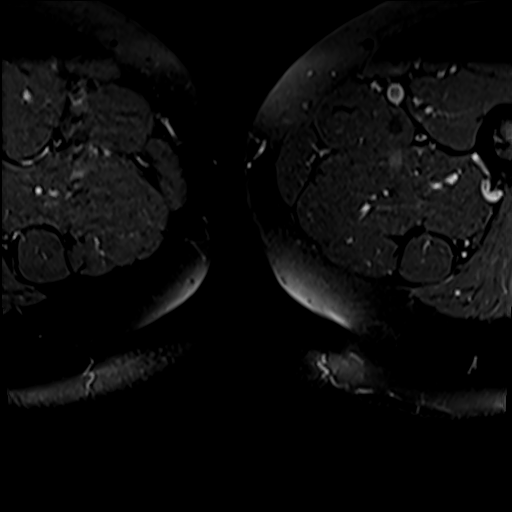
[im 8/36]
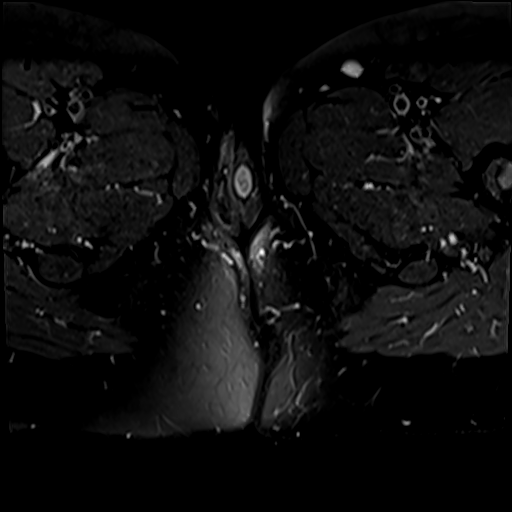

[26 of 48 positions shown; findings below may reference images not displayed]

FINDINGS: Urinary Tract:  Normal bladder.  Normal urethra.

Bowel: Visualized small and large bowel are normal caliber with no
bowel wall thickening. No evidence of perianal or perirectal fistula
or abscess.

Vascular/Lymphatic: No pathologically enlarged lymph nodes in the
pelvis. No acute vascular abnormality.

Reproductive:

Uterus: The anteverted uterus measures 6.2 x 2.9 x 4.6 cm. No
uterine fibroids. Inner myometrium (junctional zone) measures 8 mm
in thickness, which is within normal limits. Endometrium measures 2
mm in bilayer thickness, which is within normal limits. No focal
endometrial mass. Normal uterine cervix. Superficial subcutaneous
1.4 x 0.9 x 0.9 cm circumscribed cystic lesion in the left labia
(series 6/image 29) without convincing enhancement, favoring a
sebaceous cyst.

Ovaries and Adnexa: Normal right ovary. Left ovarian 2.4 x 2.0 x
cm mildly complex lesion with slight T2 heterogeneity and slight T1
hyperintensity without appreciable thickened internal septations or
solid enhancing components (series 3/image 36), compatible with a
hemorrhagic cyst. No suspicious adnexal masses.

Other: Small volume simple free fluid in pelvic cul-de-sac. Tiny
indistinct 1.1 x 0.7 x 0.9 cm T1 and T2 hyperintense focus in the
subcutaneous posterior medial left gluteal cleft (series 6/image 25)
with surrounding patchy subcutaneous fat stranding. Similar tiny
x 0.5 x 1.0 cm fluid collection in the subcutaneous high medial left
gluteal fold (series 6/image 10) with mild surrounding patchy
subcutaneous fat stranding. No evidence of fistulous communication
of these tiny collections to the anus or rectum.

Musculoskeletal: No aggressive appearing focal osseous lesions.
IMPRESSION: 1. No evidence of perianal or perirectal fistula or abscess.
2. Tiny indistinct subcutaneous collections with surrounding patchy
fat stranding in the posteromedial left gluteal fold measuring 1.1 x
0.7 x 0.9 cm inferiorly and 0.9 x 0.5 x 1.0 cm superiorly,
potentially small abscesses, probably too small to drain. No
evidence of fistulous communication of these tiny collections to the
anus or rectum.
3. Small volume simple free fluid in the pelvic cul-de-sac.
4. Left ovarian 2.4 cm hemorrhagic cyst.
5. Superficial subcutaneous 1.4 cm circumscribed cystic lesion in
the left labia, favoring a sebaceous cyst.

## 2020-07-24 MED ORDER — GADOBUTROL 1 MMOL/ML IV SOLN
6.0000 mL | Freq: Once | INTRAVENOUS | Status: AC | PRN
  Administered 2020-07-24: 6 mL via INTRAVENOUS

## 2020-07-24 NOTE — Assessment & Plan Note (Signed)
This according to the patient is an incidental finding on screening colonoscopy.  She is reluctant to take mesalamine.  I encouraged her to discuss with her gastroenterologist. She expressed understanding of the recommendations.

## 2020-07-24 NOTE — Progress Notes (Signed)
[facility] NOTE  Patient Care Team: Kerin Perna, NP as PCP - General (Internal Medicine)  CHIEF COMPLAINTS/PURPOSE OF CONSULTATION:  IDA new consult.  ASSESSMENT & PLAN:  IDA (iron deficiency anemia) This is a very pleasant 50 year old female patient with no significant past medical history who recently went for screening colonoscopy and was found to have inflammatory bowel disease on findings, also noticed to have some iron deficiency anemia referred to hematology.  Patient comes today to the appointment by herself.   She denies any new health complaints.  She absolutely has no abdominal complaints, fatigue or shortness of breath on exertion from iron deficiency. Physical examination today unremarkable, no lymphadenopathy or hepatosplenomegaly. I reviewed labs which showed ferritin of 10, hemoglobin of 10.6 consistent with mild anemia. She absolutely denies any health complaints today.  We have discussed about common cause of iron deficiency which is likely chronic blood loss versus malabsorption versus hemolysis.  In this patient, this is likely a result of chronic blood loss. Have recommended that she start oral iron supplementation ferrous sulfate 65 mg / 325 mg twice a day along with a stool softener daily and return to clinic in 8 to 10 weeks for follow-up with repeat labs.  We have discussed that if she is unable to improve her iron despite oral iron supplementation, we can certainly consider intravenous iron, I have high threshold to use intravenous iron in this patient because she is quite asymptomatic. She is agreeable to these recommendations.  I have written the name of the iron supplement and a stool softener and given it to her.    IBD (inflammatory bowel disease) This according to the patient is an incidental finding on screening colonoscopy.  She is reluctant to take mesalamine.  I encouraged her to discuss with her gastroenterologist. She  expressed understanding of the recommendations.  No orders of the defined types were placed in this encounter.  Age appropriate cancer screening discussed.  HISTORY OF PRESENTING ILLNESS:   Nicole Browning 50 y.o. female is here because of IDA  Nicole Browning is here for an initial visit for IDA. She had a screening colonoscopy recently which showed IBD. Patient says she is not having any abdominal complaints at all except she has diarrhea and some blood in stool when she eats lactose diet. She went basically for a screening colonoscopy. Nicole Browning was also diagnosed with IDA recently. She hasn't been taking any iron yet. In the past when she tried some iron,she apparently had some constipation. From anemia stand point, she denies any complaints.  No fatigue, SOB, pica, hair loss or brittle nails. Menstrual cycles are typically light with DEPO shots. Rest of the pertinent 10 point ROS reviewed and negative.  REVIEW OF SYSTEMS:    Constitutional: Denies fevers, chills or abnormal night sweats Eyes: Denies blurriness of vision, double vision or watery eyes Ears, nose, mouth, throat, and face: Denies mucositis or sore throat Respiratory: Denies cough, dyspnea or wheezes Cardiovascular: Denies palpitation, chest discomfort or lower extremity swelling Gastrointestinal:  Denies nausea, heartburn or change in bowel habits Skin: Denies abnormal skin rashes Lymphatics: Denies new lymphadenopathy or easy bruising Neurological:Denies numbness, tingling or new weaknesses Behavioral/Psych: Mood is stable, no new changes  All other systems were reviewed with the patient and are negative.  MEDICAL HISTORY:  Past Medical History:  Diagnosis Date  . Anemia   . GERD (gastroesophageal reflux disease)   . Perirectal abscess   . Protein-calorie malnutrition ([place]) 02/29/2016  .  Tobacco use   . UC (ulcerative colitis) ([place])     SURGICAL HISTORY: Past Surgical History:  Procedure Laterality Date  .  IRRIGATION AND DEBRIDEMENT ABSCESS N/A 02/24/2016   Procedure: IRRIGATION AND DEBRIDEMENT ABSCESS;  Surgeon: Stark Klein, MD;  Location: [place];  Service: General;  Laterality: N/A;    SOCIAL HISTORY: Social History   Socioeconomic History  . Marital status: Single    Spouse name: Not on file  . Number of children: Not on file  . Years of education: Not on file  . Highest education level: Not on file  Occupational History  . Not on file  Tobacco Use  . Smoking status: Current Every Day Smoker    Packs/day: 0.33    Years: 28.00    Pack years: 9.24    Types: Cigarettes  . Smokeless tobacco: Never Used  Vaping Use  . Vaping Use: Never used  Substance and Sexual Activity  . Alcohol use: Yes    Alcohol/week: 2.0 standard drinks    Types: 2 Glasses of wine per week  . Drug use: No  . Sexual activity: Not Currently    Birth control/protection: Injection  Other Topics Concern  . Not on file  Social History Narrative  . Not on file   Social Determinants of Health   Financial Resource Strain: Not on file  Food Insecurity: Not on file  Transportation Needs: Not on file  Physical Activity: Not on file  Stress: Not on file  Social Connections: Not on file  Intimate Partner Violence: Not on file    FAMILY HISTORY: Family History  Problem Relation Age of Onset  . Diabetes Mother   . COPD Father   . Colon cancer Neg Hx   . Colon polyps Neg Hx   . Esophageal cancer Neg Hx   . Rectal cancer Neg Hx   . Stomach cancer Neg Hx     ALLERGIES:  is allergic to bactrim [sulfamethoxazole-trimethoprim], percocet [oxycodone-acetaminophen], and valium [diazepam].  MEDICATIONS:  Current Outpatient Medications  Medication Sig Dispense Refill  . doxycycline (VIBRAMYCIN) 100 MG capsule Take 1 capsule (100 mg total) by mouth 2 (two) times daily. 20 capsule 0  . mesalamine (LIALDA) 1.2 g EC tablet Take 1 tablet (1.2 g total) by mouth in the morning and at bedtime. 60 tablet 1  .  Multiple Vitamins-Minerals (WOMENS MULTIVITAMIN PO) Take by mouth.    . Vitamin D, Ergocalciferol, (DRISDOL) 1.25 MG (50000 UNIT) CAPS capsule Take 1 capsule (50,000 Units total) by mouth every 7 (seven) days. TAKE ONE CAPSULE BY MOUTH ONCE WEEKLY 12 capsule 0  . VITAMIN E PO Take by mouth.     No current facility-administered medications for this visit.     PHYSICAL EXAMINATION:  ECOG PERFORMANCE STATUS: 0 - Asymptomatic  Vitals:   07/24/20 0844  BP: 115/77  Pulse: 96  Resp: 18  Temp: 98.2 F (36.8 C)  SpO2: 100%   Filed Weights   07/24/20 0844  Weight: 130 lb 1.6 oz (59 kg)    GENERAL:alert, no distress and comfortable SKIN: skin color, texture, turgor are normal, no rashes or significant lesions EYES: normal, conjunctiva are pink and non-injected, sclera clear OROPHARYNX:no exudate, no erythema and lips, buccal mucosa, and tongue normal  NECK: supple, thyroid normal size, non-tender, without nodularity LYMPH:  no palpable lymphadenopathy in the cervical, axillary or inguinal LUNGS: clear to auscultation and percussion with normal breathing effort HEART: regular rate & rhythm and no murmurs and no lower extremity  edema ABDOMEN:abdomen soft, non-tender and normal bowel sounds Musculoskeletal:no cyanosis of digits and no clubbing  PSYCH: alert & oriented x 3 with fluent speech NEURO: no focal motor/sensory deficits  LABORATORY DATA:  I have reviewed the data as listed Lab Results  Component Value Date   WBC 6.8 07/11/2020   HGB 10.2 (L) 07/11/2020   HCT 30.5 (L) 07/11/2020   MCV 82.4 07/11/2020   PLT 478.0 (H) 07/11/2020     Chemistry      Component Value Date/Time   NA 140 07/11/2020 1045   K 4.0 07/11/2020 1045   CL 105 07/11/2020 1045   CO2 30 07/11/2020 1045   BUN 10 07/11/2020 1045   CREATININE 0.79 07/11/2020 1045      Component Value Date/Time   CALCIUM 9.3 07/11/2020 1045   ALKPHOS 65 07/11/2020 1045   AST 11 07/11/2020 1045   ALT 10 07/11/2020  1045   BILITOT 0.3 07/11/2020 1045     Reviewed recent labs, colonoscopy reports.  RADIOGRAPHIC STUDIES: I have personally reviewed the radiological images as listed and agreed with the findings in the report. No results found.  All questions were answered. The patient knows to call the clinic with any problems, questions or concerns. I spent 45 minutes in the care of this patient including H and P, review of records, counseling and coordination of care.     Benay Pike, MD 07/24/2020 9:42 AM

## 2020-07-24 NOTE — Assessment & Plan Note (Signed)
This is a very pleasant 50 year old female patient with no significant past medical history who recently went for screening colonoscopy and was found to have inflammatory bowel disease on findings, also noticed to have some iron deficiency anemia referred to hematology.  Patient comes today to the appointment by herself.   She denies any new health complaints.  She absolutely has no abdominal complaints, fatigue or shortness of breath on exertion from iron deficiency. Physical examination today unremarkable, no lymphadenopathy or hepatosplenomegaly. I reviewed labs which showed ferritin of 10, hemoglobin of 10.6 consistent with mild anemia. She absolutely denies any health complaints today.  We have discussed about common cause of iron deficiency which is likely chronic blood loss versus malabsorption versus hemolysis.  In this patient, this is likely a result of chronic blood loss. Have recommended that she start oral iron supplementation ferrous sulfate 65 mg / 325 mg twice a day along with a stool softener daily and return to clinic in 8 to 10 weeks for follow-up with repeat labs.  We have discussed that if she is unable to improve her iron despite oral iron supplementation, we can certainly consider intravenous iron, I have high threshold to use intravenous iron in this patient because she is quite asymptomatic. She is agreeable to these recommendations.  I have written the name of the iron supplement and a stool softener and given it to her.

## 2020-07-31 ENCOUNTER — Telehealth: Payer: Self-pay

## 2020-07-31 NOTE — Telephone Encounter (Signed)
Error

## 2020-08-01 ENCOUNTER — Telehealth: Payer: Self-pay

## 2020-08-01 NOTE — Telephone Encounter (Signed)
LM for patient to call

## 2020-08-01 NOTE — Telephone Encounter (Signed)
-----   Message from Noralyn Pick, NP sent at 07/27/2020  8:01 AM EDT ----- Lenna Sciara, look like patient did not look at the message I previously sent her regarding her vitamin D result and RX. Pls call her and let her know her vitamin D level was low therefore I sent RX for Vitamin D 50,000iu capsule. This is a HIGH dose vitamin D so she takes one capsule by mouth once every 7 days. Let her know repeat vitamin D level due in 3 months, you already entered the order. THX!

## 2020-08-21 ENCOUNTER — Ambulatory Visit: Payer: Medicaid Other | Admitting: Gastroenterology

## 2020-09-14 ENCOUNTER — Ambulatory Visit: Payer: Medicaid Other | Admitting: Gastroenterology

## 2020-09-25 ENCOUNTER — Emergency Department (HOSPITAL_COMMUNITY): Payer: Medicaid Other

## 2020-09-25 ENCOUNTER — Ambulatory Visit (HOSPITAL_COMMUNITY)
Admission: EM | Admit: 2020-09-25 | Discharge: 2020-09-25 | Disposition: A | Discharge status: Home / Self Care | Payer: Medicaid Other

## 2020-09-25 ENCOUNTER — Encounter (HOSPITAL_COMMUNITY): Payer: Self-pay | Admitting: Internal Medicine

## 2020-09-25 ENCOUNTER — Encounter (HOSPITAL_COMMUNITY): Payer: Self-pay

## 2020-09-25 ENCOUNTER — Inpatient Hospital Stay (HOSPITAL_COMMUNITY)
Admission: EM | Admit: 2020-09-25 | Discharge: 2020-10-05 | DRG: 682 | Disposition: A | Discharge status: Home / Self Care | Payer: Medicaid Other | Attending: Family Medicine | Admitting: Family Medicine

## 2020-09-25 ENCOUNTER — Other Ambulatory Visit: Payer: Self-pay

## 2020-09-25 DIAGNOSIS — K2210 Ulcer of esophagus without bleeding: Secondary | ICD-10-CM | POA: Diagnosis present

## 2020-09-25 DIAGNOSIS — R7881 Bacteremia: Secondary | ICD-10-CM | POA: Diagnosis present

## 2020-09-25 DIAGNOSIS — K639 Disease of intestine, unspecified: Secondary | ICD-10-CM

## 2020-09-25 DIAGNOSIS — Z20822 Contact with and (suspected) exposure to covid-19: Secondary | ICD-10-CM | POA: Diagnosis present

## 2020-09-25 DIAGNOSIS — R1030 Lower abdominal pain, unspecified: Secondary | ICD-10-CM | POA: Diagnosis not present

## 2020-09-25 DIAGNOSIS — N179 Acute kidney failure, unspecified: Principal | ICD-10-CM

## 2020-09-25 DIAGNOSIS — E8809 Other disorders of plasma-protein metabolism, not elsewhere classified: Secondary | ICD-10-CM | POA: Diagnosis not present

## 2020-09-25 DIAGNOSIS — N390 Urinary tract infection, site not specified: Secondary | ICD-10-CM

## 2020-09-25 DIAGNOSIS — K67 Disorders of peritoneum in infectious diseases classified elsewhere: Secondary | ICD-10-CM

## 2020-09-25 DIAGNOSIS — K529 Noninfective gastroenteritis and colitis, unspecified: Secondary | ICD-10-CM | POA: Diagnosis present

## 2020-09-25 DIAGNOSIS — K651 Peritoneal abscess: Secondary | ICD-10-CM

## 2020-09-25 DIAGNOSIS — K449 Diaphragmatic hernia without obstruction or gangrene: Secondary | ICD-10-CM | POA: Diagnosis present

## 2020-09-25 DIAGNOSIS — K219 Gastro-esophageal reflux disease without esophagitis: Secondary | ICD-10-CM | POA: Diagnosis present

## 2020-09-25 DIAGNOSIS — M461 Sacroiliitis, not elsewhere classified: Secondary | ICD-10-CM | POA: Diagnosis present

## 2020-09-25 DIAGNOSIS — K5090 Crohn's disease, unspecified, without complications: Secondary | ICD-10-CM | POA: Diagnosis present

## 2020-09-25 DIAGNOSIS — Z79899 Other long term (current) drug therapy: Secondary | ICD-10-CM

## 2020-09-25 DIAGNOSIS — E876 Hypokalemia: Secondary | ICD-10-CM | POA: Diagnosis not present

## 2020-09-25 DIAGNOSIS — R188 Other ascites: Secondary | ICD-10-CM | POA: Diagnosis present

## 2020-09-25 DIAGNOSIS — E43 Unspecified severe protein-calorie malnutrition: Secondary | ICD-10-CM | POA: Insufficient documentation

## 2020-09-25 DIAGNOSIS — I959 Hypotension, unspecified: Secondary | ICD-10-CM

## 2020-09-25 DIAGNOSIS — Z885 Allergy status to narcotic agent status: Secondary | ICD-10-CM

## 2020-09-25 DIAGNOSIS — R109 Unspecified abdominal pain: Secondary | ICD-10-CM

## 2020-09-25 DIAGNOSIS — Z882 Allergy status to sulfonamides status: Secondary | ICD-10-CM

## 2020-09-25 DIAGNOSIS — L0291 Cutaneous abscess, unspecified: Secondary | ICD-10-CM

## 2020-09-25 DIAGNOSIS — B955 Unspecified streptococcus as the cause of diseases classified elsewhere: Secondary | ICD-10-CM

## 2020-09-25 DIAGNOSIS — K6389 Other specified diseases of intestine: Secondary | ICD-10-CM | POA: Diagnosis present

## 2020-09-25 DIAGNOSIS — K2960 Other gastritis without bleeding: Secondary | ICD-10-CM | POA: Diagnosis present

## 2020-09-25 DIAGNOSIS — D509 Iron deficiency anemia, unspecified: Secondary | ICD-10-CM | POA: Diagnosis present

## 2020-09-25 DIAGNOSIS — R000 Tachycardia, unspecified: Secondary | ICD-10-CM

## 2020-09-25 DIAGNOSIS — E871 Hypo-osmolality and hyponatremia: Secondary | ICD-10-CM | POA: Diagnosis present

## 2020-09-25 DIAGNOSIS — Z7982 Long term (current) use of aspirin: Secondary | ICD-10-CM

## 2020-09-25 DIAGNOSIS — R160 Hepatomegaly, not elsewhere classified: Secondary | ICD-10-CM

## 2020-09-25 DIAGNOSIS — F17210 Nicotine dependence, cigarettes, uncomplicated: Secondary | ICD-10-CM | POA: Diagnosis present

## 2020-09-25 DIAGNOSIS — K750 Abscess of liver: Secondary | ICD-10-CM

## 2020-09-25 DIAGNOSIS — Z6821 Body mass index (BMI) 21.0-21.9, adult: Secondary | ICD-10-CM

## 2020-09-25 DIAGNOSIS — K769 Liver disease, unspecified: Secondary | ICD-10-CM

## 2020-09-25 HISTORY — DX: Irritable bowel syndrome, unspecified: K58.9

## 2020-09-25 LAB — COMPREHENSIVE METABOLIC PANEL
ALT: 231 U/L — ABNORMAL HIGH (ref 0–44)
AST: 133 U/L — ABNORMAL HIGH (ref 15–41)
Albumin: 2.6 g/dL — ABNORMAL LOW (ref 3.5–5.0)
Alkaline Phosphatase: 79 U/L (ref 38–126)
Anion gap: 14 (ref 5–15)
BUN: 43 mg/dL — ABNORMAL HIGH (ref 6–20)
CO2: 24 mmol/L (ref 22–32)
Calcium: 8.8 mg/dL — ABNORMAL LOW (ref 8.9–10.3)
Chloride: 94 mmol/L — ABNORMAL LOW (ref 98–111)
Creatinine, Ser: 2.54 mg/dL — ABNORMAL HIGH (ref 0.44–1.00)
GFR, Estimated: 22 mL/min — ABNORMAL LOW (ref >60)
Glucose, Bld: 116 mg/dL — ABNORMAL HIGH (ref 70–99)
Potassium: 3.9 mmol/L (ref 3.5–5.1)
Sodium: 132 mmol/L — ABNORMAL LOW (ref 135–145)
Total Bilirubin: 0.8 mg/dL (ref 0.3–1.2)
Total Protein: 8.1 g/dL (ref 6.5–8.1)

## 2020-09-25 LAB — CBC
HCT: 36.5 % (ref 36.0–46.0)
Hemoglobin: 11.7 g/dL — ABNORMAL LOW (ref 12.0–15.0)
MCH: 26.7 pg (ref 26.0–34.0)
MCHC: 32.1 g/dL (ref 30.0–36.0)
MCV: 83.1 fL (ref 80.0–100.0)
Platelets: 319 10*3/uL (ref 150–400)
RBC: 4.39 MIL/uL (ref 3.87–5.11)
RDW: 15.9 % — ABNORMAL HIGH (ref 11.5–15.5)
WBC: 15 10*3/uL — ABNORMAL HIGH (ref 4.0–10.5)
nRBC: 0 % (ref 0.0–0.2)

## 2020-09-25 LAB — URINALYSIS, ROUTINE W REFLEX MICROSCOPIC
Bacteria, UA: NONE SEEN
Bilirubin Urine: NEGATIVE
Glucose, UA: NEGATIVE mg/dL
Ketones, ur: NEGATIVE mg/dL
Leukocytes,Ua: NEGATIVE
Nitrite: NEGATIVE
Protein, ur: 100 mg/dL — AB
RBC / HPF: >50 RBC/hpf — ABNORMAL HIGH (ref 0–5)
Specific Gravity, Urine: 1.021 (ref 1.005–1.030)
pH: 5 (ref 5.0–8.0)

## 2020-09-25 LAB — LIPASE, BLOOD: Lipase: 22 U/L (ref 11–51)

## 2020-09-25 IMAGING — CT CT ABD-PELV W/O CM
2 of 4 series · 14 of 46 positions shown, 16 images · non-contrast
Comparison: [DATE]

CLINICAL DATA: Abdominal pain, acute nonlocalized abdominal pain
associated with muscle spasms.

EXAM:
CT ABDOMEN AND PELVIS WITHOUT CONTRAST
TECHNIQUE: Multidetector CT imaging of the abdomen and pelvis was performed
following the standard protocol without IV contrast.

[Series 3: abd/ pelvis 5.0 i30f 2 · axial · 0.66mm/px · z∈[+964,+1330]mm · 11 of 87 slices shown, 13 images]
[im 7/87  soft-tissue]
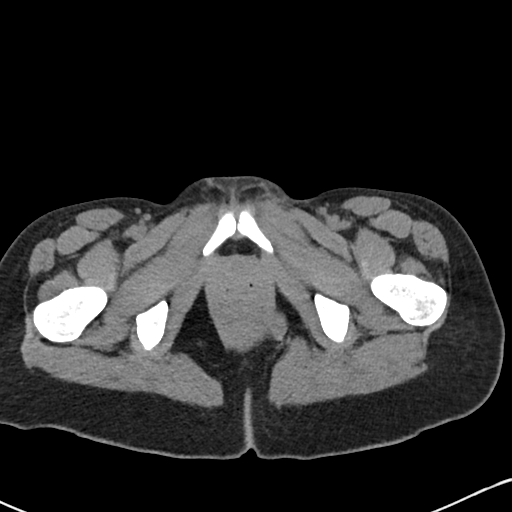
[im 7/87  bone]
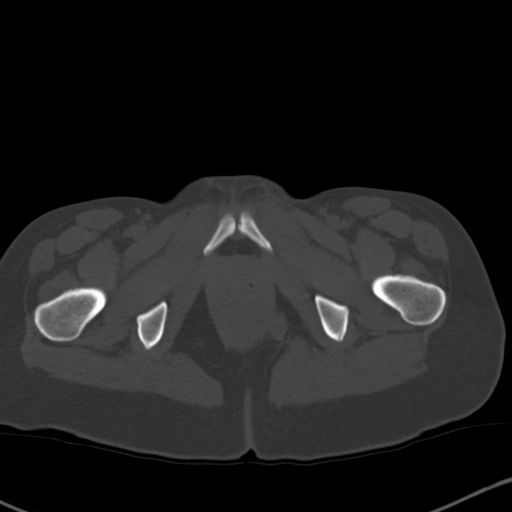
[im 14/87  soft-tissue]
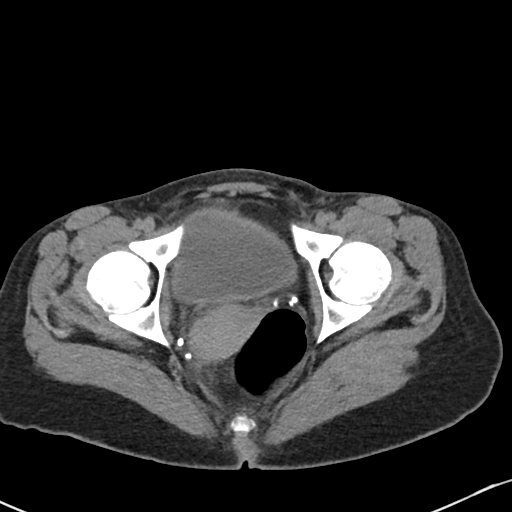
[im 20/87  soft-tissue]
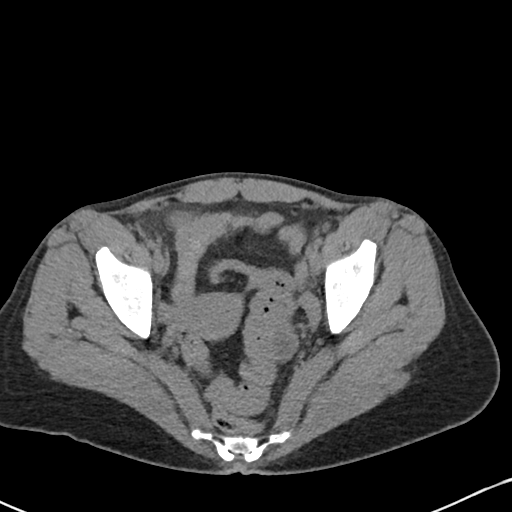
[im 27/87  soft-tissue]
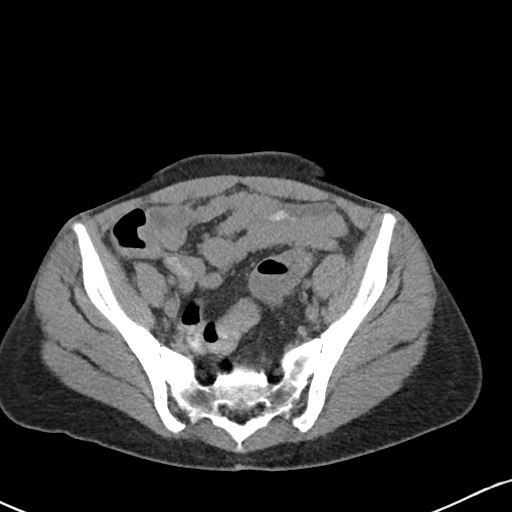
[im 37/87  soft-tissue]
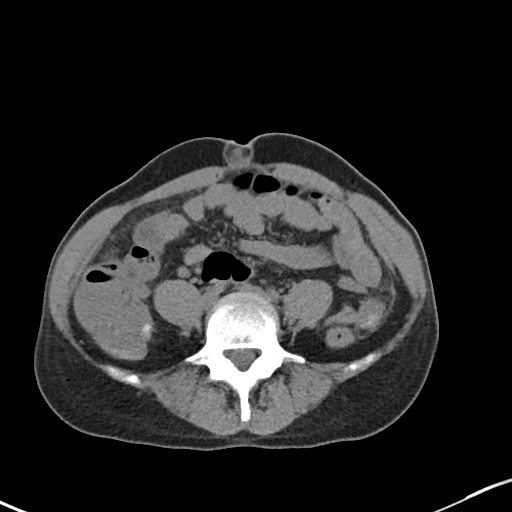
[im 44/87  soft-tissue]
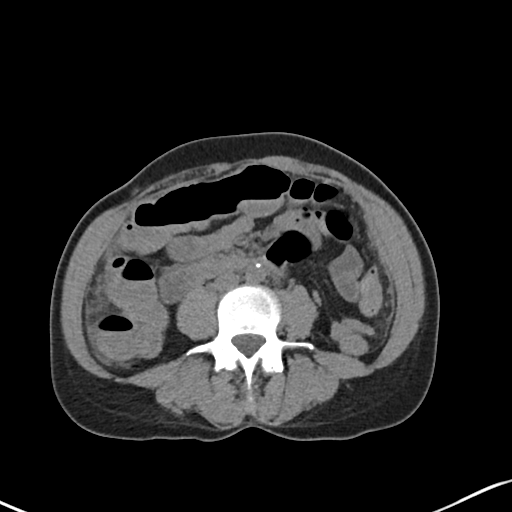
[im 50/87  soft-tissue]
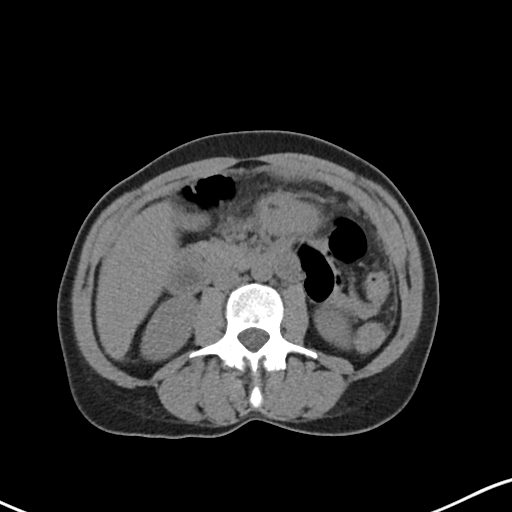
[im 60/87  soft-tissue]
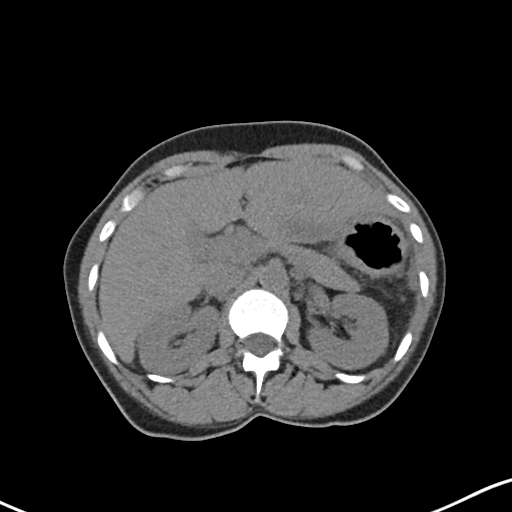
[im 67/87  soft-tissue]
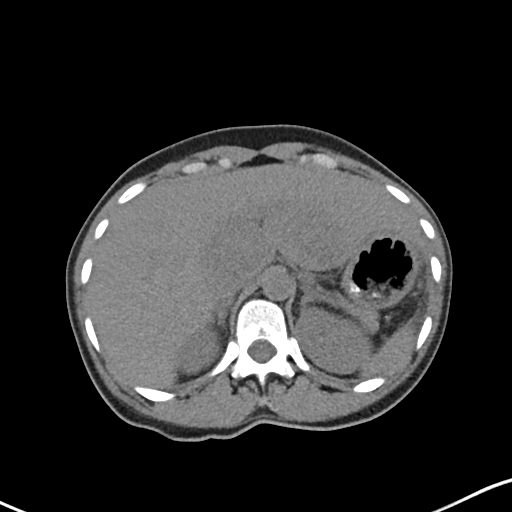
[im 67/87  bone]
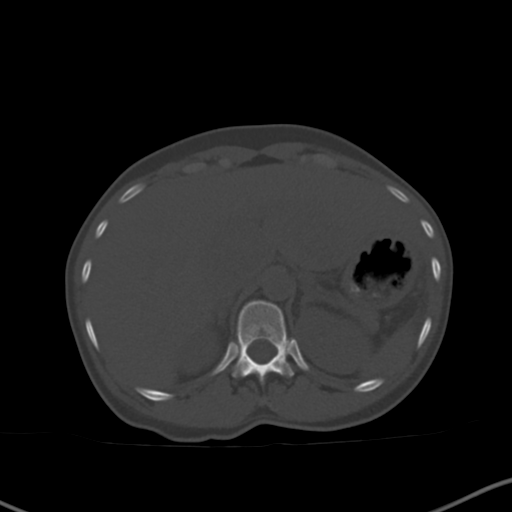
[im 73/87  soft-tissue]
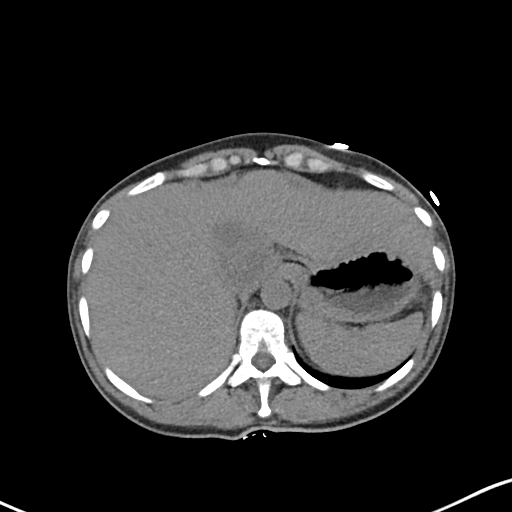
[im 80/87  soft-tissue]
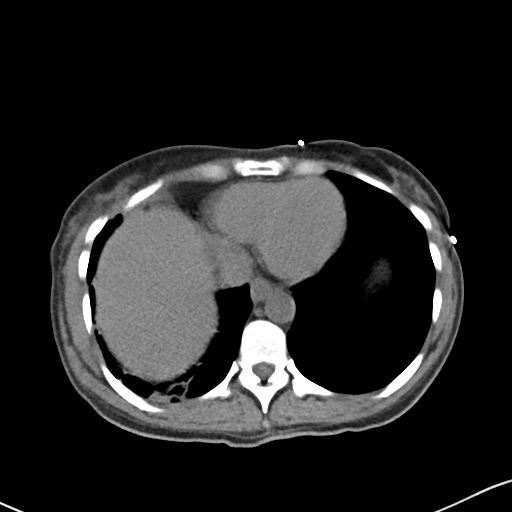

[Series 6: cor st · coronal · 0.69mm/px · 3 of 86 slices shown]
[im 29/86  soft-tissue]
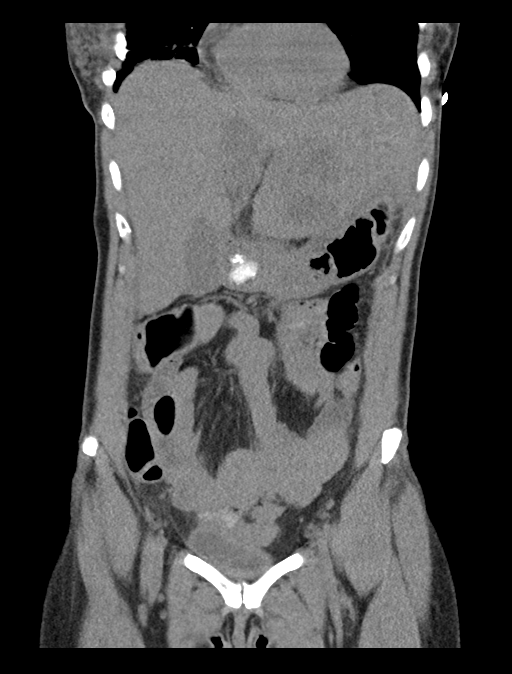
[im 38/86  soft-tissue]
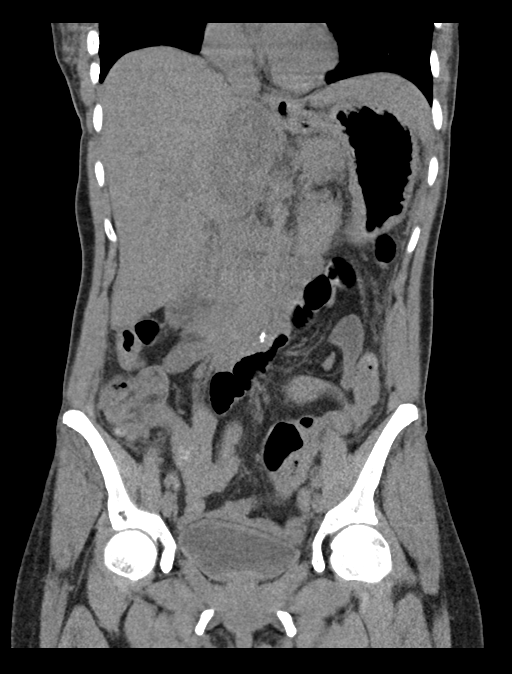
[im 48/86  soft-tissue]
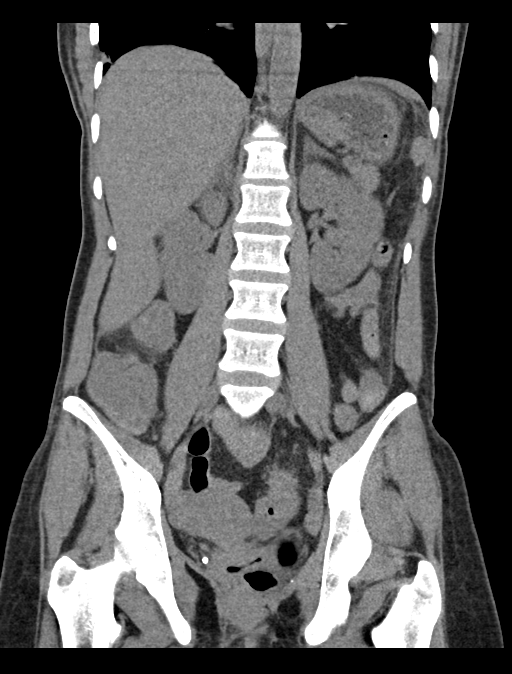

[14 of 46 positions shown; findings below may reference images not displayed]

FINDINGS: Lower chest: Basilar atelectasis/early consolidation on the RIGHT.
No effusion.

Hepatobiliary: Diffuse central low attenuation in the liver spanning
medial an lateral segment of LEFT hepatic lobe along the fissure for
false form ligament measuring up to 9.3 x 4.3 cm in total. This
appears either anterior 2 or also involving intrahepatic inferior
vena cava tracking towards the confluence of the hepatic veins. No
pericholecystic stranding. No additional lesions visualized.

Pancreas: Not well visualized. Grossly unremarkable and without
signs of inflammation.

Spleen: Spleen normal size and contour though with serosal disease
suspected along the anterior margin. See below.

Adrenals/Urinary Tract: Adrenal glands are normal.

Kidneys with smooth contours. No hydronephrosis. Urinary bladder is
collapsed.

Stomach/Bowel: Gastric thickening is suggested in the area of the
gastric antrum with some perigastric stranding. Mild distension of
small bowel loops in the abdomen without signs of gross obstruction.
Colonic loops decompressed distal to the transverse colon.
Suggestion of low-attenuation focus along the margin of the sigmoid
(image 67/3) this is suspicious for serosal implant with an adjacent
soft tissue nodule in the sigmoid mesentery (image 66/6) 2.3 x
cm. Mid sigmoid abnormality as discussed below.

Visualized portions of the appendix are normal.

Vascular/Lymphatic: Calcified atheromatous plaque, scattered
throughout the normal caliber abdominal aorta. Smooth contour of the
IVC. Scattered small lymph nodes in the retroperitoneum. None with
pathologic enlargement. No gross adenopathy in the abdomen though
with ill-defined soft tissue in the porta hepatis that may represent
conglomerate adenopathy the largest potentially 13 mm short axis
(image [DATE])

Scattered lymph nodes adjacent to rectosigmoid colon best seen on
coronal image 76, adjacent to either soft tissue implant or large
lymph node. Adjacent to the dominant soft tissue nodule adjacent to
the colon there is question of colonic thickening from which this
area arises. This is at the level of the mid sigmoid colon.

Ovaries along the bilateral pelvic sidewalls.

Reproductive: Ovaries along the bilateral pelvic sidewalls. No
adnexal mass. Uterus unremarkable.

Other: While there is no ascites. There is fascial thickening with
nodularity, for example on image 28 of series 3 6 mm thickening of
the abdominal fascia adjacent to the stomach. Nodularity tracking
along the anterior and lateral transversalis and in the lateral
conal fascia in the LEFT hemiabdomen. Indistinct appearance of the
omental fat raising the question of omental disease.

Musculoskeletal: No acute musculoskeletal findings. Signs of
sacroiliitis with asymmetric involvement of the RIGHT sacral iliac
joint. No destructive bone finding or acute bone process.
IMPRESSION: 1. Findings most suspicious for sigmoid neoplasm with hepatic
metastatic disease, peritoneal disease and nodal disease at the
porta hepatis. Hepatic MRI or CT with contrast may be helpful as the
patient is able for further evaluation.
2. Question of gastric antral thickening.  Areas not well assessed.
3. Signs of sacroiliitis with asymmetric involvement of the RIGHT
sacral iliac joint. Correlate with any clinical or laboratory
evidence of seronegative spondyloarthropathy.
4. Juxta diaphragmatic airspace disease in the RIGHT chest favored
to represent juxta diaphragmatic atelectasis.
5. Aortic atherosclerosis.

## 2020-09-25 MED ORDER — ONDANSETRON HCL 4 MG/2ML IJ SOLN
4.0000 mg | Freq: Once | INTRAMUSCULAR | Status: AC
  Administered 2020-09-25: 4 mg via INTRAVENOUS
  Filled 2020-09-25: qty 2

## 2020-09-25 MED ORDER — ONDANSETRON HCL 4 MG PO TABS: 4.0000 mg | ORAL_TABLET | Freq: Four times a day (QID) | ORAL | Status: DC | PRN

## 2020-09-25 MED ORDER — LACTATED RINGERS IV SOLN: INTRAVENOUS | Status: AC

## 2020-09-25 MED ORDER — MORPHINE SULFATE (PF) 4 MG/ML IV SOLN
4.0000 mg | Freq: Once | INTRAVENOUS | Status: AC
  Administered 2020-09-25: 4 mg via INTRAVENOUS
  Filled 2020-09-25: qty 1

## 2020-09-25 MED ORDER — MESALAMINE 1.2 G PO TBEC
1.2000 g | DELAYED_RELEASE_TABLET | Freq: Two times a day (BID) | ORAL | Status: DC
  Administered 2020-09-26 – 2020-10-05 (×18): 1.2 g via ORAL
  Filled 2020-09-25 (×21): qty 1

## 2020-09-25 MED ORDER — SODIUM CHLORIDE 0.9 % IV SOLN
1.0000 g | Freq: Once | INTRAVENOUS | Status: AC
  Administered 2020-09-25: 1 g via INTRAVENOUS
  Filled 2020-09-25: qty 10

## 2020-09-25 MED ORDER — MORPHINE SULFATE (PF) 2 MG/ML IV SOLN
0.5000 mg | INTRAVENOUS | Status: DC | PRN
  Administered 2020-09-26 – 2020-09-27 (×5): 0.5 mg via INTRAVENOUS
  Filled 2020-09-25 (×5): qty 1

## 2020-09-25 MED ORDER — SODIUM CHLORIDE 0.9 % IV SOLN: 1.0000 g | INTRAVENOUS | Status: DC

## 2020-09-25 MED ORDER — ONDANSETRON HCL 4 MG/2ML IJ SOLN
4.0000 mg | Freq: Four times a day (QID) | INTRAMUSCULAR | Status: DC | PRN
  Administered 2020-09-26 – 2020-09-29 (×2): 4 mg via INTRAVENOUS
  Filled 2020-09-25: qty 2

## 2020-09-25 MED ORDER — SODIUM CHLORIDE 0.9 % IV BOLUS
1000.0000 mL | Freq: Once | INTRAVENOUS | Status: AC
  Administered 2020-09-25: 1000 mL via INTRAVENOUS

## 2020-09-25 NOTE — ED Notes (Signed)
Patient is being discharged from the Urgent Care and sent to the Emergency Department via [facility] . Per Curly [place], PA, patient is in need of higher level of care due to Abnormal vital signs and severe abdominal pain. Patient is aware and verbalizes understanding of plan of care.  Vitals:   09/25/20 1501 09/25/20 1511  BP:  (!) 78/54  Pulse: (!) 123 (!) 124  Temp: 98.1 F (36.7 C)   SpO2: 98%     Tried contacting [facility] charge near, phone call was disconnected. Attempted second call, no answer.

## 2020-09-25 NOTE — ED Provider Notes (Signed)
[facility]    CSN: [id number] Arrival date & time: 09/25/20  1237      History   Chief Complaint Chief Complaint  Patient presents with   Abdominal Pain    HPI Nicole Browning is a 50 y.o. female.   Patient presenting today with 2-day history of severe lower abdominal pain that she rates a 10 out of 10, intolerance to p.o. due to nausea and vomiting, fatigue, mild dizziness.  Denies chest pain, shortness of breath, headache, diarrhea, syncope, melena.  States she has not had a bowel movement since onset of symptoms but is able to pass gas.  Has not eaten anything since onset.  Does have a history of IBS and ulcerative colitis but states this feels very different from that.  No new foods, recent travel, medication changes, sick contacts.   Past Medical History:  Diagnosis Date   Anemia    GERD (gastroesophageal reflux disease)    IBS (irritable bowel syndrome)    Perirectal abscess    Protein-calorie malnutrition ([place]) 02/29/2016   Tobacco use    UC (ulcerative colitis) ([place])    Patient Active Problem List   Diagnosis Date Noted   IDA (iron deficiency anemia) 07/24/2020   IBD (inflammatory bowel disease) 07/24/2020   Protein-calorie malnutrition ([place]) 02/29/2016   Elevated hemoglobin A1c 02/27/2016   GERD (gastroesophageal reflux disease)    Tobacco use    Anemia of chronic disease    Prediabetes    Perirectal abscess 02/24/2016    Past Surgical History:  Procedure Laterality Date   IRRIGATION AND DEBRIDEMENT ABSCESS N/A 02/24/2016   Procedure: IRRIGATION AND DEBRIDEMENT ABSCESS;  Surgeon: Stark Klein, MD;  Location: [facility];  Service: General;  Laterality: N/A;   OB History   No obstetric history on file.      Home Medications    Prior to Admission medications   Medication Sig Start Date End Date Taking? Authorizing Provider  doxycycline (VIBRAMYCIN) 100 MG capsule Take 1 capsule (100 mg total) by mouth 2 (two) times daily. 07/09/20   Scot Jun, FNP  mesalamine (LIALDA) 1.2 g EC tablet Take 1 tablet (1.2 g total) by mouth in the morning and at bedtime. 07/11/20   Noralyn Pick, NP  Multiple Vitamins-Minerals (WOMENS MULTIVITAMIN PO) Take by mouth.    [provider]  Vitamin D, Ergocalciferol, (DRISDOL) 1.25 MG (50000 UNIT) CAPS capsule Take 1 capsule (50,000 Units total) by mouth every 7 (seven) days. TAKE ONE CAPSULE BY MOUTH ONCE WEEKLY 07/22/20   Noralyn Pick, NP  VITAMIN E PO Take by mouth.    [provider]    Family History Family History  Problem Relation Age of Onset   Diabetes Mother    COPD Father    Colon cancer Neg Hx    Colon polyps Neg Hx    Esophageal cancer Neg Hx    Rectal cancer Neg Hx    Stomach cancer Neg Hx     Social History Social History   Tobacco Use   Smoking status: Every Day    Packs/day: 0.33    Years: 28.00    Pack years: 9.24    Types: Cigarettes   Smokeless tobacco: Never  Vaping Use   Vaping Use: Never used  Substance Use Topics   Alcohol use: Yes    Alcohol/week: 2.0 standard drinks    Types: 2 Glasses of wine per week   Drug use: No     Allergies  Bactrim [sulfamethoxazole-trimethoprim], Percocet [oxycodone-acetaminophen], and Valium [diazepam]   Review of Systems Review of Systems Per HPI  Physical Exam Triage Vital Signs ED Triage Vitals  Enc Vitals Group     BP 09/25/20 1458 (!) 68/55     Pulse Rate 09/25/20 1458 (!) 130     Resp --      Temp 09/25/20 1501 98.1 F (36.7 C)     Temp Source 09/25/20 1501 Oral     SpO2 09/25/20 1501 98 %     Weight --      Height --      Head Circumference --      Peak Flow --      Pain Score 09/25/20 1459 10     Pain Loc --      Pain Edu? --      Excl. in [place]? --    No data found.  Updated Vital Signs BP (!) 78/54 (BP Location: Left Arm)   Pulse (!) 124   Temp 98.1 F (36.7 C) (Oral)   LMP  (LMP Unknown)   SpO2 98%   Visual Acuity Right Eye Distance:   Left  Eye Distance:   Bilateral Distance:    Right Eye Near:   Left Eye Near:    Bilateral Near:     Physical Exam Vitals and nursing note reviewed.  Constitutional:      Appearance: Normal appearance.     Comments: Lethargic Cachectic, sitting leaned back in wheelchair-unclear baseline ambulation status  HENT:     Head: Atraumatic.     Mouth/Throat:     Mouth: Mucous membranes are dry.  Eyes:     Conjunctiva/sclera: Conjunctivae normal.  Cardiovascular:     Rate and Rhythm: Tachycardia present.  Pulmonary:     Comments: Mildly tachypneic at rest, shallow respirations, lungs clear to auscultation bilaterally Abdominal:     Comments: Hyperactive bowel sounds, abdomen mildly distended on palpation.  Significant lower abdominal tenderness to palpation with guarding  Musculoskeletal:        General: Normal range of motion.     Cervical back: Normal range of motion and neck supple.  Skin:    General: Skin is warm and dry.  Neurological:     Motor: Weakness present.  Psychiatric:        Mood and Affect: Mood normal.        Thought Content: Thought content normal.        Judgment: Judgment normal.     UC Treatments / Results  Labs (all labs ordered are listed, but only abnormal results are displayed) Labs Reviewed - No data to display  EKG   Radiology No results found.  Procedures Procedures (including critical care time)  Medications Ordered in UC Medications - No data to display  Initial Impression / Assessment and Plan / UC Course  I have reviewed the triage vital signs and the nursing notes.  Pertinent labs & imaging results that were available during my care of the patient were reviewed by me and considered in my medical decision making (see chart for details).     Presenting today with severe lower abdominal pain, significant hypotension, tachycardia, lethargy.  Discussed with patient that her vital signs and complaint is very concerning and she will require a  higher level of care than what we have the resources for in the setting, strongly recommended that she allow [place] to call EMS to transport her to the hospital for further management.  She fervently declines this  at this point and wishes for her family member in the waiting room to transport her via private vehicle.  Again recommended given her unstable vital signs that EMS be called to transport her but she declines.  Family members to transport to emergency department, risks reviewed of doing this.  Final Clinical Impressions(s) / UC Diagnoses   Final diagnoses:  Lower abdominal pain  Hypotension, unspecified hypotension type  Tachycardia   Discharge Instructions   None    ED Prescriptions   None    PDMP not reviewed this encounter.   Volney American, [place] 09/25/20 1537

## 2020-09-25 NOTE — H&P (Signed)
History and Physical    Nicole Browning ACZ:[id number] DOB: 26-Oct-1970 DOA: 09/25/2020  PCP: Kerin Perna, NP  Patient coming from: Home.  Chief Complaint: Abdominal pain with nausea vomiting.  HPI: Nicole Browning is a 50 y.o. female with history of recently diagnosed inflammatory bowel disease during colonoscopy done in April 2022 about 4 months ago presents to the ER because of abdominal pain with nausea vomiting ongoing for the last 4 days.  Denies any blood in the vomitus.  Denies any diarrhea.  Abdominal pain is mostly periumbilical.  Denies fever chills.  Unable to keep anything.  ED Course: In the ER patient was mildly hypotensive initially improved with fluids.  Labs show elevated creatinine from 1.7 in May 2022 in December 2 0.5 with elevated LFTs AST is 133 ALT is 231 and total bilirubin is 0.8.  Patient's UA is concerning for UTI.  Patient had a CT abdomen pelvis done which shows features concerning for sigmoid mass with possible hepatic metastasis.  Patient was started on ceftriaxone IV fluids admitted for further management.  COVID test is pending.  Review of Systems: As per HPI, rest all negative.   Past Medical History:  Diagnosis Date   Anemia    GERD (gastroesophageal reflux disease)    IBS (irritable bowel syndrome)    Perirectal abscess    Protein-calorie malnutrition ([place]) 02/29/2016   Tobacco use    UC (ulcerative colitis) ([place])     Past Surgical History:  Procedure Laterality Date   IRRIGATION AND DEBRIDEMENT ABSCESS N/A 02/24/2016   Procedure: IRRIGATION AND DEBRIDEMENT ABSCESS;  Surgeon: Stark Klein, MD;  Location: [facility];  Service: General;  Laterality: N/A;     reports that she has been smoking cigarettes. She has a 9.24 pack-year smoking history. She has never used smokeless tobacco. She reports current alcohol use of about 2.0 standard drinks of alcohol per week. She reports that she does not use drugs.  Allergies  Allergen Reactions   Bactrim  [Sulfamethoxazole-Trimethoprim] Hives    Immediate body wide hives   Percocet [Oxycodone-Acetaminophen] Nausea Only   Valium [Diazepam] Nausea Only    Family History  Problem Relation Age of Onset   Diabetes Mother    COPD Father    Leukemia Brother    Colon cancer Neg Hx    Colon polyps Neg Hx    Esophageal cancer Neg Hx    Rectal cancer Neg Hx    Stomach cancer Neg Hx     Prior to Admission medications   Medication Sig Start Date End Date Taking? Authorizing Provider  aspirin EC 325 MG tablet Take 325 mg by mouth every 6 (six) hours as needed for moderate pain.   Yes [provider]  bismuth subsalicylate (PEPTO BISMOL) 262 MG/15ML suspension Take 30 mLs by mouth every 6 (six) hours as needed for indigestion.   Yes [provider]  ibuprofen (ADVIL) 200 MG tablet Take 200 mg by mouth every 6 (six) hours as needed for moderate pain.   Yes [provider]  mesalamine (LIALDA) 1.2 g EC tablet Take 1 tablet (1.2 g total) by mouth in the morning and at bedtime. 07/11/20  Yes Noralyn Pick, NP  Multiple Vitamins-Minerals (WOMENS MULTIVITAMIN PO) Take 1 tablet by mouth daily.   Yes [provider]  naproxen sodium (ALEVE) 220 MG tablet Take 220 mg by mouth daily as needed (pain).   Yes [provider]  POTASSIUM PO Take 1 tablet by mouth daily.  Yes [provider]  Vitamin D, Ergocalciferol, (DRISDOL) 1.25 MG (50000 UNIT) CAPS capsule Take 1 capsule (50,000 Units total) by mouth every 7 (seven) days. TAKE ONE CAPSULE BY MOUTH ONCE WEEKLY 07/22/20  Yes Noralyn Pick, NP  VITAMIN E PO Take 1 tablet by mouth daily.   Yes [provider]  doxycycline (VIBRAMYCIN) 100 MG capsule Take 1 capsule (100 mg total) by mouth 2 (two) times daily. Patient not taking: Reported on 09/25/2020 07/09/20   Scot Jun, FNP    Physical Exam: Constitutional: Moderately built and nourished. Vitals:   09/25/20 1647  09/25/20 2058 09/25/20 2145 09/25/20 2322  BP: 94/74 97/68 100/73 116/72  Pulse: (!) 124 (!) 121 (!) 104 (!) 107  Resp: 16 16 18 18   Temp: 98.6 F (37 C)     SpO2: 100% 98% 98% 99%   Eyes: Anicteric no pallor. ENMT: No discharge from the ears eyes nose and mouth. Neck: No mass felt.  No neck rigidity. Respiratory: No rhonchi or crepitations. Cardiovascular: S1-S2 heard. Abdomen: Soft nontender bowel sounds present. Musculoskeletal: No edema. Skin: No rash. Neurologic: Alert awake oriented time place and person.  Moves all extremities. Psychiatric: Appears normal.  Normal affect.   Labs on Admission: I have personally reviewed following labs and imaging studies  CBC: Recent Labs  Lab 09/25/20 1641  WBC 15.0*  HGB 11.7*  HCT 36.5  MCV 83.1  PLT 782   Basic Metabolic Panel: Recent Labs  Lab 09/25/20 1641  NA 132*  K 3.9  CL 94*  CO2 24  GLUCOSE 116*  BUN 43*  CREATININE 2.54*  CALCIUM 8.8*   GFR: CrCl cannot be calculated (Unknown ideal weight.). Liver Function Tests: Recent Labs  Lab 09/25/20 1641  AST 133*  ALT 231*  ALKPHOS 79  BILITOT 0.8  PROT 8.1  ALBUMIN 2.6*   Recent Labs  Lab 09/25/20 1641  LIPASE 22   No results for input(s): AMMONIA in the last 168 hours. Coagulation Profile: No results for input(s): INR, PROTIME in the last 168 hours. Cardiac Enzymes: No results for input(s): CKTOTAL, CKMB, CKMBINDEX, TROPONINI in the last 168 hours. BNP (last 3 results) No results for input(s): PROBNP in the last 8760 hours. HbA1C: No results for input(s): HGBA1C in the last 72 hours. CBG: No results for input(s): GLUCAP in the last 168 hours. Lipid Profile: No results for input(s): CHOL, HDL, LDLCALC, TRIG, CHOLHDL, LDLDIRECT in the last 72 hours. Thyroid Function Tests: No results for input(s): TSH, T4TOTAL, FREET4, T3FREE, THYROIDAB in the last 72 hours. Anemia Panel: No results for input(s): VITAMINB12, FOLATE, FERRITIN, TIBC, IRON,  RETICCTPCT in the last 72 hours. Urine analysis:    Component Value Date/Time   COLORURINE AMBER (A) 09/25/2020 2021   APPEARANCEUR CLOUDY (A) 09/25/2020 2021   LABSPEC 1.021 09/25/2020 2021   PHURINE 5.0 09/25/2020 2021   GLUCOSEU NEGATIVE 09/25/2020 2021   HGBUR MODERATE (A) 09/25/2020 2021   BILIRUBINUR NEGATIVE 09/25/2020 2021   KETONESUR NEGATIVE 09/25/2020 2021   PROTEINUR 100 (A) 09/25/2020 2021   NITRITE NEGATIVE 09/25/2020 2021   LEUKOCYTESUR NEGATIVE 09/25/2020 2021   Sepsis Labs: @LABRCNTIP (procalcitonin:4,lacticidven:4) )No results found for this or any previous visit (from the past 240 hour(s)).   Radiological Exams on Admission: CT ABDOMEN PELVIS WO CONTRAST  Result Date: 09/25/2020 CLINICAL DATA:  Abdominal pain, acute nonlocalized abdominal pain associated with muscle spasms. EXAM: CT ABDOMEN AND PELVIS WITHOUT CONTRAST TECHNIQUE: Multidetector CT imaging of the abdomen and pelvis was performed  following the standard protocol without IV contrast. COMPARISON:  February 24, 2016 FINDINGS: Lower chest: Basilar atelectasis/early consolidation on the RIGHT. No effusion. Hepatobiliary: Diffuse central low attenuation in the liver spanning medial an lateral segment of LEFT hepatic lobe along the fissure for false form ligament measuring up to 9.3 x 4.3 cm in total. This appears either anterior 2 or also involving intrahepatic inferior vena cava tracking towards the confluence of the hepatic veins. No pericholecystic stranding. No additional lesions visualized. Pancreas: Not well visualized. Grossly unremarkable and without signs of inflammation. Spleen: Spleen normal size and contour though with serosal disease suspected along the anterior margin. See below. Adrenals/Urinary Tract: Adrenal glands are normal. Kidneys with smooth contours. No hydronephrosis. Urinary bladder is collapsed. Stomach/Bowel: Gastric thickening is suggested in the area of the gastric antrum with some  perigastric stranding. Mild distension of small bowel loops in the abdomen without signs of gross obstruction. Colonic loops decompressed distal to the transverse colon. Suggestion of low-attenuation focus along the margin of the sigmoid (image 67/3) this is suspicious for serosal implant with an adjacent soft tissue nodule in the sigmoid mesentery (image 66/6) 2.3 x 1.5 cm. Mid sigmoid abnormality as discussed below. Visualized portions of the appendix are normal. Vascular/Lymphatic: Calcified atheromatous plaque, scattered throughout the normal caliber abdominal aorta. Smooth contour of the IVC. Scattered small lymph nodes in the retroperitoneum. None with pathologic enlargement. No gross adenopathy in the abdomen though with ill-defined soft tissue in the porta hepatis that may represent conglomerate adenopathy the largest potentially 13 mm short axis (image 28/3) Scattered lymph nodes adjacent to rectosigmoid colon best seen on coronal image 76, adjacent to either soft tissue implant or large lymph node. Adjacent to the dominant soft tissue nodule adjacent to the colon there is question of colonic thickening from which this area arises. This is at the level of the mid sigmoid colon. Ovaries along the bilateral pelvic sidewalls. Reproductive: Ovaries along the bilateral pelvic sidewalls. No adnexal mass. Uterus unremarkable. Other: While there is no ascites. There is fascial thickening with nodularity, for example on image 28 of series 3 6 mm thickening of the abdominal fascia adjacent to the stomach. Nodularity tracking along the anterior and lateral transversalis and in the lateral conal fascia in the LEFT hemiabdomen. Indistinct appearance of the omental fat raising the question of omental disease. Musculoskeletal: No acute musculoskeletal findings. Signs of sacroiliitis with asymmetric involvement of the RIGHT sacral iliac joint. No destructive bone finding or acute bone process. IMPRESSION: 1. Findings most  suspicious for sigmoid neoplasm with hepatic metastatic disease, peritoneal disease and nodal disease at the porta hepatis. Hepatic MRI or CT with contrast may be helpful as the patient is able for further evaluation. 2. Question of gastric antral thickening.  Areas not well assessed. 3. Signs of sacroiliitis with asymmetric involvement of the RIGHT sacral iliac joint. Correlate with any clinical or laboratory evidence of seronegative spondyloarthropathy. 4. Juxta diaphragmatic airspace disease in the RIGHT chest favored to represent juxta diaphragmatic atelectasis. 5. Aortic atherosclerosis. Electronically Signed   By: Zetta Bills M.D.   On: 09/25/2020 19:04      Assessment/Plan Principal Problem:   ARF (acute renal failure) (HCC) Active Problems:   IDA (iron deficiency anemia)   IBD (inflammatory bowel disease)   Colonic mass    Acute renal failure -creatinine has significantly increased when compared to the one in May 2022 2 months ago.  It was 6.79 now it is 2.54.  Likely from intractable nausea  vomiting.  Hydrate and check metabolic panel. Sigmoid mass with possible metastatic lesions in the liver we will consult GI for further recommendations. Elevated LFTs could be from metastatic lesions.  Follow LFTs.  Follow INR. Recently diagnosed inflammatory bowel disease on Lialda. Iron deficiency anemia hemoglobin appears to be at baseline. Signs of sacroiliitis in the CAT scan.  COVID test is pending.   DVT prophylaxis: SCDs.  Avoiding anticoagulation anticipation of possible need for procedure for sigmoid mass. Code Status: Full code. Family Communication: Discussed with patient. Disposition Plan: To be determined. Consults called: [facility]. Admission status: Observation.   Rise Patience MD [facility] Pager [phone].  If 7PM-7AM, please contact night-coverage [email or web address] Password [email or web address]  09/25/2020, 11:30 PM

## 2020-09-25 NOTE — ED Triage Notes (Signed)
Pt presents POV with c/o RLQ pain.  States she was diagnosed with IBS. States she started having pain since satruday and states she continously has muscle spasms.   States she took ibuprofen and Pepto Bismol for relief.

## 2020-09-25 NOTE — ED Triage Notes (Signed)
Pt states she has not ate in 2 days because of nausea.

## 2020-09-25 NOTE — ED Provider Notes (Signed)
Emergency Medicine Provider Triage Evaluation Note  Nicole Browning , a 50 y.o. female  was evaluated in triage.  Pt complains of abdominal pain, nausea, vomiting x days.  Pain is intermittent.  She describes it as sharp and throbbing.  Urgent care prior to arrival and they sent her here.  She admits to having diarrhea yesterday.  No blood in stool.  She admits to passing flatus today.  Unable to tolerate any p.o. intake, vomited twice today.  Reports that she has a history of IBS and ulcerative colitis.  Denies history of similar pain.  Review of Systems  Positive: Abdominal pain, nausea, emesis, diarrhea Negative: Fever, chills, urinary symptoms, back pain  Physical Exam  BP 94/74   Pulse (!) 124   Temp 98.6 F (37 C)   Resp 16   LMP  (LMP Unknown)   SpO2 100%  Gen:   Awake, no distress   Resp:  Normal effort  MSK:   Moves extremities without difficulty  Other:  Tachycardic.  Diffuse abdominal tenderness.  Medical Decision Making  Medically screening exam initiated at 4:53 PM.  Appropriate orders placed.  Nicole Browning was informed that the remainder of the evaluation will be completed by another provider, this initial triage assessment does not replace that evaluation, and the importance of remaining in the ED until their evaluation is complete.  Abdominal labs and UA ordered as well as CT abdomen pelvis.   Portions of this note were generated with [employer]. Dictation errors may occur despite best attempts at proofreading.    Barrie Folk, PA-C 09/25/20 1655    Daleen Bo, MD 09/25/20 1736

## 2020-09-25 NOTE — ED Triage Notes (Signed)
Pt c/o RLQ pain, diagnosed w IBS during colonoscopy last month. No issues w diagnosis until Saturday, pain began. Pepto & ibuprofen for pain/symptoms. Pt unable to tolerate PO for last 2 days, went to [facility] & sent to ED due to hypotension & tachycardia.  10/10 abd pain, stabbing, sharp.

## 2020-09-26 ENCOUNTER — Other Ambulatory Visit: Payer: Self-pay

## 2020-09-26 ENCOUNTER — Observation Stay (HOSPITAL_COMMUNITY): Payer: Medicaid Other

## 2020-09-26 DIAGNOSIS — R160 Hepatomegaly, not elsewhere classified: Secondary | ICD-10-CM | POA: Diagnosis present

## 2020-09-26 DIAGNOSIS — N179 Acute kidney failure, unspecified: Principal | ICD-10-CM

## 2020-09-26 DIAGNOSIS — K529 Noninfective gastroenteritis and colitis, unspecified: Secondary | ICD-10-CM | POA: Diagnosis not present

## 2020-09-26 DIAGNOSIS — K67 Disorders of peritoneum in infectious diseases classified elsewhere: Secondary | ICD-10-CM | POA: Diagnosis not present

## 2020-09-26 DIAGNOSIS — R109 Unspecified abdominal pain: Secondary | ICD-10-CM | POA: Diagnosis not present

## 2020-09-26 DIAGNOSIS — K750 Abscess of liver: Secondary | ICD-10-CM | POA: Diagnosis present

## 2020-09-26 DIAGNOSIS — Z882 Allergy status to sulfonamides status: Secondary | ICD-10-CM | POA: Diagnosis not present

## 2020-09-26 DIAGNOSIS — Z885 Allergy status to narcotic agent status: Secondary | ICD-10-CM | POA: Diagnosis not present

## 2020-09-26 DIAGNOSIS — R188 Other ascites: Secondary | ICD-10-CM | POA: Diagnosis present

## 2020-09-26 DIAGNOSIS — M461 Sacroiliitis, not elsewhere classified: Secondary | ICD-10-CM | POA: Diagnosis present

## 2020-09-26 DIAGNOSIS — E8809 Other disorders of plasma-protein metabolism, not elsewhere classified: Secondary | ICD-10-CM | POA: Diagnosis not present

## 2020-09-26 DIAGNOSIS — R7881 Bacteremia: Secondary | ICD-10-CM | POA: Diagnosis present

## 2020-09-26 DIAGNOSIS — Z20822 Contact with and (suspected) exposure to covid-19: Secondary | ICD-10-CM | POA: Diagnosis present

## 2020-09-26 DIAGNOSIS — Z79899 Other long term (current) drug therapy: Secondary | ICD-10-CM | POA: Diagnosis not present

## 2020-09-26 DIAGNOSIS — N390 Urinary tract infection, site not specified: Secondary | ICD-10-CM | POA: Diagnosis present

## 2020-09-26 DIAGNOSIS — E871 Hypo-osmolality and hyponatremia: Secondary | ICD-10-CM | POA: Diagnosis present

## 2020-09-26 DIAGNOSIS — E43 Unspecified severe protein-calorie malnutrition: Secondary | ICD-10-CM | POA: Diagnosis present

## 2020-09-26 DIAGNOSIS — K5090 Crohn's disease, unspecified, without complications: Secondary | ICD-10-CM | POA: Diagnosis present

## 2020-09-26 DIAGNOSIS — K219 Gastro-esophageal reflux disease without esophagitis: Secondary | ICD-10-CM | POA: Diagnosis present

## 2020-09-26 DIAGNOSIS — K769 Liver disease, unspecified: Secondary | ICD-10-CM | POA: Diagnosis not present

## 2020-09-26 DIAGNOSIS — D509 Iron deficiency anemia, unspecified: Secondary | ICD-10-CM | POA: Diagnosis present

## 2020-09-26 DIAGNOSIS — K651 Peritoneal abscess: Secondary | ICD-10-CM | POA: Diagnosis present

## 2020-09-26 DIAGNOSIS — K449 Diaphragmatic hernia without obstruction or gangrene: Secondary | ICD-10-CM | POA: Diagnosis present

## 2020-09-26 DIAGNOSIS — F17210 Nicotine dependence, cigarettes, uncomplicated: Secondary | ICD-10-CM | POA: Diagnosis present

## 2020-09-26 DIAGNOSIS — K2960 Other gastritis without bleeding: Secondary | ICD-10-CM | POA: Diagnosis present

## 2020-09-26 DIAGNOSIS — K6389 Other specified diseases of intestine: Secondary | ICD-10-CM | POA: Diagnosis not present

## 2020-09-26 DIAGNOSIS — D500 Iron deficiency anemia secondary to blood loss (chronic): Secondary | ICD-10-CM | POA: Diagnosis not present

## 2020-09-26 DIAGNOSIS — K639 Disease of intestine, unspecified: Secondary | ICD-10-CM | POA: Diagnosis not present

## 2020-09-26 DIAGNOSIS — B955 Unspecified streptococcus as the cause of diseases classified elsewhere: Secondary | ICD-10-CM | POA: Diagnosis present

## 2020-09-26 DIAGNOSIS — K2970 Gastritis, unspecified, without bleeding: Secondary | ICD-10-CM | POA: Diagnosis not present

## 2020-09-26 DIAGNOSIS — K2210 Ulcer of esophagus without bleeding: Secondary | ICD-10-CM | POA: Diagnosis present

## 2020-09-26 DIAGNOSIS — Z7982 Long term (current) use of aspirin: Secondary | ICD-10-CM | POA: Diagnosis not present

## 2020-09-26 DIAGNOSIS — R1010 Upper abdominal pain, unspecified: Secondary | ICD-10-CM | POA: Diagnosis not present

## 2020-09-26 LAB — HEPATIC FUNCTION PANEL
ALT: 186 U/L — ABNORMAL HIGH (ref 0–44)
AST: 116 U/L — ABNORMAL HIGH (ref 15–41)
Albumin: 2 g/dL — ABNORMAL LOW (ref 3.5–5.0)
Alkaline Phosphatase: 62 U/L (ref 38–126)
Bilirubin, Direct: 0.3 mg/dL — ABNORMAL HIGH (ref 0.0–0.2)
Indirect Bilirubin: 0.5 mg/dL (ref 0.3–0.9)
Total Bilirubin: 0.8 mg/dL (ref 0.3–1.2)
Total Protein: 6.2 g/dL — ABNORMAL LOW (ref 6.5–8.1)

## 2020-09-26 LAB — CBC WITH DIFFERENTIAL/PLATELET
Abs Immature Granulocytes: 0.15 10*3/uL — ABNORMAL HIGH (ref 0.00–0.07)
Basophils Absolute: 0.1 10*3/uL (ref 0.0–0.1)
Basophils Relative: 0 %
Eosinophils Absolute: 0 10*3/uL (ref 0.0–0.5)
Eosinophils Relative: 0 %
HCT: 29.4 % — ABNORMAL LOW (ref 36.0–46.0)
Hemoglobin: 9.7 g/dL — ABNORMAL LOW (ref 12.0–15.0)
Immature Granulocytes: 1 %
Lymphocytes Relative: 7 %
Lymphs Abs: 0.9 10*3/uL (ref 0.7–4.0)
MCH: 27.6 pg (ref 26.0–34.0)
MCHC: 33 g/dL (ref 30.0–36.0)
MCV: 83.5 fL (ref 80.0–100.0)
Monocytes Absolute: 1 10*3/uL (ref 0.1–1.0)
Monocytes Relative: 8 %
Neutro Abs: 10.6 10*3/uL — ABNORMAL HIGH (ref 1.7–7.7)
Neutrophils Relative %: 84 %
Platelets: 240 10*3/uL (ref 150–400)
RBC: 3.52 MIL/uL — ABNORMAL LOW (ref 3.87–5.11)
RDW: 15.9 % — ABNORMAL HIGH (ref 11.5–15.5)
WBC: 12.7 10*3/uL — ABNORMAL HIGH (ref 4.0–10.5)
nRBC: 0 % (ref 0.0–0.2)

## 2020-09-26 LAB — RESP PANEL BY RT-PCR (FLU A&B, COVID) ARPGX2
Influenza A by PCR: NEGATIVE
Influenza B by PCR: NEGATIVE
SARS Coronavirus 2 by RT PCR: NEGATIVE

## 2020-09-26 LAB — LACTIC ACID, PLASMA: Lactic Acid, Venous: 1.1 mmol/L (ref 0.5–1.9)

## 2020-09-26 LAB — BASIC METABOLIC PANEL
Anion gap: 11 (ref 5–15)
BUN: 44 mg/dL — ABNORMAL HIGH (ref 6–20)
CO2: 20 mmol/L — ABNORMAL LOW (ref 22–32)
Calcium: 8.1 mg/dL — ABNORMAL LOW (ref 8.9–10.3)
Chloride: 100 mmol/L (ref 98–111)
Creatinine, Ser: 1.52 mg/dL — ABNORMAL HIGH (ref 0.44–1.00)
GFR, Estimated: 42 mL/min — ABNORMAL LOW (ref >60)
Glucose, Bld: 108 mg/dL — ABNORMAL HIGH (ref 70–99)
Potassium: 3.9 mmol/L (ref 3.5–5.1)
Sodium: 131 mmol/L — ABNORMAL LOW (ref 135–145)

## 2020-09-26 LAB — C-REACTIVE PROTEIN: CRP: 38.1 mg/dL — ABNORMAL HIGH (ref <1.0)

## 2020-09-26 LAB — HIV ANTIBODY (ROUTINE TESTING W REFLEX): HIV Screen 4th Generation wRfx: NONREACTIVE

## 2020-09-26 LAB — PROCALCITONIN: Procalcitonin: 15.92 ng/mL

## 2020-09-26 IMAGING — MR MR ABDOMEN W/O CM
3 series · 37 of 48 positions shown · non-contrast
Comparison: [DATE] CT abdomen/pelvis.
COMPARISON: [DATE] CT abdomen/pelvis.

Addendum:
CLINICAL DATA: Crohn disease. Indeterminate liver masses on
unenhanced CT from earlier today.

EXAM:
MRI ABDOMEN WITHOUT CONTRAST
TECHNIQUE: Multiplanar multisequence MR imaging was performed without the
administration of intravenous contrast. Patient unable to tolerate
full exam or IV contrast. Only axial and coronal T2 weighted imaging
obtained.

[Series 14: cor haste · coronal · 6.0mm · 1.12mm/px · 14 of 30 slices shown]
[im 1/30]
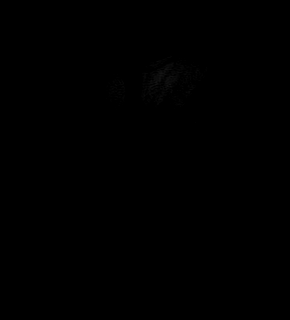
[im 3/30]
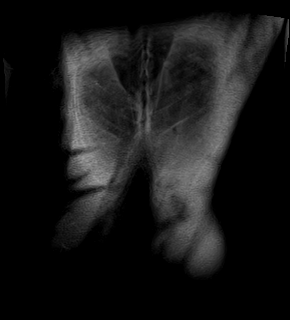
[im 5/30]
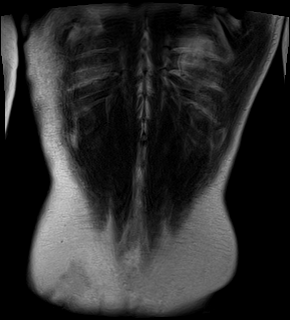
[im 7/30]
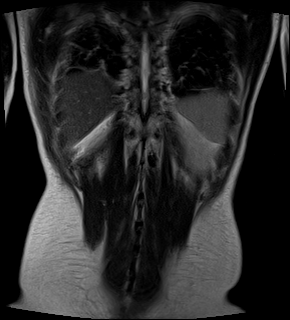
[im 9/30]
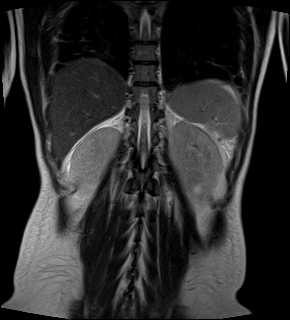
[im 12/30]
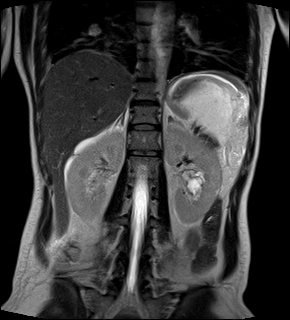
[im 14/30]
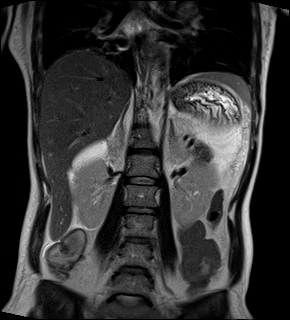
[im 16/30]
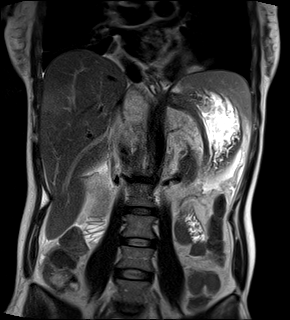
[im 18/30]
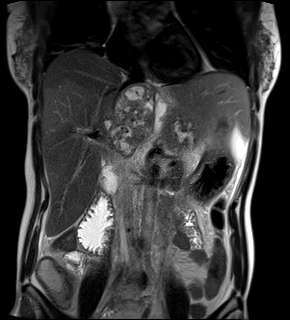
[im 21/30]
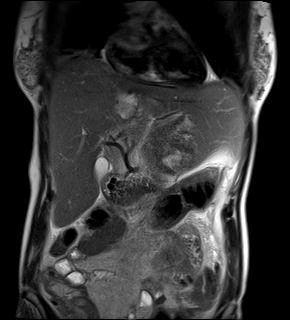
[im 23/30]
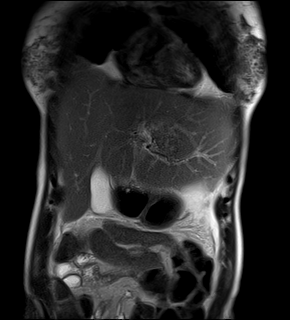
[im 25/30]
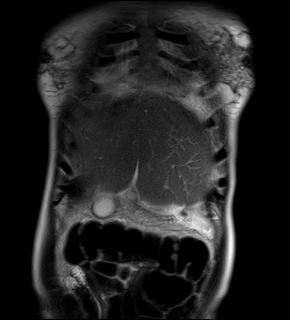
[im 27/30]
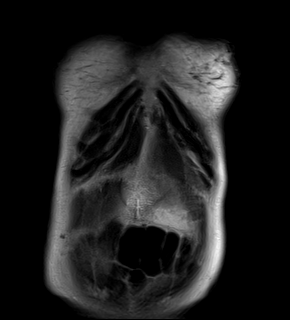
[im 30/30]
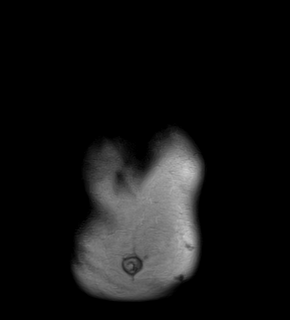

[Series 15: ax haste · axial · 6.0mm · 1.19mm/px · z∈[-139,+106]mm · 12 of 38 slices shown]
[im 1/38]
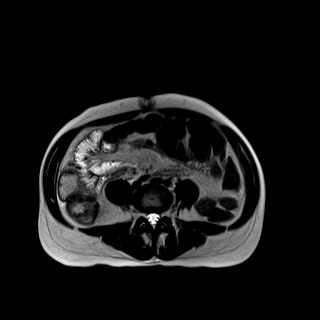
[im 3/38]
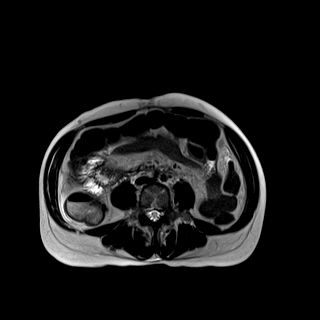
[im 5/38]
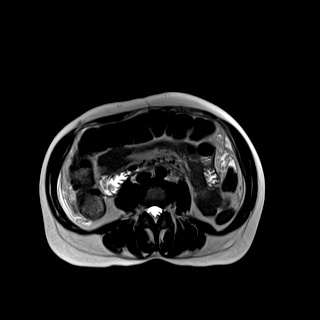
[im 7/38]
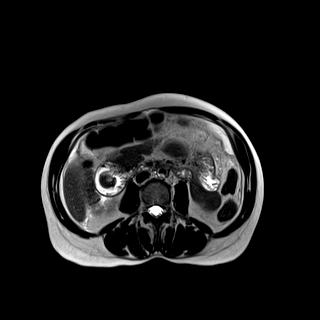
[im 12/38]
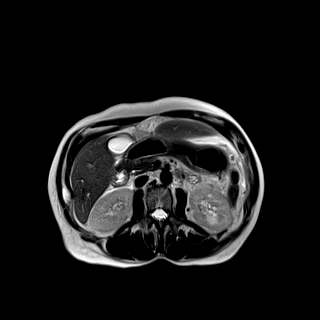
[im 17/38]
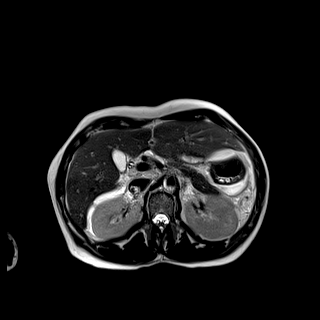
[im 19/38]
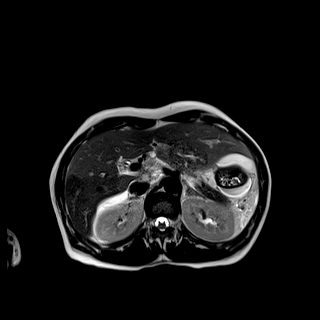
[im 21/38]
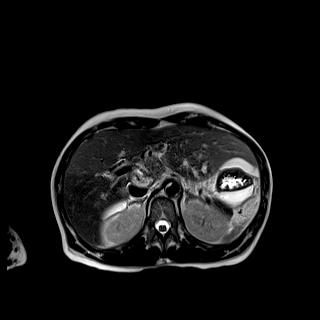
[im 26/38]
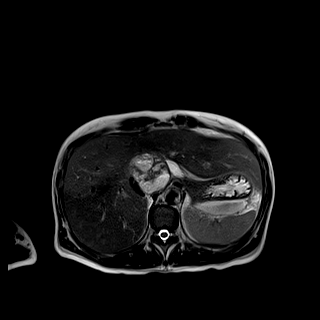
[im 31/38]
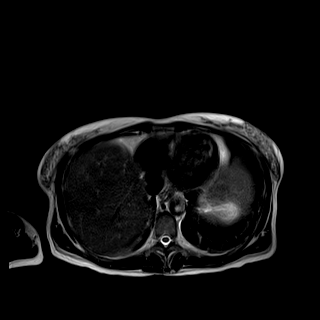
[im 33/38]
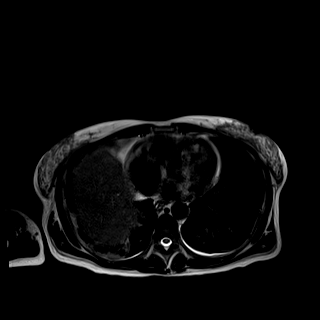
[im 35/38]
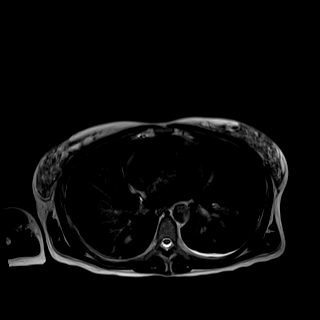

[Series 16: T2 fat-sat · axial · 6.0mm · 1.19mm/px · z∈[-119,+112]mm · 11 of 38 slices shown]
[im 3/38]
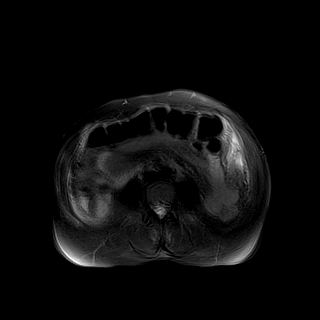
[im 5/38]
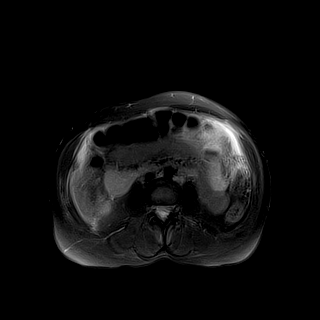
[im 7/38]
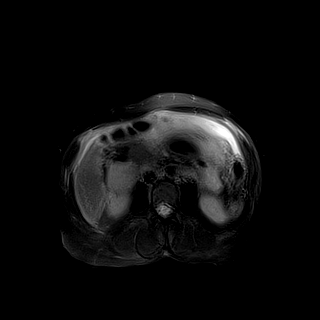
[im 12/38]
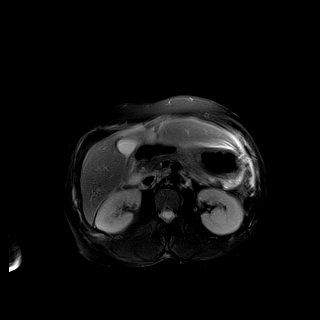
[im 17/38]
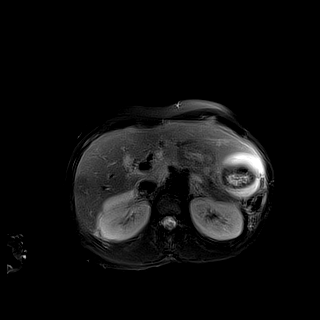
[im 19/38]
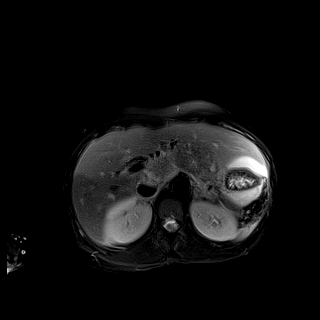
[im 21/38]
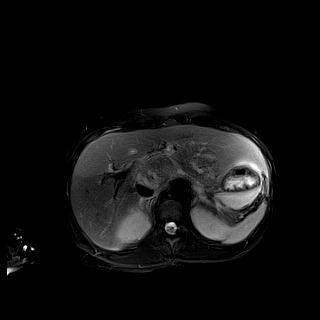
[im 26/38]
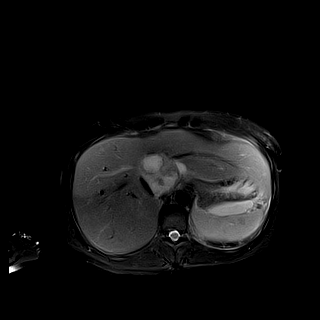
[im 31/38]
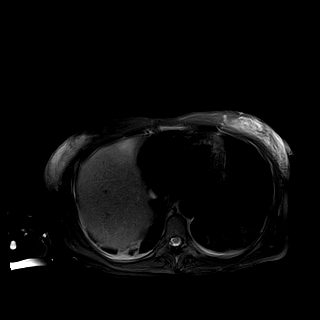
[im 33/38]
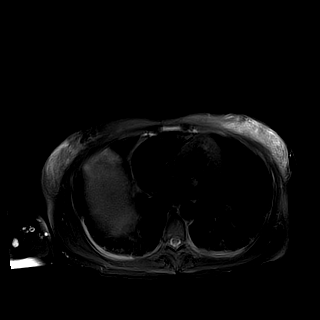
[im 35/38]
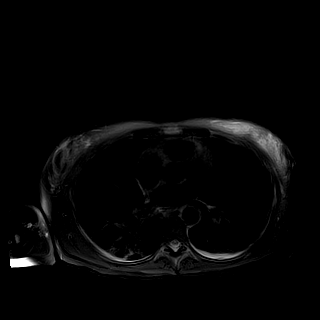

[37 of 48 positions shown; findings below may reference images not displayed]

FINDINGS: Motion degraded incomplete MRI abdomen study performed without IV
contrast.

Lower chest: Streaky curvilinear opacities at the dependent right
greater than left lung bases bilaterally, not well evaluated by MRI.

Hepatobiliary: Similar heterogeneous T2 signal intensity liver
masses measuring 5.6 x 4.7 cm in the caudate lobe (series 15/image
16) and 5.3 x 4.0 cm in the posterior left liver (series 15/image
19). Normal gallbladder with no cholelithiasis. Mild segmental
intrahepatic biliary ductal dilatation in the lateral segment left
liver lobe. No significant intrahepatic biliary ductal dilatation in
the right liver. Common bile duct diameter 3 mm. No
choledocholithiasis. No appreciable biliary masses or strictures.

Pancreas: No pancreatic mass or duct dilation.  No pancreas divisum.

Spleen: Normal size. No mass.

Adrenals/Urinary Tract: Normal adrenals. No hydronephrosis. Simple
appearing homogeneous T2 hyperintense left renal cysts, largest
cm in the lower left kidney. No overtly suspicious renal masses.

Stomach/Bowel: Normal non-distended stomach. Visualized small and
large bowel is normal caliber, with no bowel wall thickening.

Vascular/Lymphatic: Nonaneurysmal abdominal aorta. No pathologically
enlarged lymph nodes in the abdomen.

Other: Trace abdominal ascites. Scattered mild generalized
peritoneal thickening, for example in the right pericolic gutter
(series 15/image 34) and left upper quadrant (series 15/image 25).
No focal drainable fluid collection.

Musculoskeletal: No aggressive appearing focal osseous lesions.
IMPRESSION: 1. Limited motion degraded incomplete MRI abdomen study performed
without IV contrast, discontinued prior to study completion at the
patient's request.
2. Two similar indeterminate heterogeneous liver masses measuring
5.6 cm in the caudate lobe and 5.3 cm in the posterior left liver,
incompletely evaluated. Metastatic disease not excluded. Consider IR
consultation for potential ultrasound-guided liver biopsy. If
clinically feasible, liver protocol CT abdomen and pelvis without
and with IV contrast may be considered for further evaluation prior
to potential biopsy.
3. Mild generalized peritoneal thickening. Trace abdominal ascites.
These findings are nonspecific but raise concern for peritoneal
carcinomatosis, as described on the noncontrast CT study from 1 day
prior.
4. Streaky curvilinear opacities at the dependent right greater than
left lung bases, not well evaluated by MRI. Chest CT may be obtained
for further evaluation as clinically warranted.

ADDENDUM:
These results were called by telephone at the time of interpretation
on [DATE] at [DATE] to provider DR. PEDDIE, who verbally
acknowledged these results.

Upon further discussion with Dr. PEDDIE, the patient is exhibiting
clinical findings of sepsis with an elevated WBC count. The
differential for the liver masses includes liver abscesses in
addition to metastatic disease. Additionally the differential for
the peritoneal thickening includes infectious peritonitis in
addition to carcinomatosis. IR consultation for consideration of
percutaneous biopsy/drainage of these liver lesions is suggested.

*** End of Addendum ***
FINDINGS: Motion degraded incomplete MRI abdomen study performed without IV
contrast.

Lower chest: Streaky curvilinear opacities at the dependent right
greater than left lung bases bilaterally, not well evaluated by MRI.

Hepatobiliary: Similar heterogeneous T2 signal intensity liver
masses measuring 5.6 x 4.7 cm in the caudate lobe (series 15/image
16) and 5.3 x 4.0 cm in the posterior left liver (series 15/image
19). Normal gallbladder with no cholelithiasis. Mild segmental
intrahepatic biliary ductal dilatation in the lateral segment left
liver lobe. No significant intrahepatic biliary ductal dilatation in
the right liver. Common bile duct diameter 3 mm. No
choledocholithiasis. No appreciable biliary masses or strictures.

Pancreas: No pancreatic mass or duct dilation.  No pancreas divisum.

Spleen: Normal size. No mass.

Adrenals/Urinary Tract: Normal adrenals. No hydronephrosis. Simple
appearing homogeneous T2 hyperintense left renal cysts, largest
cm in the lower left kidney. No overtly suspicious renal masses.

Stomach/Bowel: Normal non-distended stomach. Visualized small and
large bowel is normal caliber, with no bowel wall thickening.

Vascular/Lymphatic: Nonaneurysmal abdominal aorta. No pathologically
enlarged lymph nodes in the abdomen.

Other: Trace abdominal ascites. Scattered mild generalized
peritoneal thickening, for example in the right pericolic gutter
(series 15/image 34) and left upper quadrant (series 15/image 25).
No focal drainable fluid collection.

Musculoskeletal: No aggressive appearing focal osseous lesions.
IMPRESSION: 1. Limited motion degraded incomplete MRI abdomen study performed
without IV contrast, discontinued prior to study completion at the
patient's request.
2. Two similar indeterminate heterogeneous liver masses measuring
5.6 cm in the caudate lobe and 5.3 cm in the posterior left liver,
incompletely evaluated. Metastatic disease not excluded. Consider IR
consultation for potential ultrasound-guided liver biopsy. If
clinically feasible, liver protocol CT abdomen and pelvis without
and with IV contrast may be considered for further evaluation prior
to potential biopsy.
3. Mild generalized peritoneal thickening. Trace abdominal ascites.
These findings are nonspecific but raise concern for peritoneal
carcinomatosis, as described on the noncontrast CT study from 1 day
prior.
4. Streaky curvilinear opacities at the dependent right greater than
left lung bases, not well evaluated by MRI. Chest CT may be obtained
for further evaluation as clinically warranted.

## 2020-09-26 IMAGING — MR MR PELVIS W/O CM
6 of 7 series · 36 of 48 positions shown · IV contrast (agent unspecified)
Comparison: [DATE] MRI pelvis.  [DATE] CT abdomen/pelvis.
COMPARISON: [DATE] MRI pelvis.  [DATE] CT abdomen/pelvis.

Addendum:
CLINICAL DATA: 58-year-old female with acute abdominal pain.
Indeterminate perisigmoid masses and liver masses on noncontrast CT
from 1 day prior. By report, no sigmoid mass identified on
colonoscopy performed in [DATE].

EXAM:
MRI PELVIS WITHOUT CONTRAST
TECHNIQUE: Multiplanar multisequence MR imaging of the pelvis was performed. No
intravenous contrast was administered. Study discontinued early at
the patient's request, with intravenous contrast not administered
and postcontrast sequences not obtained.

[Series 3: sag tse trig · sagittal · 5.0mm · 0.75mm/px · 6 of 33 slices shown]
[im 1/33]
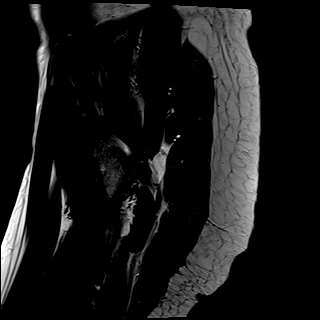
[im 7/33]
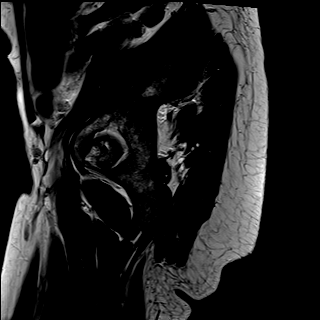
[im 13/33]
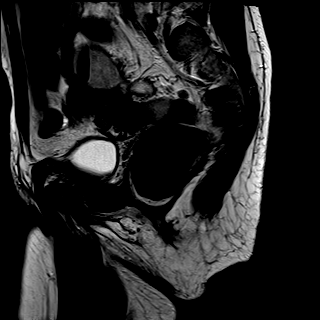
[im 20/33]
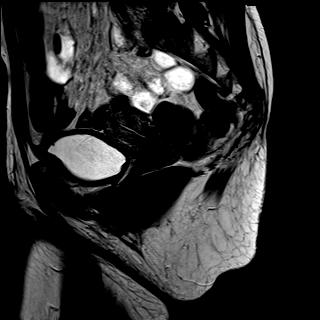
[im 26/33]
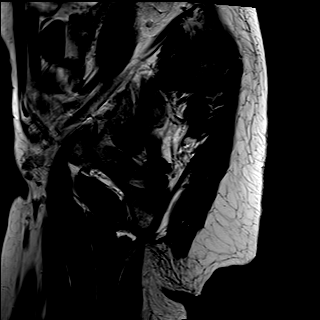
[im 33/33]
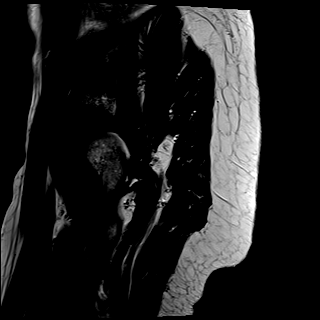

[Series 4: sag tse fs · sagittal · 5.0mm · 0.75mm/px · 7 of 33 slices shown]
[im 1/33]
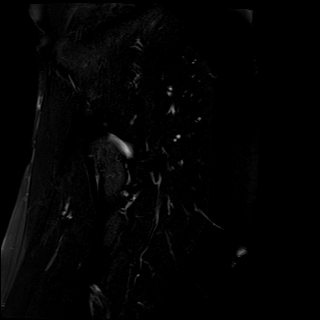
[im 6/33]
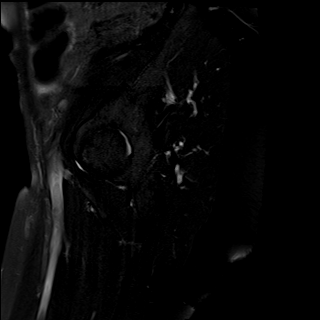
[im 11/33]
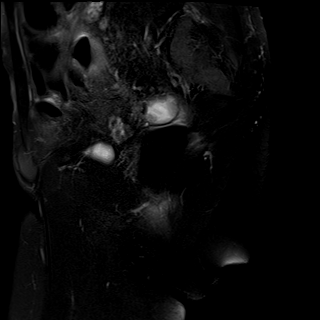
[im 17/33]
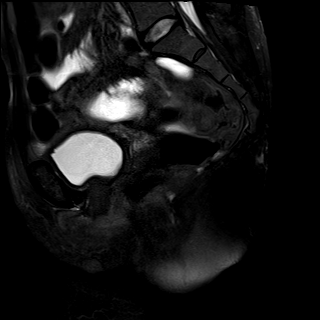
[im 22/33]
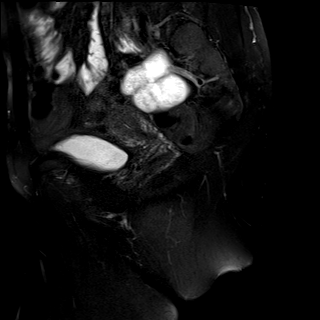
[im 27/33]
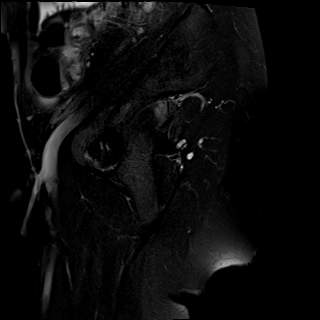
[im 33/33]
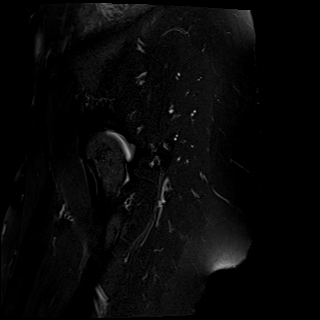

[Series 5: ax tse fs · axial · 5.0mm · 0.69mm/px · z∈[-429,-260]mm · 7 of 30 slices shown]
[im 1/30]
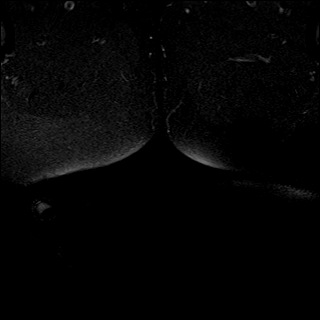
[im 5/30]
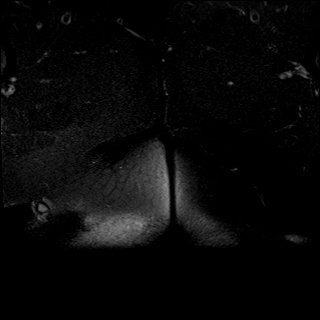
[im 10/30]
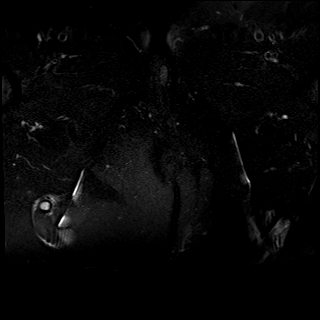
[im 15/30]
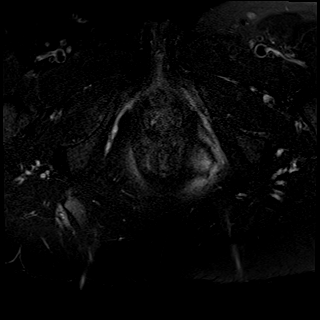
[im 20/30]
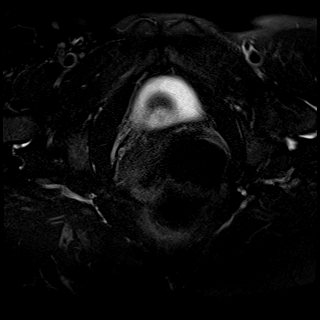
[im 25/30]
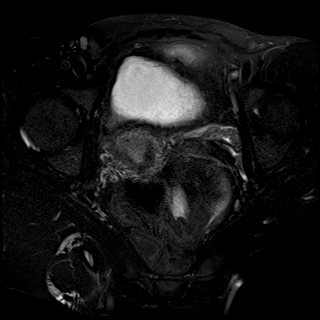
[im 30/30]
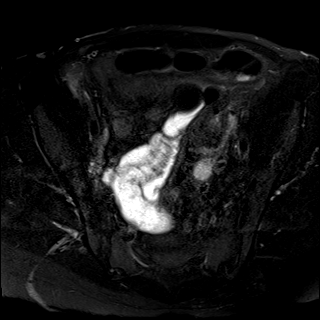

[Series 6: ax tse trig · axial · 5.0mm · 0.69mm/px · z∈[-429,-260]mm · 7 of 30 slices shown]
[im 1/30]
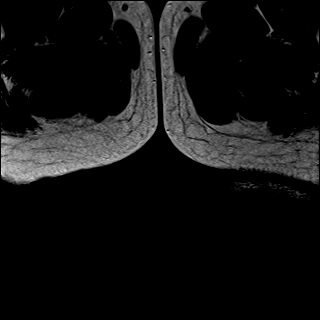
[im 5/30]
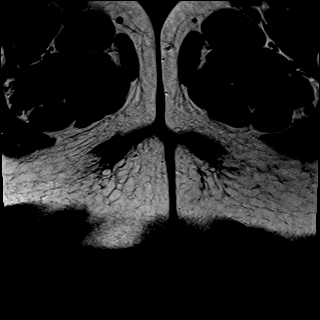
[im 10/30]
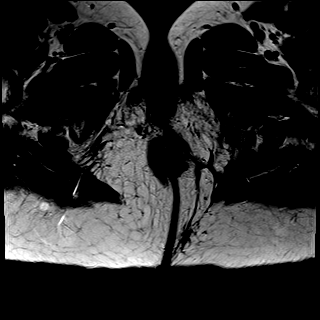
[im 15/30]
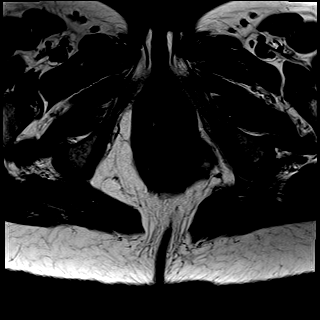
[im 20/30]
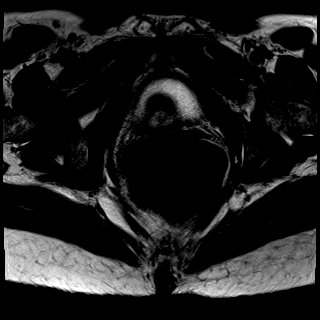
[im 25/30]
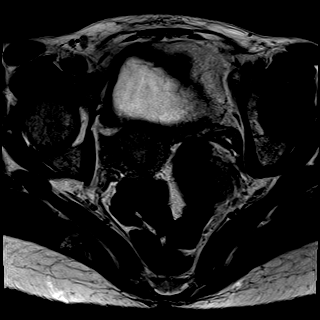
[im 30/30]
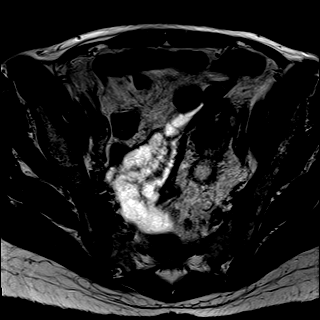

[Series 7: T2 · coronal · 5.0mm · 0.94mm/px · 7 of 32 slices shown]
[im 1/32]
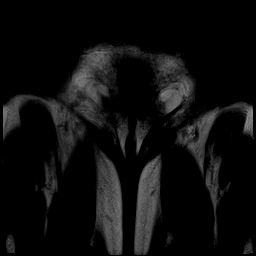
[im 6/32]
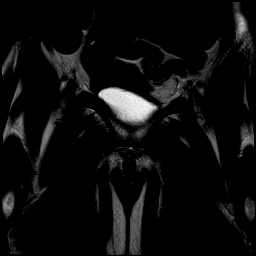
[im 11/32]
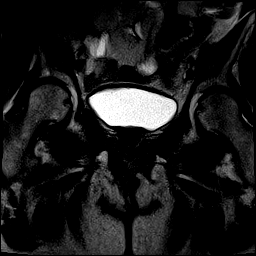
[im 16/32]
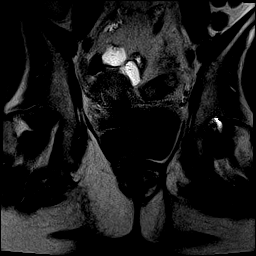
[im 21/32]
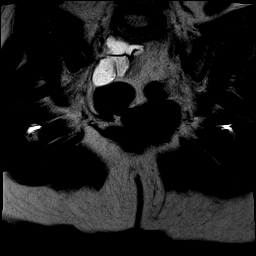
[im 26/32]
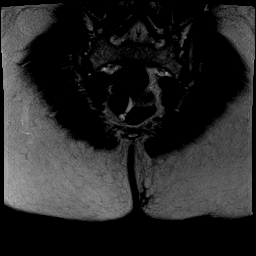
[im 32/32]
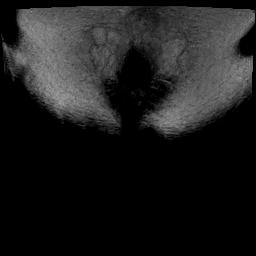

[Series 8: T1 · axial · 5.0mm · 0.43mm/px · z∈[-429,-406]mm · 2 of 30 slices shown]
[im 1/30]
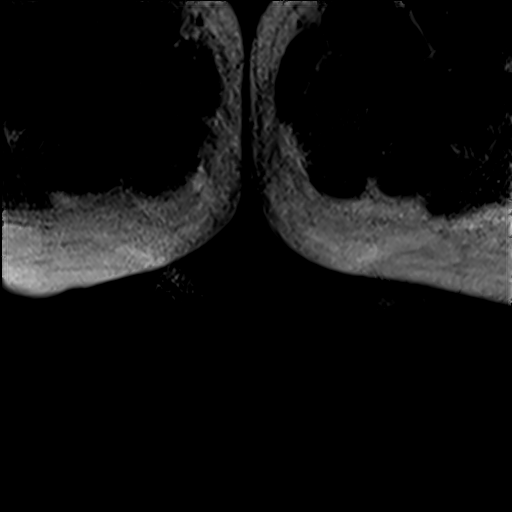
[im 5/30]
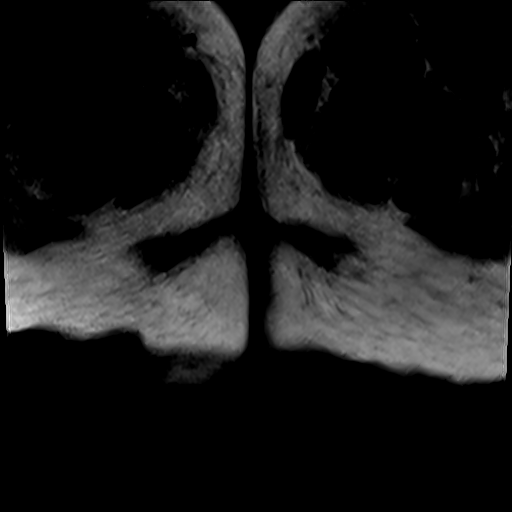

[36 of 48 positions shown; findings below may reference images not displayed]

FINDINGS: Urinary Tract:  Normal bladder and urethra.

Bowel: There is a 3.0 x 2.1 x 2.5 cm mildly T2 hyperintense mass in
the posterior left pelvis adherent to the serosa of the sigmoid
colon (series 5/image 4), with the suggestion of a tiny amount of
nondependent internal gas. There is a separate nearby 2.2 x 1.7 x
1.3 cm T2 hyperintense mass in the sigmoid mesentery (series 5/image
1). There is a separate more anteriorly located thick walled 4.3 x
2.9 x 2.8 cm collection adherent to the sigmoid serosa with
air-fluid level (series 3/image 21). There is mild wall thickening
of the mid sigmoid colon.

Vascular/Lymphatic: No appreciable acute vascular abnormality on
this noncontrast scan. No pathologically enlarged inguinal nodes.

Reproductive: Normal size anteverted uterus measuring 6.2 x 3.1 x
3.9 cm. No uterine fibroids. Thin endometrium (2 mm thickness) with
no discrete endometrial mass and no endometrial cavity fluid. No
discrete mass of the uterine cervix, noting this study is not
tailored for evaluation of the uterus. The ovaries appear symmetric
and small (series 6/image 3 on the right and image 5 on the left).

Other: Mild thickening of the bilateral pelvic peritoneum (series
7/image 13 on the right and image 14 on the left). No pelvic
ascites.

Musculoskeletal: No aggressive appearing focal osseous lesions.
IMPRESSION: 1. Limited noncontrast MRI pelvis study, discontinued early at the
patient's request.
2. Thick-walled 4.3 x 2.9 x 2.8 cm collection adherent to the
proximal sigmoid serosa in the left anterior pelvis with air-fluid
level. Separate 3.0 x 2.1 x 2.5 cm T2 hyperintense mass in the
posterior left pelvis adherent to the sigmoid serosa with suggestion
of a tiny amount of nondependent gas. Separate 2.2 cm T2
hyperintense mass in the sigmoid mesentery. These findings are
indeterminate, with differential including gas-containing pelvic
abscesses versus peritoneal metastatic disease of uncertain primary.
Fistulous communication to the sigmoid colon not excluded. Surgical
consultation suggested.
3. Mild thickening of the bilateral pelvic peritoneum, nonspecific,
differential includes peritonitis vs. peritoneal carcinomatosis.

ADDENDUM:
These results were called by telephone at the time of interpretation
on [DATE] at [DATE] to provider hospitalist DR. COZARI, who
verbally acknowledged these results.

*** End of Addendum ***
FINDINGS: Urinary Tract:  Normal bladder and urethra.

Bowel: There is a 3.0 x 2.1 x 2.5 cm mildly T2 hyperintense mass in
the posterior left pelvis adherent to the serosa of the sigmoid
colon (series 5/image 4), with the suggestion of a tiny amount of
nondependent internal gas. There is a separate nearby 2.2 x 1.7 x
1.3 cm T2 hyperintense mass in the sigmoid mesentery (series 5/image
1). There is a separate more anteriorly located thick walled 4.3 x
2.9 x 2.8 cm collection adherent to the sigmoid serosa with
air-fluid level (series 3/image 21). There is mild wall thickening
of the mid sigmoid colon.

Vascular/Lymphatic: No appreciable acute vascular abnormality on
this noncontrast scan. No pathologically enlarged inguinal nodes.

Reproductive: Normal size anteverted uterus measuring 6.2 x 3.1 x
3.9 cm. No uterine fibroids. Thin endometrium (2 mm thickness) with
no discrete endometrial mass and no endometrial cavity fluid. No
discrete mass of the uterine cervix, noting this study is not
tailored for evaluation of the uterus. The ovaries appear symmetric
and small (series 6/image 3 on the right and image 5 on the left).

Other: Mild thickening of the bilateral pelvic peritoneum (series
7/image 13 on the right and image 14 on the left). No pelvic
ascites.

Musculoskeletal: No aggressive appearing focal osseous lesions.
IMPRESSION: 1. Limited noncontrast MRI pelvis study, discontinued early at the
patient's request.
2. Thick-walled 4.3 x 2.9 x 2.8 cm collection adherent to the
proximal sigmoid serosa in the left anterior pelvis with air-fluid
level. Separate 3.0 x 2.1 x 2.5 cm T2 hyperintense mass in the
posterior left pelvis adherent to the sigmoid serosa with suggestion
of a tiny amount of nondependent gas. Separate 2.2 cm T2
hyperintense mass in the sigmoid mesentery. These findings are
indeterminate, with differential including gas-containing pelvic
abscesses versus peritoneal metastatic disease of uncertain primary.
Fistulous communication to the sigmoid colon not excluded. Surgical
consultation suggested.
3. Mild thickening of the bilateral pelvic peritoneum, nonspecific,
differential includes peritonitis vs. peritoneal carcinomatosis.

## 2020-09-26 NOTE — ED Notes (Signed)
Pt made aware of NPO status for MRI, last po intake 0900

## 2020-09-26 NOTE — ED Notes (Signed)
Ambulated to bathroom with steady gait

## 2020-09-26 NOTE — ED Notes (Signed)
Pt to MRI

## 2020-09-26 NOTE — ED Notes (Signed)
Pt ambulated with a steady gait to restroom. Given crackers and water. Updated on plan of care

## 2020-09-26 NOTE — ED Notes (Signed)
Returned from MRI 

## 2020-09-26 NOTE — ED Notes (Signed)
Rounding provider at bedside

## 2020-09-26 NOTE — ED Provider Notes (Signed)
[facility] DEPART[facility] Provider Note   CSN: [id number] Arrival date & time: 09/25/20  1521     History Chief Complaint  Patient presents with   Abdominal Pain   Hypotension    Nicole Browning is a 50 y.o. female.  Nicole Browning is a 50 y.o. female with history of ulcerative colitis, GERD, anemia, perirectal abscess, presents to the emergency department from urgent care for evaluation of abdominal pain, nausea and vomiting.  Symptoms have been present over the past 2 days.  Patient reports generalized pain that is not localized to 1 area.  She reports she has not been able to keep anything down.  Pain seems to intermittently worsen and becomes sharp and throbbing.  Patient reports 1 episode of diarrhea yesterday, no blood in the stool.  Reports she is passing gas normally today but has not been able to keep anything down and has vomited twice, no hematemesis.  Reports she is feeling a bit weak today.  Patient had recent colonoscopy with [facility] in April and was diagnosed with ulcerative colitis and has been started on mesalamine, previously had frequent rectal bleeding but reports this has been significantly improved since starting this medicine, and she has not seen any bleeding in the past 2 days.  No fevers or chills.  Patient has had some urinary frequency but no dysuria, no flank pain.  The history is provided by the patient and medical records.      Past Medical History:  Diagnosis Date   Anemia    GERD (gastroesophageal reflux disease)    IBS (irritable bowel syndrome)    Perirectal abscess    Protein-calorie malnutrition ([place]) 02/29/2016   Tobacco use    UC (ulcerative colitis) ([place])     Patient Active Problem List   Diagnosis Date Noted   ARF (acute renal failure) ([place]) 09/25/2020   Colonic mass 09/25/2020   IDA (iron deficiency anemia) 07/24/2020   IBD (inflammatory bowel disease) 07/24/2020   Protein-calorie malnutrition ([place])  02/29/2016   Elevated hemoglobin A1c 02/27/2016   GERD (gastroesophageal reflux disease)    Tobacco use    Anemia of chronic disease    Prediabetes    Perirectal abscess 02/24/2016    Past Surgical History:  Procedure Laterality Date   IRRIGATION AND DEBRIDEMENT ABSCESS N/A 02/24/2016   Procedure: IRRIGATION AND DEBRIDEMENT ABSCESS;  Surgeon: Stark Klein, MD;  Location: [facility] OR;  Service: General;  Laterality: N/A;     OB History   No obstetric history on file.     Family History  Problem Relation Age of Onset   Diabetes Mother    COPD Father    Leukemia Brother    Colon cancer Neg Hx    Colon polyps Neg Hx    Esophageal cancer Neg Hx    Rectal cancer Neg Hx    Stomach cancer Neg Hx     Social History   Tobacco Use   Smoking status: Every Day    Packs/day: 0.33    Years: 28.00    Pack years: 9.24    Types: Cigarettes   Smokeless tobacco: Never  Vaping Use   Vaping Use: Never used  Substance Use Topics   Alcohol use: Yes    Alcohol/week: 2.0 standard drinks    Types: 2 Glasses of wine per week   Drug use: No    Home Medications Prior to Admission medications   Medication Sig Start Date End Date Taking? Authorizing Provider  aspirin EC  325 MG tablet Take 325 mg by mouth every 6 (six) hours as needed for moderate pain.   Yes [provider]  bismuth subsalicylate (PEPTO BISMOL) 262 MG/15ML suspension Take 30 mLs by mouth every 6 (six) hours as needed for indigestion.   Yes [provider]  ibuprofen (ADVIL) 200 MG tablet Take 200 mg by mouth every 6 (six) hours as needed for moderate pain.   Yes [provider]  mesalamine (LIALDA) 1.2 g EC tablet Take 1 tablet (1.2 g total) by mouth in the morning and at bedtime. 07/11/20  Yes Noralyn Pick, NP  Multiple Vitamins-Minerals (WOMENS MULTIVITAMIN PO) Take 1 tablet by mouth daily.   Yes [provider]  naproxen sodium (ALEVE) 220 MG tablet Take 220 mg by mouth daily as  needed (pain).   Yes [provider]  POTASSIUM PO Take 1 tablet by mouth daily.   Yes [provider]  Vitamin D, Ergocalciferol, (DRISDOL) 1.25 MG (50000 UNIT) CAPS capsule Take 1 capsule (50,000 Units total) by mouth every 7 (seven) days. TAKE ONE CAPSULE BY MOUTH ONCE WEEKLY 07/22/20  Yes Noralyn Pick, NP  VITAMIN E PO Take 1 tablet by mouth daily.   Yes [provider]  doxycycline (VIBRAMYCIN) 100 MG capsule Take 1 capsule (100 mg total) by mouth 2 (two) times daily. Patient not taking: Reported on 09/25/2020 07/09/20   Scot Jun, FNP    Allergies    Bactrim [sulfamethoxazole-trimethoprim], Percocet [oxycodone-acetaminophen], and Valium [diazepam]  Review of Systems   Review of Systems  Constitutional:  Positive for fatigue. Negative for chills and fever.  HENT: Negative.    Respiratory:  Negative for cough and shortness of breath.   Cardiovascular:  Negative for chest pain.  Gastrointestinal:  Positive for abdominal pain, diarrhea, nausea and vomiting. Negative for blood in stool.  Genitourinary:  Positive for frequency. Negative for decreased urine volume, dysuria, flank pain and vaginal bleeding.  Musculoskeletal:  Negative for arthralgias and myalgias.  Skin:  Negative for color change and rash.  Neurological:  Negative for dizziness, syncope and light-headedness.  All other systems reviewed and are negative.  Physical Exam Updated Vital Signs BP 116/72   Pulse (!) 107   Temp 98.6 F (37 C)   Resp 18   LMP  (LMP Unknown)   SpO2 99%   Physical Exam Vitals and nursing note reviewed.  Constitutional:      General: She is not in acute distress.    Appearance: Normal appearance. She is well-developed. She is not diaphoretic.  HENT:     Head: Normocephalic and atraumatic.     Mouth/Throat:     Comments: Mucous membranes dry Eyes:     General:        Right eye: No discharge.        Left eye: No discharge.     Pupils: Pupils  are equal, round, and reactive to light.  Cardiovascular:     Rate and Rhythm: Regular rhythm. Tachycardia present.     Pulses: Normal pulses.     Heart sounds: Normal heart sounds.     Comments: Mildly tachycardic with regular rhythm Pulmonary:     Effort: Pulmonary effort is normal. No respiratory distress.     Breath sounds: Normal breath sounds. No wheezing or rales.     Comments: Respirations equal and unlabored, patient able to speak in full sentences, lungs clear to auscultation bilaterally  Abdominal:     General: Bowel sounds are normal.  There is no distension.     Palpations: Abdomen is soft. There is no mass.     Tenderness: There is generalized abdominal tenderness. There is no right CVA tenderness, left CVA tenderness or guarding.     Comments: Abdomen is soft, nondistended, bowel sounds present throughout, generalized tenderness noted that does not localize to 1 area.  No guarding or peritoneal signs, no CVA tenderness  Musculoskeletal:        General: No deformity.     Cervical back: Neck supple.  Skin:    General: Skin is warm and dry.     Capillary Refill: Capillary refill takes less than 2 seconds.  Neurological:     Mental Status: She is alert and oriented to person, place, and time.     Coordination: Coordination normal.     Comments: Speech is clear, able to follow commands Moves extremities without ataxia, coordination intact  Psychiatric:        Mood and Affect: Mood normal.        Behavior: Behavior normal.    ED Results / Procedures / Treatments   Labs (all labs ordered are listed, but only abnormal results are displayed) Labs Reviewed  COMPREHENSIVE METABOLIC PANEL - Abnormal; Notable for the following components:      Result Value   Sodium 132 (*)    Chloride 94 (*)    Glucose, Bld 116 (*)    BUN 43 (*)    Creatinine, Ser 2.54 (*)    Calcium 8.8 (*)    Albumin 2.6 (*)    AST 133 (*)    ALT 231 (*)    GFR, Estimated 22 (*)    All other  components within normal limits  CBC - Abnormal; Notable for the following components:   WBC 15.0 (*)    Hemoglobin 11.7 (*)    RDW 15.9 (*)    All other components within normal limits  URINALYSIS, ROUTINE W REFLEX MICROSCOPIC - Abnormal; Notable for the following components:   Color, Urine AMBER (*)    APPearance CLOUDY (*)    Hgb urine dipstick MODERATE (*)    Protein, ur 100 (*)    RBC / HPF >50 (*)    Non Squamous Epithelial 0-5 (*)    All other components within normal limits  RESP PANEL BY RT-PCR (FLU A&B, COVID) ARPGX2  URINE CULTURE  SARS CORONAVIRUS 2 (TAT 6-24 HRS)  LIPASE, BLOOD  HIV ANTIBODY (ROUTINE TESTING W REFLEX)  HEPATIC FUNCTION PANEL  CBC WITH DIFFERENTIAL/PLATELET  BASIC METABOLIC PANEL    EKG None  Radiology CT ABDOMEN PELVIS WO CONTRAST  Result Date: 09/25/2020 CLINICAL DATA:  Abdominal pain, acute nonlocalized abdominal pain associated with muscle spasms. EXAM: CT ABDOMEN AND PELVIS WITHOUT CONTRAST TECHNIQUE: Multidetector CT imaging of the abdomen and pelvis was performed following the standard protocol without IV contrast. COMPARISON:  February 24, 2016 FINDINGS: Lower chest: Basilar atelectasis/early consolidation on the RIGHT. No effusion. Hepatobiliary: Diffuse central low attenuation in the liver spanning medial an lateral segment of LEFT hepatic lobe along the fissure for false form ligament measuring up to 9.3 x 4.3 cm in total. This appears either anterior 2 or also involving intrahepatic inferior vena cava tracking towards the confluence of the hepatic veins. No pericholecystic stranding. No additional lesions visualized. Pancreas: Not well visualized. Grossly unremarkable and without signs of inflammation. Spleen: Spleen normal size and contour though with serosal disease suspected along the anterior margin. See below. Adrenals/Urinary Tract: Adrenal glands are normal.  Kidneys with smooth contours. No hydronephrosis. Urinary bladder is collapsed.  Stomach/Bowel: Gastric thickening is suggested in the area of the gastric antrum with some perigastric stranding. Mild distension of small bowel loops in the abdomen without signs of gross obstruction. Colonic loops decompressed distal to the transverse colon. Suggestion of low-attenuation focus along the margin of the sigmoid (image 67/3) this is suspicious for serosal implant with an adjacent soft tissue nodule in the sigmoid mesentery (image 66/6) 2.3 x 1.5 cm. Mid sigmoid abnormality as discussed below. Visualized portions of the appendix are normal. Vascular/Lymphatic: Calcified atheromatous plaque, scattered throughout the normal caliber abdominal aorta. Smooth contour of the IVC. Scattered small lymph nodes in the retroperitoneum. None with pathologic enlargement. No gross adenopathy in the abdomen though with ill-defined soft tissue in the porta hepatis that may represent conglomerate adenopathy the largest potentially 13 mm short axis (image 28/3) Scattered lymph nodes adjacent to rectosigmoid colon best seen on coronal image 76, adjacent to either soft tissue implant or large lymph node. Adjacent to the dominant soft tissue nodule adjacent to the colon there is question of colonic thickening from which this area arises. This is at the level of the mid sigmoid colon. Ovaries along the bilateral pelvic sidewalls. Reproductive: Ovaries along the bilateral pelvic sidewalls. No adnexal mass. Uterus unremarkable. Other: While there is no ascites. There is fascial thickening with nodularity, for example on image 28 of series 3 6 mm thickening of the abdominal fascia adjacent to the stomach. Nodularity tracking along the anterior and lateral transversalis and in the lateral conal fascia in the LEFT hemiabdomen. Indistinct appearance of the omental fat raising the question of omental disease. Musculoskeletal: No acute musculoskeletal findings. Signs of sacroiliitis with asymmetric involvement of the RIGHT sacral  iliac joint. No destructive bone finding or acute bone process. IMPRESSION: 1. 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 Hepatic MRI or CT with contrast may be helpful as the patient is able for further evaluation. 2. Question of gastric antral thickening.  Areas not well assessed. 3. Signs of sacroiliitis with asymmetric involvement of the RIGHT sacral iliac joint. Correlate with any clinical or laboratory evidence of seronegative spondyloarthropathy. 4. Juxta diaphragmatic airspace disease in the RIGHT chest favored to represent juxta diaphragmatic atelectasis. 5. Aortic atherosclerosis. Electronically Signed   By: Zetta Bills M.D.   On: 09/25/2020 19:04    Procedures Procedures   Medications Ordered in ED Medications  mesalamine (LIALDA) EC tablet 1.2 g (has no administration in time range)  lactated ringers infusion ( Intravenous New Bag/Given 09/26/20 0004)  morphine 2 MG/ML injection 0.5 mg (has no administration in time range)  ondansetron (ZOFRAN) tablet 4 mg (has no administration in time range)    Or  ondansetron (ZOFRAN) injection 4 mg (has no administration in time range)  cefTRIAXone (ROCEPHIN) 1 g in sodium chloride 0.9 % 100 mL IVPB (has no administration in time range)  sodium chloride 0.9 % bolus 1,000 mL (0 mLs Intravenous Stopped 09/25/20 2323)  morphine 4 MG/ML injection 4 mg (4 mg Intravenous Given 09/25/20 2216)  ondansetron (ZOFRAN) injection 4 mg (4 mg Intravenous Given 09/25/20 2216)  cefTRIAXone (ROCEPHIN) 1 g in sodium chloride 0.9 % 100 mL IVPB (0 g Intravenous Stopped 09/25/20 2353)    ED Course  I have reviewed the triage vital signs and the nursing notes.  Pertinent labs & imaging results that were available during my care of the  patient were reviewed by me and considered in my medical decision making (see chart for details).    MDM Rules/Calculators/A&P                           50 year old female sent from urgent care for evaluation of abdominal pain, nausea and vomiting over the past 2 days.  Patient was noted to be tachycardic and mildly hypotensive at urgent care.  Labs and CT imaging ordered from triage.  On exam patient has generalized tenderness that does not localize to 1 area.  I have independently ordered, reviewed and interpreted all labs and imaging: CBC: Leukocytosis of 15, stable hemoglobin CMP: Significant AKI, creatinine typically around 0.8, today 2.58 with elevated BUN, patient also with mild transaminitis Lipase: WNL UA: Concerning for infection with RBCs, WBCs and white blood cell clumps.  Will send for culture and treat with Rocephin  CT with findings concerning for sigmoid neoplasm with hepatic metastatic disease, peritoneal disease and nodal disease, question of some gastric antral thickening as well.  Signs of possible sacroiliitis.  Patient had colonoscopy in April and was diagnosed with ulcerative colitis, no signs of cancerous lesions at this time.  Patient will need admission for AKI in the setting of potential urinary tract infection as well as findings concerning for new abdominal mass that will require further work-up.  Case discussed with Dr. Hal Hope with [facility] hospitalist who will see and admit the patient.  Final Clinical Impression(s) / ED Diagnoses Final diagnoses:  AKI (acute kidney injury) ([place])  Acute UTI  Liver lesion  Lesion of colon    Rx / DC Orders ED Discharge Orders     None        Jacqlyn Larsen, PA-C 09/26/20 0246    Lorelle Gibbs, DO 09/26/20 2300

## 2020-09-26 NOTE — Progress Notes (Signed)
Patient ID: Genette Huertas, female   DOB: [birth year], 50 y.o.   MRN: [id number]  PROGRESS NOTE    Sincerity Cedar  DZH:[id number] DOB: [birth year] DOA: 09/25/2020 PCP: Kerin Perna, NP   Brief Narrative:  50 year old female with recent diagnosis of IBD in April 2022 presented with abdominal pain, nausea and vomiting.  On presentation, she was slightly hypotensive which improved with IV fluids.  Creatinine was elevated at 2.54 along with AST of 133, ALT of 231 and total bilirubin of 0.8.  CT of the abdomen and pelvis showed features concerning for sigmoid mass with possible hepatic metastasis.  She was given a dose of IV Rocephin in the ED for possible UTI.  GI was consulted.  Assessment & Plan:   Acute renal failure -Possibly prerenal from dehydration and poor oral intake.   -Presented with creatinine of 2.54; prior creatinine was 0.79 -improving with iv fluids: 1.52 today.  Decrease IV fluids to 100 cc an hour  Possible sigmoid mass with possible hepatic metastases Elevated LFTs -GI has been consulted.  Follow recommendations.  Follow LFTs: Improving  Leukocytosis -Possibly reactive.  Monitor.  Improving  Recently diagnosed IBD -On Lialda.  Iron deficiency anemia -Hemoglobin stable.  Monitor  Hyponatremia -Possibly from poor oral intake.  Monitor.  Signs of sacroiliitis -Seen on CAT scan.    DVT prophylaxis: SCDs. Code Status: Full Family Communication: None at bedside Disposition Plan: Status is: Observation  The patient will require care spanning > 2 midnights and should be moved to inpatient because: Inpatient level of care appropriate due to severity of illness  Dispo: The patient is from: Home              Anticipated d/c is to: Home              Patient currently is not medically stable to d/c.   Difficult to place patient No   Consultants: GI  Procedures: None  Antimicrobials: 1 dose of Rocephin in the ED   Subjective: Patient seen and examined at  bedside.  Still has intermittent abdominal pain.  Currently denies any nausea or vomiting.  No overnight fever, worsening shortness of breath reported.  Objective: Vitals:   09/25/20 2322 09/26/20 0300 09/26/20 0657 09/26/20 0834  BP: 116/72 100/73 98/67 111/69  Pulse: (!) 107 (!) 101 (!) 112 (!) 108  Resp: 18 16 17 16   Temp:      SpO2: 99% 98% 97% 99%   No intake or output data in the 24 hours ending 09/26/20 0956 There were no vitals filed for this visit.  Examination:  General exam: Appears calm and comfortable.  Currently on room air Respiratory system: Bilateral decreased breath sounds at bases Cardiovascular system: S1 & S2 heard, Rate controlled Gastrointestinal system: Abdomen is slightly distended, soft and mildly tender in the lower quadrant.  Normal bowel sounds heard. Extremities: No cyanosis, clubbing, edema  Central nervous system: Alert and oriented. No focal neurological deficits. Moving extremities Skin: No rashes, lesions or ulcers Psychiatry: Affect is mostly flat.    Data Reviewed: I have personally reviewed following labs and imaging studies  CBC: Recent Labs  Lab 09/25/20 1641 09/26/20 0350  WBC 15.0* 12.7*  NEUTROABS  --  10.6*  HGB 11.7* 9.7*  HCT 36.5 29.4*  MCV 83.1 83.5  PLT 319 419   Basic Metabolic Panel: Recent Labs  Lab 09/25/20 1641 09/26/20 0350  NA 132* 131*  K 3.9 3.9  CL 94* 100  CO2 24 20*  GLUCOSE 116* 108*  BUN 43* 44*  CREATININE 2.54* 1.52*  CALCIUM 8.8* 8.1*   GFR: CrCl cannot be calculated (Unknown ideal weight.). Liver Function Tests: Recent Labs  Lab 09/25/20 1641 09/26/20 0350  AST 133* 116*  ALT 231* 186*  ALKPHOS 79 62  BILITOT 0.8 0.8  PROT 8.1 6.2*  ALBUMIN 2.6* 2.0*   Recent Labs  Lab 09/25/20 1641  LIPASE 22   No results for input(s): AMMONIA in the last 168 hours. Coagulation Profile: No results for input(s): INR, PROTIME in the last 168 hours. Cardiac Enzymes: No results for input(s):  CKTOTAL, CKMB, CKMBINDEX, TROPONINI in the last 168 hours. BNP (last 3 results) No results for input(s): PROBNP in the last 8760 hours. HbA1C: No results for input(s): HGBA1C in the last 72 hours. CBG: No results for input(s): GLUCAP in the last 168 hours. Lipid Profile: No results for input(s): CHOL, HDL, LDLCALC, TRIG, CHOLHDL, LDLDIRECT in the last 72 hours. Thyroid Function Tests: No results for input(s): TSH, T4TOTAL, FREET4, T3FREE, THYROIDAB in the last 72 hours. Anemia Panel: No results for input(s): VITAMINB12, FOLATE, FERRITIN, TIBC, IRON, RETICCTPCT in the last 72 hours. Sepsis Labs: No results for input(s): PROCALCITON, LATICACIDVEN in the last 168 hours.  Recent Results (from the past 240 hour(s))  Resp Panel by RT-PCR (Flu A&B, Covid) Nasopharyngeal Swab     Status: None   Collection Time: 09/25/20 10:09 PM   Specimen: Nasopharyngeal Swab; Nasopharyngeal(NP) swabs in vial transport medium  Result Value Ref Range Status   SARS Coronavirus 2 by RT PCR NEGATIVE NEGATIVE Final    Comment: (NOTE) SARS-CoV-2 target nucleic acids are NOT DETECTED.  The SARS-CoV-2 RNA is generally detectable in upper respiratory specimens during the acute phase of infection. The lowest concentration of SARS-CoV-2 viral copies this assay can detect is 138 copies/mL. A negative result does not preclude SARS-Cov-2 infection and should not be used as the sole basis for treatment or other patient management decisions. A negative result may occur with  improper specimen collection/handling, submission of specimen other than nasopharyngeal swab, presence of viral mutation(s) within the areas targeted by this assay, and inadequate number of viral copies(<138 copies/mL). A negative result must be combined with clinical observations, patient history, and epidemiological information. The expected result is Negative.  Fact Sheet for Patients:  EntrepreneurPulse.com.au  Fact Sheet  for Healthcare Providers:  IncredibleEmployment.be  This test is no t yet approved or cleared by the [place] FDA and  has been authorized for detection and/or diagnosis of SARS-CoV-2 by FDA under an Emergency Use Authorization (EUA). This EUA will remain  in effect (meaning this test can be used) for the duration of the COVID-19 declaration under Section 564(b)(1) of the Act, 21 U.S.C.section 360bbb-3(b)(1), unless the authorization is terminated  or revoked sooner.       Influenza A by PCR NEGATIVE NEGATIVE Final   Influenza B by PCR NEGATIVE NEGATIVE Final    Comment: (NOTE) The Xpert Xpress SARS-CoV-2/FLU/RSV plus assay is intended as an aid in the diagnosis of influenza from Nasopharyngeal swab specimens and should not be used as a sole basis for treatment. Nasal washings and aspirates are unacceptable for Xpert Xpress SARS-CoV-2/FLU/RSV testing.  Fact Sheet for Patients: EntrepreneurPulse.com.au  Fact Sheet for Healthcare Providers: IncredibleEmployment.be  This test is not yet approved or cleared by the [place] FDA and has been authorized for detection and/or diagnosis of SARS-CoV-2 by FDA under an Emergency Use Authorization (EUA). This EUA will remain in effect (  meaning this test can be used) for the duration of the COVID-19 declaration under Section 564(b)(1) of the Act, 21 U.S.C. section 360bbb-3(b)(1), unless the authorization is terminated or revoked.  Performed at [facility], [address], [place], [place] [address]          Radiology Studies: CT ABDOMEN PELVIS WO CONTRAST  Result Date: 09/25/2020 CLINICAL DATA:  Abdominal pain, acute nonlocalized abdominal pain associated with muscle spasms. EXAM: CT ABDOMEN AND PELVIS WITHOUT CONTRAST TECHNIQUE: Multidetector CT imaging of the abdomen and pelvis was performed following the standard protocol without IV contrast. COMPARISON:   February 24, 2016 FINDINGS: Lower chest: Basilar atelectasis/early consolidation on the RIGHT. No effusion. Hepatobiliary: Diffuse central low attenuation in the liver spanning medial an lateral segment of LEFT hepatic lobe along the fissure for false form ligament measuring up to 9.3 x 4.3 cm in total. This appears either anterior 2 or also involving intrahepatic inferior vena cava tracking towards the confluence of the hepatic veins. No pericholecystic stranding. No additional lesions visualized. Pancreas: Not well visualized. Grossly unremarkable and without signs of inflammation. Spleen: Spleen normal size and contour though with serosal disease suspected along the anterior margin. See below. Adrenals/Urinary Tract: Adrenal glands are normal. Kidneys with smooth contours. No hydronephrosis. Urinary bladder is collapsed. Stomach/Bowel: Gastric thickening is suggested in the area of the gastric antrum with some perigastric stranding. Mild distension of small bowel loops in the abdomen without signs of gross obstruction. Colonic loops decompressed distal to the transverse colon. Suggestion of low-attenuation focus along the margin of the sigmoid (image 67/3) this is suspicious for serosal implant with an adjacent soft tissue nodule in the sigmoid mesentery (image 66/6) 2.3 x 1.5 cm. Mid sigmoid abnormality as discussed below. Visualized portions of the appendix are normal. Vascular/Lymphatic: Calcified atheromatous plaque, scattered throughout the normal caliber abdominal aorta. Smooth contour of the IVC. Scattered small lymph nodes in the retroperitoneum. None with pathologic enlargement. No gross adenopathy in the abdomen though with ill-defined soft tissue in the porta hepatis that may represent conglomerate adenopathy the largest potentially 13 mm short axis (image 28/3) Scattered lymph nodes adjacent to rectosigmoid colon best seen on coronal image 76, adjacent to either soft tissue implant or large lymph  node. Adjacent to the dominant soft tissue nodule adjacent to the colon there is question of colonic thickening from which this area arises. This is at the level of the mid sigmoid colon. Ovaries along the bilateral pelvic sidewalls. Reproductive: Ovaries along the bilateral pelvic sidewalls. No adnexal mass. Uterus unremarkable. Other: While there is no ascites. There is fascial thickening with nodularity, for example on image 28 of series 3 6 mm thickening of the abdominal fascia adjacent to the stomach. Nodularity tracking along the anterior and lateral transversalis and in the lateral conal fascia in the LEFT hemiabdomen. Indistinct appearance of the omental fat raising the question of omental disease. Musculoskeletal: No acute musculoskeletal findings. Signs of sacroiliitis with asymmetric involvement of the RIGHT sacral iliac joint. No destructive bone finding or acute bone process. IMPRESSION: 1. 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 Hepatic MRI or CT with contrast may be helpful as the patient is able for further evaluation. 2. Question of gastric antral thickening.  Areas not well assessed. 3. Signs of sacroiliitis with asymmetric involvement of the RIGHT sacral iliac joint. Correlate with any clinical or laboratory evidence of seronegative spondyloarthropathy. 4. Juxta diaphragmatic airspace  disease in the RIGHT chest favored to represent juxta diaphragmatic atelectasis. 5. Aortic atherosclerosis. Electronically Signed   By: Zetta Bills M.D.   On: 09/25/2020 19:04        Scheduled Meds:  mesalamine  1.2 g Oral BID   Continuous Infusions:  lactated ringers 125 mL/hr at 09/26/20 0004          Aline August, MD [facility] 09/26/2020, 9:56 AM

## 2020-09-26 NOTE — Progress Notes (Signed)
HOSPITAL MEDICINE OVERNIGHT EVENT NOTE    Received a phone call from Dr. Polly Cobia with radiology earlier this evening, informing me about identification of a 3.0 x 2.1 x 2.5 cm hyperintense mass in the posterior left pelvis adherent to the serosa of the sigmoid colon in addition to a separate 2.2 x 1.7 x 1.3 cm hyperintense mass of the sigmoid mesentery and another separate more anteriorly located thick-walled 4.3 x 2.9 x 2.8 collection.  Patient additionally identified to have liver masses measuring 4.6 x 4.7 cm in the caudate lobe and 5.3 x 4.0 cm in the posterior left liver.  Patient's clinical course and presentation discussed with Dr. Polly Cobia.  Considering patient's symptoms and time course of presentation he feels that these findings could very well be attributable to an infectious process that must be considered in addition to malignancy.  He recommended discussing possibly aspirating a liver lesion for confirmation on infection/malignancy with interventional radiology.  I therefore discussed the case with Dr. Vernard Gambles with interventional radiology who feels that aspiration of one of the liver lesions could be very doable and recommends doing it tomorrow morning.  He recommends n.p.o. after midnight and continuing to hold any anticoagulation in preparation.  In the meantime, considering ongoing SIRS criteria and concern for possible infectious process initiating intravenous Zosyn and obtaining blood cultures, CRP, procalcitonin and lactic acid.  Will continue to monitor patient closely.   Vernelle Emerald  MD [facility]

## 2020-09-26 NOTE — Consult Note (Addendum)
[facility] Consult: 8:23 AM 09/26/2020  LOS: 0 days   Referring Provider: Dr Starla Link  Primary Care Physician:  Kerin Perna, NP Primary Gastroenterologist:  Dr. Tarri Glenn.       Reason for Consultation: Mass in sigmoid.   HPI: Nicole Browning is a 50 y.o. female.  PMH anemia (hgb 6.8 and transfused PRBC in 2017).  GERD.  Vitamin D deficiency.   Vulvar and buttock abscesses, s/p multiple I&Ds  06/22/2020 colonoscopy.  Initial screening study.  History of minor rectal bleeding.  Perianal abscess.  Ascending colon polyp resected.  Mild, pancolitis and ileitis biopsied.  Colon pathology with chronic, mild to moderately active colitis.  Polyp was inflammatory.  Small bowel biopsies benign without inflammation.  Started on Lialda 2.4 g/day. 06/26/2020 drainage of 5 x 5 cm left buttock/perianal abscess.  1 week post drainage doxycycline. 07/10/2020 I&D L buttock/perianal abscess.  Augmentin prescribed after. 07/11/2020 labs:CRP WNL.  Stool cultures negative.  Fecal calprotectin ordered but test canceled. Hgb 10.2, MCV 82.  Iron low at 16.  Ferritin 10, borderline low. 07/24/2020 MR pelvis: No perianal or perirectal fistula or abscess.  Tiny SQ collections associated with fat stranding in the left gluteal fold, potential small abscesses but likely too small to drain.  No fistulous communication between these collections to the anus or rectum.  Small free fluid in pelvic cul-de-sac.  Left ovarian hemorrhagic cyst.  Superficial, SQ, 1.4 cm left labial cyst, favoring sebaceous cyst.  Started on iron sulfate 325 mg twice daily and stool softener as of oncology office visit w Dr Chryl Heck on 07/24/20.  Unable to tolerate this due to constipation so stopped after 2 weeks.  Pt reports some GI upset after she takes her Lialda but she has  been taking this reliably.  5 days abdominal pain, nausea, vomiting.  Pain primarily periumbilical intermittent but sharp/throbbing.  Initially formed, brown stools but then developed nonbloody loose, brown stools.  Vomiting is nonbloody.  Not tolerating p.o.  Denies fever, chills.  Used some ibuprofen for management but it was not helpful.  Up until this weekend her weight had been stable but she thinks she may have dropped a few pounds over the last few days. At urgent care she was tachycardic, hypotensive and sent to the ED. 09/25/20 CTAP w/O contrast: Low-attenuation lesion at margin of sigmoid suspicious for serosal implant with adjacent 2.3  x 0.5 cm soft tissue sigmoid mesenteric nodule.  Scattered rectosigmoid adenopathy most prominent is adjacent to the soft tissue nodule.  Suggestion of gastric wall thickening.  Distended small bowel loops without obstruction.  Nodularity and fascial thickening of abdominal fascia adjacent to stomach which tracks anteriorly to the lateral transversalis, lateral colon.  Normal fascia and left hemiabdomen.  Indistinct omental fat concerning for omental disease.  Area of low attenuation in the left hepatic lobe measuring up to 9.3 x 4.3 cm tracks towards confluence of hepatic veins.  No GB thickening.  Right sacroileitis.  Ovaries and uterus unremarkable.  Right chest juxta diaphragmatic airspace disease, likely atelectasis.  Aortic atherosclerosis.  Hgb 9.7.  WBCs 15.  Na 31.  BUN/creatinine 43/2.5 but improved this morning (normal in May) T bili 0.8.  Alk phos 79.  AST/ALT 133/231.  Received single dose of Rocephin last night for possible UTI Heart rates as high as 120s.  SBP's mid 90s to low 100s.  SBP's 60s to 19s.  Room air sats near 100%.  Patient smokes 10 cigarettes daily.  Drinks beer on weekends she will consume a sixpack.  Works as a Secretary/administrator.  Single mother to 17 year old daughter. No family history of IBD, cancers that she knows of.     Past  Medical History:  Diagnosis Date   Anemia    GERD (gastroesophageal reflux disease)    IBS (irritable bowel syndrome)    Perirectal abscess    Protein-calorie malnutrition ([place]) 02/29/2016   Tobacco use    UC (ulcerative colitis) ([place])     Past Surgical History:  Procedure Laterality Date   IRRIGATION AND DEBRIDEMENT ABSCESS N/A 02/24/2016   Procedure: IRRIGATION AND DEBRIDEMENT ABSCESS;  Surgeon: Stark Klein, MD;  Location: [place];  Service: General;  Laterality: N/A;    Prior to Admission medications   Medication Sig Start Date End Date Taking? Authorizing Provider  aspirin EC 325 MG tablet Take 325 mg by mouth every 6 (six) hours as needed for moderate pain.   Yes [provider]  bismuth subsalicylate (PEPTO BISMOL) 262 MG/15ML suspension Take 30 mLs by mouth every 6 (six) hours as needed for indigestion.   Yes [provider]  ibuprofen (ADVIL) 200 MG tablet Take 200 mg by mouth every 6 (six) hours as needed for moderate pain.   Yes [provider]  mesalamine (LIALDA) 1.2 g EC tablet Take 1 tablet (1.2 g total) by mouth in the morning and at bedtime. 07/11/20  Yes Noralyn Pick, NP  Multiple Vitamins-Minerals (WOMENS MULTIVITAMIN PO) Take 1 tablet by mouth daily.   Yes [provider]  naproxen sodium (ALEVE) 220 MG tablet Take 220 mg by mouth daily as needed (pain).   Yes [provider]  POTASSIUM PO Take 1 tablet by mouth daily.   Yes [provider]  Vitamin D, Ergocalciferol, (DRISDOL) 1.25 MG (50000 UNIT) CAPS capsule Take 1 capsule (50,000 Units total) by mouth every 7 (seven) days. TAKE ONE CAPSULE BY MOUTH ONCE WEEKLY 07/22/20  Yes Noralyn Pick, NP  VITAMIN E PO Take 1 tablet by mouth daily.   Yes [provider]  doxycycline (VIBRAMYCIN) 100 MG capsule Take 1 capsule (100 mg total) by mouth 2 (two) times daily. Patient not taking: Reported on 09/25/2020 07/09/20   Scot Jun, FNP     Scheduled Meds:  mesalamine  1.2 g Oral BID   Infusions:  lactated ringers 125 mL/hr at 09/26/20 0004   PRN Meds: morphine injection, ondansetron **OR** ondansetron (ZOFRAN) IV   Allergies as of 09/25/2020 - Review Complete 09/25/2020  Allergen Reaction Noted   Bactrim [sulfamethoxazole-trimethoprim] Hives 01/12/2018   Percocet [oxycodone-acetaminophen] Nausea Only 12/20/2014   Valium [diazepam] Nausea Only 12/20/2014    Family History  Problem Relation Age of Onset   Diabetes Mother    COPD Father    Leukemia Brother    Colon cancer Neg Hx    Colon polyps Neg Hx    Esophageal cancer Neg Hx    Rectal cancer Neg Hx    Stomach cancer Neg Hx     Social History   Socioeconomic History   Marital status: Single  Spouse name: Not on file   Number of children: Not on file   Years of education: Not on file   Highest education level: Not on file  Occupational History   Not on file  Tobacco Use   Smoking status: Every Day    Packs/day: 0.33    Years: 28.00    Pack years: 9.24    Types: Cigarettes   Smokeless tobacco: Never  Vaping Use   Vaping Use: Never used  Substance and Sexual Activity   Alcohol use: Yes    Alcohol/week: 2.0 standard drinks    Types: 2 Glasses of wine per week   Drug use: No   Sexual activity: Not Currently    Birth control/protection: Injection  Other Topics Concern   Not on file  Social History Narrative   Not on file   Social Determinants of Health   Financial Resource Strain: Not on file  Food Insecurity: Not on file  Transportation Needs: Not on file  Physical Activity: Not on file  Stress: Not on file  Social Connections: Not on file  Intimate Partner Violence: Not on file    REVIEW OF SYSTEMS: Constitutional: Weakness, fatigue since onset of symptoms over the weekend. ENT:  No nose bleeds Pulm: No shortness of breath.  No cough.  When she takes a deep breath it triggers worsening of abdominal pain. CV:  No  palpitations, no LE edema.  No angina GU:  No hematuria, no frequency GI: See HPI. Heme: Denies unusual or excessive bleeding or bruising. GYN: No menstrual bleeding as she is on Depo-Provera. Transfusions: PRBC in 2017. Neuro:  No headaches, no peripheral tingling or numbness.  No dizziness, no syncope. Derm:  No itching, no rash or sores.  Endocrine:  No sweats or chills.  No polyuria or dysuria Immunization: Reviewed.  Received Moderna vaccination for COVID-19 in August 2021   PHYSICAL EXAM: Vital signs in last 24 hours: Vitals:   09/26/20 0300 09/26/20 0657  BP: 100/73 98/67  Pulse: (!) 101 (!) 112  Resp: 16 17  Temp:    SpO2: 98% 97%   Wt Readings from Last 3 Encounters:  07/24/20 59 kg  07/11/20 59.1 kg  06/22/20 59.9 kg    General: Pleasant, comfortable, moderately unwell appearing. Head: Facial asymmetry or swelling.  No signs of head trauma. Eyes: No scleral icterus.  No conjunctival pallor.  EOMI Ears: Not hard of hearing Nose: No congestion or discharge Mouth: Dentures in place, not removed for exam.  Moist, pink, clear mucosa.  Tongue midline Neck: No JVD, no masses, no thyromegaly Lungs: Clear bilaterally without labored breathing.  Deep breathing does trigger worsening of belly pain. Heart: RRR.  No MRG.  S1, S2 present Abdomen: Diffusely tender to moderate pressure.  Do not appreciate masses, organomegaly, bruits, hernias. Rectal: Deferred Musc/Skeltl: No joint redness, swelling or gross deformities.  No contractures Extremities: No CCE. Neurologic: Alert.  Oriented x3.  Good historian.  Moves all 4 limbs with full strength.  No tremors. Skin: No rash, no sores, no suspicious lesions Nodes: No cervical adenopathy Psych: Calm, pleasant, cooperative.  Intake/Output from previous day: No intake/output data recorded. Intake/Output this shift: No intake/output data recorded.  LAB RESULTS: Recent Labs    09/25/20 1641 09/26/20 0350  WBC 15.0* 12.7*   HGB 11.7* 9.7*  HCT 36.5 29.4*  PLT 319 240   BMET Lab Results  Component Value Date   NA 131 (L) 09/26/2020   NA 132 (L) 09/25/2020  NA 140 07/11/2020   K 3.9 09/26/2020   K 3.9 09/25/2020   K 4.0 07/11/2020   CL 100 09/26/2020   CL 94 (L) 09/25/2020   CL 105 07/11/2020   CO2 20 (L) 09/26/2020   CO2 24 09/25/2020   CO2 30 07/11/2020   GLUCOSE 108 (H) 09/26/2020   GLUCOSE 116 (H) 09/25/2020   GLUCOSE 82 07/11/2020   BUN 44 (H) 09/26/2020   BUN 43 (H) 09/25/2020   BUN 10 07/11/2020   CREATININE 1.52 (H) 09/26/2020   CREATININE 2.54 (H) 09/25/2020   CREATININE 0.79 07/11/2020   CALCIUM 8.1 (L) 09/26/2020   CALCIUM 8.8 (L) 09/25/2020   CALCIUM 9.3 07/11/2020   LFT Recent Labs    09/25/20 1641 09/26/20 0350  PROT 8.1 6.2*  ALBUMIN 2.6* 2.0*  AST 133* 116*  ALT 231* 186*  ALKPHOS 79 62  BILITOT 0.8 0.8  BILIDIR  --  0.3*  IBILI  --  0.5   PT/INR Lab Results  Component Value Date   INR 1.19 02/24/2016   INR 1.09 12/20/2014   Hepatitis Panel No results for input(s): HEPBSAG, HCVAB, HEPAIGM, HEPBIGM in the last 72 hours. C-Diff No components found for: CDIFF Lipase     Component Value Date/Time   LIPASE 22 09/25/2020 1641    Drugs of Abuse  No results found for: LABOPIA, COCAINSCRNUR, LABBENZ, AMPHETMU, THCU, LABBARB   RADIOLOGY STUDIES: CT ABDOMEN PELVIS WO CONTRAST  Result Date: 09/25/2020 CLINICAL DATA:  Abdominal pain, acute nonlocalized abdominal pain associated with muscle spasms. EXAM: CT ABDOMEN AND PELVIS WITHOUT CONTRAST TECHNIQUE: Multidetector CT imaging of the abdomen and pelvis was performed following the standard protocol without IV contrast. COMPARISON:  February 24, 2016 FINDINGS: Lower chest: Basilar atelectasis/early consolidation on the RIGHT. No effusion. Hepatobiliary: Diffuse central low attenuation in the liver spanning medial an lateral segment of LEFT hepatic lobe along the fissure for false form ligament measuring up to 9.3 x  4.3 cm in total. This appears either anterior 2 or also involving intrahepatic inferior vena cava tracking towards the confluence of the hepatic veins. No pericholecystic stranding. No additional lesions visualized. Pancreas: Not well visualized. Grossly unremarkable and without signs of inflammation. Spleen: Spleen normal size and contour though with serosal disease suspected along the anterior margin. See below. Adrenals/Urinary Tract: Adrenal glands are normal. Kidneys with smooth contours. No hydronephrosis. Urinary bladder is collapsed. Stomach/Bowel: Gastric thickening is suggested in the area of the gastric antrum with some perigastric stranding. Mild distension of small bowel loops in the abdomen without signs of gross obstruction. Colonic loops decompressed distal to the transverse colon. Suggestion of low-attenuation focus along the margin of the sigmoid (image 67/3) this is suspicious for serosal implant with an adjacent soft tissue nodule in the sigmoid mesentery (image 66/6) 2.3 x 1.5 cm. Mid sigmoid abnormality as discussed below. Visualized portions of the appendix are normal. Vascular/Lymphatic: Calcified atheromatous plaque, scattered throughout the normal caliber abdominal aorta. Smooth contour of the IVC. Scattered small lymph nodes in the retroperitoneum. None with pathologic enlargement. No gross adenopathy in the abdomen though with ill-defined soft tissue in the porta hepatis that may represent conglomerate adenopathy the largest potentially 13 mm short axis (image 28/3) Scattered lymph nodes adjacent to rectosigmoid colon best seen on coronal image 76, adjacent to either soft tissue implant or large lymph node. Adjacent to the dominant soft tissue nodule adjacent to the colon there is question of colonic thickening from which this area arises. This is at  the level of the mid sigmoid colon. Ovaries along the bilateral pelvic sidewalls. Reproductive: Ovaries along the bilateral pelvic  sidewalls. No adnexal mass. Uterus unremarkable. Other: While there is no ascites. There is fascial thickening with nodularity, for example on image 28 of series 3 6 mm thickening of the abdominal fascia adjacent to the stomach. Nodularity tracking along the anterior and lateral transversalis and in the lateral conal fascia in the LEFT hemiabdomen. Indistinct appearance of the omental fat raising the question of omental disease. Musculoskeletal: No acute musculoskeletal findings. Signs of sacroiliitis with asymmetric involvement of the RIGHT sacral iliac joint. No destructive bone finding or acute bone process. IMPRESSION: 1. 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 Hepatic MRI or CT with contrast may be helpful as the patient is able for further evaluation. 2. Question of gastric antral thickening.  Areas not well assessed. 3. Signs of sacroiliitis with asymmetric involvement of the RIGHT sacral iliac joint. Correlate with any clinical or laboratory evidence of seronegative spondyloarthropathy. 4. Juxta diaphragmatic airspace disease in the RIGHT chest favored to represent juxta diaphragmatic atelectasis. 5. Aortic atherosclerosis. Electronically Signed   By: Zetta Bills M.D.   On: 09/25/2020 19:04    IMPRESSION:   Multiple CT scan abnormalities including sigmoid and sigmoid mesenteric lesions, rectosigmoid adenopathy, gastric thickening/nodularity.  Concern for omental disease, left hepatic lobe lesion.  Very recent colonoscopy 3 months ago showing mild, pancolitis/ileitis, biopsies consistent with colonic IBD/Crohn's, inflammatory polyp.  Hx recurrent vulvar, perianal abscesses w multiple drainage procedures.  Intermittent bloody stools have ceased since initiating Lialda in May.     Anemia.   Longstanding with iron deficiency.  Current Hgb this AM not much improved from 2 months ago.   Prescribed twice daily  oral iron by oncologist but unable to tolerate and discontinued after 2 weeks.     Hyponatremia.  Elevated transaminases.  ALT > AST.  Left hepatic lesion on CT scan.     Sacroiliitis.   PLAN:        Discussed pt with Dr. Lyndel Safe.  Plan for MRI abdomen/pelvis for further definition of multiple abnormalities visualized on CT.   Nicole Browning  09/26/2020, 8:23 AM Phone [phone]   Attending physician's note   I have reviewed the chart and did not take history or examine the patient since she had gone to MRI. I agree with the Advanced Practitioner's note, impression and recommendations.   Likely Crohn's disease with recurrent abscesses Abnormal CT showing ?sig mass with possible liver lesions s/o hepatic mets.  However, recent colon was neg for masses. ?Could also represent early abscess Longstanding IDA Elevated LFTs s/o hepatocellular pattern. H/O ETOH  Sacroiliitis UTI on Rocephin  Plan: -MRI Abdo/pelvis. -Further WU thereafter.  May require liver Bx. -May require EGD (gastric thickening noted on CT as well) and repeat colon.   Carmell Austria, MD [facility] [phone]

## 2020-09-27 ENCOUNTER — Inpatient Hospital Stay (HOSPITAL_COMMUNITY): Payer: Medicaid Other

## 2020-09-27 ENCOUNTER — Encounter (HOSPITAL_COMMUNITY): Payer: Self-pay | Admitting: Internal Medicine

## 2020-09-27 DIAGNOSIS — K529 Noninfective gastroenteritis and colitis, unspecified: Secondary | ICD-10-CM

## 2020-09-27 DIAGNOSIS — R160 Hepatomegaly, not elsewhere classified: Secondary | ICD-10-CM

## 2020-09-27 DIAGNOSIS — R109 Unspecified abdominal pain: Secondary | ICD-10-CM

## 2020-09-27 DIAGNOSIS — K6389 Other specified diseases of intestine: Secondary | ICD-10-CM | POA: Diagnosis not present

## 2020-09-27 DIAGNOSIS — R1010 Upper abdominal pain, unspecified: Secondary | ICD-10-CM

## 2020-09-27 DIAGNOSIS — N179 Acute kidney failure, unspecified: Secondary | ICD-10-CM | POA: Diagnosis not present

## 2020-09-27 LAB — CBC WITH DIFFERENTIAL/PLATELET
Abs Immature Granulocytes: 0.27 10*3/uL — ABNORMAL HIGH (ref 0.00–0.07)
Basophils Absolute: 0 10*3/uL (ref 0.0–0.1)
Basophils Relative: 0 %
Eosinophils Absolute: 0 10*3/uL (ref 0.0–0.5)
Eosinophils Relative: 0 %
HCT: 25.4 % — ABNORMAL LOW (ref 36.0–46.0)
Hemoglobin: 8.5 g/dL — ABNORMAL LOW (ref 12.0–15.0)
Immature Granulocytes: 3 %
Lymphocytes Relative: 12 %
Lymphs Abs: 1.3 10*3/uL (ref 0.7–4.0)
MCH: 26.8 pg (ref 26.0–34.0)
MCHC: 33.5 g/dL (ref 30.0–36.0)
MCV: 80.1 fL (ref 80.0–100.0)
Monocytes Absolute: 1 10*3/uL (ref 0.1–1.0)
Monocytes Relative: 9 %
Neutro Abs: 8.3 10*3/uL — ABNORMAL HIGH (ref 1.7–7.7)
Neutrophils Relative %: 76 %
Platelets: 283 10*3/uL (ref 150–400)
RBC: 3.17 MIL/uL — ABNORMAL LOW (ref 3.87–5.11)
RDW: 15.3 % (ref 11.5–15.5)
WBC: 10.9 10*3/uL — ABNORMAL HIGH (ref 4.0–10.5)
nRBC: 0 % (ref 0.0–0.2)

## 2020-09-27 LAB — COMPREHENSIVE METABOLIC PANEL
ALT: 131 U/L — ABNORMAL HIGH (ref 0–44)
AST: 55 U/L — ABNORMAL HIGH (ref 15–41)
Albumin: 1.8 g/dL — ABNORMAL LOW (ref 3.5–5.0)
Alkaline Phosphatase: 60 U/L (ref 38–126)
Anion gap: 8 (ref 5–15)
BUN: 10 mg/dL (ref 6–20)
CO2: 25 mmol/L (ref 22–32)
Calcium: 8.3 mg/dL — ABNORMAL LOW (ref 8.9–10.3)
Chloride: 98 mmol/L (ref 98–111)
Creatinine, Ser: 0.78 mg/dL (ref 0.44–1.00)
GFR, Estimated: >60 mL/min (ref >60)
Glucose, Bld: 119 mg/dL — ABNORMAL HIGH (ref 70–99)
Potassium: 3.3 mmol/L — ABNORMAL LOW (ref 3.5–5.1)
Sodium: 131 mmol/L — ABNORMAL LOW (ref 135–145)
Total Bilirubin: 0.7 mg/dL (ref 0.3–1.2)
Total Protein: 5.8 g/dL — ABNORMAL LOW (ref 6.5–8.1)

## 2020-09-27 LAB — PROTIME-INR
INR: 1.2 (ref 0.8–1.2)
Prothrombin Time: 14.7 seconds (ref 11.4–15.2)

## 2020-09-27 LAB — URINE CULTURE

## 2020-09-27 LAB — MAGNESIUM: Magnesium: 2.2 mg/dL (ref 1.7–2.4)

## 2020-09-27 IMAGING — US US PARACENTESIS
1 series · 13 of 17 positions shown · non-contrast
Comparison: None.

INDICATION: 50-year-old with indeterminate liver lesions and scheduled for
ultrasound-guided liver aspiration or biopsy.

EXAM:
ULTRASOUND-GUIDED PARACENTESIS
TECHNIQUE: The procedure was explained to the patient. The risks and benefits
of the procedure were discussed and the patient's questions were
addressed. Informed consent was obtained from the patient. The
abdomen was evaluated with ultrasound and perihepatic ascites was
identified. Consent was also obtained for a paracentesis. Decision
was made to perform paracentesis prior to biopsy. The anterior
abdomen was prepped with chlorhexidine and sterile field was
created. Skin was anesthetized using 1% lidocaine. A 19 gauge Yueh
catheter was directed into the left upper abdominal fluid collection
using ultrasound guidance. Opaque brown fluid was aspirated.
Approximately 100 mL of fluid was removed. Based on the appearance
of the fluid, liver biopsy was not performed at this time. Fluid was
sent for culture and cytology. Bandage placed over the puncture
site.

[Series 1: us biopsy (liver) · 13 of 17 slices shown]
[im 1/17]
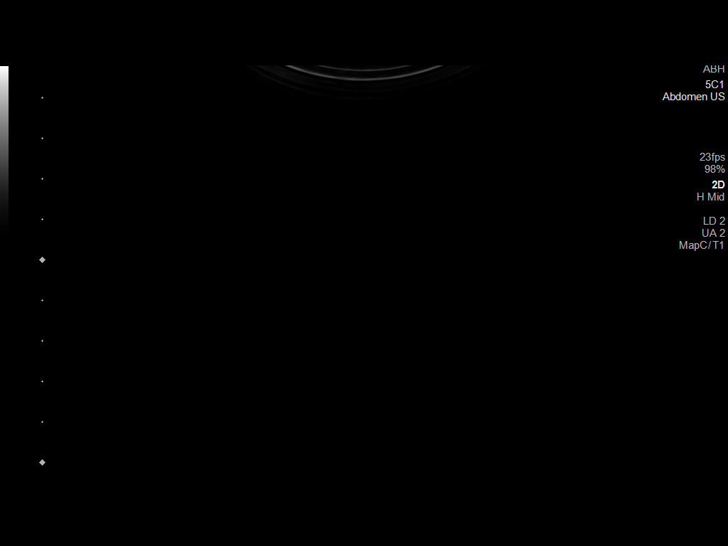
[im 2/17]
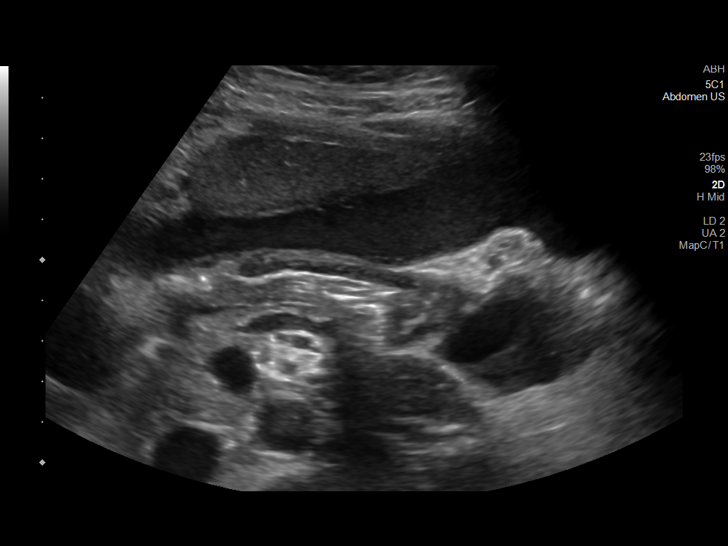
[im 4/17]
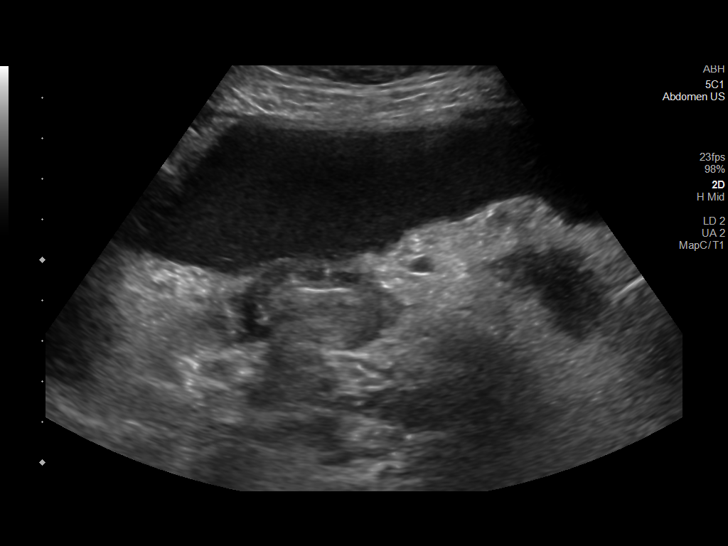
[im 5/17]
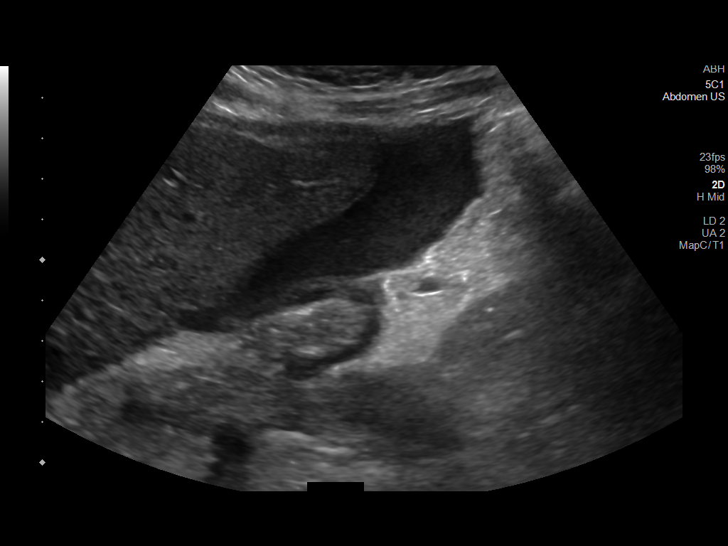
[im 6/17]
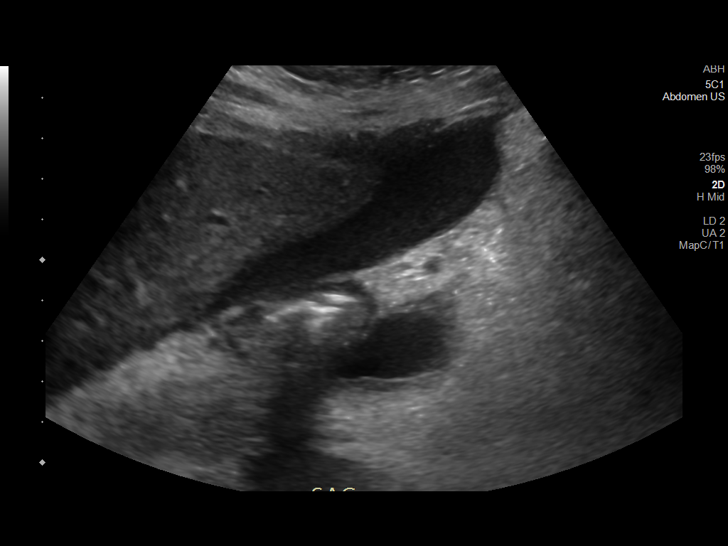
[im 8/17]
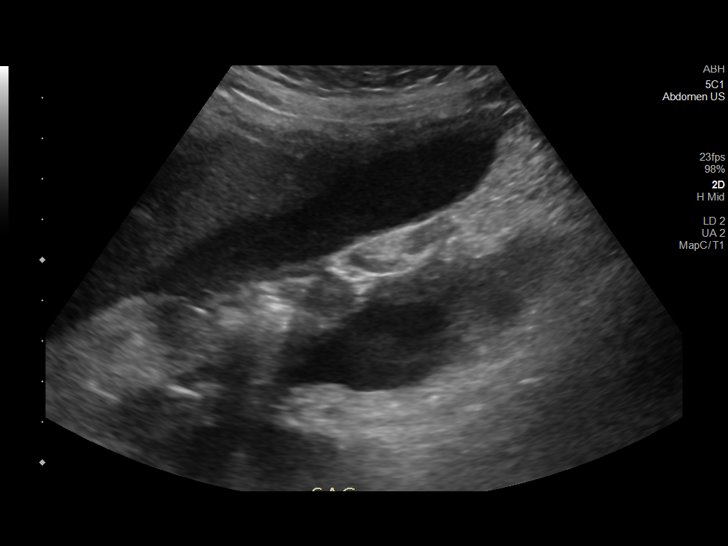
[im 9/17]
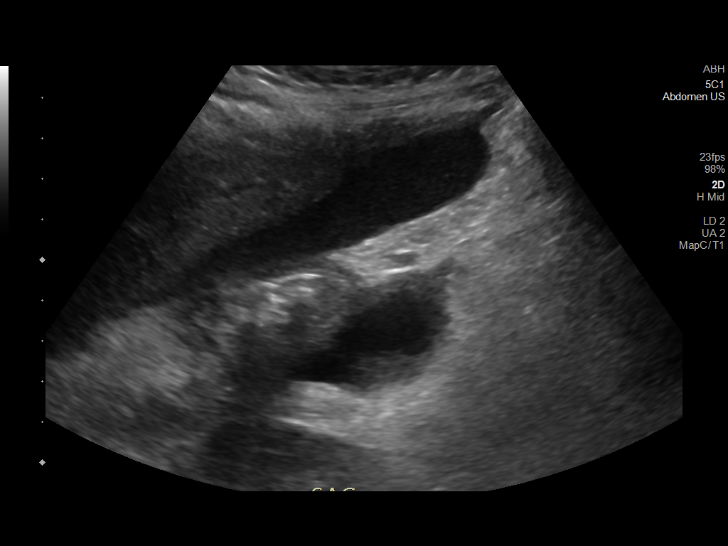
[im 10/17]
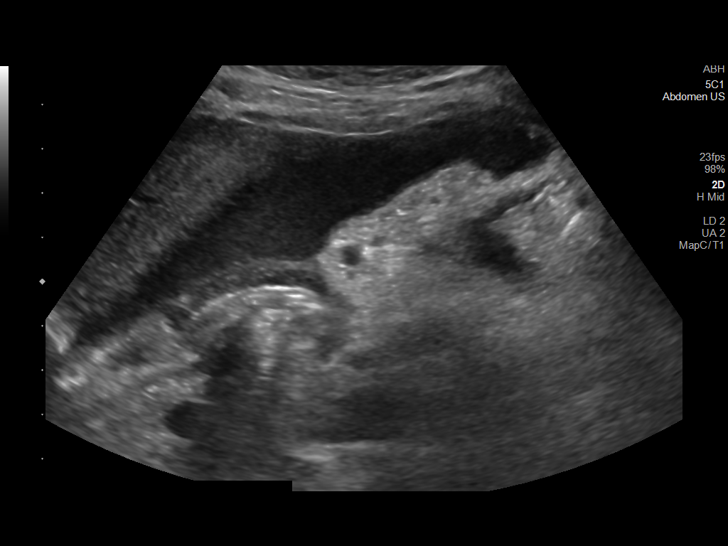
[im 12/17]
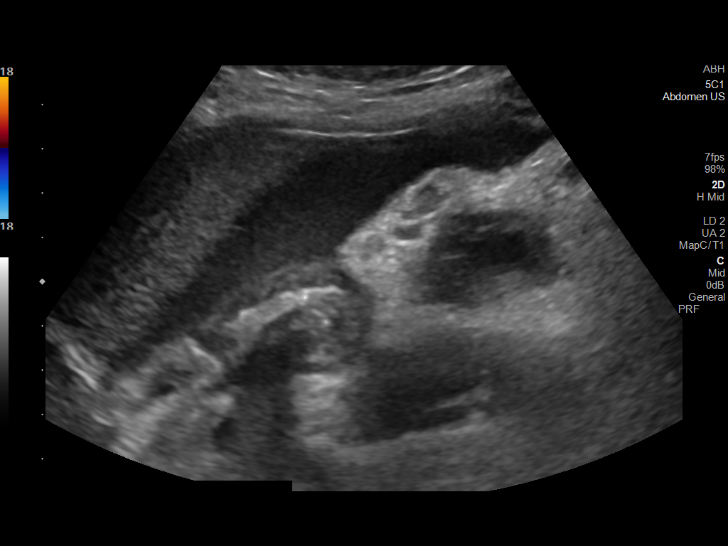
[im 13/17]
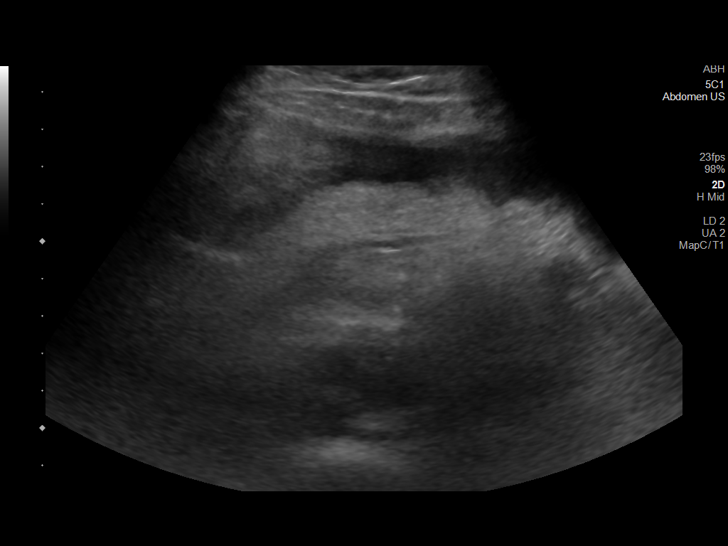
[im 14/17]
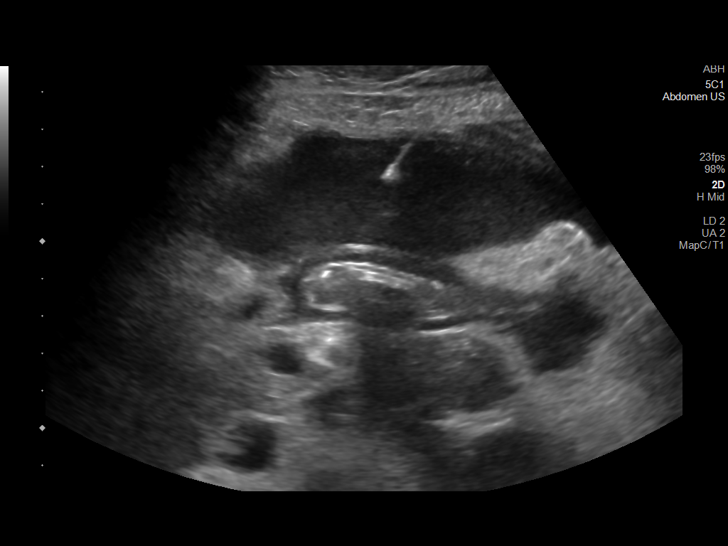
[im 16/17]
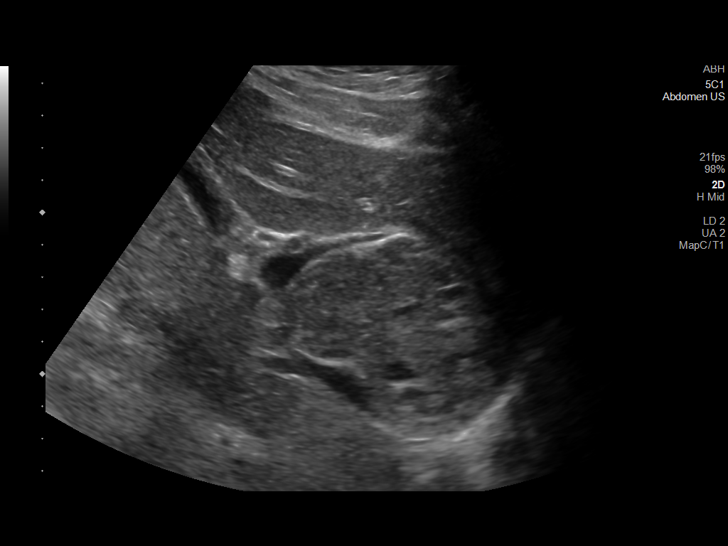
[im 17/17]
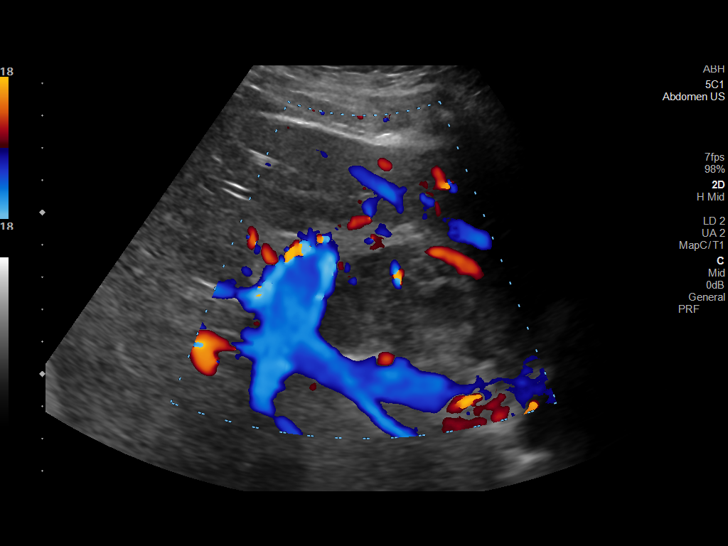

[13 of 17 positions shown; findings below may reference images not displayed]

MEDICATIONS:
Versed 1.5 mg, fentanyl 50 mcg. Patient was monitored by radiology
nurse throughout the procedure.

Sedation time: 19 minutes

COMPLICATIONS:
None immediate.
FINDINGS: Small amount of perihepatic ascites along the left side of the
abdomen. 100 mL of opaque brown fluid was removed.

The lesions in the caudate and posterior left hepatic lobe were
identified. The caudate lesion is more conspicuous. The posterior
left hepatic lobe lesion is a subtle heterogeneous lesion.
IMPRESSION: Successful ultrasound-guided paracentesis yielding 100 mL brown
fluid from the left upper abdomen.

Ultrasound-guided liver biopsy was not performed at this time.
Biopsy to the posterior left hepatic lobe appears to be feasible if
needed in the future.

## 2020-09-27 IMAGING — CT CT CHEST W/ CM
2 of 3 series · 15 of 36 positions shown, 18 images · IV contrast (omnipaque)
Comparison: [DATE] [DATE], [DATE].  [DATE] [DATE], [DATE].

CLINICAL DATA: Cancer of unknown origin.

EXAM:
CT CHEST WITH CONTRAST
TECHNIQUE: Multidetector CT imaging of the chest was performed during
intravenous contrast administration.
CONTRAST:  75mL OMNIPAQUE IOHEXOL 300 MG/ML  SOLN

[Series 3: chest with 2mm st · axial · 0.74mm/px · z∈[+1272,+1528]mm · 12 of 152 slices shown, 15 images]
[im 12/152  mediastinal]
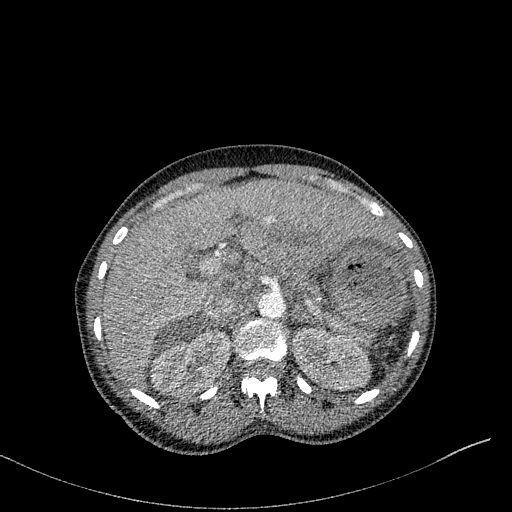
[im 12/152  lung]
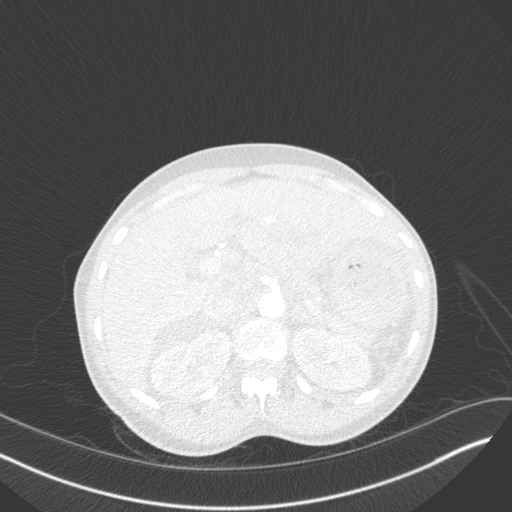
[im 23/152  lung]
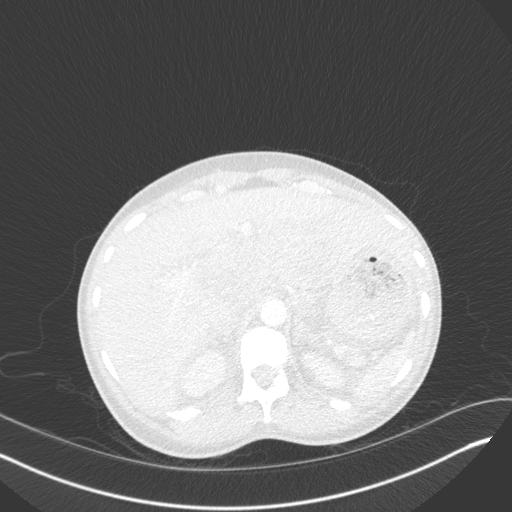
[im 34/152  lung]
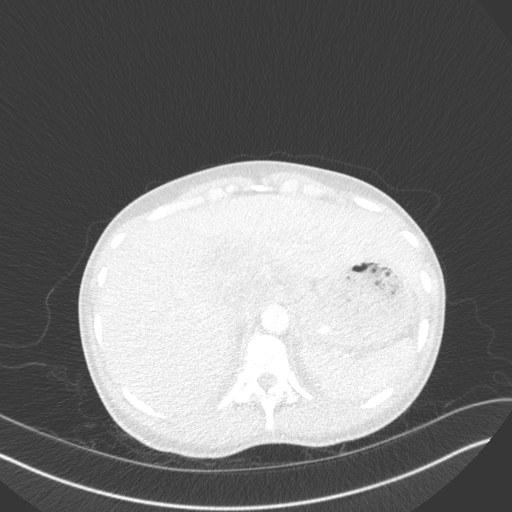
[im 45/152  lung]
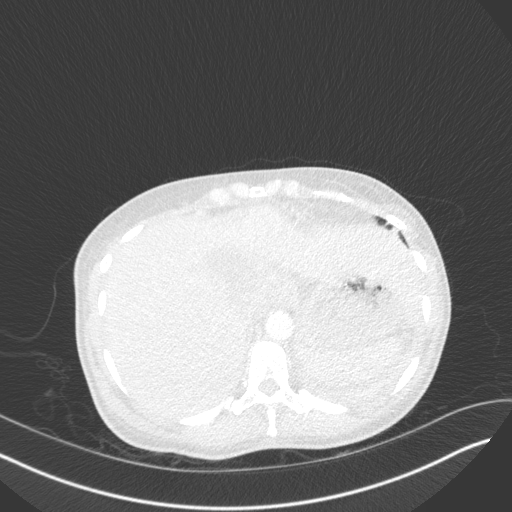
[im 56/152  mediastinal]
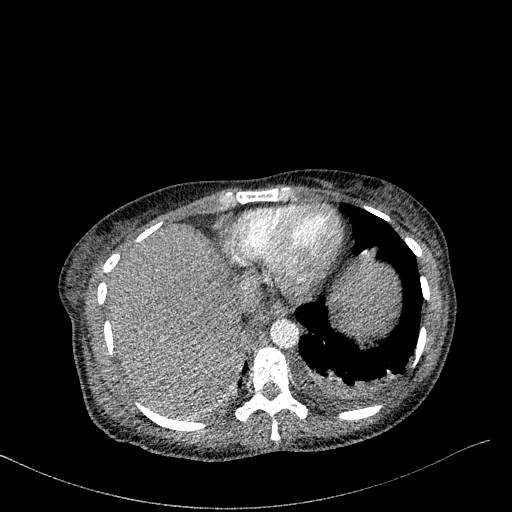
[im 56/152  lung]
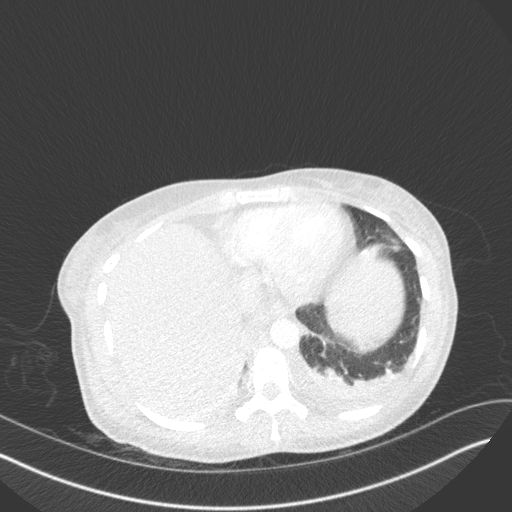
[im 68/152  lung]
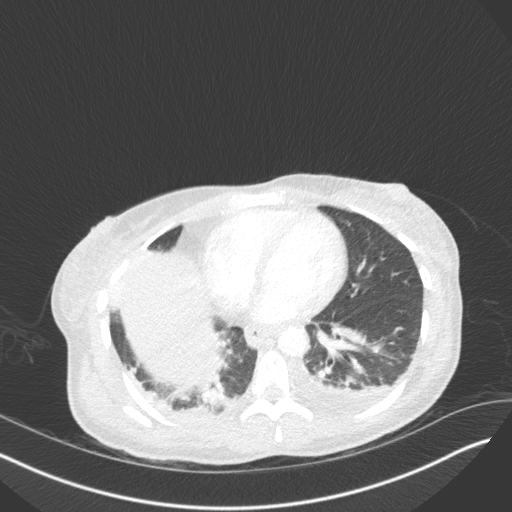
[im 84/152  lung]
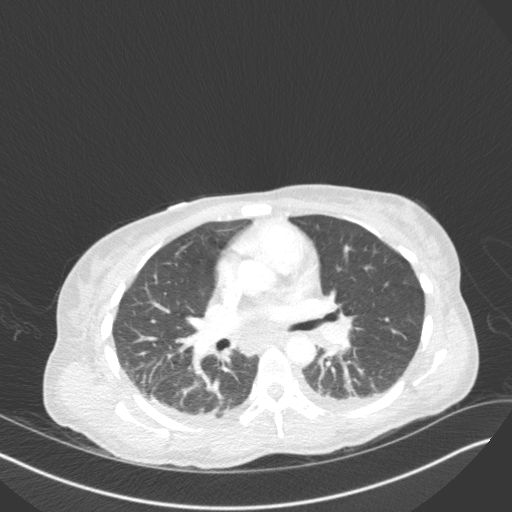
[im 96/152  lung]
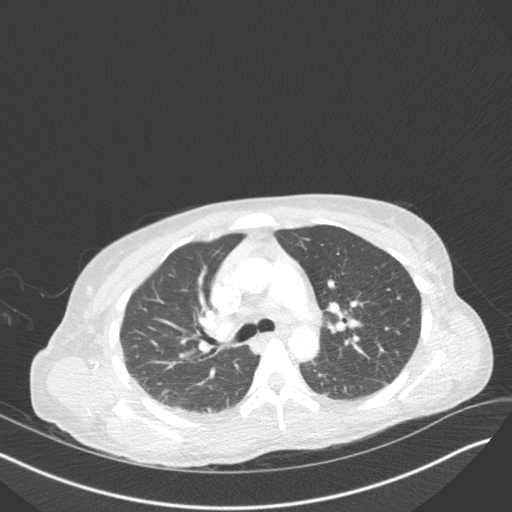
[im 107/152  mediastinal]
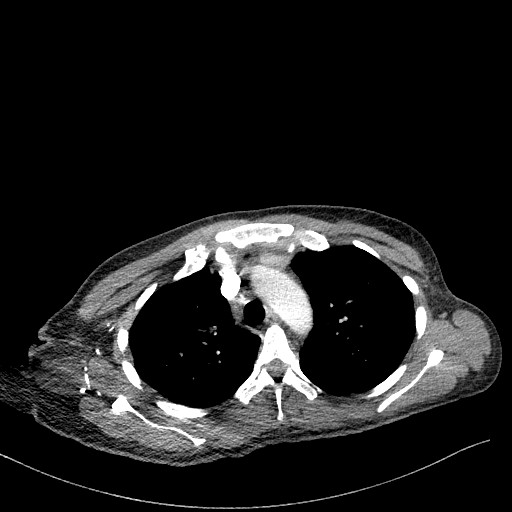
[im 107/152  lung]
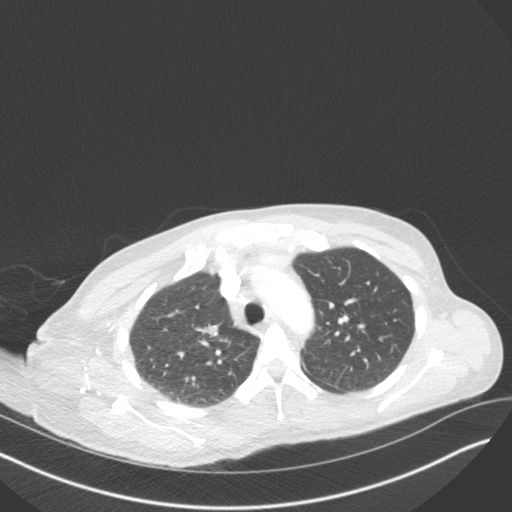
[im 118/152  lung]
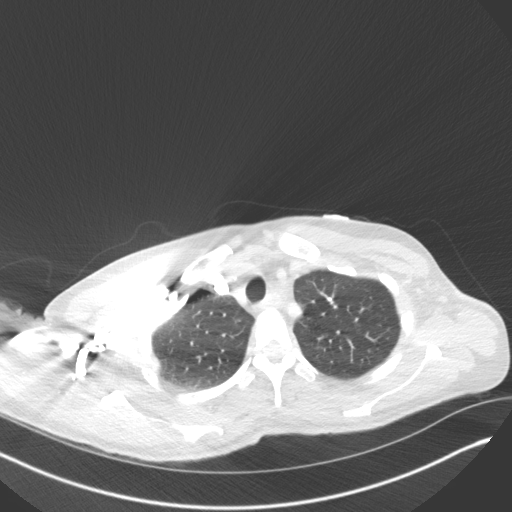
[im 129/152  lung]
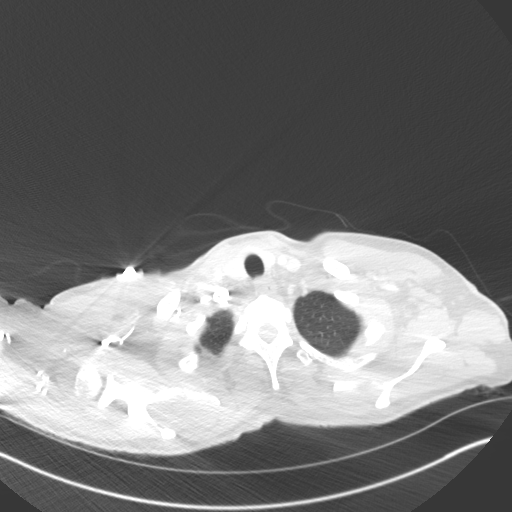
[im 140/152  lung]
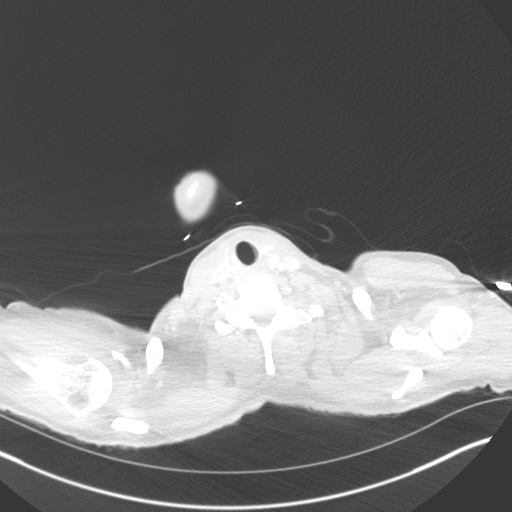

[Series 6: chest with 2mm st cor · coronal · 0.59mm/px · 3 of 151 slices shown]
[im 31/151  lung]
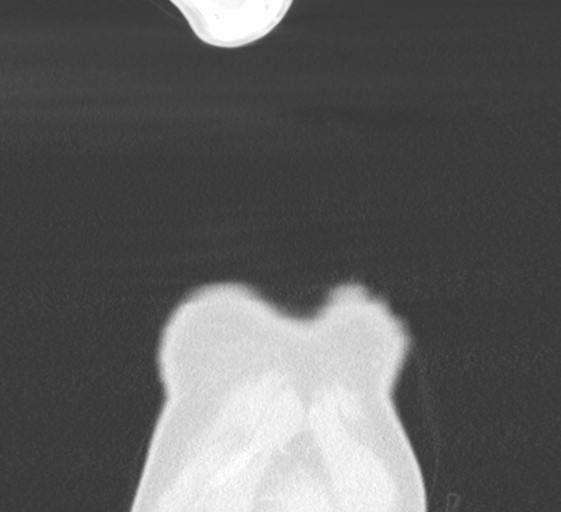
[im 61/151  lung]
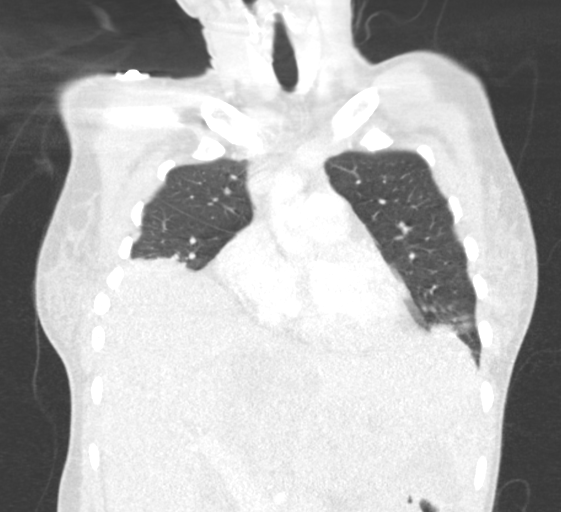
[im 91/151  lung]
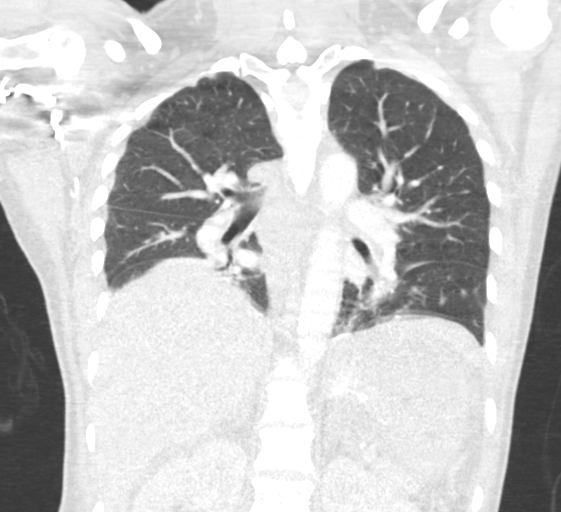

[15 of 36 positions shown; findings below may reference images not displayed]

FINDINGS: Cardiovascular: No significant vascular findings. Normal heart size.
No pericardial effusion.

Mediastinum/Nodes: Thyroid gland is unremarkable. Possible 2 cm
subcarinal lymph node is noted. Wall thickening of distal esophagus
is noted concerning for esophagitis.

Lungs/Pleura: No pneumothorax is noted. Small left pleural effusion
is noted. Mild bilateral posterior basilar subsegmental atelectasis
or infiltrates are noted.

Upper Abdomen: As noted on prior CT, hepatic masses are noted
concerning for malignancy or metastatic disease.

Musculoskeletal: No chest wall abnormality. No acute or significant
osseous findings.
IMPRESSION: Small left pleural effusion is noted. Mild bilateral posterior
basilar subsegmental atelectasis or infiltrates are noted.

Possible 2 cm subcarinal lymph node is noted which may represent
metastatic disease.

Wall thickening of distal esophagus is noted concerning for
esophagitis. Endoscopy is recommended for further evaluation.

Hepatic masses are again noted as described on prior CT scan,
concerning for malignancy or metastatic disease.

## 2020-09-27 MED ORDER — MIDAZOLAM HCL 2 MG/2ML IJ SOLN
INTRAMUSCULAR | Status: AC
  Filled 2020-09-27: qty 2

## 2020-09-27 MED ORDER — ADULT MULTIVITAMIN W/MINERALS CH
1.0000 | ORAL_TABLET | Freq: Every day | ORAL | Status: DC
  Administered 2020-09-27 – 2020-10-05 (×9): 1 via ORAL
  Filled 2020-09-27 (×9): qty 1

## 2020-09-27 MED ORDER — FENTANYL CITRATE (PF) 100 MCG/2ML IJ SOLN
INTRAMUSCULAR | Status: AC
  Filled 2020-09-27: qty 2

## 2020-09-27 MED ORDER — GELATIN ABSORBABLE 12-7 MM EX MISC
CUTANEOUS | Status: AC
  Filled 2020-09-27: qty 1

## 2020-09-27 MED ORDER — MORPHINE SULFATE (PF) 2 MG/ML IV SOLN
2.0000 mg | INTRAVENOUS | Status: DC | PRN
  Administered 2020-09-27 – 2020-10-04 (×43): 2 mg via INTRAVENOUS
  Filled 2020-09-27 (×45): qty 1

## 2020-09-27 MED ORDER — LIDOCAINE HCL (PF) 1 % IJ SOLN
INTRAMUSCULAR | Status: AC
  Filled 2020-09-27: qty 30

## 2020-09-27 MED ORDER — FENTANYL CITRATE (PF) 100 MCG/2ML IJ SOLN
INTRAMUSCULAR | Status: AC | PRN
  Administered 2020-09-27 (×2): 25 ug via INTRAVENOUS

## 2020-09-27 MED ORDER — POTASSIUM CHLORIDE IN NACL 40-0.9 MEQ/L-% IV SOLN
INTRAVENOUS | Status: DC
  Filled 2020-09-27 (×9): qty 1000

## 2020-09-27 MED ORDER — PIPERACILLIN-TAZOBACTAM 3.375 G IVPB
3.3750 g | Freq: Three times a day (TID) | INTRAVENOUS | Status: DC
  Administered 2020-09-27 – 2020-10-01 (×14): 3.375 g via INTRAVENOUS
  Filled 2020-09-27 (×15): qty 50

## 2020-09-27 MED ORDER — MIDAZOLAM HCL 2 MG/2ML IJ SOLN
INTRAMUSCULAR | Status: AC | PRN
  Administered 2020-09-27: 1 mg via INTRAVENOUS
  Administered 2020-09-27: 0.5 mg via INTRAVENOUS

## 2020-09-27 MED ORDER — ENSURE ENLIVE PO LIQD
237.0000 mL | Freq: Three times a day (TID) | ORAL | Status: DC
  Administered 2020-09-27 – 2020-10-04 (×20): 237 mL via ORAL

## 2020-09-27 MED ORDER — IOHEXOL 300 MG/ML  SOLN
75.0000 mL | Freq: Once | INTRAMUSCULAR | Status: AC | PRN
  Administered 2020-09-27: 75 mL via INTRAVENOUS

## 2020-09-27 NOTE — Plan of Care (Signed)
  Problem: Education: Goal: Knowledge of General Education information will improve Description: Including pain rating scale, medication(s)/side effects and non-pharmacologic comfort measures Outcome: Progressing   Problem: Clinical Measurements: Goal: Diagnostic test results will improve Outcome: Progressing   Problem: Coping: Goal: Level of anxiety will decrease Outcome: Progressing   Problem: Pain Managment: Goal: General experience of comfort will improve Outcome: Progressing   

## 2020-09-27 NOTE — Progress Notes (Addendum)
Daily Rounding Note  09/27/2020, 12:19 PM  LOS: 1 day   SUBJECTIVE:   Chief complaint:  abdominal pain, sigmoid region mass    Abd pain ongoing.  Tolerating small amount of solid food, "taking it easy" to avoid inducing increased abd pain.  No BM's.  No nausea  OBJECTIVE:         Vital signs in last 24 hours:    Temp:  [98 F (36.7 C)-99.6 F (37.6 C)] 98.9 F (37.2 C) (07/28 0656) Pulse Rate:  [98-121] 102 (07/28 1135) Resp:  [14-25] 18 (07/28 1135) BP: (102-120)/(66-77) 103/71 (07/28 1135) SpO2:  [94 %-100 %] 98 % (07/28 1135) Weight:  [56.7 kg] 56.7 kg (07/27 2022) Last BM Date: 09/25/20 Filed Weights   09/26/20 2022  Weight: 56.7 kg   General: NAD   Heart: RRR Chest: no labored breathing or cough Abdomen: slight distention.  Diffuse tenderness  Extremities: no CCE Neuro/Psych:  oriented x 3.  Moves all 4 limbs.  No tremor.    Intake/Output from previous day: No intake/output data recorded.  Intake/Output this shift: No intake/output data recorded.  Lab Results: Recent Labs    09/25/20 1641 09/26/20 0350 09/27/20 0343  WBC 15.0* 12.7* 10.9*  HGB 11.7* 9.7* 8.5*  HCT 36.5 29.4* 25.4*  PLT 319 240 283   BMET Recent Labs    09/25/20 1641 09/26/20 0350 09/27/20 0343  NA 132* 131* 131*  K 3.9 3.9 3.3*  CL 94* 100 98  CO2 24 20* 25  GLUCOSE 116* 108* 119*  BUN 43* 44* 10  CREATININE 2.54* 1.52* 0.78  CALCIUM 8.8* 8.1* 8.3*   LFT Recent Labs    09/25/20 1641 09/26/20 0350 09/27/20 0343  PROT 8.1 6.2* 5.8*  ALBUMIN 2.6* 2.0* 1.8*  AST 133* 116* 55*  ALT 231* 186* 131*  ALKPHOS 79 62 60  BILITOT 0.8 0.8 0.7  BILIDIR  --  0.3*  --   IBILI  --  0.5  --    PT/INR Recent Labs    09/27/20 0725  LABPROT 14.7  INR 1.2   Hepatitis Panel No results for input(s): HEPBSAG, HCVAB, HEPAIGM, HEPBIGM in the last 72 hours.  Studies/Results: CT ABDOMEN PELVIS WO CONTRAST  Result Date:  09/25/2020 CLINICAL DATA:  Abdominal pain, acute nonlocalized abdominal pain associated with muscle spasms. EXAM: CT ABDOMEN AND PELVIS WITHOUT CONTRAST TECHNIQUE: Multidetector CT imaging of the abdomen and pelvis was performed following the standard protocol without IV contrast. COMPARISON:  February 24, 2016 FINDINGS: Lower chest: Basilar atelectasis/early consolidation on the RIGHT. No effusion. Hepatobiliary: Diffuse central low attenuation in the liver spanning medial an lateral segment of LEFT hepatic lobe along the fissure for false form ligament measuring up to 9.3 x 4.3 cm in total. This appears either anterior 2 or also involving intrahepatic inferior vena cava tracking towards the confluence of the hepatic veins. No pericholecystic stranding. No additional lesions visualized. Pancreas: Not well visualized. Grossly unremarkable and without signs of inflammation. Spleen: Spleen normal size and contour though with serosal disease suspected along the anterior margin. See below. Adrenals/Urinary Tract: Adrenal glands are normal. Kidneys with smooth contours. No hydronephrosis. Urinary bladder is collapsed. Stomach/Bowel: Gastric thickening is suggested in the area of the gastric antrum with some perigastric stranding. Mild distension of small bowel loops in the abdomen without signs of gross obstruction. Colonic loops decompressed distal to the transverse colon. Suggestion of low-attenuation focus along the margin of the sigmoid (  image 67/3) this is suspicious for serosal implant with an adjacent soft tissue nodule in the sigmoid mesentery (image 66/6) 2.3 x 1.5 cm. Mid sigmoid abnormality as discussed below. Visualized portions of the appendix are normal. Vascular/Lymphatic: Calcified atheromatous plaque, scattered throughout the normal caliber abdominal aorta. Smooth contour of the IVC. Scattered small lymph nodes in the retroperitoneum. None with pathologic enlargement. No gross adenopathy in the abdomen  though with ill-defined soft tissue in the porta hepatis that may represent conglomerate adenopathy the largest potentially 13 mm short axis (image 28/3) Scattered lymph nodes adjacent to rectosigmoid colon best seen on coronal image 76, adjacent to either soft tissue implant or large lymph node. Adjacent to the dominant soft tissue nodule adjacent to the colon there is question of colonic thickening from which this area arises. This is at the level of the mid sigmoid colon. Ovaries along the bilateral pelvic sidewalls. Reproductive: Ovaries along the bilateral pelvic sidewalls. No adnexal mass. Uterus unremarkable. Other: While there is no ascites. There is fascial thickening with nodularity, for example on image 28 of series 3 6 mm thickening of the abdominal fascia adjacent to the stomach. Nodularity tracking along the anterior and lateral transversalis and in the lateral conal fascia in the LEFT hemiabdomen. Indistinct appearance of the omental fat raising the question of omental disease. Musculoskeletal: No acute musculoskeletal findings. Signs of sacroiliitis with asymmetric involvement of the RIGHT sacral iliac joint. No destructive bone finding or acute bone process. IMPRESSION: 1. 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 Hepatic MRI or CT with contrast may be helpful as the patient is able for further evaluation. 2. Question of gastric antral thickening.  Areas not well assessed. 3. Signs of sacroiliitis with asymmetric involvement of the RIGHT sacral iliac joint. Correlate with any clinical or laboratory evidence of seronegative spondyloarthropathy. 4. Juxta diaphragmatic airspace disease in the RIGHT chest favored to represent juxta diaphragmatic atelectasis. 5. Aortic atherosclerosis. Electronically Signed   By: Zetta Bills M.D.   On: 09/25/2020 19:04   MR PELVIS WO CONTRAST  Addendum Date: 09/26/2020    ADDENDUM REPORT: 09/26/2020 19:50 ADDENDUM: These results were called by telephone at the time of interpretation on 09/26/2020 at 7:49 pm to provider hospitalist DR. SHALHOUB, who verbally acknowledged these results. Electronically Signed   By: Ilona Sorrel M.D.   On: 09/26/2020 19:50   Result Date: 09/26/2020 CLINICAL DATA:  50 year old female with acute abdominal pain. Indeterminate perisigmoid masses and liver masses on noncontrast CT from 1 day prior. By report, no sigmoid mass identified on colonoscopy performed in May 2022. EXAM: MRI PELVIS WITHOUT CONTRAST TECHNIQUE: Multiplanar multisequence MR imaging of the pelvis was performed. No intravenous contrast was administered. Study discontinued early at the patient's request, with intravenous contrast not administered and postcontrast sequences not obtained. COMPARISON:  07/24/2020 MRI pelvis.  09/25/2020 CT abdomen/pelvis. FINDINGS: Urinary Tract:  Normal bladder and urethra. Bowel: There is a 3.0 x 2.1 x 2.5 cm mildly T2 hyperintense mass in the posterior left pelvis adherent to the serosa of the sigmoid colon (series 5/image 4), with the suggestion of a tiny amount of nondependent internal gas. There is a separate nearby 2.2 x 1.7 x 1.3 cm T2 hyperintense mass in the sigmoid mesentery (series 5/image 1). There is a separate more anteriorly located thick walled 4.3 x 2.9 x 2.8 cm collection adherent to the sigmoid serosa with air-fluid level (series 3/image 21). There is mild  wall thickening of the mid sigmoid colon. Vascular/Lymphatic: No appreciable acute vascular abnormality on this noncontrast scan. No pathologically enlarged inguinal nodes. Reproductive: Normal size anteverted uterus measuring 6.2 x 3.1 x 3.9 cm. No uterine fibroids. Thin endometrium (2 mm thickness) with no discrete endometrial mass and no endometrial cavity fluid. No discrete mass of the uterine cervix, noting this study is not tailored for evaluation of the uterus. The ovaries  appear symmetric and small (series 6/image 3 on the right and image 5 on the left). Other: Mild thickening of the bilateral pelvic peritoneum (series 7/image 13 on the right and image 14 on the left). No pelvic ascites. Musculoskeletal: No aggressive appearing focal osseous lesions. IMPRESSION: 1. Limited noncontrast MRI pelvis study, discontinued early at the patient's request. 2. Thick-walled 4.3 x 2.9 x 2.8 cm collection adherent to the proximal sigmoid serosa in the left anterior pelvis with air-fluid level. Separate 3.0 x 2.1 x 2.5 cm T2 hyperintense mass in the posterior left pelvis adherent to the sigmoid serosa with suggestion of a tiny amount of nondependent gas. Separate 2.2 cm T2 hyperintense mass in the sigmoid mesentery. These findings are indeterminate, with differential including gas-containing pelvic abscesses versus peritoneal metastatic disease of uncertain primary. Fistulous communication to the sigmoid colon not excluded. Surgical consultation suggested. 3. Mild thickening of the bilateral pelvic peritoneum, nonspecific, differential includes peritonitis vs. peritoneal carcinomatosis. Electronically Signed: By: Ilona Sorrel M.D. On: 09/26/2020 19:35   MR ABDOMEN WO CONTRAST  Addendum Date: 09/26/2020   ADDENDUM REPORT: 09/26/2020 19:49 ADDENDUM: These results were called by telephone at the time of interpretation on 09/26/2020 at 7:46 pm to provider DR. SHALHOUB, who verbally acknowledged these results. Upon further discussion with Dr. Cyd Silence, the patient is exhibiting clinical findings of sepsis with an elevated WBC count. The differential for the liver masses includes liver abscesses in addition to metastatic disease. Additionally the differential for the peritoneal thickening includes infectious peritonitis in addition to carcinomatosis. IR consultation for consideration of percutaneous biopsy/drainage of these liver lesions is suggested. Electronically Signed   By: Ilona Sorrel M.D.    On: 09/26/2020 19:49   Result Date: 09/26/2020 CLINICAL DATA:  Crohn disease. Indeterminate liver masses on unenhanced CT from earlier today. EXAM: MRI ABDOMEN WITHOUT CONTRAST TECHNIQUE: Multiplanar multisequence MR imaging was performed without the administration of intravenous contrast. Patient unable to tolerate full exam or IV contrast. Only axial and coronal T2 weighted imaging obtained. COMPARISON:  09/25/2020 CT abdomen/pelvis. FINDINGS: Motion degraded incomplete MRI abdomen study performed without IV contrast. Lower chest: Streaky curvilinear opacities at the dependent right greater than left lung bases bilaterally, not well evaluated by MRI. Hepatobiliary: Similar heterogeneous T2 signal intensity liver masses measuring 5.6 x 4.7 cm in the caudate lobe (series 15/image 16) and 5.3 x 4.0 cm in the posterior left liver (series 15/image 19). Normal gallbladder with no cholelithiasis. Mild segmental intrahepatic biliary ductal dilatation in the lateral segment left liver lobe. No significant intrahepatic biliary ductal dilatation in the right liver. Common bile duct diameter 3 mm. No choledocholithiasis. No appreciable biliary masses or strictures. Pancreas: No pancreatic mass or duct dilation.  No pancreas divisum. Spleen: Normal size. No mass. Adrenals/Urinary Tract: Normal adrenals. No hydronephrosis. Simple appearing homogeneous T2 hyperintense left renal cysts, largest 2.3 cm in the lower left kidney. No overtly suspicious renal masses. Stomach/Bowel: Normal non-distended stomach. Visualized small and large bowel is normal caliber, with no bowel wall thickening. Vascular/Lymphatic: Nonaneurysmal abdominal aorta. No pathologically enlarged lymph nodes in the  abdomen. Other: Trace abdominal ascites. Scattered mild generalized peritoneal thickening, for example in the right pericolic gutter (series 91/YOMAY 34) and left upper quadrant (series 15/image 25). No focal drainable fluid collection.  Musculoskeletal: No aggressive appearing focal osseous lesions. IMPRESSION: 1. Limited motion degraded incomplete MRI abdomen study performed without IV contrast, discontinued prior to study completion at the patient's request. 2. Two similar indeterminate heterogeneous liver masses measuring 5.6 cm in the caudate lobe and 5.3 cm in the posterior left liver, incompletely evaluated. Metastatic disease not excluded. Consider IR consultation for potential ultrasound-guided liver biopsy. If clinically feasible, liver protocol CT abdomen and pelvis without and with IV contrast may be considered for further evaluation prior to potential biopsy. 3. Mild generalized peritoneal thickening. Trace abdominal ascites. 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 4. Streaky curvilinear opacities at the dependent right greater than left lung bases, not well evaluated by MRI. Chest CT may be obtained for further evaluation as clinically warranted. Electronically Signed: By: Ilona Sorrel M.D. On: 09/26/2020 18:25    Scheduled Meds:  lidocaine (PF)       mesalamine  1.2 g Oral BID   Continuous Infusions:  0.9 % NaCl with KCl 40 mEq / L 75 mL/hr at 09/27/20 1222   piperacillin-tazobactam (ZOSYN)  IV Stopped (09/27/20 1039)   PRN Meds:.morphine injection, ondansetron **OR** ondansetron (ZOFRAN) IV   ASSESMENT:   Abdominal pain.  Multiple imaging abnormalities including liver masses, generalized peritoneal thickening, trace abdominal ascites fluid collection adherent to sigmoid serosa.  Left pelvic mass also adherent to sigmoid serosa.  Differential includes gas containing pelvic abscesses versus peritoneal metastatic disease of uncertain primary.  Fistulous communication to sigmoid colon not excluded.  R/O metastatic dz vs abscesses.  Day 2 empiric Zosyn.    06/22/2020 colonoscopy with inflammatory polyp resection, mild  pancolitis and ileitis, probably Crohn's, started on Lialda.  Multiple drainage procedures over the last few years for vulvar and perirectal abscesses.  Hyponatremia.  Hypokalemia.     IDA.  Did not tolerate oral iron and only took this for a couple of weeks in May. Hgb 11.7 >> 8.5 over the last 3 days.   PLAN   Underwent 100 mL paracentesis today, fluid brown, opaque, sent for cytology and culture pending.  Dr. Anselm Pancoast elected not to perform liver biopsy but did identify subtle liver lesions at ultrasound imaging during procedure.  Await results of cytology, if unrevealing ? Is liver biospy feasible?     CT chest to assess for pulm dz. ordered CEA, CA125, CA 19-9.    Nicole Browning  09/27/2020, 12:19 PM Phone [phone]    Attending physician's note   I have taken an interval history, reviewed the chart and examined the patient. I agree with the Advanced Practitioner's note, impression and recommendations.   MRI reviewed Previous colonoscopy reviewed.  The sigmoid colon appears to be normal.  She had good preparation.  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  Ovaries appeared to be normal as well.  Underlying Crohn's disease with H/O recurrent abscesses  Plan: -Follow cytology and ascitic fluid analysis -Check CEA, CA 19-9, CA125 -CT chest as suggested by radiology. -She may eventually require EGD (gastric thickening) but not urgently.  I do not think she needs repeat colon.  Would only do if diagnosis  is still in doubt. -Advance diet.  Carmell Austria, MD [facility] [phone]

## 2020-09-27 NOTE — Progress Notes (Signed)
   09/27/20 0656  Assess: MEWS Score  Temp 98.9 F (37.2 C)  BP 113/67  Pulse Rate (!) 108  Resp 18  Level of Consciousness Alert  SpO2 96 %  O2 Device Room Air  Assess: MEWS Score  MEWS Temp 0  MEWS Systolic 0  MEWS Pulse 1  MEWS RR 0  MEWS LOC 0  MEWS Score 1  MEWS Score Color Green  Treat  Pain Scale 0-10  Pain Score 7  Pain Type Acute pain  Pain Location Abdomen  Pain Orientation Right  Pain Intervention(s) MD notified (Comment)  Notify: Provider  Provider response See new orders  Date of Provider Response 09/27/20  Time of Provider Response [id number]  Document  Patient Outcome Stabilized after interventions  Progress note created (see row info) Yes

## 2020-09-27 NOTE — Progress Notes (Signed)
Pharmacy Antibiotic Note  Nicole Browning is a 50 y.o. female admitted on 09/25/2020 with  possible intra-abdominal abscesses .  Pharmacy has been consulted for Zosyn dosing. Getting aspiration by IR today. WBC elevated. CrCl ~35-40.   Plan: Zosyn 3.375G IV q8h to be infused over 4 hours Trend WBC, temp, renal function  F/U infectious work-up  Height: 5' 4"  (162.6 cm) Weight: 56.7 kg (125 lb) IBW/kg (Calculated) : 54.7  Temp (24hrs), Avg:98.6 F (37 C), Min:98 F (36.7 C), Max:99.6 F (37.6 C)  Recent Labs  Lab 09/25/20 1641 09/26/20 0350 09/26/20 2140  WBC 15.0* 12.7*  --   CREATININE 2.54* 1.52*  --   LATICACIDVEN  --   --  1.1    Estimated Creatinine Clearance: 38.2 mL/min (A) (by C-G formula based on SCr of 1.52 mg/dL (H)).    Allergies  Allergen Reactions   Bactrim [Sulfamethoxazole-Trimethoprim] Hives    Immediate body wide hives   Percocet [Oxycodone-Acetaminophen] Nausea Only   Valium [Diazepam] Nausea Only    Narda Bonds, PharmD, BCPS Clinical Pharmacist Phone: [phone]

## 2020-09-27 NOTE — Plan of Care (Signed)

## 2020-09-27 NOTE — Consult Note (Signed)
Chief Complaint: Patient was seen in consultation today for liver lesion  Referring Physician(s): Dr. Cyd Silence  Supervising Physician: Markus Daft  Patient Status: [facility] - In-pt  History of Present Illness: Nicole Browning is a 50 y.o. female with past medical history of anemia, GERD, IBS/UC, perirectal abscess who presented to [facility] ED with nausea, vomiting, hypotension.  She was found to have AKI with elevated liver enzymes.    CT Abdomen Pelvis 7/26 showed: 1. 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 Hepatic MRI or CT with contrast may be helpful as the patient is able for further evaluation. 2. Question of gastric antral thickening.  Areas not well assessed. 3. Signs of sacroiliitis with asymmetric involvement of the RIGHT sacral iliac joint. Correlate with any clinical or laboratory evidence of seronegative spondyloarthropathy. 4. Juxta diaphragmatic airspace disease in the RIGHT chest favored to represent juxta diaphragmatic atelectasis. 5. Aortic atherosclerosis.  MR Pelvis 7/27 showed: 1. Limited noncontrast MRI pelvis study, discontinued early at the patient's request. 2. Thick-walled 4.3 x 2.9 x 2.8 cm collection adherent to the proximal sigmoid serosa in the left anterior pelvis with air-fluid level. Separate 3.0 x 2.1 x 2.5 cm T2 hyperintense mass in the posterior left pelvis adherent to the sigmoid serosa with suggestion of a tiny amount of nondependent gas. Separate 2.2 cm T2 hyperintense mass in the sigmoid mesentery. These findings are indeterminate, with differential including gas-containing pelvic abscesses versus peritoneal metastatic disease of uncertain primary. Fistulous communication to the sigmoid colon not excluded. Surgical consultation suggested. 3. Mild thickening of the bilateral pelvic peritoneum, nonspecific, differential includes peritonitis vs.  peritoneal carcinomatosis.  Imaging unable to rule out hepatic abscess.  Case reviewed by Dr. Vernard Gambles who approves patient for hepatic abscess aspiration/drainage, possible liver lesion biopsy.  Nicole Browning presents for procedure today in [facility].  She is aware of the goals of the procedure.  She has been NPO with only sips for meds.   Past Medical History:  Diagnosis Date   Anemia    GERD (gastroesophageal reflux disease)    IBS (irritable bowel syndrome)    Perirectal abscess    Protein-calorie malnutrition ([place]) 02/29/2016   Tobacco use    UC (ulcerative colitis) ([employer])     Past Surgical History:  Procedure Laterality Date   IRRIGATION AND DEBRIDEMENT ABSCESS N/A 02/24/2016   Procedure: IRRIGATION AND DEBRIDEMENT ABSCESS;  Surgeon: Stark Klein, MD;  Location: [place] OR;  Service: General;  Laterality: N/A;    Allergies: Bactrim [sulfamethoxazole-trimethoprim], Percocet [oxycodone-acetaminophen], and Valium [diazepam]  Medications: Prior to Admission medications   Medication Sig Start Date End Date Taking? Authorizing Provider  aspirin EC 325 MG tablet Take 325 mg by mouth every 6 (six) hours as needed for moderate pain.   Yes [provider]  bismuth subsalicylate (PEPTO BISMOL) 262 MG/15ML suspension Take 30 mLs by mouth every 6 (six) hours as needed for indigestion.   Yes [provider]  ibuprofen (ADVIL) 200 MG tablet Take 200 mg by mouth every 6 (six) hours as needed for moderate pain.   Yes [provider]  mesalamine (LIALDA) 1.2 g EC tablet Take 1 tablet (1.2 g total) by mouth in the morning and at bedtime. 07/11/20  Yes Noralyn Pick, NP  Multiple Vitamins-Minerals (WOMENS MULTIVITAMIN PO) Take 1 tablet by mouth daily.   Yes [provider]  naproxen sodium (ALEVE) 220 MG tablet Take 220 mg by mouth daily  as needed (pain).   Yes [provider]  POTASSIUM PO Take 1 tablet by mouth daily.   Yes [provider]  Vitamin D, Ergocalciferol, (DRISDOL) 1.25 MG (50000 UNIT) CAPS capsule Take 1 capsule (50,000 Units total) by mouth every 7 (seven) days. TAKE ONE CAPSULE BY MOUTH ONCE WEEKLY 07/22/20  Yes Noralyn Pick, NP  VITAMIN E PO Take 1 tablet by mouth daily.   Yes [provider]  doxycycline (VIBRAMYCIN) 100 MG capsule Take 1 capsule (100 mg total) by mouth 2 (two) times daily. Patient not taking: Reported on 09/25/2020 07/09/20   Scot Jun, FNP     Family History  Problem Relation Age of Onset   Diabetes Mother    COPD Father    Leukemia Brother    Colon cancer Neg Hx    Colon polyps Neg Hx    Esophageal cancer Neg Hx    Rectal cancer Neg Hx    Stomach cancer Neg Hx     Social History   Socioeconomic History   Marital status: Single    Spouse name: Not on file   Number of children: Not on file   Years of education: Not on file   Highest education level: Not on file  Occupational History   Not on file  Tobacco Use   Smoking status: Every Day    Packs/day: 0.33    Years: 28.00    Pack years: 9.24    Types: Cigarettes   Smokeless tobacco: Never  Vaping Use   Vaping Use: Never used  Substance and Sexual Activity   Alcohol use: Yes    Alcohol/week: 2.0 standard drinks    Types: 2 Glasses of wine per week   Drug use: No   Sexual activity: Not Currently    Birth control/protection: Injection  Other Topics Concern   Not on file  Social History Narrative   Not on file   Social Determinants of Health   Financial Resource Strain: Not on file  Food Insecurity: Not on file  Transportation Needs: Not on file  Physical Activity: Not on file  Stress: Not on file  Social Connections: Not on file     Review of Systems: A 12 point ROS discussed and pertinent positives are indicated in the HPI above.  All other systems are negative.  Review of Systems  Constitutional:  Negative for fatigue and fever.  Respiratory:  Negative for cough  and shortness of breath.   Gastrointestinal:  Negative for abdominal pain.  Genitourinary:  Negative for dysuria.  Musculoskeletal:  Negative for back pain.  Psychiatric/Behavioral:  Negative for behavioral problems and confusion.    Vital Signs: BP 113/67 (BP Location: Left Arm)   Pulse (!) 108   Temp 98.9 F (37.2 C) (Oral)   Resp 18   Ht 5' 4"  (1.626 m)   Wt 125 lb (56.7 kg)   LMP  (LMP Unknown)   SpO2 96%   BMI 21.46 kg/m   Physical Exam Vitals and nursing note reviewed.  Constitutional:      General: She is not in acute distress.    Appearance: She is well-developed and normal weight. She is not ill-appearing.  Cardiovascular:     Rate and Rhythm: Regular rhythm. Tachycardia present.  Pulmonary:     Effort: Pulmonary effort is normal. No respiratory distress.     Breath sounds: Normal breath sounds.  Abdominal:     General: Abdomen is flat.     Palpations: Abdomen  is rigid.     Tenderness: There is abdominal tenderness in the left upper quadrant.  Skin:    General: Skin is warm and dry.  Neurological:     General: No focal deficit present.     Mental Status: She is alert and oriented to person, place, and time.     MD Evaluation Airway: WNL Heart: WNL Abdomen: WNL Chest/ Lungs: WNL ASA  Classification: 2 Mallampati/Airway Score: Two   Imaging: CT ABDOMEN PELVIS WO CONTRAST  Result Date: 09/25/2020 CLINICAL DATA:  Abdominal pain, acute nonlocalized abdominal pain associated with muscle spasms. EXAM: CT ABDOMEN AND PELVIS WITHOUT CONTRAST TECHNIQUE: Multidetector CT imaging of the abdomen and pelvis was performed following the standard protocol without IV contrast. COMPARISON:  February 24, 2016 FINDINGS: Lower chest: Basilar atelectasis/early consolidation on the RIGHT. No effusion. Hepatobiliary: Diffuse central low attenuation in the liver spanning medial an lateral segment of LEFT hepatic lobe along the fissure for false form ligament measuring up to 9.3 x  4.3 cm in total. This appears either anterior 2 or also involving intrahepatic inferior vena cava tracking towards the confluence of the hepatic veins. No pericholecystic stranding. No additional lesions visualized. Pancreas: Not well visualized. Grossly unremarkable and without signs of inflammation. Spleen: Spleen normal size and contour though with serosal disease suspected along the anterior margin. See below. Adrenals/Urinary Tract: Adrenal glands are normal. Kidneys with smooth contours. No hydronephrosis. Urinary bladder is collapsed. Stomach/Bowel: Gastric thickening is suggested in the area of the gastric antrum with some perigastric stranding. Mild distension of small bowel loops in the abdomen without signs of gross obstruction. Colonic loops decompressed distal to the transverse colon. Suggestion of low-attenuation focus along the margin of the sigmoid (image 67/3) this is suspicious for serosal implant with an adjacent soft tissue nodule in the sigmoid mesentery (image 66/6) 2.3 x 1.5 cm. Mid sigmoid abnormality as discussed below. Visualized portions of the appendix are normal. Vascular/Lymphatic: Calcified atheromatous plaque, scattered throughout the normal caliber abdominal aorta. Smooth contour of the IVC. Scattered small lymph nodes in the retroperitoneum. None with pathologic enlargement. No gross adenopathy in the abdomen though with ill-defined soft tissue in the porta hepatis that may represent conglomerate adenopathy the largest potentially 13 mm short axis (image 28/3) Scattered lymph nodes adjacent to rectosigmoid colon best seen on coronal image 76, adjacent to either soft tissue implant or large lymph node. Adjacent to the dominant soft tissue nodule adjacent to the colon there is question of colonic thickening from which this area arises. This is at the level of the mid sigmoid colon. Ovaries along the bilateral pelvic sidewalls. Reproductive: Ovaries along the bilateral pelvic  sidewalls. No adnexal mass. Uterus unremarkable. Other: While there is no ascites. There is fascial thickening with nodularity, for example on image 28 of series 3 6 mm thickening of the abdominal fascia adjacent to the stomach. Nodularity tracking along the anterior and lateral transversalis and in the lateral conal fascia in the LEFT hemiabdomen. Indistinct appearance of the omental fat raising the question of omental disease. Musculoskeletal: No acute musculoskeletal findings. Signs of sacroiliitis with asymmetric involvement of the RIGHT sacral iliac joint. No destructive bone finding or acute bone process. IMPRESSION: 1. 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 Hepatic MRI or CT with contrast may be helpful as the patient is able for further evaluation. 2. Question of gastric antral thickening.  Areas not well assessed. 3. Signs of  sacroiliitis with asymmetric involvement of the RIGHT sacral iliac joint. Correlate with any clinical or laboratory evidence of seronegative spondyloarthropathy. 4. Juxta diaphragmatic airspace disease in the RIGHT chest favored to represent juxta diaphragmatic atelectasis. 5. Aortic atherosclerosis. Electronically Signed   By: Zetta Bills M.D.   On: 09/25/2020 19:04   MR PELVIS WO CONTRAST  Addendum Date: 09/26/2020   ADDENDUM REPORT: 09/26/2020 19:50 ADDENDUM: These results were called by telephone at the time of interpretation on 09/26/2020 at 7:49 pm to provider hospitalist DR. SHALHOUB, who verbally acknowledged these results. Electronically Signed   By: Ilona Sorrel M.D.   On: 09/26/2020 19:50   Result Date: 09/26/2020 CLINICAL DATA:  50 year old female with acute abdominal pain. Indeterminate perisigmoid masses and liver masses on noncontrast CT from 1 day prior. By report, no sigmoid mass identified on colonoscopy performed in May 2022. EXAM: MRI PELVIS WITHOUT CONTRAST  TECHNIQUE: Multiplanar multisequence MR imaging of the pelvis was performed. No intravenous contrast was administered. Study discontinued early at the patient's request, with intravenous contrast not administered and postcontrast sequences not obtained. COMPARISON:  07/24/2020 MRI pelvis.  09/25/2020 CT abdomen/pelvis. FINDINGS: Urinary Tract:  Normal bladder and urethra. Bowel: There is a 3.0 x 2.1 x 2.5 cm mildly T2 hyperintense mass in the posterior left pelvis adherent to the serosa of the sigmoid colon (series 5/image 4), with the suggestion of a tiny amount of nondependent internal gas. There is a separate nearby 2.2 x 1.7 x 1.3 cm T2 hyperintense mass in the sigmoid mesentery (series 5/image 1). There is a separate more anteriorly located thick walled 4.3 x 2.9 x 2.8 cm collection adherent to the sigmoid serosa with air-fluid level (series 3/image 21). There is mild wall thickening of the mid sigmoid colon. Vascular/Lymphatic: No appreciable acute vascular abnormality on this noncontrast scan. No pathologically enlarged inguinal nodes. Reproductive: Normal size anteverted uterus measuring 6.2 x 3.1 x 3.9 cm. No uterine fibroids. Thin endometrium (2 mm thickness) with no discrete endometrial mass and no endometrial cavity fluid. No discrete mass of the uterine cervix, noting this study is not tailored for evaluation of the uterus. The ovaries appear symmetric and small (series 6/image 3 on the right and image 5 on the left). Other: Mild thickening of the bilateral pelvic peritoneum (series 7/image 13 on the right and image 14 on the left). No pelvic ascites. Musculoskeletal: No aggressive appearing focal osseous lesions. IMPRESSION: 1. Limited noncontrast MRI pelvis study, discontinued early at the patient's request. 2. Thick-walled 4.3 x 2.9 x 2.8 cm collection adherent to the proximal sigmoid serosa in the left anterior pelvis with air-fluid level. Separate 3.0 x 2.1 x 2.5 cm T2 hyperintense mass in the  posterior left pelvis adherent to the sigmoid serosa with suggestion of a tiny amount of nondependent gas. Separate 2.2 cm T2 hyperintense mass in the sigmoid mesentery. These findings are indeterminate, with differential including gas-containing pelvic abscesses versus peritoneal metastatic disease of uncertain primary. Fistulous communication to the sigmoid colon not excluded. Surgical consultation suggested. 3. Mild thickening of the bilateral pelvic peritoneum, nonspecific, differential includes peritonitis vs. peritoneal carcinomatosis. Electronically Signed: By: Ilona Sorrel M.D. On: 09/26/2020 19:35   MR ABDOMEN WO CONTRAST  Addendum Date: 09/26/2020   ADDENDUM REPORT: 09/26/2020 19:49 ADDENDUM: These results were called by telephone at the time of interpretation on 09/26/2020 at 7:46 pm to provider DR. SHALHOUB, who verbally acknowledged these results. Upon further discussion with Dr. Cyd Silence, the patient is exhibiting clinical findings of sepsis with an  elevated WBC count. The differential for the liver masses includes liver abscesses in addition to metastatic disease. Additionally the differential for the peritoneal thickening includes infectious peritonitis in addition to carcinomatosis. IR consultation for consideration of percutaneous biopsy/drainage of these liver lesions is suggested. Electronically Signed   By: Ilona Sorrel M.D.   On: 09/26/2020 19:49   Result Date: 09/26/2020 CLINICAL DATA:  Crohn disease. Indeterminate liver masses on unenhanced CT from earlier today. EXAM: MRI ABDOMEN WITHOUT CONTRAST TECHNIQUE: Multiplanar multisequence MR imaging was performed without the administration of intravenous contrast. Patient unable to tolerate full exam or IV contrast. Only axial and coronal T2 weighted imaging obtained. COMPARISON:  09/25/2020 CT abdomen/pelvis. FINDINGS: Motion degraded incomplete MRI abdomen study performed without IV contrast. Lower chest: Streaky curvilinear opacities at  the dependent right greater than left lung bases bilaterally, not well evaluated by MRI. Hepatobiliary: Similar heterogeneous T2 signal intensity liver masses measuring 5.6 x 4.7 cm in the caudate lobe (series 15/image 16) and 5.3 x 4.0 cm in the posterior left liver (series 15/image 19). Normal gallbladder with no cholelithiasis. Mild segmental intrahepatic biliary ductal dilatation in the lateral segment left liver lobe. No significant intrahepatic biliary ductal dilatation in the right liver. Common bile duct diameter 3 mm. No choledocholithiasis. No appreciable biliary masses or strictures. Pancreas: No pancreatic mass or duct dilation.  No pancreas divisum. Spleen: Normal size. No mass. Adrenals/Urinary Tract: Normal adrenals. No hydronephrosis. Simple appearing homogeneous T2 hyperintense left renal cysts, largest 2.3 cm in the lower left kidney. No overtly suspicious renal masses. Stomach/Bowel: Normal non-distended stomach. Visualized small and large bowel is normal caliber, with no bowel wall thickening. Vascular/Lymphatic: Nonaneurysmal abdominal aorta. No pathologically enlarged lymph nodes in the abdomen. Other: Trace abdominal ascites. Scattered mild generalized peritoneal thickening, for example in the right pericolic gutter (series 30/SPQZR 34) and left upper quadrant (series 15/image 25). No focal drainable fluid collection. Musculoskeletal: No aggressive appearing focal osseous lesions. IMPRESSION: 1. Limited motion degraded incomplete MRI abdomen study performed without IV contrast, discontinued prior to study completion at the patient's request. 2. Two similar indeterminate heterogeneous liver masses measuring 5.6 cm in the caudate lobe and 5.3 cm in the posterior left liver, incompletely evaluated. Metastatic disease not excluded. Consider IR consultation for potential ultrasound-guided liver biopsy. If clinically feasible, liver protocol CT abdomen and pelvis without and with IV contrast may be  considered for further evaluation prior to potential biopsy. 3. Mild generalized peritoneal thickening. Trace abdominal ascites. 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 4. Streaky curvilinear opacities at the dependent right greater than left lung bases, not well evaluated by MRI. Chest CT may be obtained for further evaluation as clinically warranted. Electronically Signed: By: Ilona Sorrel M.D. On: 09/26/2020 18:25    Labs:  CBC: Recent Labs    07/11/20 1045 09/25/20 1641 09/26/20 0350 09/27/20 0343  WBC 6.8 15.0* 12.7* 10.9*  HGB 10.2* 11.7* 9.7* 8.5*  HCT 30.5* 36.5 29.4* 25.4*  PLT 478.0* 319 240 283    COAGS: Recent Labs    09/27/20 0725  INR 1.2    BMP: Recent Labs    07/11/20 1045 09/25/20 1641 09/26/20 0350 09/27/20 0343  NA 140 132* 131* 131*  K 4.0 3.9 3.9 3.3*  CL 105 94* 100 98  CO2 30 24 20* 25  GLUCOSE 82 116* 108* 119*  BUN 10 43* 44* 10  CALCIUM 9.3 8.8* 8.1*  8.3*  CREATININE 0.79 2.54* 1.52* 0.78  GFRNONAA  --  22* 42* >60    LIVER FUNCTION TESTS: Recent Labs    07/11/20 1045 09/25/20 1641 09/26/20 0350 09/27/20 0343  BILITOT 0.3 0.8 0.8 0.7  AST 11 133* 116* 55*  ALT 10 231* 186* 131*  ALKPHOS 65 79 62 60  PROT 7.6 8.1 6.2* 5.8*  ALBUMIN 3.4* 2.6* 2.0* 1.8*    TUMOR MARKERS: No results for input(s): AFPTM, CEA, CA199, CHROMGRNA in the last 8760 hours.  Assessment and Plan: Liver lesion Patient presented to [facility] ED with abdominal pain, nausea, vomiting, hypotension.  Found to have AKI with elevated LFTs.  Imaging concerning for metastatic disease vs. Abscesses.  IR consulted for liver lesion aspiration vs. Biopsy.  Case reviewed by Dr. Vernard Gambles who approves patient for procedure.  She has been NPO today.  INR 1.2  Risks and benefits of biopsy was discussed with the patient and/or patient's family including, but not limited to bleeding,  infection, damage to adjacent structures or low yield requiring additional tests.  All of the questions were answered and there is agreement to proceed.  Consent signed and in chart.   Thank you for this interesting consult.  I greatly enjoyed meeting Nicole Browning and look forward to participating in their care.  A copy of this report was sent to the requesting provider on this date.  Electronically Signed: Docia Barrier, PA 09/27/2020, 10:44 AM   I spent a total of 40 Minutes    in face to face in clinical consultation, greater than 50% of which was counseling/coordinating care for liver lesion.

## 2020-09-27 NOTE — Progress Notes (Signed)
Initial Nutrition Assessment  DOCUMENTATION CODES:   Severe malnutrition in context of chronic illness  INTERVENTION:  -Ensure Enlive po TID, each supplement provides 350 kcal and 20 grams of protein -MVI with minerals daily  NUTRITION DIAGNOSIS:   Severe Malnutrition related to chronic illness as evidenced by severe muscle depletion, severe fat depletion.  GOAL:   Patient will meet greater than or equal to 90% of their needs  MONITOR:   PO intake, Supplement acceptance, Diet advancement, I & O's, Labs, Weight trends  REASON FOR ASSESSMENT:   Malnutrition Screening Tool    ASSESSMENT:   Pt with a PMH significant for anemia, GERD, IBS/UC, perirectal abscess presented with N/V and hypotension. Admitted with AKI with elevated liver enzymes.  Per MD, pt with multiple imaging abnormalities including liver masses, generalized peritoneal thickening, trace abdominal ascites fluid collection adherent to sigmoid serosa.  Left pelvic mass also adherent to sigmoid serosa.  Differential includes gas containing pelvic abscesses versus peritoneal metastatic disease of uncertain primary.  Fistulous communication to sigmoid colon not excluded.    Pt underwent 167m paracentesis today, sent for cytology, culture pending.   Discussed pt with RN.   Pt endorses abdominal pain and poor appetite for ~1 week, but states that she had a great appetite prior to that and has always been able to "eat whatever she wants" while maintaining a smaller frame. Pt reports abdominal pain is ongoing after her procedure today and plans to "take it easy" with nutrition in hopes of limiting pain. Pt agreeable to Ensure supplements and would like to receive three per day. Denies nausea.   PO intake: 25% x 1 recorded meal  Medications reviewed. Labs: Na 131 (L), K+ 3.3 (L), AST 55 (H), ALT 131 (H), Hgb 8.5 (L)  NUTRITION - FOCUSED PHYSICAL EXAM:  Flowsheet Row Most Recent Value  Orbital Region Severe depletion   Upper Arm Region Moderate depletion  Thoracic and Lumbar Region Moderate depletion  Buccal Region Moderate depletion  Temple Region Moderate depletion  Clavicle Bone Region Severe depletion  Clavicle and Acromion Bone Region Severe depletion  Scapular Bone Region Severe depletion  Dorsal Hand Severe depletion  Patellar Region Moderate depletion  Anterior Thigh Region Moderate depletion  Posterior Calf Region Moderate depletion  Edema (RD Assessment) None  Hair Reviewed  Eyes Reviewed  Mouth Reviewed  Skin Reviewed  Nails Unable to assess  [artificial nails]       Diet Order:   Diet Order             DIET SOFT Room service appropriate? Yes; Fluid consistency: Thin  Diet effective now                   EDUCATION NEEDS:   No education needs have been identified at this time  Skin:  Skin Assessment: Skin Integrity Issues: Skin Integrity Issues:: Other (Comment) Other: puncture L abdomen  Last BM:  7/26  Height:   Ht Readings from Last 1 Encounters:  09/26/20 5' 4"  (1.626 m)    Weight:   Wt Readings from Last 10 Encounters:  09/26/20 56.7 kg  07/24/20 59 kg  07/11/20 59.1 kg  06/22/20 59.9 kg  06/08/20 59.9 kg  04/04/20 59.9 kg  03/26/17 56.7 kg  02/24/16 59 kg  02/21/16 54.4 kg   BMI:  Body mass index is 21.46 kg/m.  Estimated Nutritional Needs:   Kcal:  1700-1900  Protein:  85-95 grams  Fluid:  >1.7L/d    ALarkin Ina MS, RD,  LDN (she/her/hers) RD pager number and weekend/on-call pager number located in [place].

## 2020-09-27 NOTE — Progress Notes (Signed)
Patient ID: Nicole Browning, female   DOB: 02-Aug-1970, 50 y.o.   MRN: [id number]  PROGRESS NOTE    Nicole Browning  EXB:[id number] DOB: 1970/09/16 DOA: 09/25/2020 PCP: Kerin Perna, NP   Brief Narrative:  50 year old female with recent diagnosis of IBD in April 2022 presented with abdominal pain, nausea and vomiting.  On presentation, she was slightly hypotensive which improved with IV fluids.  Creatinine was elevated at 2.54 along with AST of 133, ALT of 231 and total bilirubin of 0.8.  CT of the abdomen and pelvis showed features concerning for sigmoid mass with possible hepatic metastasis.  She was given a dose of IV Rocephin in the ED for possible UTI.  GI was consulted.  Assessment & Plan:   Acute renal failure -Possibly prerenal from dehydration and poor oral intake.   -Presented with creatinine of 2.54; prior creatinine was 0.79 -improving with iv fluids: 0.78 today.  Decrease IV fluids to 75 cc an hour  Possible sigmoid mass with possible hepatic metastases Elevated LFTs -LFTs improving -GI following.  MRI of abdomen and pelvis showed sigmoid mass with air-fluid level along with separate hyperintense mass in the posterior left pelvis adherent to sigmoid serosa; this could be peritoneal metastatic disease versus pelvic abscesses and fistulous communication to the sigmoid colon could not be excluded.  2 liver masses were also identified, metastatic disease versus abscesses.  IR has subsequently been consulted for possible drainage of the liver mass/abscess.  Patient was empirically started on Zosyn overnight.  Leukocytosis -Resolved  Recently diagnosed IBD -On Lialda.  Iron deficiency anemia -Hemoglobin stable.  Monitor  Hyponatremia -Possibly from poor oral intake.  Switch IV fluids to normal saline at 75 cc an hour.  Signs of sacroiliitis -Seen on CAT scan.  Hypoalbuminemia -From recent poor oral intake.  Monitor    DVT prophylaxis: SCDs. Code Status: Full Family  Communication: None at bedside Disposition Plan: Status is: Inpatient because: Inpatient level of care appropriate due to severity of illness  Dispo: The patient is from: Home              Anticipated d/c is to: Home              Patient currently is not medically stable to d/c.   Difficult to place patient No   Consultants: GI/IR  Procedures: None  Antimicrobials:  Anti-infectives (From admission, onward)    Start     Dose/Rate Route Frequency Ordered Stop   09/27/20 0145  piperacillin-tazobactam (ZOSYN) IVPB 3.375 g        3.375 g 12.5 mL/hr over 240 Minutes Intravenous Every 8 hours 09/27/20 0057     09/26/20 2200  cefTRIAXone (ROCEPHIN) 1 g in sodium chloride 0.9 % 100 mL IVPB  Status:  Discontinued        1 g 200 mL/hr over 30 Minutes Intravenous Every 24 hours 09/25/20 2331 09/26/20 0617   09/25/20 2300  cefTRIAXone (ROCEPHIN) 1 g in sodium chloride 0.9 % 100 mL IVPB        1 g 200 mL/hr over 30 Minutes Intravenous  Once 09/25/20 2245 09/25/20 2353         Subjective: Patient seen and examined at bedside.  No overnight fever or vomiting reported.  Still complains of intermittent nausea and abdominal pain. Objective: Vitals:   09/26/20 2228 09/27/20 0213 09/27/20 0605 09/27/20 0656  BP: 113/71 107/77 108/72 113/67  Pulse: (!) 108 (!) 108 (!) 111 (!) 108  Resp: 18 18 18  18  Temp: 99.6 F (37.6 C) 99.2 F (37.3 C) 98.4 F (36.9 C) 98.9 F (37.2 C)  TempSrc: Oral Oral  Oral  SpO2: 96% 97% 97% 96%  Weight:      Height:        Intake/Output Summary (Last 24 hours) at 09/27/2020 0806 Last data filed at 09/27/2020 0700 Gross per 24 hour  Intake 0 ml  Output 0 ml  Net 0 ml   Filed Weights   09/26/20 2022  Weight: 56.7 kg    Examination:  General exam: No distress.  Still on room air.   Respiratory system: Decreased breath sounds at bases bilaterally cardiovascular system: Rate controlled, S1-S2 heard  gastrointestinal system: Abdomen is distended, soft  and tender diffusely in the lower quadrant.  Bowel sounds are heard  extremities: No edema or clubbing Central nervous system: Awake and alert.  No focal neurological deficits.  Moves extremities  skin: No obvious ecchymosis/lesions Psychiatry: Flat affect    Data Reviewed: I have personally reviewed following labs and imaging studies  CBC: Recent Labs  Lab 09/25/20 1641 09/26/20 0350 09/27/20 0343  WBC 15.0* 12.7* 10.9*  NEUTROABS  --  10.6* 8.3*  HGB 11.7* 9.7* 8.5*  HCT 36.5 29.4* 25.4*  MCV 83.1 83.5 80.1  PLT 319 240 782    Basic Metabolic Panel: Recent Labs  Lab 09/25/20 1641 09/26/20 0350 09/27/20 0343  NA 132* 131* 131*  K 3.9 3.9 3.3*  CL 94* 100 98  CO2 24 20* 25  GLUCOSE 116* 108* 119*  BUN 43* 44* 10  CREATININE 2.54* 1.52* 0.78  CALCIUM 8.8* 8.1* 8.3*  MG  --   --  2.2    GFR: Estimated Creatinine Clearance: 72.6 mL/min (by C-G formula based on SCr of 0.78 mg/dL). Liver Function Tests: Recent Labs  Lab 09/25/20 1641 09/26/20 0350 09/27/20 0343  AST 133* 116* 55*  ALT 231* 186* 131*  ALKPHOS 79 62 60  BILITOT 0.8 0.8 0.7  PROT 8.1 6.2* 5.8*  ALBUMIN 2.6* 2.0* 1.8*    Recent Labs  Lab 09/25/20 1641  LIPASE 22    No results for input(s): AMMONIA in the last 168 hours. Coagulation Profile: Recent Labs  Lab 09/27/20 0725  INR 1.2   Cardiac Enzymes: No results for input(s): CKTOTAL, CKMB, CKMBINDEX, TROPONINI in the last 168 hours. BNP (last 3 results) No results for input(s): PROBNP in the last 8760 hours. HbA1C: No results for input(s): HGBA1C in the last 72 hours. CBG: No results for input(s): GLUCAP in the last 168 hours. Lipid Profile: No results for input(s): CHOL, HDL, LDLCALC, TRIG, CHOLHDL, LDLDIRECT in the last 72 hours. Thyroid Function Tests: No results for input(s): TSH, T4TOTAL, FREET4, T3FREE, THYROIDAB in the last 72 hours. Anemia Panel: No results for input(s): VITAMINB12, FOLATE, FERRITIN, TIBC, IRON,  RETICCTPCT in the last 72 hours. Sepsis Labs: Recent Labs  Lab 09/26/20 2140  PROCALCITON 15.92  LATICACIDVEN 1.1    Recent Results (from the past 240 hour(s))  Resp Panel by RT-PCR (Flu A&B, Covid) Nasopharyngeal Swab     Status: None   Collection Time: 09/25/20 10:09 PM   Specimen: Nasopharyngeal Swab; Nasopharyngeal(NP) swabs in vial transport medium  Result Value Ref Range Status   SARS Coronavirus 2 by RT PCR NEGATIVE NEGATIVE Final    Comment: (NOTE) SARS-CoV-2 target nucleic acids are NOT DETECTED.  The SARS-CoV-2 RNA is generally detectable in upper respiratory specimens during the acute phase of infection. The lowest concentration of SARS-CoV-2 viral copies  this assay can detect is 138 copies/mL. A negative result does not preclude SARS-Cov-2 infection and should not be used as the sole basis for treatment or other patient management decisions. A negative result may occur with  improper specimen collection/handling, submission of specimen other than nasopharyngeal swab, presence of viral mutation(s) within the areas targeted by this assay, and inadequate number of viral copies(<138 copies/mL). A negative result must be combined with clinical observations, patient history, and epidemiological information. The expected result is Negative.  Fact Sheet for Patients:  EntrepreneurPulse.com.au  Fact Sheet for Healthcare Providers:  IncredibleEmployment.be  This test is no t yet approved or cleared by the [place] FDA and  has been authorized for detection and/or diagnosis of SARS-CoV-2 by FDA under an Emergency Use Authorization (EUA). This EUA will remain  in effect (meaning this test can be used) for the duration of the COVID-19 declaration under Section 564(b)(1) of the Act, 21 U.S.C.section 360bbb-3(b)(1), unless the authorization is terminated  or revoked sooner.       Influenza A by PCR NEGATIVE NEGATIVE Final    Influenza B by PCR NEGATIVE NEGATIVE Final    Comment: (NOTE) The Xpert Xpress SARS-CoV-2/FLU/RSV plus assay is intended as an aid in the diagnosis of influenza from Nasopharyngeal swab specimens and should not be used as a sole basis for treatment. Nasal washings and aspirates are unacceptable for Xpert Xpress SARS-CoV-2/FLU/RSV testing.  Fact Sheet for Patients: EntrepreneurPulse.com.au  Fact Sheet for Healthcare Providers: IncredibleEmployment.be  This test is not yet approved or cleared by the [place] FDA and has been authorized for detection and/or diagnosis of SARS-CoV-2 by FDA under an Emergency Use Authorization (EUA). This EUA will remain in effect (meaning this test can be used) for the duration of the COVID-19 declaration under Section 564(b)(1) of the Act, 21 U.S.C. section 360bbb-3(b)(1), unless the authorization is terminated or revoked.  Performed at [facility], [place] [address], [place], [place] [address]           Radiology Studies: CT ABDOMEN PELVIS WO CONTRAST  Result Date: 09/25/2020 CLINICAL DATA:  Abdominal pain, acute nonlocalized abdominal pain associated with muscle spasms. EXAM: CT ABDOMEN AND PELVIS WITHOUT CONTRAST TECHNIQUE: Multidetector CT imaging of the abdomen and pelvis was performed following the standard protocol without IV contrast. COMPARISON:  February 24, 2016 FINDINGS: Lower chest: Basilar atelectasis/early consolidation on the RIGHT. No effusion. Hepatobiliary: Diffuse central low attenuation in the liver spanning medial an lateral segment of LEFT hepatic lobe along the fissure for false form ligament measuring up to 9.3 x 4.3 cm in total. This appears either anterior 2 or also involving intrahepatic inferior vena cava tracking towards the confluence of the hepatic veins. No pericholecystic stranding. No additional lesions visualized. Pancreas: Not well visualized. Grossly unremarkable and  without signs of inflammation. Spleen: Spleen normal size and contour though with serosal disease suspected along the anterior margin. See below. Adrenals/Urinary Tract: Adrenal glands are normal. Kidneys with smooth contours. No hydronephrosis. Urinary bladder is collapsed. Stomach/Bowel: Gastric thickening is suggested in the area of the gastric antrum with some perigastric stranding. Mild distension of small bowel loops in the abdomen without signs of gross obstruction. Colonic loops decompressed distal to the transverse colon. Suggestion of low-attenuation focus along the margin of the sigmoid (image 67/3) this is suspicious for serosal implant with an adjacent soft tissue nodule in the sigmoid mesentery (image 66/6) 2.3 x 1.5 cm. Mid sigmoid abnormality as discussed below. Visualized portions of the appendix  are normal. Vascular/Lymphatic: Calcified atheromatous plaque, scattered throughout the normal caliber abdominal aorta. Smooth contour of the IVC. Scattered small lymph nodes in the retroperitoneum. None with pathologic enlargement. No gross adenopathy in the abdomen though with ill-defined soft tissue in the porta hepatis that may represent conglomerate adenopathy the largest potentially 13 mm short axis (image 28/3) Scattered lymph nodes adjacent to rectosigmoid colon best seen on coronal image 76, adjacent to either soft tissue implant or large lymph node. Adjacent to the dominant soft tissue nodule adjacent to the colon there is question of colonic thickening from which this area arises. This is at the level of the mid sigmoid colon. Ovaries along the bilateral pelvic sidewalls. Reproductive: Ovaries along the bilateral pelvic sidewalls. No adnexal mass. Uterus unremarkable. Other: While there is no ascites. There is fascial thickening with nodularity, for example on image 28 of series 3 6 mm thickening of the abdominal fascia adjacent to the stomach. Nodularity tracking along the anterior and lateral  transversalis and in the lateral conal fascia in the LEFT hemiabdomen. Indistinct appearance of the omental fat raising the question of omental disease. Musculoskeletal: No acute musculoskeletal findings. Signs of sacroiliitis with asymmetric involvement of the RIGHT sacral iliac joint. No destructive bone finding or acute bone process. IMPRESSION: 1. 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 Hepatic MRI or CT with contrast may be helpful as the patient is able for further evaluation. 2. Question of gastric antral thickening.  Areas not well assessed. 3. Signs of sacroiliitis with asymmetric involvement of the RIGHT sacral iliac joint. Correlate with any clinical or laboratory evidence of seronegative spondyloarthropathy. 4. Juxta diaphragmatic airspace disease in the RIGHT chest favored to represent juxta diaphragmatic atelectasis. 5. Aortic atherosclerosis. Electronically Signed   By: Zetta Bills M.D.   On: 09/25/2020 19:04   MR PELVIS WO CONTRAST  Addendum Date: 09/26/2020   ADDENDUM REPORT: 09/26/2020 19:50 ADDENDUM: These results were called by telephone at the time of interpretation on 09/26/2020 at 7:49 pm to provider hospitalist DR. SHALHOUB, who verbally acknowledged these results. Electronically Signed   By: Ilona Sorrel M.D.   On: 09/26/2020 19:50   Result Date: 09/26/2020 CLINICAL DATA:  50 year old female with acute abdominal pain. Indeterminate perisigmoid masses and liver masses on noncontrast CT from 1 day prior. By report, no sigmoid mass identified on colonoscopy performed in May 2022. EXAM: MRI PELVIS WITHOUT CONTRAST TECHNIQUE: Multiplanar multisequence MR imaging of the pelvis was performed. No intravenous contrast was administered. Study discontinued early at the patient's request, with intravenous contrast not administered and postcontrast sequences not obtained. COMPARISON:  07/24/2020 MRI  pelvis.  09/25/2020 CT abdomen/pelvis. FINDINGS: Urinary Tract:  Normal bladder and urethra. Bowel: There is a 3.0 x 2.1 x 2.5 cm mildly T2 hyperintense mass in the posterior left pelvis adherent to the serosa of the sigmoid colon (series 5/image 4), with the suggestion of a tiny amount of nondependent internal gas. There is a separate nearby 2.2 x 1.7 x 1.3 cm T2 hyperintense mass in the sigmoid mesentery (series 5/image 1). There is a separate more anteriorly located thick walled 4.3 x 2.9 x 2.8 cm collection adherent to the sigmoid serosa with air-fluid level (series 3/image 21). There is mild wall thickening of the mid sigmoid colon. Vascular/Lymphatic: No appreciable acute vascular abnormality on this noncontrast scan. No pathologically enlarged inguinal nodes. Reproductive: Normal size anteverted uterus measuring 6.2 x 3.1 x 3.9 cm. No  uterine fibroids. Thin endometrium (2 mm thickness) with no discrete endometrial mass and no endometrial cavity fluid. No discrete mass of the uterine cervix, noting this study is not tailored for evaluation of the uterus. The ovaries appear symmetric and small (series 6/image 3 on the right and image 5 on the left). Other: Mild thickening of the bilateral pelvic peritoneum (series 7/image 13 on the right and image 14 on the left). No pelvic ascites. Musculoskeletal: No aggressive appearing focal osseous lesions. IMPRESSION: 1. Limited noncontrast MRI pelvis study, discontinued early at the patient's request. 2. Thick-walled 4.3 x 2.9 x 2.8 cm collection adherent to the proximal sigmoid serosa in the left anterior pelvis with air-fluid level. Separate 3.0 x 2.1 x 2.5 cm T2 hyperintense mass in the posterior left pelvis adherent to the sigmoid serosa with suggestion of a tiny amount of nondependent gas. Separate 2.2 cm T2 hyperintense mass in the sigmoid mesentery. These findings are indeterminate, with differential including gas-containing pelvic abscesses versus peritoneal  metastatic disease of uncertain primary. Fistulous communication to the sigmoid colon not excluded. Surgical consultation suggested. 3. Mild thickening of the bilateral pelvic peritoneum, nonspecific, differential includes peritonitis vs. peritoneal carcinomatosis. Electronically Signed: By: Ilona Sorrel M.D. On: 09/26/2020 19:35   MR ABDOMEN WO CONTRAST  Addendum Date: 09/26/2020   ADDENDUM REPORT: 09/26/2020 19:49 ADDENDUM: These results were called by telephone at the time of interpretation on 09/26/2020 at 7:46 pm to provider DR. SHALHOUB, who verbally acknowledged these results. Upon further discussion with Dr. Cyd Silence, the patient is exhibiting clinical findings of sepsis with an elevated WBC count. The differential for the liver masses includes liver abscesses in addition to metastatic disease. Additionally the differential for the peritoneal thickening includes infectious peritonitis in addition to carcinomatosis. IR consultation for consideration of percutaneous biopsy/drainage of these liver lesions is suggested. Electronically Signed   By: Ilona Sorrel M.D.   On: 09/26/2020 19:49   Result Date: 09/26/2020 CLINICAL DATA:  Crohn disease. Indeterminate liver masses on unenhanced CT from earlier today. EXAM: MRI ABDOMEN WITHOUT CONTRAST TECHNIQUE: Multiplanar multisequence MR imaging was performed without the administration of intravenous contrast. Patient unable to tolerate full exam or IV contrast. Only axial and coronal T2 weighted imaging obtained. COMPARISON:  09/25/2020 CT abdomen/pelvis. FINDINGS: Motion degraded incomplete MRI abdomen study performed without IV contrast. Lower chest: Streaky curvilinear opacities at the dependent right greater than left lung bases bilaterally, not well evaluated by MRI. Hepatobiliary: Similar heterogeneous T2 signal intensity liver masses measuring 5.6 x 4.7 cm in the caudate lobe (series 15/image 16) and 5.3 x 4.0 cm in the posterior left liver (series 15/image  19). Normal gallbladder with no cholelithiasis. Mild segmental intrahepatic biliary ductal dilatation in the lateral segment left liver lobe. No significant intrahepatic biliary ductal dilatation in the right liver. Common bile duct diameter 3 mm. No choledocholithiasis. No appreciable biliary masses or strictures. Pancreas: No pancreatic mass or duct dilation.  No pancreas divisum. Spleen: Normal size. No mass. Adrenals/Urinary Tract: Normal adrenals. No hydronephrosis. Simple appearing homogeneous T2 hyperintense left renal cysts, largest 2.3 cm in the lower left kidney. No overtly suspicious renal masses. Stomach/Bowel: Normal non-distended stomach. Visualized small and large bowel is normal caliber, with no bowel wall thickening. Vascular/Lymphatic: Nonaneurysmal abdominal aorta. No pathologically enlarged lymph nodes in the abdomen. Other: Trace abdominal ascites. Scattered mild generalized peritoneal thickening, for example in the right pericolic gutter (series 14/ERXVQ 34) and left upper quadrant (series 15/image 25). No focal drainable fluid collection. Musculoskeletal: No aggressive  appearing focal osseous lesions. IMPRESSION: 1. Limited motion degraded incomplete MRI abdomen study performed without IV contrast, discontinued prior to study completion at the patient's request. 2. Two similar indeterminate heterogeneous liver masses measuring 5.6 cm in the caudate lobe and 5.3 cm in the posterior left liver, incompletely evaluated. Metastatic disease not excluded. Consider IR consultation for potential ultrasound-guided liver biopsy. If clinically feasible, liver protocol CT abdomen and pelvis without and with IV contrast may be considered for further evaluation prior to potential biopsy. 3. Mild generalized peritoneal thickening. Trace abdominal ascites. 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 4. Streaky  curvilinear opacities at the dependent right greater than left lung bases, not well evaluated by MRI. Chest CT may be obtained for further evaluation as clinically warranted. Electronically Signed: By: Ilona Sorrel M.D. On: 09/26/2020 18:25        Scheduled Meds:  mesalamine  1.2 g Oral BID   Continuous Infusions:  piperacillin-tazobactam (ZOSYN)  IV 3.375 g (09/27/20 0801)          Aline August, MD [facility] 09/27/2020, 8:06 AM

## 2020-09-27 NOTE — Procedures (Signed)
Interventional Radiology Procedure:   Indications: Indeterminate liver lesions  Procedure: US guided paracentesis  Findings: Small amount of perihepatic ascites.  100 ml of brown opaque fluid removed. Liver lesions identified but subtle.  Complications: No immediate complications noted.     EBL: Minimal  Plan: Send fluid for cytology and culture.  Liver biopsy not performed at this time.     Nicole Browning R. Anselm Pancoast, MD  Pager: [phone]

## 2020-09-27 NOTE — Progress Notes (Signed)
[facility] notified of yellow MEWS via Chat 0.5 mg IV Morphine given 8/10 pain. Will continue to monitor. Arthor Captain LPN

## 2020-09-27 NOTE — Plan of Care (Signed)
  Problem: Education: Goal: Knowledge of General Education information will improve Description Including pain rating scale, medication(s)/side effects and non-pharmacologic comfort measures Outcome: Progressing   Problem: Health Behavior/Discharge Planning: Goal: Ability to manage health-related needs will improve Outcome: Progressing   

## 2020-09-28 DIAGNOSIS — N179 Acute kidney failure, unspecified: Secondary | ICD-10-CM | POA: Diagnosis not present

## 2020-09-28 DIAGNOSIS — R1010 Upper abdominal pain, unspecified: Secondary | ICD-10-CM | POA: Diagnosis not present

## 2020-09-28 DIAGNOSIS — K6389 Other specified diseases of intestine: Secondary | ICD-10-CM | POA: Diagnosis not present

## 2020-09-28 DIAGNOSIS — E43 Unspecified severe protein-calorie malnutrition: Secondary | ICD-10-CM | POA: Insufficient documentation

## 2020-09-28 DIAGNOSIS — R160 Hepatomegaly, not elsewhere classified: Secondary | ICD-10-CM | POA: Diagnosis not present

## 2020-09-28 LAB — CBC WITH DIFFERENTIAL/PLATELET
Abs Immature Granulocytes: 0.28 10*3/uL — ABNORMAL HIGH (ref 0.00–0.07)
Basophils Absolute: 0 10*3/uL (ref 0.0–0.1)
Basophils Relative: 0 %
Eosinophils Absolute: 0 10*3/uL (ref 0.0–0.5)
Eosinophils Relative: 0 %
HCT: 25.4 % — ABNORMAL LOW (ref 36.0–46.0)
Hemoglobin: 8.4 g/dL — ABNORMAL LOW (ref 12.0–15.0)
Immature Granulocytes: 2 %
Lymphocytes Relative: 14 %
Lymphs Abs: 1.6 10*3/uL (ref 0.7–4.0)
MCH: 27 pg (ref 26.0–34.0)
MCHC: 33.1 g/dL (ref 30.0–36.0)
MCV: 81.7 fL (ref 80.0–100.0)
Monocytes Absolute: 1.4 10*3/uL — ABNORMAL HIGH (ref 0.1–1.0)
Monocytes Relative: 13 %
Neutro Abs: 8.1 10*3/uL — ABNORMAL HIGH (ref 1.7–7.7)
Neutrophils Relative %: 71 %
Platelets: 322 10*3/uL (ref 150–400)
RBC: 3.11 MIL/uL — ABNORMAL LOW (ref 3.87–5.11)
RDW: 15.9 % — ABNORMAL HIGH (ref 11.5–15.5)
WBC: 11.5 10*3/uL — ABNORMAL HIGH (ref 4.0–10.5)
nRBC: 0 % (ref 0.0–0.2)

## 2020-09-28 LAB — COMPREHENSIVE METABOLIC PANEL
ALT: 99 U/L — ABNORMAL HIGH (ref 0–44)
AST: 41 U/L (ref 15–41)
Albumin: 1.7 g/dL — ABNORMAL LOW (ref 3.5–5.0)
Alkaline Phosphatase: 73 U/L (ref 38–126)
Anion gap: 6 (ref 5–15)
BUN: 8 mg/dL (ref 6–20)
CO2: 28 mmol/L (ref 22–32)
Calcium: 8.8 mg/dL — ABNORMAL LOW (ref 8.9–10.3)
Chloride: 101 mmol/L (ref 98–111)
Creatinine, Ser: 0.75 mg/dL (ref 0.44–1.00)
GFR, Estimated: >60 mL/min (ref >60)
Glucose, Bld: 115 mg/dL — ABNORMAL HIGH (ref 70–99)
Potassium: 4 mmol/L (ref 3.5–5.1)
Sodium: 135 mmol/L (ref 135–145)
Total Bilirubin: 0.8 mg/dL (ref 0.3–1.2)
Total Protein: 5.8 g/dL — ABNORMAL LOW (ref 6.5–8.1)

## 2020-09-28 LAB — CYTOLOGY - NON PAP

## 2020-09-28 LAB — C-REACTIVE PROTEIN: CRP: 26.6 mg/dL — ABNORMAL HIGH (ref <1.0)

## 2020-09-28 LAB — CEA: CEA: 4.5 ng/mL (ref 0.0–4.7)

## 2020-09-28 LAB — MAGNESIUM: Magnesium: 1.8 mg/dL (ref 1.7–2.4)

## 2020-09-28 LAB — CANCER ANTIGEN 19-9: CA 19-9: <2 U/mL (ref 0–35)

## 2020-09-28 LAB — CA 125: Cancer Antigen (CA) 125: 23.6 U/mL (ref 0.0–38.1)

## 2020-09-28 MED ORDER — SODIUM CHLORIDE 0.9 % IV SOLN
250.0000 mg | Freq: Every day | INTRAVENOUS | Status: AC
  Administered 2020-09-28 – 2020-09-29 (×2): 250 mg via INTRAVENOUS
  Filled 2020-09-28 (×2): qty 20

## 2020-09-28 NOTE — Progress Notes (Addendum)
Daily Rounding Note  09/28/2020, 10:54 AM  LOS: 2 days   SUBJECTIVE:   Chief complaint:  sigmoid region mass.  Abdominal pain.     Pain persists but better.  Worse post prandial so not eating a lot.  Brown stool yest and again this AM.    OBJECTIVE:         Vital signs in last 24 hours:    Temp:  [98.3 F (36.8 C)-99.9 F (37.7 C)] 98.3 F (36.8 C) (07/29 0940) Pulse Rate:  [98-110] 101 (07/29 0940) Resp:  [16-24] 18 (07/29 0940) BP: (103-129)/(61-81) 121/76 (07/29 0940) SpO2:  [95 %-100 %] 95 % (07/29 0940) Last BM Date: 09/25/20 Filed Weights   09/26/20 2022  Weight: 56.7 kg   General: NAD.  Alert.  comfortable   Heart: RRR Chest: no dyspnea, no cough Abdomen: firm but not hard, ND.  Less tender.  Active BS.  Extremities: no CCE Neuro/Psych:  oriented x 3.  No weakness or tremors.    Intake/Output from previous day: 07/28 0701 - 07/29 0700 In: 2228.6 [P.O.:797; I.V.:1253; IV Piggyback:178.6] Out: -   Intake/Output this shift: Total I/O In: 437 [P.O.:200; Other:237] Out: -   Lab Results: Recent Labs    09/26/20 0350 09/27/20 0343 09/28/20 0348  WBC 12.7* 10.9* 11.5*  HGB 9.7* 8.5* 8.4*  HCT 29.4* 25.4* 25.4*  PLT 240 283 322   BMET Recent Labs    09/26/20 0350 09/27/20 0343 09/28/20 0348  NA 131* 131* 135  K 3.9 3.3* 4.0  CL 100 98 101  CO2 20* 25 28  GLUCOSE 108* 119* 115*  BUN 44* 10 8  CREATININE 1.52* 0.78 0.75  CALCIUM 8.1* 8.3* 8.8*   LFT Recent Labs    09/26/20 0350 09/27/20 0343 09/28/20 0348  PROT 6.2* 5.8* 5.8*  ALBUMIN 2.0* 1.8* 1.7*  AST 116* 55* 41  ALT 186* 131* 99*  ALKPHOS 62 60 73  BILITOT 0.8 0.7 0.8  BILIDIR 0.3*  --   --   IBILI 0.5  --   --    PT/INR Recent Labs    09/27/20 0725  LABPROT 14.7  INR 1.2   Hepatitis Panel No results for input(s): HEPBSAG, HCVAB, HEPAIGM, HEPBIGM in the last 72 hours.  Studies/Results: CT CHEST W  CONTRAST  Result Date: 09/27/2020 CLINICAL DATA:  Cancer of unknown origin. EXAM: CT CHEST WITH CONTRAST TECHNIQUE: Multidetector CT imaging of the chest was performed during intravenous contrast administration. CONTRAST:  22m OMNIPAQUE IOHEXOL 300 MG/ML  SOLN COMPARISON:  September 26, 2020.  September 25, 2020. FINDINGS: Cardiovascular: No significant vascular findings. Normal heart size. No pericardial effusion. Mediastinum/Nodes: Thyroid gland is unremarkable. Possible 2 cm subcarinal lymph node is noted. Wall thickening of distal esophagus is noted concerning for esophagitis. Lungs/Pleura: No pneumothorax is noted. Small left pleural effusion is noted. Mild bilateral posterior basilar subsegmental atelectasis or infiltrates are noted. Upper Abdomen: As noted on prior CT, hepatic masses are noted concerning for malignancy or metastatic disease. Musculoskeletal: No chest wall abnormality. No acute or significant osseous findings. IMPRESSION: Small left pleural effusion is noted. Mild bilateral posterior basilar subsegmental atelectasis or infiltrates are noted. Possible 2 cm subcarinal lymph node is noted which may represent metastatic disease. Wall thickening of distal esophagus is noted concerning for esophagitis. Endoscopy is recommended for further evaluation. Hepatic masses are again noted as described on prior CT scan, concerning for malignancy or metastatic disease. Electronically Signed  By: Marijo Conception M.D.   On: 09/27/2020 20:01   MR PELVIS WO CONTRAST  Addendum Date: 09/26/2020   ADDENDUM REPORT: 09/26/2020 19:50 ADDENDUM: These results were called by telephone at the time of interpretation on 09/26/2020 at 7:49 pm to provider hospitalist DR. SHALHOUB, who verbally acknowledged these results. Electronically Signed   By: Ilona Sorrel M.D.   On: 09/26/2020 19:50   Result Date: 09/26/2020 CLINICAL DATA:  50 year old female with acute abdominal pain. Indeterminate perisigmoid masses and liver masses on  noncontrast CT from 1 day prior. By report, no sigmoid mass identified on colonoscopy performed in May 2022. EXAM: MRI PELVIS WITHOUT CONTRAST TECHNIQUE: Multiplanar multisequence MR imaging of the pelvis was performed. No intravenous contrast was administered. Study discontinued early at the patient's request, with intravenous contrast not administered and postcontrast sequences not obtained. COMPARISON:  07/24/2020 MRI pelvis.  09/25/2020 CT abdomen/pelvis. FINDINGS: Urinary Tract:  Normal bladder and urethra. Bowel: There is a 3.0 x 2.1 x 2.5 cm mildly T2 hyperintense mass in the posterior left pelvis adherent to the serosa of the sigmoid colon (series 5/image 4), with the suggestion of a tiny amount of nondependent internal gas. There is a separate nearby 2.2 x 1.7 x 1.3 cm T2 hyperintense mass in the sigmoid mesentery (series 5/image 1). There is a separate more anteriorly located thick walled 4.3 x 2.9 x 2.8 cm collection adherent to the sigmoid serosa with air-fluid level (series 3/image 21). There is mild wall thickening of the mid sigmoid colon. Vascular/Lymphatic: No appreciable acute vascular abnormality on this noncontrast scan. No pathologically enlarged inguinal nodes. Reproductive: Normal size anteverted uterus measuring 6.2 x 3.1 x 3.9 cm. No uterine fibroids. Thin endometrium (2 mm thickness) with no discrete endometrial mass and no endometrial cavity fluid. No discrete mass of the uterine cervix, noting this study is not tailored for evaluation of the uterus. The ovaries appear symmetric and small (series 6/image 3 on the right and image 5 on the left). Other: Mild thickening of the bilateral pelvic peritoneum (series 7/image 13 on the right and image 14 on the left). No pelvic ascites. Musculoskeletal: No aggressive appearing focal osseous lesions. IMPRESSION: 1. Limited noncontrast MRI pelvis study, discontinued early at the patient's request. 2. Thick-walled 4.3 x 2.9 x 2.8 cm collection  adherent to the proximal sigmoid serosa in the left anterior pelvis with air-fluid level. Separate 3.0 x 2.1 x 2.5 cm T2 hyperintense mass in the posterior left pelvis adherent to the sigmoid serosa with suggestion of a tiny amount of nondependent gas. Separate 2.2 cm T2 hyperintense mass in the sigmoid mesentery. These findings are indeterminate, with differential including gas-containing pelvic abscesses versus peritoneal metastatic disease of uncertain primary. Fistulous communication to the sigmoid colon not excluded. Surgical consultation suggested. 3. Mild thickening of the bilateral pelvic peritoneum, nonspecific, differential includes peritonitis vs. peritoneal carcinomatosis. Electronically Signed: By: Ilona Sorrel M.D. On: 09/26/2020 19:35   MR ABDOMEN WO CONTRAST  Addendum Date: 09/26/2020   ADDENDUM REPORT: 09/26/2020 19:49 ADDENDUM: These results were called by telephone at the time of interpretation on 09/26/2020 at 7:46 pm to provider DR. SHALHOUB, who verbally acknowledged these results. Upon further discussion with Dr. Cyd Silence, the patient is exhibiting clinical findings of sepsis with an elevated WBC count. The differential for the liver masses includes liver abscesses in addition to metastatic disease. Additionally the differential for the peritoneal thickening includes infectious peritonitis in addition to carcinomatosis. IR consultation for consideration of percutaneous biopsy/drainage of these liver  lesions is suggested. Electronically Signed   By: Ilona Sorrel M.D.   On: 09/26/2020 19:49   Result Date: 09/26/2020 CLINICAL DATA:  Crohn disease. Indeterminate liver masses on unenhanced CT from earlier today. EXAM: MRI ABDOMEN WITHOUT CONTRAST TECHNIQUE: Multiplanar multisequence MR imaging was performed without the administration of intravenous contrast. Patient unable to tolerate full exam or IV contrast. Only axial and coronal T2 weighted imaging obtained. COMPARISON:  09/25/2020 CT  abdomen/pelvis. FINDINGS: Motion degraded incomplete MRI abdomen study performed without IV contrast. Lower chest: Streaky curvilinear opacities at the dependent right greater than left lung bases bilaterally, not well evaluated by MRI. Hepatobiliary: Similar heterogeneous T2 signal intensity liver masses measuring 5.6 x 4.7 cm in the caudate lobe (series 15/image 16) and 5.3 x 4.0 cm in the posterior left liver (series 15/image 19). Normal gallbladder with no cholelithiasis. Mild segmental intrahepatic biliary ductal dilatation in the lateral segment left liver lobe. No significant intrahepatic biliary ductal dilatation in the right liver. Common bile duct diameter 3 mm. No choledocholithiasis. No appreciable biliary masses or strictures. Pancreas: No pancreatic mass or duct dilation.  No pancreas divisum. Spleen: Normal size. No mass. Adrenals/Urinary Tract: Normal adrenals. No hydronephrosis. Simple appearing homogeneous T2 hyperintense left renal cysts, largest 2.3 cm in the lower left kidney. No overtly suspicious renal masses. Stomach/Bowel: Normal non-distended stomach. Visualized small and large bowel is normal caliber, with no bowel wall thickening. Vascular/Lymphatic: Nonaneurysmal abdominal aorta. No pathologically enlarged lymph nodes in the abdomen. Other: Trace abdominal ascites. Scattered mild generalized peritoneal thickening, for example in the right pericolic gutter (series 71/GGYIR 34) and left upper quadrant (series 15/image 25). No focal drainable fluid collection. Musculoskeletal: No aggressive appearing focal osseous lesions. IMPRESSION: 1. Limited motion degraded incomplete MRI abdomen study performed without IV contrast, discontinued prior to study completion at the patient's request. 2. Two similar indeterminate heterogeneous liver masses measuring 5.6 cm in the caudate lobe and 5.3 cm in the posterior left liver, incompletely evaluated. Metastatic disease not excluded. Consider IR  consultation for potential ultrasound-guided liver biopsy. If clinically feasible, liver protocol CT abdomen and pelvis without and with IV contrast may be considered for further evaluation prior to potential biopsy. 3. Mild generalized peritoneal thickening. Trace abdominal ascites. 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 4. Streaky curvilinear opacities at the dependent right greater than left lung bases, not well evaluated by MRI. Chest CT may be obtained for further evaluation as clinically warranted. Electronically Signed: By: Ilona Sorrel M.D. On: 09/26/2020 18:25   US Paracentesis  Result Date: 09/27/2020 INDICATION: 50 year old with indeterminate liver lesions and scheduled for ultrasound-guided liver aspiration or biopsy. EXAM: ULTRASOUND-GUIDED PARACENTESIS COMPARISON:  None. MEDICATIONS: Versed 1.5 mg, fentanyl 50 mcg. Patient was monitored by radiology nurse throughout the procedure. Sedation time: 19 minutes COMPLICATIONS: None immediate. TECHNIQUE: The procedure was explained to the patient. The risks and benefits of the procedure were discussed and the patient's questions were addressed. Informed consent was obtained from the patient. The abdomen was evaluated with ultrasound and perihepatic ascites was identified. Consent was also obtained for a paracentesis. Decision was made to perform paracentesis prior to biopsy. The anterior abdomen was prepped with chlorhexidine and sterile field was created. Skin was anesthetized using 1% lidocaine. A 19 gauge Yueh catheter was directed into the left upper abdominal fluid collection using ultrasound guidance. Opaque brown fluid was aspirated. Approximately 100 mL of fluid was removed. Based  on the appearance of the fluid, liver biopsy was not performed at this time. Fluid was sent for culture and cytology. Bandage placed over the puncture site. FINDINGS:  Small amount of perihepatic ascites along the left side of the abdomen. 100 mL of opaque brown fluid was removed. The lesions in the caudate and posterior left hepatic lobe were identified. The caudate lesion is more conspicuous. The posterior left hepatic lobe lesion is a subtle heterogeneous lesion. IMPRESSION: Successful ultrasound-guided paracentesis yielding 100 mL brown fluid from the left upper abdomen. Ultrasound-guided liver biopsy was not performed at this time. Biopsy to the posterior left hepatic lobe appears to be feasible if needed in the future. Electronically Signed   By: Markus Daft M.D.   On: 09/27/2020 15:35    Scheduled Meds:  feeding supplement  237 mL Oral TID BM   mesalamine  1.2 g Oral BID   multivitamin with minerals  1 tablet Oral Daily   Continuous Infusions:  0.9 % NaCl with KCl 40 mEq / L 75 mL/hr at 09/28/20 0303   piperacillin-tazobactam (ZOSYN)  IV 3.375 g (09/28/20 0534)   PRN Meds:.morphine injection, ondansetron **OR** ondansetron (ZOFRAN) IV  ASSESMENT:   Abdominal pain.  Multiple imaging abnormalities including liver masses, generalized peritoneal thickening, trace abdominal ascites fluid collection adherent to sigmoid serosa.  Left pelvic mass also adherent to sigmoid serosa.  Differential includes gas containing pelvic abscesses versus peritoneal metastatic disease of uncertain primary.  Fistulous communication to sigmoid colon not excluded.  R/O metastatic dz vs infectious /abscesses.  CEA, CA125, CA 19-9 all within normal limits.  + leukocytosis, Day 3 empiric Zosyn.  LFTs improved.  CT chest w subcarinal lymph node, "may represent metastatic dz", distal esoph wall thickening.  Liver masses still concerning for malignancy or mets.      S/p diagnostic paracentesis 7/28.   Prelim fluid studies w abundant WBCs, primarily PMNs, no organisms, no growth at less than 24 hours.  Fluid cytology in progress.     06/22/2020 colonoscopy with inflammatory polyp resection,  mild pancolitis and ileitis, probably Crohn's, started on Lialda.  Multiple drainage procedures over the last few years for vulvar and perirectal abscesses.   Hyponatremia.  Hypokalemia. Hypoalbuminemia.        IDA. Low iron, borderline low ferritin in May 2022.  Did not tolerate oral iron and only took this for a couple of weeks in May. Hgb 11.7 >> 8.5 over the last 3 days.    PLAN     Ferrelicit x 2 doses.  Check pre-albumin in AM.      ? Pursue EGD?    Await ascitic fluid cytology.      Azucena Freed  09/28/2020, 10:54 AM Phone [phone]     Attending physician's note   I have taken an interval history, reviewed the chart and examined the patient. I agree with the Advanced Practitioner's note, impression and recommendations.   ?  Peritoneal carcinomatosis with liver lesions vs inflammatory processes. Neg fluid cytology and culture from paracenteses 7/28. CT chest showing 2 cm subcarinal lymph node which may represent metastatic disease.  Wall thickening of the distal esophagus and recommend EGD.  There was gastric wall thickening noted as well.  Neg colon except for IBD, Nl CEA, CA 19-9, CA125  Plan: -EGD in a.m. -Surgical consultation as suggested by radiology (MRI pelvis)-nonurgent, for tomorrow. -Continue IV Zosyn for now. -Reeval for IR guided liver biopsy (over the weekend or early next week).  Carmell Austria, MD [facility] [phone]

## 2020-09-28 NOTE — Plan of Care (Signed)
  Problem: Health Behavior/Discharge Planning: Goal: Ability to manage health-related needs will improve Outcome: Progressing   Problem: Clinical Measurements: Goal: Will remain free from infection Outcome: Progressing Goal: Diagnostic test results will improve Outcome: Progressing   Problem: Nutrition: Goal: Adequate nutrition will be maintained Outcome: Progressing   Problem: Pain Managment: Goal: General experience of comfort will improve Outcome: Progressing

## 2020-09-28 NOTE — Consult Note (Signed)
Consult Note  Nicole Browning Sep 23, 1970  [id number].    Requesting MD: Dr. Jackquline Denmark Chief Complaint/Reason for Consult:Left Pelvic mass with abdominal pain  HPI:  Patient is a 50 year old female who presented to [facility] with abdominal pain, post prandial since last Saturday.  She has had some mild nausea, but no vomiting. She admits to symptoms that feel like dysphagia, but never admits to actually being unable to swallow or things coming up.  Pain described as periumbilical and sharp, and is intermittent. She is also having diarrhea, BMs were initially formed and then became loose. No bloody or melenic stools recently, but does have a history of some dark red blood in her stool when she would strain. Patient denied fever/chills. Associated weight loss over the last several days, but none prior to that. Patient was tachycardic and hypotensive at urgent care and sent to the ED here for admission.   Past GI hx: Screening colonoscopy 06/22/20 chronic mild-moderate active colitis and small bowel biopsies were benign without inflammation. Ascending colonic polyp was resected and path was inflammatory. She was started in [place] for presumed crohn's disease from colonoscopy findings. 06/26/20 underwent I&D of left perianal abscess and prescribed 1 week abx. 07/10/20 underwent I&D of left perianal abscess again and prescribed 1 week abx. 07/11/20 stool cultures were negative. 07/24/20 MR pelvis with out fistula or perianal abscess, small free fluid in pelvic cul-de-sac, left ovarian hemorrhagic cyst, left labial sebaceous cyst. 09/25/20 CT AP without contrast showed Low-attenuation lesion at margin of sigmoid suspicious for serosal implant with adjacent 2.3  x 0.5 cm soft tissue sigmoid mesenteric nodule.  Scattered rectosigmoid adenopathy most prominent is adjacent to the soft tissue nodule.  Suggestion of gastric wall thickening.  Distended small bowel loops without obstruction.  Nodularity and fascial  thickening of abdominal fascia adjacent to stomach which tracks anteriorly to the lateral transversalis, lateral colon.  Normal fascia and left hemiabdomen.  Indistinct omental fat concerning for omental disease.  Area of low attenuation in the left hepatic lobe measuring up to 9.3 x 4.3 cm tracks towards confluence of hepatic veins.  No GB thickening.  Right sacroileitis.  Ovaries and uterus unremarkable.  Right chest juxta diaphragmatic airspace disease, likely atelectasis.  Aortic atherosclerosis.  She has since being admitted had a paracentesis with cultures and cytology pending.  She is on zosyn for possible infection in her abdomen.  Based on her scans there is concern for diffuse malignancy vs infection.  We have been asked to see for further evaluation and recommendations.  ROS: Review of Systems  Constitutional:  Positive for weight loss. Negative for chills and fever.  HENT: Negative.    Eyes: Negative.   Respiratory:  Negative for cough and wheezing.   Cardiovascular:  Negative for chest pain and leg swelling.  Gastrointestinal:  Positive for abdominal pain, blood in stool, diarrhea and nausea. Negative for constipation, melena and vomiting.  Genitourinary:  Negative for dysuria.       Denies any vaginal discharge  Skin: Negative.   Neurological: Negative.   Endo/Heme/Allergies: Negative.   Psychiatric/Behavioral: Negative.     Family History  Problem Relation Age of Onset   Diabetes Mother    COPD Father    Leukemia Brother    Colon cancer Neg Hx    Colon polyps Neg Hx    Esophageal cancer Neg Hx    Rectal cancer Neg Hx    Stomach cancer Neg Hx  Past Medical History:  Diagnosis Date   Anemia    GERD (gastroesophageal reflux disease)    IBS (irritable bowel syndrome)    Perirectal abscess    Protein-calorie malnutrition ([place]) 02/29/2016   Tobacco use    UC (ulcerative colitis) ([place])     Past Surgical History:  Procedure Laterality Date   IRRIGATION AND  DEBRIDEMENT ABSCESS N/A 02/24/2016   Procedure: IRRIGATION AND DEBRIDEMENT ABSCESS;  Surgeon: Stark Klein, MD;  Location: [place] OR;  Service: General;  Laterality: N/A;    Social History:  reports that she has been smoking cigarettes. She has a 9.24 pack-year smoking history. She has never used smokeless tobacco. She reports current alcohol use of about 2.0 standard drinks of alcohol per week. She reports that she does not use drugs.  Allergies:  Allergies  Allergen Reactions   Bactrim [Sulfamethoxazole-Trimethoprim] Hives    Immediate body wide hives   Percocet [Oxycodone-Acetaminophen] Nausea Only   Valium [Diazepam] Nausea Only    Medications Prior to Admission  Medication Sig Dispense Refill   aspirin EC 325 MG tablet Take 325 mg by mouth every 6 (six) hours as needed for moderate pain.     bismuth subsalicylate (PEPTO BISMOL) 262 MG/15ML suspension Take 30 mLs by mouth every 6 (six) hours as needed for indigestion.     ibuprofen (ADVIL) 200 MG tablet Take 200 mg by mouth every 6 (six) hours as needed for moderate pain.     mesalamine (LIALDA) 1.2 g EC tablet Take 1 tablet (1.2 g total) by mouth in the morning and at bedtime. 60 tablet 1   Multiple Vitamins-Minerals (WOMENS MULTIVITAMIN PO) Take 1 tablet by mouth daily.     naproxen sodium (ALEVE) 220 MG tablet Take 220 mg by mouth daily as needed (pain).     POTASSIUM PO Take 1 tablet by mouth daily.     Vitamin D, Ergocalciferol, (DRISDOL) 1.25 MG (50000 UNIT) CAPS capsule Take 1 capsule (50,000 Units total) by mouth every 7 (seven) days. TAKE ONE CAPSULE BY MOUTH ONCE WEEKLY 12 capsule 0   VITAMIN E PO Take 1 tablet by mouth daily.     doxycycline (VIBRAMYCIN) 100 MG capsule Take 1 capsule (100 mg total) by mouth 2 (two) times daily. (Patient not taking: Reported on 09/25/2020) 20 capsule 0    Blood pressure 121/76, pulse (!) 101, temperature 98.3 F (36.8 C), resp. rate 18, height 5' 4"  (1.626 m), weight 56.7 kg, SpO2 95  %. Physical Exam:  General: pleasant, WD black female who is laying in bed in NAD HEENT: head is normocephalic, atraumatic.  Sclera are noninjected.  PERRL.  Ears and nose without any masses or lesions.  Mouth is pink and moist Heart: regular, rate, and rhythm.  Normal s1,s2. No obvious murmurs, gallops, or rubs noted.  Palpable radial and pedal pulses bilaterally Lungs: CTAB, no wheezes, rhonchi, or rales noted.  Respiratory effort nonlabored Abd: soft, centralized abdomen tenderness with some voluntary guarding, but no peritonitis, + distention likely secondary to fluid, +BS, no masses, hernias, or organomegaly MS: all 4 extremities are symmetrical with no cyanosis, clubbing, or edema. Skin: warm and dry with no masses, lesions, or rashes Neuro: Cranial nerves 2-12 grossly intact, sensation is normal throughout Psych: A&Ox3 with an appropriate affect.   Results for orders placed or performed during the hospital encounter of 09/25/20 (from the past 48 hour(s))  Culture, blood (routine x 2)     Status: None (Preliminary result)   Collection Time: 09/26/20  9:40 PM   Specimen: BLOOD  Result Value Ref Range   Specimen Description BLOOD SITE NOT SPECIFIED    Special Requests      BOTTLES DRAWN AEROBIC AND ANAEROBIC Blood Culture adequate volume   Culture      NO GROWTH < 24 HOURS Performed at [facility], [place] [address] [place], [place] [address]    Report Status PENDING   Procalcitonin - Baseline     Status: None   Collection Time: 09/26/20  9:40 PM  Result Value Ref Range   Procalcitonin 15.92 ng/mL    Comment:        Interpretation: PCT >= 10 ng/mL: Important systemic inflammatory response, almost exclusively due to severe bacterial sepsis or septic shock. (NOTE)       Sepsis PCT Algorithm           Lower Respiratory Tract                                      Infection PCT Algorithm    ----------------------------     ----------------------------         PCT < 0.25  ng/mL                PCT < 0.10 ng/mL          Strongly encourage             Strongly discourage   discontinuation of antibiotics    initiation of antibiotics    ----------------------------     -----------------------------       PCT 0.25 - 0.50 ng/mL            PCT 0.10 - 0.25 ng/mL               OR       >80% decrease in PCT            Discourage initiation of                                            antibiotics      Encourage discontinuation           of antibiotics    ----------------------------     -----------------------------         PCT >= 0.50 ng/mL              PCT 0.26 - 0.50 ng/mL                AND       <80% decrease in PCT             Encourage initiation of                                             antibiotics       Encourage continuation           of antibiotics    ----------------------------     -----------------------------        PCT >= 0.50 ng/mL                  PCT > 0.50 ng/mL  AND         increase in PCT                  Strongly encourage                                      initiation of antibiotics    Strongly encourage escalation           of antibiotics                                     -----------------------------                                           PCT <= 0.25 ng/mL                                                 OR                                        > 80% decrease in PCT                                      Discontinue / Do not initiate                                             antibiotics  Performed at [facility], [address], [place], [place] [address]   C-reactive protein     Status: Abnormal   Collection Time: 09/26/20  9:40 PM  Result Value Ref Range   CRP 38.1 (H) <1.0 mg/dL    Comment: Performed at [facility] [address], [place], [place] [address]  Lactic acid, plasma     Status: None   Collection Time: 09/26/20  9:40 PM  Result Value Ref Range   Lactic Acid, Venous 1.1 0.5  - 1.9 mmol/L    Comment: Performed at [facility] [address] [place], [place] [address]  Culture, blood (routine x 2)     Status: None (Preliminary result)   Collection Time: 09/26/20  9:46 PM   Specimen: BLOOD  Result Value Ref Range   Specimen Description BLOOD LEFT ANTECUBITAL    Special Requests      BOTTLES DRAWN AEROBIC AND ANAEROBIC Blood Culture adequate volume   Culture      NO GROWTH < 24 HOURS Performed at [facility], [place] [address] [place], [place] [address]    Report Status PENDING   CBC with Differential/Platelet     Status: Abnormal   Collection Time: 09/27/20  3:43 AM  Result Value Ref Range   WBC 10.9 (H) 4.0 - 10.5 K/uL   RBC 3.17 (L) 3.87 - 5.11 MIL/uL   Hemoglobin 8.5 (L)  12.0 - 15.0 g/dL   HCT 25.4 (L) 36.0 - 46.0 %   MCV 80.1 80.0 - 100.0 fL   MCH 26.8 26.0 - 34.0 pg   MCHC 33.5 30.0 - 36.0 g/dL   RDW 15.3 11.5 - 15.5 %   Platelets 283 150 - 400 K/uL   nRBC 0.0 0.0 - 0.2 %   Neutrophils Relative % 76 %   Neutro Abs 8.3 (H) 1.7 - 7.7 K/uL   Lymphocytes Relative 12 %   Lymphs Abs 1.3 0.7 - 4.0 K/uL   Monocytes Relative 9 %   Monocytes Absolute 1.0 0.1 - 1.0 K/uL   Eosinophils Relative 0 %   Eosinophils Absolute 0.0 0.0 - 0.5 K/uL   Basophils Relative 0 %   Basophils Absolute 0.0 0.0 - 0.1 K/uL   WBC Morphology MORPHOLOGY UNREMARKABLE    RBC Morphology MORPHOLOGY UNREMARKABLE    Smear Review MORPHOLOGY UNREMARKABLE    Immature Granulocytes 3 %   Abs Immature Granulocytes 0.27 (H) 0.00 - 0.07 K/uL    Comment: Performed at [facility], [address] [place], [place] [address]  Comprehensive metabolic panel     Status: Abnormal   Collection Time: 09/27/20  3:43 AM  Result Value Ref Range   Sodium 131 (L) 135 - 145 mmol/L   Potassium 3.3 (L) 3.5 - 5.1 mmol/L   Chloride 98 98 - 111 mmol/L   CO2 25 22 - 32 mmol/L   Glucose, Bld 119 (H) 70 - 99 mg/dL    Comment: Glucose reference range applies only to samples taken after  fasting for at least 8 hours.   BUN 10 6 - 20 mg/dL   Creatinine, Ser 0.78 0.44 - 1.00 mg/dL   Calcium 8.3 (L) 8.9 - 10.3 mg/dL   Total Protein 5.8 (L) 6.5 - 8.1 g/dL   Albumin 1.8 (L) 3.5 - 5.0 g/dL   AST 55 (H) 15 - 41 U/L   ALT 131 (H) 0 - 44 U/L   Alkaline Phosphatase 60 38 - 126 U/L   Total Bilirubin 0.7 0.3 - 1.2 mg/dL   GFR, Estimated >60 >60 mL/min    Comment: (NOTE) Calculated using the CKD-EPI Creatinine Equation (2021)    Anion gap 8 5 - 15    Comment: Performed at [facility], [place] [address] [place], [place] [address]  Magnesium     Status: None   Collection Time: 09/27/20  3:43 AM  Result Value Ref Range   Magnesium 2.2 1.7 - 2.4 mg/dL    Comment: Performed at [facility] [address], [place], [place] [address]  Protime-INR     Status: None   Collection Time: 09/27/20  7:25 AM  Result Value Ref Range   Prothrombin Time 14.7 11.4 - 15.2 seconds   INR 1.2 0.8 - 1.2    Comment: (NOTE) INR goal varies based on device and disease states. Performed at [facility], [place] [address], [place], [place] [address]   Body fluid culture w Gram Stain     Status: None (Preliminary result)   Collection Time: 09/27/20 11:30 AM   Specimen: Peritoneal Washings; Peritoneal Fluid  Result Value Ref Range   Specimen Description PERITONEAL FLUID    Special Requests NONE    Gram Stain      ABUNDANT WBC PRESENT, PREDOMINANTLY PMN NO ORGANISMS SEEN    Culture      NO GROWTH < 24 HOURS Performed at [facility], [address] [place], [place] [address]    Report Status PENDING   Cytology - Non PAP; perihepatic fluid     Status: None   Collection Time: 09/27/20 11:32 AM  Result Value Ref Range   CYTOLOGY - NON GYN      CYTOLOGY - NON PAP CASE: [id number] PATIENT: Raenette Rover Non-Gynecological Cytology Report     Clinical History: Liver lesions Specimen Submitted:  A. ASCITES, PARACENTESIS:   FINAL MICROSCOPIC DIAGNOSIS: - No  malignant cells identified  SPECIMEN ADEQUACY: Satisfactory for evaluation  DIAGNOSTIC COMMENTS: Severe inflammation is present.  GROSS: Received is/are 60cc's of cloudy, orange fluid.(TB:tb) Smears: 0 Concentration Method (Thin Prep):1 Cell Block: 1 Conventional Additional Studies: NA     Final Diagnosis performed by Thressa Sheller, MD.   Electronically signed 09/28/2020 Technical and / or Professional components performed at [facility]. [facility], [place] [address], [place], [place] [address].  Immunohistochemistry Technical component (if applicable) was performed at [facility]. [address] [place], [place], [place] [address].   IMMUNOHISTOCHEMISTRY DISCLAIMER (if applicable): Some of these immunohistochemical  stains may have been developed and the performance characteristics determine by [facility]. Some may not have been cleared or approved by the U.S. Food and Drug Administration. The FDA has determined that such clearance or approval is not necessary. This test is used for clinical purposes. It should not be regarded as investigational or for research. This laboratory is certified under the [place] (CLIA-88) as qualified to perform high complexity clinical laboratory testing.  The controls stained appropriately.   CEA     Status: None   Collection Time: 09/27/20  1:11 PM  Result Value Ref Range   CEA 4.5 0.0 - 4.7 ng/mL    Comment: (NOTE)                             Nonsmokers          <3.9                             Smokers             <5.6 [employer] Electrochemiluminescence Immunoassay (ECLIA) Values obtained with different assay methods or kits cannot be used interchangeably.  Results cannot be interpreted as absolute evidence of the presence or absence of malignant disease. Performed At: [facility] [place], [place] [id number] Rush Farmer  MD QQ:[id number]   Cancer antigen 19-9     Status: None   Collection Time: 09/27/20  1:11 PM  Result Value Ref Range   CA 19-9 <2 0 - 35 U/mL    Comment: (NOTE) [employer] Electrochemiluminescence Immunoassay (ECLIA) Values obtained with different assay methods or kits cannot be used interchangeably.  Results cannot be interpreted as absolute evidence of the presence or absence of malignant disease. Performed At: [facility] [place], [place] [id number] Rush Farmer MD [id number]   CA 125     Status: None   Collection Time: 09/27/20  1:11 PM  Result Value Ref Range   Cancer Antigen (CA) 125 23.6 0.0 - 38.1 U/mL    Comment: (NOTE) [employer] Electrochemiluminescence Immunoassay (ECLIA) Values obtained with different assay methods or kits cannot be used interchangeably.  Results cannot be interpreted as absolute evidence of the presence or absence of malignant disease. Performed  At: [facility] [place], [place] [id number] Rush Farmer MD [id number]:[id number]   CBC with Differential/Platelet     Status: Abnormal   Collection Time: 09/28/20  3:48 AM  Result Value Ref Range   WBC 11.5 (H) 4.0 - 10.5 K/uL   RBC 3.11 (L) 3.87 - 5.11 MIL/uL   Hemoglobin 8.4 (L) 12.0 - 15.0 g/dL   HCT 25.4 (L) 36.0 - 46.0 %   MCV 81.7 80.0 - 100.0 fL   MCH 27.0 26.0 - 34.0 pg   MCHC 33.1 30.0 - 36.0 g/dL   RDW 15.9 (H) 11.5 - 15.5 %   Platelets 322 150 - 400 K/uL   nRBC 0.0 0.0 - 0.2 %   Neutrophils Relative % 71 %   Neutro Abs 8.1 (H) 1.7 - 7.7 K/uL   Lymphocytes Relative 14 %   Lymphs Abs 1.6 0.7 - 4.0 K/uL   Monocytes Relative 13 %   Monocytes Absolute 1.4 (H) 0.1 - 1.0 K/uL   Eosinophils Relative 0 %   Eosinophils Absolute 0.0 0.0 - 0.5 K/uL   Basophils Relative 0 %   Basophils Absolute 0.0 0.0 - 0.1 K/uL   WBC Morphology MORPHOLOGY UNREMARKABLE    RBC Morphology MORPHOLOGY UNREMARKABLE    Smear Review MORPHOLOGY UNREMARKABLE     Immature Granulocytes 2 %   Abs Immature Granulocytes 0.28 (H) 0.00 - 0.07 K/uL    Comment: Performed at [facility], [address], [place], [place] [address]  Comprehensive metabolic panel     Status: Abnormal   Collection Time: 09/28/20  3:48 AM  Result Value Ref Range   Sodium 135 135 - 145 mmol/L   Potassium 4.0 3.5 - 5.1 mmol/L   Chloride 101 98 - 111 mmol/L   CO2 28 22 - 32 mmol/L   Glucose, Bld 115 (H) 70 - 99 mg/dL    Comment: Glucose reference range applies only to samples taken after fasting for at least 8 hours.   BUN 8 6 - 20 mg/dL   Creatinine, Ser 0.75 0.44 - 1.00 mg/dL   Calcium 8.8 (L) 8.9 - 10.3 mg/dL   Total Protein 5.8 (L) 6.5 - 8.1 g/dL   Albumin 1.7 (L) 3.5 - 5.0 g/dL   AST 41 15 - 41 U/L   ALT 99 (H) 0 - 44 U/L   Alkaline Phosphatase 73 38 - 126 U/L   Total Bilirubin 0.8 0.3 - 1.2 mg/dL   GFR, Estimated >60 >60 mL/min    Comment: (NOTE) Calculated using the CKD-EPI Creatinine Equation (2021)    Anion gap 6 5 - 15    Comment: Performed at [facility], [place] [address], [place], [place] [address]  Magnesium     Status: None   Collection Time: 09/28/20  3:48 AM  Result Value Ref Range   Magnesium 1.8 1.7 - 2.4 mg/dL    Comment: Performed at [facility] [address], [place], [place] [address]  C-reactive protein     Status: Abnormal   Collection Time: 09/28/20  3:48 AM  Result Value Ref Range   CRP 26.6 (H) <1.0 mg/dL    Comment: Performed at [facility] [address], [place], [place] [address]   CT CHEST W CONTRAST  Result Date: 09/27/2020 CLINICAL DATA:  Cancer of unknown origin. EXAM: CT CHEST WITH CONTRAST TECHNIQUE: Multidetector CT imaging of the chest was performed during intravenous contrast administration. CONTRAST:  86m OMNIPAQUE IOHEXOL 300 MG/ML  SOLN COMPARISON:  September 26, 2020.  September 25, 2020. FINDINGS: Cardiovascular: No significant vascular findings. Normal heart size. No pericardial effusion.  Mediastinum/Nodes: Thyroid gland is unremarkable. Possible 2 cm subcarinal lymph node is noted. Wall thickening of distal esophagus is noted concerning for esophagitis. Lungs/Pleura: No pneumothorax is noted. Small left pleural effusion is noted. Mild bilateral posterior basilar subsegmental atelectasis or infiltrates are noted. Upper Abdomen: As noted on prior CT, hepatic masses are noted concerning for malignancy or metastatic disease. Musculoskeletal: No chest wall abnormality. No acute or significant osseous findings. IMPRESSION: Small left pleural effusion is noted. Mild bilateral posterior basilar subsegmental atelectasis or infiltrates are noted. Possible 2 cm subcarinal lymph node is noted which may represent metastatic disease. Wall thickening of distal esophagus is noted concerning for esophagitis. Endoscopy is recommended for further evaluation. Hepatic masses are again noted as described on prior CT scan, concerning for malignancy or metastatic disease. Electronically Signed   By: Marijo Conception M.D.   On: 09/27/2020 20:01   MR PELVIS WO CONTRAST  Addendum Date: 09/26/2020   ADDENDUM REPORT: 09/26/2020 19:50 ADDENDUM: These results were called by telephone at the time of interpretation on 09/26/2020 at 7:49 pm to provider hospitalist DR. SHALHOUB, who verbally acknowledged these results. Electronically Signed   By: Ilona Sorrel M.D.   On: 09/26/2020 19:50   Result Date: 09/26/2020 CLINICAL DATA:  50 year old female with acute abdominal pain. Indeterminate perisigmoid masses and liver masses on noncontrast CT from 1 day prior. By report, no sigmoid mass identified on colonoscopy performed in May 2022. EXAM: MRI PELVIS WITHOUT CONTRAST TECHNIQUE: Multiplanar multisequence MR imaging of the pelvis was performed. No intravenous contrast was administered. Study discontinued early at the patient's request, with intravenous contrast not administered and postcontrast sequences not obtained. COMPARISON:   07/24/2020 MRI pelvis.  09/25/2020 CT abdomen/pelvis. FINDINGS: Urinary Tract:  Normal bladder and urethra. Bowel: There is a 3.0 x 2.1 x 2.5 cm mildly T2 hyperintense mass in the posterior left pelvis adherent to the serosa of the sigmoid colon (series 5/image 4), with the suggestion of a tiny amount of nondependent internal gas. There is a separate nearby 2.2 x 1.7 x 1.3 cm T2 hyperintense mass in the sigmoid mesentery (series 5/image 1). There is a separate more anteriorly located thick walled 4.3 x 2.9 x 2.8 cm collection adherent to the sigmoid serosa with air-fluid level (series 3/image 21). There is mild wall thickening of the mid sigmoid colon. Vascular/Lymphatic: No appreciable acute vascular abnormality on this noncontrast scan. No pathologically enlarged inguinal nodes. Reproductive: Normal size anteverted uterus measuring 6.2 x 3.1 x 3.9 cm. No uterine fibroids. Thin endometrium (2 mm thickness) with no discrete endometrial mass and no endometrial cavity fluid. No discrete mass of the uterine cervix, noting this study is not tailored for evaluation of the uterus. The ovaries appear symmetric and small (series 6/image 3 on the right and image 5 on the left). Other: Mild thickening of the bilateral pelvic peritoneum (series 7/image 13 on the right and image 14 on the left). No pelvic ascites. Musculoskeletal: No aggressive appearing focal osseous lesions. IMPRESSION: 1. Limited noncontrast MRI pelvis study, discontinued early at the patient's request. 2. Thick-walled 4.3 x 2.9 x 2.8 cm collection adherent to the proximal sigmoid serosa in the left anterior pelvis with air-fluid level. Separate 3.0 x 2.1 x 2.5 cm T2 hyperintense mass in the posterior left pelvis adherent to the sigmoid serosa with suggestion of a tiny amount of nondependent gas. Separate  2.2 cm T2 hyperintense mass in the sigmoid mesentery. These findings are indeterminate, with differential including gas-containing pelvic abscesses versus  peritoneal metastatic disease of uncertain primary. Fistulous communication to the sigmoid colon not excluded. Surgical consultation suggested. 3. Mild thickening of the bilateral pelvic peritoneum, nonspecific, differential includes peritonitis vs. peritoneal carcinomatosis. Electronically Signed: By: Ilona Sorrel M.D. On: 09/26/2020 19:35   MR ABDOMEN WO CONTRAST  Addendum Date: 09/26/2020   ADDENDUM REPORT: 09/26/2020 19:49 ADDENDUM: These results were called by telephone at the time of interpretation on 09/26/2020 at 7:46 pm to provider DR. SHALHOUB, who verbally acknowledged these results. Upon further discussion with Dr. Cyd Silence, the patient is exhibiting clinical findings of sepsis with an elevated WBC count. The differential for the liver masses includes liver abscesses in addition to metastatic disease. Additionally the differential for the peritoneal thickening includes infectious peritonitis in addition to carcinomatosis. IR consultation for consideration of percutaneous biopsy/drainage of these liver lesions is suggested. Electronically Signed   By: Ilona Sorrel M.D.   On: 09/26/2020 19:49   Result Date: 09/26/2020 CLINICAL DATA:  Crohn disease. Indeterminate liver masses on unenhanced CT from earlier today. EXAM: MRI ABDOMEN WITHOUT CONTRAST TECHNIQUE: Multiplanar multisequence MR imaging was performed without the administration of intravenous contrast. Patient unable to tolerate full exam or IV contrast. Only axial and coronal T2 weighted imaging obtained. COMPARISON:  09/25/2020 CT abdomen/pelvis. FINDINGS: Motion degraded incomplete MRI abdomen study performed without IV contrast. Lower chest: Streaky curvilinear opacities at the dependent right greater than left lung bases bilaterally, not well evaluated by MRI. Hepatobiliary: Similar heterogeneous T2 signal intensity liver masses measuring 5.6 x 4.7 cm in the caudate lobe (series 15/image 16) and 5.3 x 4.0 cm in the posterior left liver  (series 15/image 19). Normal gallbladder with no cholelithiasis. Mild segmental intrahepatic biliary ductal dilatation in the lateral segment left liver lobe. No significant intrahepatic biliary ductal dilatation in the right liver. Common bile duct diameter 3 mm. No choledocholithiasis. No appreciable biliary masses or strictures. Pancreas: No pancreatic mass or duct dilation.  No pancreas divisum. Spleen: Normal size. No mass. Adrenals/Urinary Tract: Normal adrenals. No hydronephrosis. Simple appearing homogeneous T2 hyperintense left renal cysts, largest 2.3 cm in the lower left kidney. No overtly suspicious renal masses. Stomach/Bowel: Normal non-distended stomach. Visualized small and large bowel is normal caliber, with no bowel wall thickening. Vascular/Lymphatic: Nonaneurysmal abdominal aorta. No pathologically enlarged lymph nodes in the abdomen. Other: Trace abdominal ascites. Scattered mild generalized peritoneal thickening, for example in the right pericolic gutter (series 97/QBHAL 34) and left upper quadrant (series 15/image 25). No focal drainable fluid collection. Musculoskeletal: No aggressive appearing focal osseous lesions. IMPRESSION: 1. Limited motion degraded incomplete MRI abdomen study performed without IV contrast, discontinued prior to study completion at the patient's request. 2. Two similar indeterminate heterogeneous liver masses measuring 5.6 cm in the caudate lobe and 5.3 cm in the posterior left liver, incompletely evaluated. Metastatic disease not excluded. Consider IR consultation for potential ultrasound-guided liver biopsy. If clinically feasible, liver protocol CT abdomen and pelvis without and with IV contrast may be considered for further evaluation prior to potential biopsy. 3. Mild generalized peritoneal thickening. Trace abdominal ascites. 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  4. Streaky curvilinear opacities at the dependent right greater than left lung bases, not well evaluated by MRI. Chest CT may be obtained for further evaluation as clinically warranted. Electronically Signed: By: Corene Cornea  A Poff M.D. On: 09/26/2020 18:25   US Paracentesis  Result Date: 09/27/2020 INDICATION: 50 year old with indeterminate liver lesions and scheduled for ultrasound-guided liver aspiration or biopsy. EXAM: ULTRASOUND-GUIDED PARACENTESIS COMPARISON:  None. MEDICATIONS: Versed 1.5 mg, fentanyl 50 mcg. Patient was monitored by radiology nurse throughout the procedure. Sedation time: 19 minutes COMPLICATIONS: None immediate. TECHNIQUE: The procedure was explained to the patient. The risks and benefits of the procedure were discussed and the patient's questions were addressed. Informed consent was obtained from the patient. The abdomen was evaluated with ultrasound and perihepatic ascites was identified. Consent was also obtained for a paracentesis. Decision was made to perform paracentesis prior to biopsy. The anterior abdomen was prepped with chlorhexidine and sterile field was created. Skin was anesthetized using 1% lidocaine. A 19 gauge Yueh catheter was directed into the left upper abdominal fluid collection using ultrasound guidance. Opaque brown fluid was aspirated. Approximately 100 mL of fluid was removed. Based on the appearance of the fluid, liver biopsy was not performed at this time. Fluid was sent for culture and cytology. Bandage placed over the puncture site. FINDINGS: Small amount of perihepatic ascites along the left side of the abdomen. 100 mL of opaque brown fluid was removed. The lesions in the caudate and posterior left hepatic lobe were identified. The caudate lesion is more conspicuous. The posterior left hepatic lobe lesion is a subtle heterogeneous lesion. IMPRESSION: Successful ultrasound-guided paracentesis yielding 100 mL brown fluid from the left upper abdomen.  Ultrasound-guided liver biopsy was not performed at this time. Biopsy to the posterior left hepatic lobe appears to be feasible if needed in the future. Electronically Signed   By: Markus Daft M.D.   On: 09/27/2020 15:35      Assessment/Plan Intra-abdominal masses, malignancy vs infectious The patient's imaging, labs, etc have been reviewed and the patient seen and examined.  Her imaging is certainly concerning for malignancy.  She has esophageal thickening as well as antral thickening for which she is undergoing an EGD tomorrow.  Her peritoneal fluid was removed and cytology and cultures pending, but are currently negative.  All of her imaging was without contrast due to AKI on admission, although this has now resolved.  If needed based on findings vs no findings on EGD tomorrow, could repeat CT with IV contrast to better evaluate all these lesions.  Otherwise, would recommend if nothing found on EGD for biopsy, for IR eval for liver biopsy to help determine an etiology of her symptoms.  It certainly appears more c/w malignancy over infection at this time.  She had a colonoscopy 2 months ago concerning for crohn's disease but no other mass findings.  The mass at her sigmoid appears extrinsic so repeat c-scope would not be beneficial at this time.  At this point, tissue is needed for a diagnosis.  Nothing surgical is apparent at this time.  We will follow along and likely make her a prn for the weekend, but call us sooner if something new or urgent arises.   Possible crohn's disease Tobacco abuse GERD  Henreitta Cea, [facility] Surgery 09/28/2020, 4:12 PM Please see Amion for pager number during day hours 7:00am-4:30pm

## 2020-09-28 NOTE — Plan of Care (Signed)
  Problem: Education: Goal: Knowledge of General Education information will improve Description Including pain rating scale, medication(s)/side effects and non-pharmacologic comfort measures Outcome: Progressing   

## 2020-09-28 NOTE — Progress Notes (Signed)
Patient ID: Nicole Browning, female   DOB: 08/26/1970, 50 y.o.   MRN: [id number]  PROGRESS NOTE    Nicole Browning  HKV:[id number] DOB: 27-Jul-1970 DOA: 09/25/2020 PCP: Kerin Perna, NP   Brief Narrative:  50 year old female with recent diagnosis of IBD in April 2022 presented with abdominal pain, nausea and vomiting.  On presentation, she was slightly hypotensive which improved with IV fluids.  Creatinine was elevated at 2.54 along with AST of 133, ALT of 231 and total bilirubin of 0.8.  CT of the abdomen and pelvis showed features concerning for sigmoid mass with possible hepatic metastasis.  She was given a dose of IV Rocephin in the ED for possible UTI.  GI was consulted.  Assessment & Plan:   Acute renal failure -Possibly prerenal from dehydration and poor oral intake.   -Presented with creatinine of 2.54; prior creatinine was 0.79 -improving with iv fluids: 0.75 today.  Decrease IV fluids to 50 cc an hour.  Oral intake is still not that good.  Possible sigmoid mass with possible hepatic metastases versus abscesses Elevated LFTs -LFTs improving -GI following.  MRI of abdomen and pelvis showed sigmoid mass with air-fluid level along with separate hyperintense mass in the posterior left pelvis adherent to sigmoid serosa; this could be peritoneal metastatic disease versus pelvic abscesses and fistulous communication to the sigmoid colon could not be excluded.  2 liver masses were also identified, metastatic disease versus abscesses.   -Currently on Zosyn.  CRP still elevated. -Status post ultrasound-guided paracentesis on 09/19/2020: Small amount of perihepatic ascites was found in 100 cc fluid removed.  IR wanted to wait on culture and cytology reports and a few days of IV antibiotics prior to doing liver biopsy  Leukocytosis -Monitor  Recently diagnosed IBD -On Lialda.  Iron deficiency anemia -Hemoglobin stable.  Monitor  Hyponatremia -Improved.  IV fluids plan as  above.  Signs of sacroiliitis -Seen on CAT scan.  Hypoalbuminemia Severe malnutrition -From recent poor oral intake.  Monitor -Follow nutrition recommendations    DVT prophylaxis: SCDs. Code Status: Full Family Communication: None at bedside Disposition Plan: Status is: Inpatient because: Inpatient level of care appropriate due to severity of illness  Dispo: The patient is from: Home              Anticipated d/c is to: Home              Patient currently is not medically stable to d/c.   Difficult to place patient No   Consultants: GI/IR  Procedures: ultrasound-guided paracentesis on 09/19/2020: Small amount of perihepatic ascites was found in 100 cc fluid removed  Antimicrobials:  Anti-infectives (From admission, onward)    Start     Dose/Rate Route Frequency Ordered Stop   09/27/20 0145  piperacillin-tazobactam (ZOSYN) IVPB 3.375 g        3.375 g 12.5 mL/hr over 240 Minutes Intravenous Every 8 hours 09/27/20 0057     09/26/20 2200  cefTRIAXone (ROCEPHIN) 1 g in sodium chloride 0.9 % 100 mL IVPB  Status:  Discontinued        1 g 200 mL/hr over 30 Minutes Intravenous Every 24 hours 09/25/20 2331 09/26/20 0617   09/25/20 2300  cefTRIAXone (ROCEPHIN) 1 g in sodium chloride 0.9 % 100 mL IVPB        1 g 200 mL/hr over 30 Minutes Intravenous  Once 09/25/20 2245 09/25/20 2353         Subjective: Patient seen and examined at bedside.  Still  complains of intermittent abdominal pain and nausea.  No current vomiting.  Oral intake is not that good yet.  No overnight fever reported. Objective: Vitals:   09/27/20 1747 09/27/20 2105 09/28/20 0535 09/28/20 0940  BP: 104/61 121/75 129/81 121/76  Pulse: (!) 109 (!) 105 (!) 110 (!) 101  Resp: 17 18 18 18   Temp: 98.5 F (36.9 C) 98.6 F (37 C) 99.9 F (37.7 C) 98.3 F (36.8 C)  TempSrc:  Oral Oral   SpO2: 96% 100% 95% 95%  Weight:      Height:        Intake/Output Summary (Last 24 hours) at 09/28/2020 1009 Last data  filed at 09/28/2020 0800 Gross per 24 hour  Intake 2665.59 ml  Output --  Net 2665.59 ml    Filed Weights   09/26/20 2022  Weight: 56.7 kg    Examination:  General exam: Currently on room air.  No acute distress. Respiratory system: Bilateral decreased breath sounds at bases with some scattered crackles  cardiovascular system: Tachycardic, S1-S2 heard  gastrointestinal system: Abdomen is slightly distended, soft and mildly tender diffusely in the lower quadrant.  Normal bowel sounds heard  extremities: Trace lower extremity edema; no cyanosis  Central nervous system: Sleepy, wakes up slightly, very slow to respond.  No focal neurological deficits.  Moving extremities skin: No obvious lesions/petechiae Psychiatry: Affect is flat    Data Reviewed: I have personally reviewed following labs and imaging studies  CBC: Recent Labs  Lab 09/25/20 1641 09/26/20 0350 09/27/20 0343 09/28/20 0348  WBC 15.0* 12.7* 10.9* 11.5*  NEUTROABS  --  10.6* 8.3* 8.1*  HGB 11.7* 9.7* 8.5* 8.4*  HCT 36.5 29.4* 25.4* 25.4*  MCV 83.1 83.5 80.1 81.7  PLT 319 240 283 149    Basic Metabolic Panel: Recent Labs  Lab 09/25/20 1641 09/26/20 0350 09/27/20 0343 09/28/20 0348  NA 132* 131* 131* 135  K 3.9 3.9 3.3* 4.0  CL 94* 100 98 101  CO2 24 20* 25 28  GLUCOSE 116* 108* 119* 115*  BUN 43* 44* 10 8  CREATININE 2.54* 1.52* 0.78 0.75  CALCIUM 8.8* 8.1* 8.3* 8.8*  MG  --   --  2.2 1.8    GFR: Estimated Creatinine Clearance: 72.6 mL/min (by C-G formula based on SCr of 0.75 mg/dL). Liver Function Tests: Recent Labs  Lab 09/25/20 1641 09/26/20 0350 09/27/20 0343 09/28/20 0348  AST 133* 116* 55* 41  ALT 231* 186* 131* 99*  ALKPHOS 79 62 60 73  BILITOT 0.8 0.8 0.7 0.8  PROT 8.1 6.2* 5.8* 5.8*  ALBUMIN 2.6* 2.0* 1.8* 1.7*    Recent Labs  Lab 09/25/20 1641  LIPASE 22    No results for input(s): AMMONIA in the last 168 hours. Coagulation Profile: Recent Labs  Lab 09/27/20 0725   INR 1.2    Cardiac Enzymes: No results for input(s): CKTOTAL, CKMB, CKMBINDEX, TROPONINI in the last 168 hours. BNP (last 3 results) No results for input(s): PROBNP in the last 8760 hours. HbA1C: No results for input(s): HGBA1C in the last 72 hours. CBG: No results for input(s): GLUCAP in the last 168 hours. Lipid Profile: No results for input(s): CHOL, HDL, LDLCALC, TRIG, CHOLHDL, LDLDIRECT in the last 72 hours. Thyroid Function Tests: No results for input(s): TSH, T4TOTAL, FREET4, T3FREE, THYROIDAB in the last 72 hours. Anemia Panel: No results for input(s): VITAMINB12, FOLATE, FERRITIN, TIBC, IRON, RETICCTPCT in the last 72 hours. Sepsis Labs: Recent Labs  Lab 09/26/20 2140  PROCALCITON 15.92  LATICACIDVEN 1.1     Recent Results (from the past 240 hour(s))  Urine Culture     Status: Abnormal   Collection Time: 09/25/20 10:01 PM   Specimen: Urine, Clean Catch  Result Value Ref Range Status   Specimen Description URINE, CLEAN CATCH  Final   Special Requests   Final    NONE Performed at [facility], [address] [place], [place] [address]    Culture MULTIPLE SPECIES PRESENT, SUGGEST RECOLLECTION (A)  Final   Report Status 09/27/2020 FINAL  Final  Resp Panel by RT-PCR (Flu A&B, Covid) Nasopharyngeal Swab     Status: None   Collection Time: 09/25/20 10:09 PM   Specimen: Nasopharyngeal Swab; Nasopharyngeal(NP) swabs in vial transport medium  Result Value Ref Range Status   SARS Coronavirus 2 by RT PCR NEGATIVE NEGATIVE Final    Comment: (NOTE) SARS-CoV-2 target nucleic acids are NOT DETECTED.  The SARS-CoV-2 RNA is generally detectable in upper respiratory specimens during the acute phase of infection. The lowest concentration of SARS-CoV-2 viral copies this assay can detect is 138 copies/mL. A negative result does not preclude SARS-Cov-2 infection and should not be used as the sole basis for treatment or other patient management decisions. A negative  result may occur with  improper specimen collection/handling, submission of specimen other than nasopharyngeal swab, presence of viral mutation(s) within the areas targeted by this assay, and inadequate number of viral copies(<138 copies/mL). A negative result must be combined with clinical observations, patient history, and epidemiological information. The expected result is Negative.  Fact Sheet for Patients:  EntrepreneurPulse.com.au  Fact Sheet for Healthcare Providers:  IncredibleEmployment.be  This test is no t yet approved or cleared by the [place] FDA and  has been authorized for detection and/or diagnosis of SARS-CoV-2 by FDA under an Emergency Use Authorization (EUA). This EUA will remain  in effect (meaning this test can be used) for the duration of the COVID-19 declaration under Section 564(b)(1) of the Act, 21 U.S.C.section 360bbb-3(b)(1), unless the authorization is terminated  or revoked sooner.       Influenza A by PCR NEGATIVE NEGATIVE Final   Influenza B by PCR NEGATIVE NEGATIVE Final    Comment: (NOTE) The Xpert Xpress SARS-CoV-2/FLU/RSV plus assay is intended as an aid in the diagnosis of influenza from Nasopharyngeal swab specimens and should not be used as a sole basis for treatment. Nasal washings and aspirates are unacceptable for Xpert Xpress SARS-CoV-2/FLU/RSV testing.  Fact Sheet for Patients: EntrepreneurPulse.com.au  Fact Sheet for Healthcare Providers: IncredibleEmployment.be  This test is not yet approved or cleared by the [place] FDA and has been authorized for detection and/or diagnosis of SARS-CoV-2 by FDA under an Emergency Use Authorization (EUA). This EUA will remain in effect (meaning this test can be used) for the duration of the COVID-19 declaration under Section 564(b)(1) of the Act, 21 U.S.C. section 360bbb-3(b)(1), unless the authorization is  terminated or revoked.  Performed at [facility], [place] [address] [place], [place] [address]   Culture, blood (routine x 2)     Status: None (Preliminary result)   Collection Time: 09/26/20  9:40 PM   Specimen: BLOOD  Result Value Ref Range Status   Specimen Description BLOOD SITE NOT SPECIFIED  Final   Special Requests   Final    BOTTLES DRAWN AEROBIC AND ANAEROBIC Blood Culture adequate volume   Culture   Final    NO GROWTH < 24 HOURS Performed at [facility], [address] [place], [place] [address]    Report Status PENDING  Incomplete  Culture, blood (routine x 2)     Status: None (Preliminary result)   Collection Time: 09/26/20  9:46 PM   Specimen: BLOOD  Result Value Ref Range Status   Specimen Description BLOOD LEFT ANTECUBITAL  Final   Special Requests   Final    BOTTLES DRAWN AEROBIC AND ANAEROBIC Blood Culture adequate volume   Culture   Final    NO GROWTH < 24 HOURS Performed at [facility], [place] [address] [place], [place] [address]    Report Status PENDING  Incomplete  Body fluid culture w Gram Stain     Status: None (Preliminary result)   Collection Time: 09/27/20 11:30 AM   Specimen: Peritoneal Washings; Peritoneal Fluid  Result Value Ref Range Status   Specimen Description PERITONEAL FLUID  Final   Special Requests NONE  Final   Gram Stain   Final    ABUNDANT WBC PRESENT, PREDOMINANTLY PMN NO ORGANISMS SEEN    Culture   Final    NO GROWTH < 24 HOURS Performed at [facility], [address] [place], [place] [address]    Report Status PENDING  Incomplete          Radiology Studies: CT CHEST W CONTRAST  Result Date: 09/27/2020 CLINICAL DATA:  Cancer of unknown origin. EXAM: CT CHEST WITH CONTRAST TECHNIQUE: Multidetector CT imaging of the chest was performed during intravenous contrast administration. CONTRAST:  3m OMNIPAQUE IOHEXOL 300 MG/ML  SOLN COMPARISON:  September 26, 2020.  September 25, 2020. FINDINGS: Cardiovascular:  No significant vascular findings. Normal heart size. No pericardial effusion. Mediastinum/Nodes: Thyroid gland is unremarkable. Possible 2 cm subcarinal lymph node is noted. Wall thickening of distal esophagus is noted concerning for esophagitis. Lungs/Pleura: No pneumothorax is noted. Small left pleural effusion is noted. Mild bilateral posterior basilar subsegmental atelectasis or infiltrates are noted. Upper Abdomen: As noted on prior CT, hepatic masses are noted concerning for malignancy or metastatic disease. Musculoskeletal: No chest wall abnormality. No acute or significant osseous findings. IMPRESSION: Small left pleural effusion is noted. Mild bilateral posterior basilar subsegmental atelectasis or infiltrates are noted. Possible 2 cm subcarinal lymph node is noted which may represent metastatic disease. Wall thickening of distal esophagus is noted concerning for esophagitis. Endoscopy is recommended for further evaluation. Hepatic masses are again noted as described on prior CT scan, concerning for malignancy or metastatic disease. Electronically Signed   By: JMarijo ConceptionM.D.   On: 09/27/2020 20:01   MR PELVIS WO CONTRAST  Addendum Date: 09/26/2020   ADDENDUM REPORT: 09/26/2020 19:50 ADDENDUM: These results were called by telephone at the time of interpretation on 09/26/2020 at 7:49 pm to provider hospitalist DR. SHALHOUB, who verbally acknowledged these results. Electronically Signed   By: JIlona SorrelM.D.   On: 09/26/2020 19:50   Result Date: 09/26/2020 CLINICAL DATA:  50year old female with acute abdominal pain. Indeterminate perisigmoid masses and liver masses on noncontrast CT from 1 day prior. By report, no sigmoid mass identified on colonoscopy performed in May 2022. EXAM: MRI PELVIS WITHOUT CONTRAST TECHNIQUE: Multiplanar multisequence MR imaging of the pelvis was performed. No intravenous contrast was administered. Study discontinued early at the patient's request, with intravenous  contrast not administered and postcontrast sequences not obtained. COMPARISON:  07/24/2020 MRI pelvis.  09/25/2020 CT abdomen/pelvis. FINDINGS: Urinary Tract:  Normal bladder and urethra. Bowel: There is a 3.0 x 2.1 x 2.5  cm mildly T2 hyperintense mass in the posterior left pelvis adherent to the serosa of the sigmoid colon (series 5/image 4), with the suggestion of a tiny amount of nondependent internal gas. There is a separate nearby 2.2 x 1.7 x 1.3 cm T2 hyperintense mass in the sigmoid mesentery (series 5/image 1). There is a separate more anteriorly located thick walled 4.3 x 2.9 x 2.8 cm collection adherent to the sigmoid serosa with air-fluid level (series 3/image 21). There is mild wall thickening of the mid sigmoid colon. Vascular/Lymphatic: No appreciable acute vascular abnormality on this noncontrast scan. No pathologically enlarged inguinal nodes. Reproductive: Normal size anteverted uterus measuring 6.2 x 3.1 x 3.9 cm. No uterine fibroids. Thin endometrium (2 mm thickness) with no discrete endometrial mass and no endometrial cavity fluid. No discrete mass of the uterine cervix, noting this study is not tailored for evaluation of the uterus. The ovaries appear symmetric and small (series 6/image 3 on the right and image 5 on the left). Other: Mild thickening of the bilateral pelvic peritoneum (series 7/image 13 on the right and image 14 on the left). No pelvic ascites. Musculoskeletal: No aggressive appearing focal osseous lesions. IMPRESSION: 1. Limited noncontrast MRI pelvis study, discontinued early at the patient's request. 2. Thick-walled 4.3 x 2.9 x 2.8 cm collection adherent to the proximal sigmoid serosa in the left anterior pelvis with air-fluid level. Separate 3.0 x 2.1 x 2.5 cm T2 hyperintense mass in the posterior left pelvis adherent to the sigmoid serosa with suggestion of a tiny amount of nondependent gas. Separate 2.2 cm T2 hyperintense mass in the sigmoid mesentery. These findings are  indeterminate, with differential including gas-containing pelvic abscesses versus peritoneal metastatic disease of uncertain primary. Fistulous communication to the sigmoid colon not excluded. Surgical consultation suggested. 3. Mild thickening of the bilateral pelvic peritoneum, nonspecific, differential includes peritonitis vs. peritoneal carcinomatosis. Electronically Signed: By: Ilona Sorrel M.D. On: 09/26/2020 19:35   MR ABDOMEN WO CONTRAST  Addendum Date: 09/26/2020   ADDENDUM REPORT: 09/26/2020 19:49 ADDENDUM: These results were called by telephone at the time of interpretation on 09/26/2020 at 7:46 pm to provider DR. SHALHOUB, who verbally acknowledged these results. Upon further discussion with Dr. Cyd Silence, the patient is exhibiting clinical findings of sepsis with an elevated WBC count. The differential for the liver masses includes liver abscesses in addition to metastatic disease. Additionally the differential for the peritoneal thickening includes infectious peritonitis in addition to carcinomatosis. IR consultation for consideration of percutaneous biopsy/drainage of these liver lesions is suggested. Electronically Signed   By: Ilona Sorrel M.D.   On: 09/26/2020 19:49   Result Date: 09/26/2020 CLINICAL DATA:  Crohn disease. Indeterminate liver masses on unenhanced CT from earlier today. EXAM: MRI ABDOMEN WITHOUT CONTRAST TECHNIQUE: Multiplanar multisequence MR imaging was performed without the administration of intravenous contrast. Patient unable to tolerate full exam or IV contrast. Only axial and coronal T2 weighted imaging obtained. COMPARISON:  09/25/2020 CT abdomen/pelvis. FINDINGS: Motion degraded incomplete MRI abdomen study performed without IV contrast. Lower chest: Streaky curvilinear opacities at the dependent right greater than left lung bases bilaterally, not well evaluated by MRI. Hepatobiliary: Similar heterogeneous T2 signal intensity liver masses measuring 5.6 x 4.7 cm in the  caudate lobe (series 15/image 16) and 5.3 x 4.0 cm in the posterior left liver (series 15/image 19). Normal gallbladder with no cholelithiasis. Mild segmental intrahepatic biliary ductal dilatation in the lateral segment left liver lobe. No significant intrahepatic biliary ductal dilatation in the right liver. Common bile  duct diameter 3 mm. No choledocholithiasis. No appreciable biliary masses or strictures. Pancreas: No pancreatic mass or duct dilation.  No pancreas divisum. Spleen: Normal size. No mass. Adrenals/Urinary Tract: Normal adrenals. No hydronephrosis. Simple appearing homogeneous T2 hyperintense left renal cysts, largest 2.3 cm in the lower left kidney. No overtly suspicious renal masses. Stomach/Bowel: Normal non-distended stomach. Visualized small and large bowel is normal caliber, with no bowel wall thickening. Vascular/Lymphatic: Nonaneurysmal abdominal aorta. No pathologically enlarged lymph nodes in the abdomen. Other: Trace abdominal ascites. Scattered mild generalized peritoneal thickening, for example in the right pericolic gutter (series 65/VVZSM 34) and left upper quadrant (series 15/image 25). No focal drainable fluid collection. Musculoskeletal: No aggressive appearing focal osseous lesions. IMPRESSION: 1. Limited motion degraded incomplete MRI abdomen study performed without IV contrast, discontinued prior to study completion at the patient's request. 2. Two similar indeterminate heterogeneous liver masses measuring 5.6 cm in the caudate lobe and 5.3 cm in the posterior left liver, incompletely evaluated. Metastatic disease not excluded. Consider IR consultation for potential ultrasound-guided liver biopsy. If clinically feasible, liver protocol CT abdomen and pelvis without and with IV contrast may be considered for further evaluation prior to potential biopsy. 3. Mild generalized peritoneal thickening. Trace abdominal ascites. These findings are nonspecific but raise concern for  peritoneal carcinomatosis, as described on the noncontrast CT study from 1 day prior. 4. Streaky curvilinear opacities at the dependent right greater than left lung bases, not well evaluated by MRI. Chest CT may be obtained for further evaluation as clinically warranted. Electronically Signed: By: Ilona Sorrel M.D. On: 09/26/2020 18:25   US Paracentesis  Result Date: 09/27/2020 INDICATION: 50 year old with indeterminate liver lesions and scheduled for ultrasound-guided liver aspiration or biopsy. EXAM: ULTRASOUND-GUIDED PARACENTESIS COMPARISON:  None. MEDICATIONS: Versed 1.5 mg, fentanyl 50 mcg. Patient was monitored by radiology nurse throughout the procedure. Sedation time: 19 minutes COMPLICATIONS: None immediate. TECHNIQUE: The procedure was explained to the patient. The risks and benefits of the procedure were discussed and the patient's questions were addressed. Informed consent was obtained from the patient. The abdomen was evaluated with ultrasound and perihepatic ascites was identified. Consent was also obtained for a paracentesis. Decision was made to perform paracentesis prior to biopsy. The anterior abdomen was prepped with chlorhexidine and sterile field was created. Skin was anesthetized using 1% lidocaine. A 19 gauge Yueh catheter was directed into the left upper abdominal fluid collection using ultrasound guidance. Opaque brown fluid was aspirated. Approximately 100 mL of fluid was removed. Based on the appearance of the fluid, liver biopsy was not performed at this time. Fluid was sent for culture and cytology. Bandage placed over the puncture site. FINDINGS: Small amount of perihepatic ascites along the left side of the abdomen. 100 mL of opaque brown fluid was removed. The lesions in the caudate and posterior left hepatic lobe were identified. The caudate lesion is more conspicuous. The posterior left hepatic lobe lesion is a subtle heterogeneous lesion. IMPRESSION: Successful  ultrasound-guided paracentesis yielding 100 mL brown fluid from the left upper abdomen. Ultrasound-guided liver biopsy was not performed at this time. Biopsy to the posterior left hepatic lobe appears to be feasible if needed in the future. Electronically Signed   By: Markus Daft M.D.   On: 09/27/2020 15:35        Scheduled Meds:  feeding supplement  237 mL Oral TID BM   mesalamine  1.2 g Oral BID   multivitamin with minerals  1 tablet Oral Daily   Continuous Infusions:  0.9 % NaCl with KCl 40 mEq / L 75 mL/hr at 09/28/20 0303   piperacillin-tazobactam (ZOSYN)  IV 3.375 g (09/28/20 0534)          Aline August, MD [facility] 09/28/2020, 10:09 AM

## 2020-09-28 NOTE — H&P (View-Only) (Signed)
Daily Rounding Note  09/28/2020, 10:54 AM  LOS: 2 days   SUBJECTIVE:   Chief complaint:  sigmoid region mass.  Abdominal pain.     Pain persists but better.  Worse post prandial so not eating a lot.  Brown stool yest and again this AM.    OBJECTIVE:         Vital signs in last 24 hours:    Temp:  [98.3 F (36.8 C)-99.9 F (37.7 C)] 98.3 F (36.8 C) (07/29 0940) Pulse Rate:  [98-110] 101 (07/29 0940) Resp:  [16-24] 18 (07/29 0940) BP: (103-129)/(61-81) 121/76 (07/29 0940) SpO2:  [95 %-100 %] 95 % (07/29 0940) Last BM Date: 09/25/20 Filed Weights   09/26/20 2022  Weight: 56.7 kg   General: NAD.  Alert.  comfortable   Heart: RRR Chest: no dyspnea, no cough Abdomen: firm but not hard, ND.  Less tender.  Active BS.  Extremities: no CCE Neuro/Psych:  oriented x 3.  No weakness or tremors.    Intake/Output from previous day: 07/28 0701 - 07/29 0700 In: 2228.6 [P.O.:797; I.V.:1253; IV Piggyback:178.6] Out: -   Intake/Output this shift: Total I/O In: 437 [P.O.:200; Other:237] Out: -   Lab Results: Recent Labs    09/26/20 0350 09/27/20 0343 09/28/20 0348  WBC 12.7* 10.9* 11.5*  HGB 9.7* 8.5* 8.4*  HCT 29.4* 25.4* 25.4*  PLT 240 283 322   BMET Recent Labs    09/26/20 0350 09/27/20 0343 09/28/20 0348  NA 131* 131* 135  K 3.9 3.3* 4.0  CL 100 98 101  CO2 20* 25 28  GLUCOSE 108* 119* 115*  BUN 44* 10 8  CREATININE 1.52* 0.78 0.75  CALCIUM 8.1* 8.3* 8.8*   LFT Recent Labs    09/26/20 0350 09/27/20 0343 09/28/20 0348  PROT 6.2* 5.8* 5.8*  ALBUMIN 2.0* 1.8* 1.7*  AST 116* 55* 41  ALT 186* 131* 99*  ALKPHOS 62 60 73  BILITOT 0.8 0.7 0.8  BILIDIR 0.3*  --   --   IBILI 0.5  --   --    PT/INR Recent Labs    09/27/20 0725  LABPROT 14.7  INR 1.2   Hepatitis Panel No results for input(s): HEPBSAG, HCVAB, HEPAIGM, HEPBIGM in the last 72 hours.  Studies/Results: CT CHEST W  CONTRAST  Result Date: 09/27/2020 CLINICAL DATA:  Cancer of unknown origin. EXAM: CT CHEST WITH CONTRAST TECHNIQUE: Multidetector CT imaging of the chest was performed during intravenous contrast administration. CONTRAST:  38m OMNIPAQUE IOHEXOL 300 MG/ML  SOLN COMPARISON:  September 26, 2020.  September 25, 2020. FINDINGS: Cardiovascular: No significant vascular findings. Normal heart size. No pericardial effusion. Mediastinum/Nodes: Thyroid gland is unremarkable. Possible 2 cm subcarinal lymph node is noted. Wall thickening of distal esophagus is noted concerning for esophagitis. Lungs/Pleura: No pneumothorax is noted. Small left pleural effusion is noted. Mild bilateral posterior basilar subsegmental atelectasis or infiltrates are noted. Upper Abdomen: As noted on prior CT, hepatic masses are noted concerning for malignancy or metastatic disease. Musculoskeletal: No chest wall abnormality. No acute or significant osseous findings. IMPRESSION: Small left pleural effusion is noted. Mild bilateral posterior basilar subsegmental atelectasis or infiltrates are noted. Possible 2 cm subcarinal lymph node is noted which may represent metastatic disease. Wall thickening of distal esophagus is noted concerning for esophagitis. Endoscopy is recommended for further evaluation. Hepatic masses are again noted as described on prior CT scan, concerning for malignancy or metastatic disease. Electronically Signed  By: Marijo Conception M.D.   On: 09/27/2020 20:01   MR PELVIS WO CONTRAST  Addendum Date: 09/26/2020   ADDENDUM REPORT: 09/26/2020 19:50 ADDENDUM: These results were called by telephone at the time of interpretation on 09/26/2020 at 7:49 pm to provider hospitalist DR. SHALHOUB, who verbally acknowledged these results. Electronically Signed   By: Ilona Sorrel M.D.   On: 09/26/2020 19:50   Result Date: 09/26/2020 CLINICAL DATA:  50 year old female with acute abdominal pain. Indeterminate perisigmoid masses and liver masses on  noncontrast CT from 1 day prior. By report, no sigmoid mass identified on colonoscopy performed in May 2022. EXAM: MRI PELVIS WITHOUT CONTRAST TECHNIQUE: Multiplanar multisequence MR imaging of the pelvis was performed. No intravenous contrast was administered. Study discontinued early at the patient's request, with intravenous contrast not administered and postcontrast sequences not obtained. COMPARISON:  07/24/2020 MRI pelvis.  09/25/2020 CT abdomen/pelvis. FINDINGS: Urinary Tract:  Normal bladder and urethra. Bowel: There is a 3.0 x 2.1 x 2.5 cm mildly T2 hyperintense mass in the posterior left pelvis adherent to the serosa of the sigmoid colon (series 5/image 4), with the suggestion of a tiny amount of nondependent internal gas. There is a separate nearby 2.2 x 1.7 x 1.3 cm T2 hyperintense mass in the sigmoid mesentery (series 5/image 1). There is a separate more anteriorly located thick walled 4.3 x 2.9 x 2.8 cm collection adherent to the sigmoid serosa with air-fluid level (series 3/image 21). There is mild wall thickening of the mid sigmoid colon. Vascular/Lymphatic: No appreciable acute vascular abnormality on this noncontrast scan. No pathologically enlarged inguinal nodes. Reproductive: Normal size anteverted uterus measuring 6.2 x 3.1 x 3.9 cm. No uterine fibroids. Thin endometrium (2 mm thickness) with no discrete endometrial mass and no endometrial cavity fluid. No discrete mass of the uterine cervix, noting this study is not tailored for evaluation of the uterus. The ovaries appear symmetric and small (series 6/image 3 on the right and image 5 on the left). Other: Mild thickening of the bilateral pelvic peritoneum (series 7/image 13 on the right and image 14 on the left). No pelvic ascites. Musculoskeletal: No aggressive appearing focal osseous lesions. IMPRESSION: 1. Limited noncontrast MRI pelvis study, discontinued early at the patient's request. 2. Thick-walled 4.3 x 2.9 x 2.8 cm collection  adherent to the proximal sigmoid serosa in the left anterior pelvis with air-fluid level. Separate 3.0 x 2.1 x 2.5 cm T2 hyperintense mass in the posterior left pelvis adherent to the sigmoid serosa with suggestion of a tiny amount of nondependent gas. Separate 2.2 cm T2 hyperintense mass in the sigmoid mesentery. These findings are indeterminate, with differential including gas-containing pelvic abscesses versus peritoneal metastatic disease of uncertain primary. Fistulous communication to the sigmoid colon not excluded. Surgical consultation suggested. 3. Mild thickening of the bilateral pelvic peritoneum, nonspecific, differential includes peritonitis vs. peritoneal carcinomatosis. Electronically Signed: By: Ilona Sorrel M.D. On: 09/26/2020 19:35   MR ABDOMEN WO CONTRAST  Addendum Date: 09/26/2020   ADDENDUM REPORT: 09/26/2020 19:49 ADDENDUM: These results were called by telephone at the time of interpretation on 09/26/2020 at 7:46 pm to provider DR. SHALHOUB, who verbally acknowledged these results. Upon further discussion with Dr. Cyd Silence, the patient is exhibiting clinical findings of sepsis with an elevated WBC count. The differential for the liver masses includes liver abscesses in addition to metastatic disease. Additionally the differential for the peritoneal thickening includes infectious peritonitis in addition to carcinomatosis. IR consultation for consideration of percutaneous biopsy/drainage of these liver  lesions is suggested. Electronically Signed   By: Ilona Sorrel M.D.   On: 09/26/2020 19:49   Result Date: 09/26/2020 CLINICAL DATA:  Crohn disease. Indeterminate liver masses on unenhanced CT from earlier today. EXAM: MRI ABDOMEN WITHOUT CONTRAST TECHNIQUE: Multiplanar multisequence MR imaging was performed without the administration of intravenous contrast. Patient unable to tolerate full exam or IV contrast. Only axial and coronal T2 weighted imaging obtained. COMPARISON:  09/25/2020 CT  abdomen/pelvis. FINDINGS: Motion degraded incomplete MRI abdomen study performed without IV contrast. Lower chest: Streaky curvilinear opacities at the dependent right greater than left lung bases bilaterally, not well evaluated by MRI. Hepatobiliary: Similar heterogeneous T2 signal intensity liver masses measuring 5.6 x 4.7 cm in the caudate lobe (series 15/image 16) and 5.3 x 4.0 cm in the posterior left liver (series 15/image 19). Normal gallbladder with no cholelithiasis. Mild segmental intrahepatic biliary ductal dilatation in the lateral segment left liver lobe. No significant intrahepatic biliary ductal dilatation in the right liver. Common bile duct diameter 3 mm. No choledocholithiasis. No appreciable biliary masses or strictures. Pancreas: No pancreatic mass or duct dilation.  No pancreas divisum. Spleen: Normal size. No mass. Adrenals/Urinary Tract: Normal adrenals. No hydronephrosis. Simple appearing homogeneous T2 hyperintense left renal cysts, largest 2.3 cm in the lower left kidney. No overtly suspicious renal masses. Stomach/Bowel: Normal non-distended stomach. Visualized small and large bowel is normal caliber, with no bowel wall thickening. Vascular/Lymphatic: Nonaneurysmal abdominal aorta. No pathologically enlarged lymph nodes in the abdomen. Other: Trace abdominal ascites. Scattered mild generalized peritoneal thickening, for example in the right pericolic gutter (series 85/IOEVO 34) and left upper quadrant (series 15/image 25). No focal drainable fluid collection. Musculoskeletal: No aggressive appearing focal osseous lesions. IMPRESSION: 1. Limited motion degraded incomplete MRI abdomen study performed without IV contrast, discontinued prior to study completion at the patient's request. 2. Two similar indeterminate heterogeneous liver masses measuring 5.6 cm in the caudate lobe and 5.3 cm in the posterior left liver, incompletely evaluated. Metastatic disease not excluded. Consider IR  consultation for potential ultrasound-guided liver biopsy. If clinically feasible, liver protocol CT abdomen and pelvis without and with IV contrast may be considered for further evaluation prior to potential biopsy. 3. Mild generalized peritoneal thickening. Trace abdominal ascites. 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 4. Streaky curvilinear opacities at the dependent right greater than left lung bases, not well evaluated by MRI. Chest CT may be obtained for further evaluation as clinically warranted. Electronically Signed: By: Ilona Sorrel M.D. On: 09/26/2020 18:25   US Paracentesis  Result Date: 09/27/2020 INDICATION: 50 year old with indeterminate liver lesions and scheduled for ultrasound-guided liver aspiration or biopsy. EXAM: ULTRASOUND-GUIDED PARACENTESIS COMPARISON:  None. MEDICATIONS: Versed 1.5 mg, fentanyl 50 mcg. Patient was monitored by radiology nurse throughout the procedure. Sedation time: 19 minutes COMPLICATIONS: None immediate. TECHNIQUE: The procedure was explained to the patient. The risks and benefits of the procedure were discussed and the patient's questions were addressed. Informed consent was obtained from the patient. The abdomen was evaluated with ultrasound and perihepatic ascites was identified. Consent was also obtained for a paracentesis. Decision was made to perform paracentesis prior to biopsy. The anterior abdomen was prepped with chlorhexidine and sterile field was created. Skin was anesthetized using 1% lidocaine. A 19 gauge Yueh catheter was directed into the left upper abdominal fluid collection using ultrasound guidance. Opaque brown fluid was aspirated. Approximately 100 mL of fluid was removed. Based  on the appearance of the fluid, liver biopsy was not performed at this time. Fluid was sent for culture and cytology. Bandage placed over the puncture site. FINDINGS:  Small amount of perihepatic ascites along the left side of the abdomen. 100 mL of opaque brown fluid was removed. The lesions in the caudate and posterior left hepatic lobe were identified. The caudate lesion is more conspicuous. The posterior left hepatic lobe lesion is a subtle heterogeneous lesion. IMPRESSION: Successful ultrasound-guided paracentesis yielding 100 mL brown fluid from the left upper abdomen. Ultrasound-guided liver biopsy was not performed at this time. Biopsy to the posterior left hepatic lobe appears to be feasible if needed in the future. Electronically Signed   By: Markus Daft M.D.   On: 09/27/2020 15:35    Scheduled Meds:  feeding supplement  237 mL Oral TID BM   mesalamine  1.2 g Oral BID   multivitamin with minerals  1 tablet Oral Daily   Continuous Infusions:  0.9 % NaCl with KCl 40 mEq / L 75 mL/hr at 09/28/20 0303   piperacillin-tazobactam (ZOSYN)  IV 3.375 g (09/28/20 0534)   PRN Meds:.morphine injection, ondansetron **OR** ondansetron (ZOFRAN) IV  ASSESMENT:   Abdominal pain.  Multiple imaging abnormalities including liver masses, generalized peritoneal thickening, trace abdominal ascites fluid collection adherent to sigmoid serosa.  Left pelvic mass also adherent to sigmoid serosa.  Differential includes gas containing pelvic abscesses versus peritoneal metastatic disease of uncertain primary.  Fistulous communication to sigmoid colon not excluded.  R/O metastatic dz vs infectious /abscesses.  CEA, CA125, CA 19-9 all within normal limits.  + leukocytosis, Day 3 empiric Zosyn.  LFTs improved.  CT chest w subcarinal lymph node, "may represent metastatic dz", distal esoph wall thickening.  Liver masses still concerning for malignancy or mets.      S/p diagnostic paracentesis 7/28.   Prelim fluid studies w abundant WBCs, primarily PMNs, no organisms, no growth at less than 24 hours.  Fluid cytology in progress.     06/22/2020 colonoscopy with inflammatory polyp resection,  mild pancolitis and ileitis, probably Crohn's, started on Lialda.  Multiple drainage procedures over the last few years for vulvar and perirectal abscesses.   Hyponatremia.  Hypokalemia. Hypoalbuminemia.        IDA. Low iron, borderline low ferritin in May 2022.  Did not tolerate oral iron and only took this for a couple of weeks in May. Hgb 11.7 >> 8.5 over the last 3 days.    PLAN     Ferrelicit x 2 doses.  Check pre-albumin in AM.      ? Pursue EGD?    Await ascitic fluid cytology.      Azucena Freed  09/28/2020, 10:54 AM Phone [phone]     Attending physician's note   I have taken an interval history, reviewed the chart and examined the patient. I agree with the Advanced Practitioner's note, impression and recommendations.   ?  Peritoneal carcinomatosis with liver lesions vs inflammatory processes. Neg fluid cytology and culture from paracenteses 7/28. CT chest showing 2 cm subcarinal lymph node which may represent metastatic disease.  Wall thickening of the distal esophagus and recommend EGD.  There was gastric wall thickening noted as well.  Neg colon except for IBD, Nl CEA, CA 19-9, CA125  Plan: -EGD in a.m. -Surgical consultation as suggested by radiology (MRI pelvis)-nonurgent, for tomorrow. -Continue IV Zosyn for now. -Reeval for IR guided liver biopsy (over the weekend or early next week).  Carmell Austria, MD [facility] [phone]

## 2020-09-29 ENCOUNTER — Inpatient Hospital Stay (HOSPITAL_COMMUNITY): Payer: Medicaid Other | Admitting: Certified Registered Nurse Anesthetist

## 2020-09-29 ENCOUNTER — Encounter (HOSPITAL_COMMUNITY)
Admission: EM | Disposition: A | Discharge status: Home / Self Care | Payer: Self-pay | Source: Home / Self Care | Attending: Internal Medicine

## 2020-09-29 DIAGNOSIS — K6389 Other specified diseases of intestine: Secondary | ICD-10-CM | POA: Diagnosis not present

## 2020-09-29 DIAGNOSIS — E43 Unspecified severe protein-calorie malnutrition: Secondary | ICD-10-CM

## 2020-09-29 DIAGNOSIS — N179 Acute kidney failure, unspecified: Secondary | ICD-10-CM | POA: Diagnosis not present

## 2020-09-29 DIAGNOSIS — R160 Hepatomegaly, not elsewhere classified: Secondary | ICD-10-CM | POA: Diagnosis not present

## 2020-09-29 DIAGNOSIS — K2970 Gastritis, unspecified, without bleeding: Secondary | ICD-10-CM

## 2020-09-29 HISTORY — PX: ESOPHAGOGASTRODUODENOSCOPY (EGD) WITH PROPOFOL: SHX5813

## 2020-09-29 HISTORY — PX: BIOPSY: SHX5522

## 2020-09-29 LAB — CBC WITH DIFFERENTIAL/PLATELET
Abs Immature Granulocytes: 0.66 10*3/uL — ABNORMAL HIGH (ref 0.00–0.07)
Basophils Absolute: 0 10*3/uL (ref 0.0–0.1)
Basophils Relative: 0 %
Eosinophils Absolute: 0.1 10*3/uL (ref 0.0–0.5)
Eosinophils Relative: 0 %
HCT: 24.4 % — ABNORMAL LOW (ref 36.0–46.0)
Hemoglobin: 8.2 g/dL — ABNORMAL LOW (ref 12.0–15.0)
Immature Granulocytes: 4 %
Lymphocytes Relative: 12 %
Lymphs Abs: 2 10*3/uL (ref 0.7–4.0)
MCH: 27.3 pg (ref 26.0–34.0)
MCHC: 33.6 g/dL (ref 30.0–36.0)
MCV: 81.3 fL (ref 80.0–100.0)
Monocytes Absolute: 1.9 10*3/uL — ABNORMAL HIGH (ref 0.1–1.0)
Monocytes Relative: 11 %
Neutro Abs: 12 10*3/uL — ABNORMAL HIGH (ref 1.7–7.7)
Neutrophils Relative %: 73 %
Platelets: 353 10*3/uL (ref 150–400)
RBC: 3 MIL/uL — ABNORMAL LOW (ref 3.87–5.11)
RDW: 16 % — ABNORMAL HIGH (ref 11.5–15.5)
WBC: 16.7 10*3/uL — ABNORMAL HIGH (ref 4.0–10.5)
nRBC: 0 % (ref 0.0–0.2)

## 2020-09-29 LAB — COMPREHENSIVE METABOLIC PANEL
ALT: 77 U/L — ABNORMAL HIGH (ref 0–44)
AST: 35 U/L (ref 15–41)
Albumin: 1.6 g/dL — ABNORMAL LOW (ref 3.5–5.0)
Alkaline Phosphatase: 71 U/L (ref 38–126)
Anion gap: 7 (ref 5–15)
BUN: 5 mg/dL — ABNORMAL LOW (ref 6–20)
CO2: 26 mmol/L (ref 22–32)
Calcium: 8.7 mg/dL — ABNORMAL LOW (ref 8.9–10.3)
Chloride: 103 mmol/L (ref 98–111)
Creatinine, Ser: 0.66 mg/dL (ref 0.44–1.00)
GFR, Estimated: >60 mL/min (ref >60)
Glucose, Bld: 110 mg/dL — ABNORMAL HIGH (ref 70–99)
Potassium: 4.1 mmol/L (ref 3.5–5.1)
Sodium: 136 mmol/L (ref 135–145)
Total Bilirubin: 0.9 mg/dL (ref 0.3–1.2)
Total Protein: 5.7 g/dL — ABNORMAL LOW (ref 6.5–8.1)

## 2020-09-29 LAB — PREALBUMIN: Prealbumin: <5 mg/dL — ABNORMAL LOW (ref 18–38)

## 2020-09-29 LAB — MAGNESIUM: Magnesium: 1.7 mg/dL (ref 1.7–2.4)

## 2020-09-29 LAB — C-REACTIVE PROTEIN: CRP: 23.9 mg/dL — ABNORMAL HIGH (ref <1.0)

## 2020-09-29 SURGERY — ESOPHAGOGASTRODUODENOSCOPY (EGD) WITH PROPOFOL
Anesthesia: Monitor Anesthesia Care

## 2020-09-29 MED ORDER — PROPOFOL 10 MG/ML IV BOLUS
INTRAVENOUS | Status: DC | PRN
  Administered 2020-09-29 (×5): 20 mg via INTRAVENOUS

## 2020-09-29 MED ORDER — LACTATED RINGERS IV SOLN: INTRAVENOUS | Status: DC | PRN

## 2020-09-29 MED ORDER — LIDOCAINE 2% (20 MG/ML) 5 ML SYRINGE
INTRAMUSCULAR | Status: DC | PRN
  Administered 2020-09-29: 60 mg via INTRAVENOUS

## 2020-09-29 MED ORDER — ONDANSETRON HCL 4 MG/2ML IJ SOLN
INTRAMUSCULAR | Status: AC
  Filled 2020-09-29: qty 2

## 2020-09-29 MED ORDER — PROPOFOL 500 MG/50ML IV EMUL
INTRAVENOUS | Status: DC | PRN
  Administered 2020-09-29: 150 ug/kg/min via INTRAVENOUS

## 2020-09-29 SURGICAL SUPPLY — 15 items

## 2020-09-29 NOTE — Anesthesia Preprocedure Evaluation (Addendum)
Anesthesia Evaluation  Patient identified by MRN, date of birth, ID band Patient awake    Reviewed: Allergy & Precautions, NPO status , Patient's Chart, lab work & pertinent test results  History of Anesthesia Complications Negative for: history of anesthetic complications  Airway Mallampati: III  TM Distance: >3 FB Neck ROM: Full    Dental  (+) Dental Advisory Given   Pulmonary neg shortness of breath, neg sleep apnea, neg COPD, neg recent URI, Current Smoker and Patient abstained from smoking.,  Covid-19 Nucleic Acid Test Results Lab Results      Component                Value               Date                      [place]              NEGATIVE            09/25/2020                [place]              NEGATIVE            03/10/2020                [place]              NEGATIVE            06/23/2019                [place]              NOT DETECTED        03/03/2019              breath sounds clear to auscultation       Cardiovascular negative cardio ROS   Rhythm:Regular     Neuro/Psych negative neurological ROS  negative psych ROS   GI/Hepatic Neg liver ROS, PUD, GERD  ,abdominal pain.  esophagitis per CT chest   Endo/Other  negative endocrine ROS  Renal/GU Lab Results      Component                Value               Date                      CREATININE               0.66                09/29/2020           Lab Results      Component                Value               Date                      K                        4.1                 09/29/2020                Musculoskeletal negative musculoskeletal ROS (+)   Abdominal   Peds  Hematology  (+) Blood dyscrasia, anemia , Lab Results      Component                Value               Date                      WBC                      16.7 (H)            09/29/2020                HGB                      8.2 (L)             09/29/2020                 HCT                      24.4 (L)            09/29/2020                MCV                      81.3                09/29/2020                PLT                      353                 09/29/2020              Anesthesia Other Findings   Reproductive/Obstetrics                            Anesthesia Physical Anesthesia Plan  ASA: 2  Anesthesia Plan: MAC   Post-op Pain Management:    Induction:   PONV Risk Score and Plan: 1 and Propofol infusion and Treatment may vary due to age or medical condition  Airway Management Planned: Nasal Cannula  Additional Equipment: None  Intra-op Plan:   Post-operative Plan:   Informed Consent: I have reviewed the patients History and Physical, chart, labs and discussed the procedure including the risks, benefits and alternatives for the proposed anesthesia with the patient or authorized representative who has indicated his/her understanding and acceptance.     Dental advisory given  Plan Discussed with: Anesthesiologist and CRNA  Anesthesia Plan Comments:         Anesthesia Quick Evaluation

## 2020-09-29 NOTE — Progress Notes (Signed)
Patient ID: Nicole Browning, female   DOB: 24-Jun-1970, 50 y.o.   MRN: [id number]  PROGRESS NOTE    Timira Bieda  XTG:[id number] DOB: 1970/04/04 DOA: 09/25/2020 PCP: Kerin Perna, NP   Brief Narrative:  50 year old female with recent diagnosis of IBD in April 2022 presented with abdominal pain, nausea and vomiting.  On presentation, she was slightly hypotensive which improved with IV fluids.  Creatinine was elevated at 2.54 along with AST of 133, ALT of 231 and total bilirubin of 0.8.  CT of the abdomen and pelvis showed features concerning for sigmoid mass with possible hepatic metastasis.  She was given a dose of IV Rocephin in the ED for possible UTI.  GI was consulted.  Assessment & Plan:   Acute renal failure -Possibly prerenal from dehydration and poor oral intake.   -Presented with creatinine of 2.54; prior creatinine was 0.79 -improving with iv fluids: 0.75 today.  Continue IV fluids at 50 cc an hour.  Oral intake is still not that good.  Possible sigmoid mass with possible hepatic metastases versus abscesses Elevated LFTs -LFTs improving -GI following.  MRI of abdomen and pelvis showed sigmoid mass with air-fluid level along with separate hyperintense mass in the posterior left pelvis adherent to sigmoid serosa; this could be peritoneal metastatic disease versus pelvic abscesses and fistulous communication to the sigmoid colon could not be excluded.  2 liver masses were also identified, metastatic disease versus abscesses.  CT chest showed subcarinal lymph node with concern for metastatic disease and distal esophageal wall thickening. -Currently on Zosyn.  CRP still elevated but slightly improving today. -Status post ultrasound-guided paracentesis on 09/19/2020: Small amount of perihepatic ascites was found in 100 cc fluid removed.  IR wanted to wait on culture and cytology reports and a few days of IV antibiotics prior to doing liver biopsy -GI planning for EGD  today.  Leukocytosis -Worsening  Recently diagnosed IBD -On Lialda.  Iron deficiency anemia -Hemoglobin stable.  Monitor  Hyponatremia -Improved.  IV fluids plan as above.  Signs of sacroiliitis -Seen on CAT scan.  Hypoalbuminemia Severe malnutrition -From recent poor oral intake.  Monitor -Follow nutrition recommendations    DVT prophylaxis: SCDs. Code Status: Full Family Communication: None at bedside Disposition Plan: Status is: Inpatient because: Inpatient level of care appropriate due to severity of illness  Dispo: The patient is from: Home              Anticipated d/c is to: Home              Patient currently is not medically stable to d/c.   Difficult to place patient No   Consultants: GI/IR  Procedures: ultrasound-guided paracentesis on 09/19/2020: Small amount of perihepatic ascites was found in 100 cc fluid removed  Antimicrobials:  Anti-infectives (From admission, onward)    Start     Dose/Rate Route Frequency Ordered Stop   09/27/20 0145  [MAR Hold]  piperacillin-tazobactam (ZOSYN) IVPB 3.375 g        (MAR Hold since Sat 09/29/2020 at 0912.Hold Reason: Transfer to a Procedural area)   3.375 g 12.5 mL/hr over 240 Minutes Intravenous Every 8 hours 09/27/20 0057     09/26/20 2200  cefTRIAXone (ROCEPHIN) 1 g in sodium chloride 0.9 % 100 mL IVPB  Status:  Discontinued        1 g 200 mL/hr over 30 Minutes Intravenous Every 24 hours 09/25/20 2331 09/26/20 0617   09/25/20 2300  cefTRIAXone (ROCEPHIN) 1 g in sodium chloride 0.9 %  100 mL IVPB        1 g 200 mL/hr over 30 Minutes Intravenous  Once 09/25/20 2245 09/25/20 2353         Subjective: Patient seen and examined at bedside.  Still complains of intermittent abdominal pain.  Currently denies any vomiting.  Oral intake is still poor.   Objective: Vitals:   09/28/20 1745 09/28/20 2043 09/29/20 0611 09/29/20 0912  BP: 119/68 127/84 126/78 135/85  Pulse: (!) 103 (!) 110 (!) 106 (!) 106  Resp: 18 18  18 17   Temp: 98.2 F (36.8 C) 99.2 F (37.3 C) 99.4 F (37.4 C) 98.3 F (36.8 C)  TempSrc:  Oral Oral Oral  SpO2: 95% 92% 91% 100%  Weight:    56.7 kg  Height:    5' 4"  (1.626 m)    Intake/Output Summary (Last 24 hours) at 09/29/2020 0922 Last data filed at 09/29/2020 0609 Gross per 24 hour  Intake 2439.17 ml  Output --  Net 2439.17 ml    Filed Weights   09/26/20 2022 09/29/20 0912  Weight: 56.7 kg 56.7 kg    Examination:  General exam: No acute distress.  Still on room air.   Respiratory system: Decreased breath sounds at bases bilaterally with some crackles  cardiovascular system: S1-S2 heard, tachycardic  gastrointestinal system: Abdomen is still distended, soft and mildly tender diffusely in the lower quadrant.  Bowel sounds are heard extremities: No clubbing; mild bilateral lower extremity edema present  Central nervous system: Awake, still slow to respond.  No focal neurological deficits.  Moves extremities  skin: No obvious ecchymosis/rashes  psychiatry: Does not participate in conversation much.  Flat affect.   Data Reviewed: I have personally reviewed following labs and imaging studies  CBC: Recent Labs  Lab 09/25/20 1641 09/26/20 0350 09/27/20 0343 09/28/20 0348 09/29/20 0224  WBC 15.0* 12.7* 10.9* 11.5* 16.7*  NEUTROABS  --  10.6* 8.3* 8.1* 12.0*  HGB 11.7* 9.7* 8.5* 8.4* 8.2*  HCT 36.5 29.4* 25.4* 25.4* 24.4*  MCV 83.1 83.5 80.1 81.7 81.3  PLT 319 240 283 322 081    Basic Metabolic Panel: Recent Labs  Lab 09/25/20 1641 09/26/20 0350 09/27/20 0343 09/28/20 0348 09/29/20 0224  NA 132* 131* 131* 135 136  K 3.9 3.9 3.3* 4.0 4.1  CL 94* 100 98 101 103  CO2 24 20* 25 28 26   GLUCOSE 116* 108* 119* 115* 110*  BUN 43* 44* 10 8 5*  CREATININE 2.54* 1.52* 0.78 0.75 0.66  CALCIUM 8.8* 8.1* 8.3* 8.8* 8.7*  MG  --   --  2.2 1.8 1.7    GFR: Estimated Creatinine Clearance: 72.6 mL/min (by C-G formula based on SCr of 0.66 mg/dL). Liver Function  Tests: Recent Labs  Lab 09/25/20 1641 09/26/20 0350 09/27/20 0343 09/28/20 0348 09/29/20 0224  AST 133* 116* 55* 41 35  ALT 231* 186* 131* 99* 77*  ALKPHOS 79 62 60 73 71  BILITOT 0.8 0.8 0.7 0.8 0.9  PROT 8.1 6.2* 5.8* 5.8* 5.7*  ALBUMIN 2.6* 2.0* 1.8* 1.7* 1.6*    Recent Labs  Lab 09/25/20 1641  LIPASE 22    No results for input(s): AMMONIA in the last 168 hours. Coagulation Profile: Recent Labs  Lab 09/27/20 0725  INR 1.2    Cardiac Enzymes: No results for input(s): CKTOTAL, CKMB, CKMBINDEX, TROPONINI in the last 168 hours. BNP (last 3 results) No results for input(s): PROBNP in the last 8760 hours. HbA1C: No results for input(s): HGBA1C in the  last 72 hours. CBG: No results for input(s): GLUCAP in the last 168 hours. Lipid Profile: No results for input(s): CHOL, HDL, LDLCALC, TRIG, CHOLHDL, LDLDIRECT in the last 72 hours. Thyroid Function Tests: No results for input(s): TSH, T4TOTAL, FREET4, T3FREE, THYROIDAB in the last 72 hours. Anemia Panel: No results for input(s): VITAMINB12, FOLATE, FERRITIN, TIBC, IRON, RETICCTPCT in the last 72 hours. Sepsis Labs: Recent Labs  Lab 09/26/20 2140  PROCALCITON 15.92  LATICACIDVEN 1.1     Recent Results (from the past 240 hour(s))  Urine Culture     Status: Abnormal   Collection Time: 09/25/20 10:01 PM   Specimen: Urine, Clean Catch  Result Value Ref Range Status   Specimen Description URINE, CLEAN CATCH  Final   Special Requests   Final    NONE Performed at [facility], [place] [address] [place], [place] [address]    Culture MULTIPLE SPECIES PRESENT, SUGGEST RECOLLECTION (A)  Final   Report Status 09/27/2020 FINAL  Final  Resp Panel by RT-PCR (Flu A&B, Covid) Nasopharyngeal Swab     Status: None   Collection Time: 09/25/20 10:09 PM   Specimen: Nasopharyngeal Swab; Nasopharyngeal(NP) swabs in vial transport medium  Result Value Ref Range Status   SARS Coronavirus 2 by RT PCR NEGATIVE NEGATIVE Final     Comment: (NOTE) SARS-CoV-2 target nucleic acids are NOT DETECTED.  The SARS-CoV-2 RNA is generally detectable in upper respiratory specimens during the acute phase of infection. The lowest concentration of SARS-CoV-2 viral copies this assay can detect is 138 copies/mL. A negative result does not preclude SARS-Cov-2 infection and should not be used as the sole basis for treatment or other patient management decisions. A negative result may occur with  improper specimen collection/handling, submission of specimen other than nasopharyngeal swab, presence of viral mutation(s) within the areas targeted by this assay, and inadequate number of viral copies(<138 copies/mL). A negative result must be combined with clinical observations, patient history, and epidemiological information. The expected result is Negative.  Fact Sheet for Patients:  EntrepreneurPulse.com.au  Fact Sheet for Healthcare Providers:  IncredibleEmployment.be  This test is no t yet approved or cleared by the [place] FDA and  has been authorized for detection and/or diagnosis of SARS-CoV-2 by FDA under an Emergency Use Authorization (EUA). This EUA will remain  in effect (meaning this test can be used) for the duration of the COVID-19 declaration under Section 564(b)(1) of the Act, 21 U.S.C.section 360bbb-3(b)(1), unless the authorization is terminated  or revoked sooner.       Influenza A by PCR NEGATIVE NEGATIVE Final   Influenza B by PCR NEGATIVE NEGATIVE Final    Comment: (NOTE) The Xpert Xpress SARS-CoV-2/FLU/RSV plus assay is intended as an aid in the diagnosis of influenza from Nasopharyngeal swab specimens and should not be used as a sole basis for treatment. Nasal washings and aspirates are unacceptable for Xpert Xpress SARS-CoV-2/FLU/RSV testing.  Fact Sheet for Patients: EntrepreneurPulse.com.au  Fact Sheet for Healthcare  Providers: IncredibleEmployment.be  This test is not yet approved or cleared by the [place] FDA and has been authorized for detection and/or diagnosis of SARS-CoV-2 by FDA under an Emergency Use Authorization (EUA). This EUA will remain in effect (meaning this test can be used) for the duration of the COVID-19 declaration under Section 564(b)(1) of the Act, 21 U.S.C. section 360bbb-3(b)(1), unless the authorization is terminated or revoked.  Performed at [facility], [place] [address], [place], [place] [address]   Culture, blood (  routine x 2)     Status: None (Preliminary result)   Collection Time: 09/26/20  9:40 PM   Specimen: BLOOD  Result Value Ref Range Status   Specimen Description BLOOD SITE NOT SPECIFIED  Final   Special Requests   Final    BOTTLES DRAWN AEROBIC AND ANAEROBIC Blood Culture adequate volume   Culture   Final    NO GROWTH < 24 HOURS Performed at [facility], [place] [address], [place], [place] [address]    Report Status PENDING  Incomplete  Culture, blood (routine x 2)     Status: None (Preliminary result)   Collection Time: 09/26/20  9:46 PM   Specimen: BLOOD  Result Value Ref Range Status   Specimen Description BLOOD LEFT ANTECUBITAL  Final   Special Requests   Final    BOTTLES DRAWN AEROBIC AND ANAEROBIC Blood Culture adequate volume   Culture   Final    NO GROWTH < 24 HOURS Performed at [facility], [place] [address] [place], [place] [address]    Report Status PENDING  Incomplete  Body fluid culture w Gram Stain     Status: None (Preliminary result)   Collection Time: 09/27/20 11:30 AM   Specimen: Peritoneal Washings; Peritoneal Fluid  Result Value Ref Range Status   Specimen Description PERITONEAL FLUID  Final   Special Requests NONE  Final   Gram Stain   Final    ABUNDANT WBC PRESENT, PREDOMINANTLY PMN NO ORGANISMS SEEN    Culture   Final    CULTURE REINCUBATED FOR BETTER GROWTH Performed at [facility], [address], [place], [place] [address]    Report Status PENDING  Incomplete          Radiology Studies: CT CHEST W CONTRAST  Result Date: 09/27/2020 CLINICAL DATA:  Cancer of unknown origin. EXAM: CT CHEST WITH CONTRAST TECHNIQUE: Multidetector CT imaging of the chest was performed during intravenous contrast administration. CONTRAST:  80m OMNIPAQUE IOHEXOL 300 MG/ML  SOLN COMPARISON:  September 26, 2020.  September 25, 2020. FINDINGS: Cardiovascular: No significant vascular findings. Normal heart size. No pericardial effusion. Mediastinum/Nodes: Thyroid gland is unremarkable. Possible 2 cm subcarinal lymph node is noted. Wall thickening of distal esophagus is noted concerning for esophagitis. Lungs/Pleura: No pneumothorax is noted. Small left pleural effusion is noted. Mild bilateral posterior basilar subsegmental atelectasis or infiltrates are noted. Upper Abdomen: As noted on prior CT, hepatic masses are noted concerning for malignancy or metastatic disease. Musculoskeletal: No chest wall abnormality. No acute or significant osseous findings. IMPRESSION: Small left pleural effusion is noted. Mild bilateral posterior basilar subsegmental atelectasis or infiltrates are noted. Possible 2 cm subcarinal lymph node is noted which may represent metastatic disease. Wall thickening of distal esophagus is noted concerning for esophagitis. Endoscopy is recommended for further evaluation. Hepatic masses are again noted as described on prior CT scan, concerning for malignancy or metastatic disease. Electronically Signed   By: JMarijo ConceptionM.D.   On: 09/27/2020 20:01   [place]orea[place]acentesis  Result Date: 09/27/2020 INDICATION: 50year old with indeterminate liver lesions and scheduled for ultrasound-guided liver aspiration or biopsy. EXAM: ULTRASOUND-GUIDED PARACENTESIS COMPARISON:  None. MEDICATIONS: Versed 1.5 mg, fentanyl 50 mcg. Patient was monitored by radiology nurse throughout the procedure. Sedation  time: 19 minutes COMPLICATIONS: None immediate. TECHNIQUE: The procedure was explained to the patient. The risks and benefits of the procedure were discussed and the patient's questions were addressed. Informed consent was obtained from the patient. The  abdomen was evaluated with ultrasound and perihepatic ascites was identified. Consent was also obtained for a paracentesis. Decision was made to perform paracentesis prior to biopsy. The anterior abdomen was prepped with chlorhexidine and sterile field was created. Skin was anesthetized using 1% lidocaine. A 19 gauge Yueh catheter was directed into the left upper abdominal fluid collection using ultrasound guidance. Opaque brown fluid was aspirated. Approximately 100 mL of fluid was removed. Based on the appearance of the fluid, liver biopsy was not performed at this time. Fluid was sent for culture and cytology. Bandage placed over the puncture site. FINDINGS: Small amount of perihepatic ascites along the left side of the abdomen. 100 mL of opaque brown fluid was removed. The lesions in the caudate and posterior left hepatic lobe were identified. The caudate lesion is more conspicuous. The posterior left hepatic lobe lesion is a subtle heterogeneous lesion. IMPRESSION: Successful ultrasound-guided paracentesis yielding 100 mL brown fluid from the left upper abdomen. Ultrasound-guided liver biopsy was not performed at this time. Biopsy to the posterior left hepatic lobe appears to be feasible if needed in the future. Electronically Signed   By: Markus Daft M.D.   On: 09/27/2020 15:35        Scheduled Meds:  [MAR Hold] feeding supplement  237 mL Oral TID BM   [MAR Hold] mesalamine  1.2 g Oral BID   [MAR Hold] multivitamin with minerals  1 tablet Oral Daily   Continuous Infusions:  0.9 % NaCl with KCl 40 mEq / L 50 mL/hr at 09/28/20 1909   [MAR Hold] ferric gluconate (FERRLECIT) IVPB Stopped (09/28/20 1517)   [MAR Hold] piperacillin-tazobactam (ZOSYN)  IV  3.375 g (09/29/20 3614)          Aline August, MD [facility] 09/29/2020, 9:22 AM

## 2020-09-29 NOTE — Transfer of Care (Signed)
Immediate Anesthesia Transfer of Care Note  Patient: Nicole Browning  Procedure(s) Performed: ESOPHAGOGASTRODUODENOSCOPY (EGD) WITH PROPOFOL BIOPSY  Patient Location: PACU  Anesthesia Type:MAC  Level of Consciousness: awake, alert  and oriented  Airway & Oxygen Therapy: Patient Spontanous Breathing  Post-op Assessment: Report given to RN and Post -op Vital signs reviewed and stable  Post vital signs: Reviewed and stable  Last Vitals:  Vitals Value Taken Time  BP 117/82 09/29/20 1022  Temp    Pulse 112 09/29/20 1022  Resp 31 09/29/20 1022  SpO2 100 % 09/29/20 1022  Vitals shown include unvalidated device data.  Last Pain:  Vitals:   09/29/20 0912  TempSrc: Oral  PainSc: 8       Patients Stated Pain Goal: 0 (26/37/85 8850)  Complications: No notable events documented.

## 2020-09-29 NOTE — Op Note (Signed)
[facility] Patient Name: Nicole Browning Procedure Date : 09/29/2020 MRN: [id number] Attending MD: Jackquline Denmark , MD Date of Birth: Aug 26, 1970 CSN: [id number] Age: 50 Admit Type: Inpatient Procedure:                Upper GI endoscopy Indications:              Abnormal CT of the GI tract showing gastric and                            esophageal wall thickening. Providers:                Jackquline Denmark, MD, Clyde Lundborg, RN, Cherylynn Ridges,                            Technician, Clearnce Sorrel, CRNA Referring MD:              Medicines:                Monitored Anesthesia Care Complications:            No immediate complications. Estimated Blood Loss:     Estimated blood loss: none. Procedure:                Pre-Anesthesia Assessment:                           - Prior to the procedure, a History and Physical                            was performed, and patient medications and                            allergies were reviewed. The patient's tolerance of                            previous anesthesia was also reviewed. The risks                            and benefits of the procedure and the sedation                            options and risks were discussed with the patient.                            All questions were answered, and informed consent                            was obtained. Prior Anticoagulants: The patient has                            taken no previous anticoagulant or antiplatelet                            agents. ASA Grade Assessment: III - A patient with  severe systemic disease. After reviewing the risks                            and benefits, the patient was deemed in                            satisfactory condition to undergo the procedure.                           After obtaining informed consent, the endoscope was                            passed under direct vision. Throughout the                             procedure, the patient's blood pressure, pulse, and                            oxygen saturations were monitored continuously. The                            [id number]-[id number] ([id number]) Olympus endoscope was introduced                            through the mouth, and advanced to the second part                            of duodenum. The upper GI endoscopy was                            accomplished without difficulty. The patient                            tolerated the procedure well. Scope In: Scope Out: Findings:      The examined esophagus was normal with well-defined Z-line at 36 cm,       examined by NBI.      A small transient hiatal hernia was present.      Localized mild inflammation characterized by erosions was found in the       gastric antrum. Biopsies were taken with a cold forceps for histology.      The examined duodenum was normal.      No masses in the upper GI tract. Impression:               - Minimal hiatal hernia.                           - Erosive gastritis. Recommendation:           - Return patient to hospital ward for ongoing care.                           - Resume previous diet.                           - Continue present medications.                           -  Await pathology results.                           - Would proceed with CT Abdo/pelvis with p.o. and                            IV contrast in AM. If still abn, would need IR                            guided biopsy. For now we will continue Zosyn.                           - The findings and recommendations were discussed                            with the patient. Procedure Code(s):        --- Professional ---                           [phone], Esophagogastroduodenoscopy, flexible,                            transoral; with biopsy, single or multiple Diagnosis Code(s):        --- Professional ---                           K44.9, Diaphragmatic hernia without obstruction or                             gangrene                           K29.70, Gastritis, unspecified, without bleeding                           R93.3, Abnormal findings on diagnostic imaging of                            other parts of digestive tract CPT copyright 2019 [employer]. All rights reserved. The codes documented in this report are preliminary and upon coder review may  be revised to meet current compliance requirements. Jackquline Denmark, MD 09/29/2020 10:20:56 AM This report has been signed electronically. Number of Addenda: 0

## 2020-09-29 NOTE — Interval H&P Note (Signed)
History and Physical Interval Note:  09/29/2020 9:18 AM  Nicole Browning  has presented today for surgery, with the diagnosis of abdominal pain.  esophagitis per CT chest.  The various methods of treatment have been discussed with the patient and family. After consideration of risks, benefits and other options for treatment, the patient has consented to  Procedure(s): ESOPHAGOGASTRODUODENOSCOPY (EGD) WITH PROPOFOL (N/A) as a surgical intervention.  The patient's history has been reviewed, patient examined, no change in status, stable for surgery.  I have reviewed the patient's chart and labs.  Questions were answered to the patient's satisfaction.     Jackquline Denmark

## 2020-09-29 NOTE — Plan of Care (Signed)
  Problem: Pain Managment: Goal: General experience of comfort will improve Outcome: Progressing   Problem: Nutrition: Goal: Adequate nutrition will be maintained Outcome: Not Progressing

## 2020-09-30 ENCOUNTER — Inpatient Hospital Stay (HOSPITAL_COMMUNITY): Payer: Medicaid Other

## 2020-09-30 DIAGNOSIS — N179 Acute kidney failure, unspecified: Secondary | ICD-10-CM | POA: Diagnosis not present

## 2020-09-30 DIAGNOSIS — R160 Hepatomegaly, not elsewhere classified: Secondary | ICD-10-CM | POA: Diagnosis not present

## 2020-09-30 DIAGNOSIS — E43 Unspecified severe protein-calorie malnutrition: Secondary | ICD-10-CM | POA: Diagnosis not present

## 2020-09-30 DIAGNOSIS — K6389 Other specified diseases of intestine: Secondary | ICD-10-CM | POA: Diagnosis not present

## 2020-09-30 LAB — COMPREHENSIVE METABOLIC PANEL
ALT: 63 U/L — ABNORMAL HIGH (ref 0–44)
AST: 28 U/L (ref 15–41)
Albumin: 1.6 g/dL — ABNORMAL LOW (ref 3.5–5.0)
Alkaline Phosphatase: 75 U/L (ref 38–126)
Anion gap: 6 (ref 5–15)
BUN: 6 mg/dL (ref 6–20)
CO2: 27 mmol/L (ref 22–32)
Calcium: 8.8 mg/dL — ABNORMAL LOW (ref 8.9–10.3)
Chloride: 101 mmol/L (ref 98–111)
Creatinine, Ser: 0.76 mg/dL (ref 0.44–1.00)
GFR, Estimated: >60 mL/min (ref >60)
Glucose, Bld: 151 mg/dL — ABNORMAL HIGH (ref 70–99)
Potassium: 4.5 mmol/L (ref 3.5–5.1)
Sodium: 134 mmol/L — ABNORMAL LOW (ref 135–145)
Total Bilirubin: 0.5 mg/dL (ref 0.3–1.2)
Total Protein: 5.9 g/dL — ABNORMAL LOW (ref 6.5–8.1)

## 2020-09-30 LAB — CBC WITH DIFFERENTIAL/PLATELET
Abs Immature Granulocytes: 0 10*3/uL (ref 0.00–0.07)
Basophils Absolute: 0 10*3/uL (ref 0.0–0.1)
Basophils Relative: 0 %
Eosinophils Absolute: 0 10*3/uL (ref 0.0–0.5)
Eosinophils Relative: 0 %
HCT: 24.2 % — ABNORMAL LOW (ref 36.0–46.0)
Hemoglobin: 8.1 g/dL — ABNORMAL LOW (ref 12.0–15.0)
Lymphocytes Relative: 12 %
Lymphs Abs: 2.4 10*3/uL (ref 0.7–4.0)
MCH: 27.4 pg (ref 26.0–34.0)
MCHC: 33.5 g/dL (ref 30.0–36.0)
MCV: 81.8 fL (ref 80.0–100.0)
Monocytes Absolute: 1.2 10*3/uL — ABNORMAL HIGH (ref 0.1–1.0)
Monocytes Relative: 6 %
Neutro Abs: 16.4 10*3/uL — ABNORMAL HIGH (ref 1.7–7.7)
Neutrophils Relative %: 82 %
Platelets: 397 10*3/uL (ref 150–400)
RBC: 2.96 MIL/uL — ABNORMAL LOW (ref 3.87–5.11)
RDW: 16.1 % — ABNORMAL HIGH (ref 11.5–15.5)
WBC: 20 10*3/uL — ABNORMAL HIGH (ref 4.0–10.5)
nRBC: 0 /100 WBC
nRBC: 0.3 % — ABNORMAL HIGH (ref 0.0–0.2)

## 2020-09-30 LAB — MAGNESIUM: Magnesium: 1.7 mg/dL (ref 1.7–2.4)

## 2020-09-30 LAB — C-REACTIVE PROTEIN: CRP: 24.8 mg/dL — ABNORMAL HIGH (ref <1.0)

## 2020-09-30 IMAGING — CT CT ABD-PELV W/ CM
2 of 5 series · 15 of 46 positions shown, 17 images · IV contrast (Omni 300)
Comparison: Abdominopelvic CT of [DATE]. Pelvic abdominopelvic
MRI [DATE].

CLINICAL DATA: Chronic abdominal pain. Prior CT suspicious for
sigmoid neoplasm. History of Crohn disease.

EXAM:
CT ABDOMEN AND PELVIS WITH CONTRAST
TECHNIQUE: Multidetector CT imaging of the abdomen and pelvis was performed
using the standard protocol following bolus administration of
intravenous contrast.
CONTRAST:  100mL OMNIPAQUE IOHEXOL 300 MG/ML  SOLN

[Series 3: a/p w/ 5mm · axial · 0.66mm/px · z∈[-1138,-698]mm · 12 of 100 slices shown, 14 images]
[im 6/100  soft-tissue]
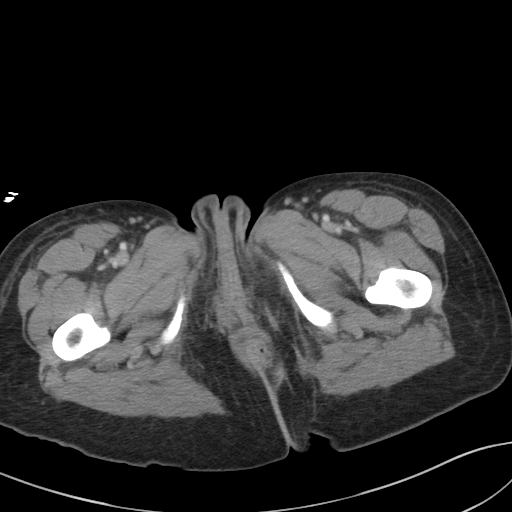
[im 6/100  bone]
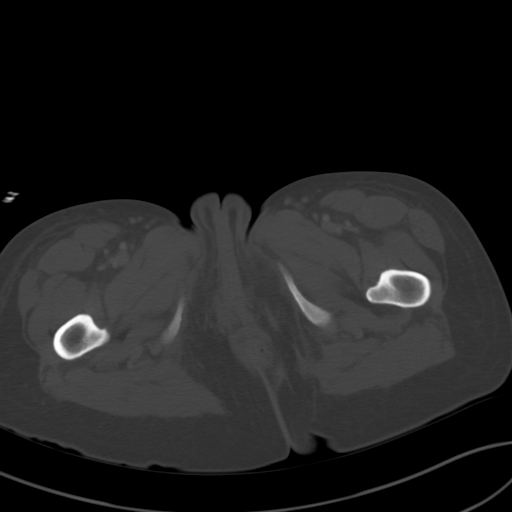
[im 16/100  soft-tissue]
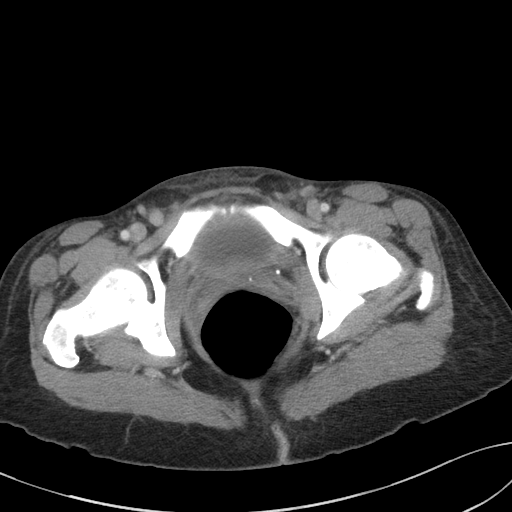
[im 21/100  soft-tissue]
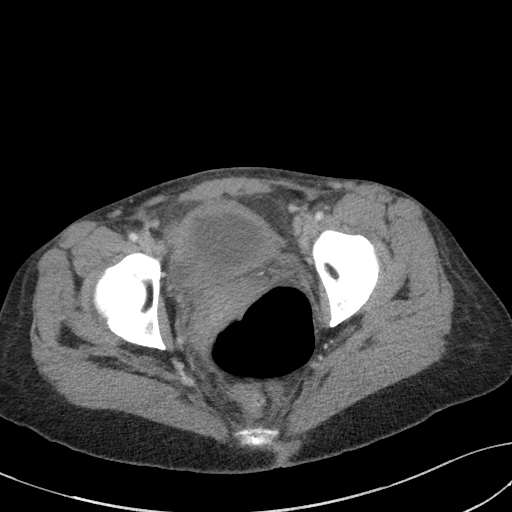
[im 32/100  soft-tissue]
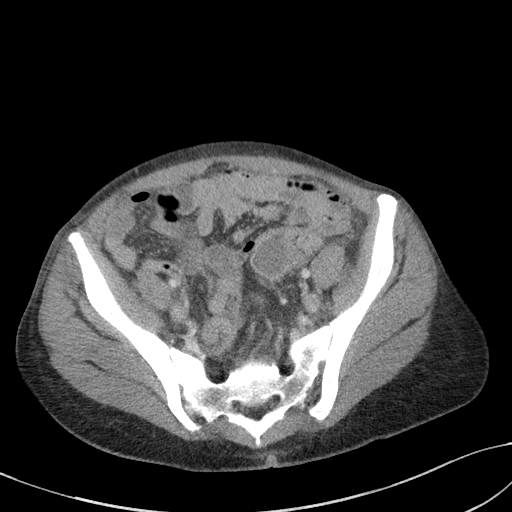
[im 37/100  soft-tissue]
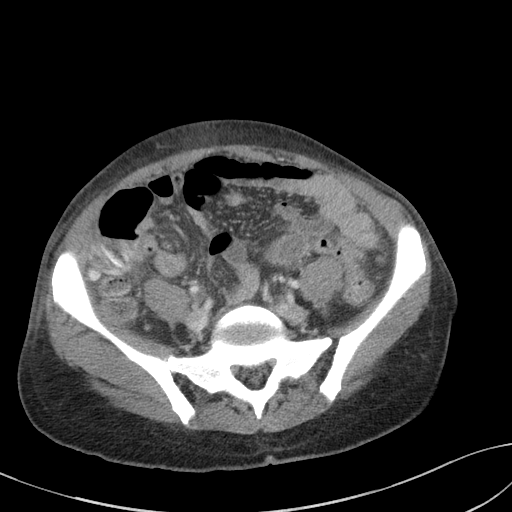
[im 47/100  soft-tissue]
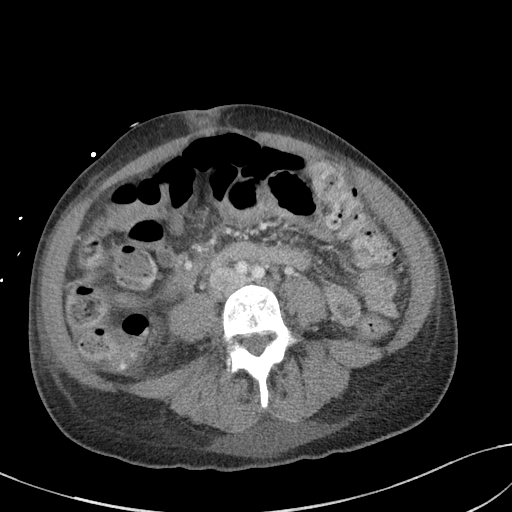
[im 53/100  soft-tissue]
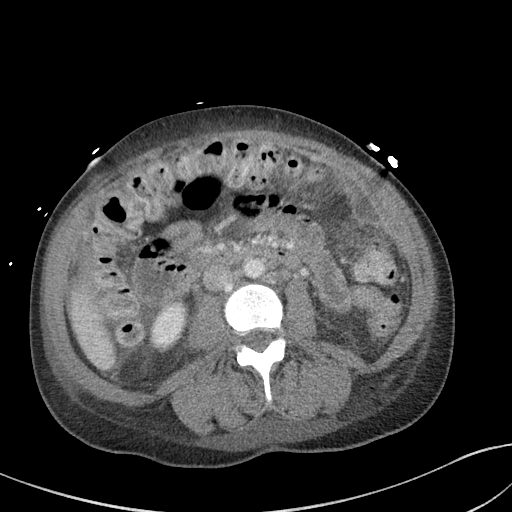
[im 63/100  soft-tissue]
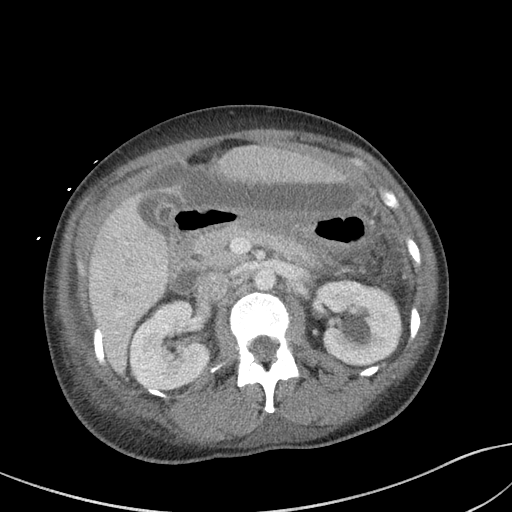
[im 68/100  soft-tissue]
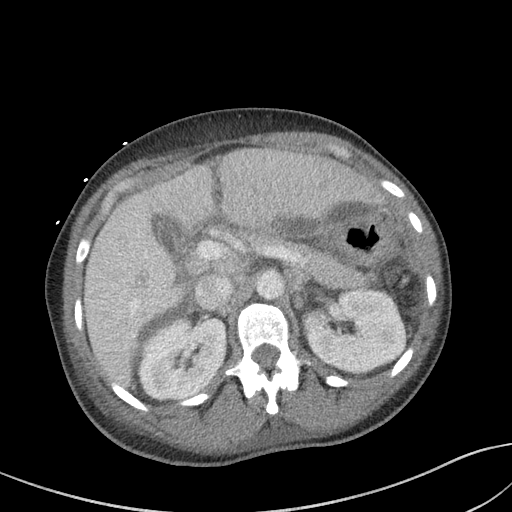
[im 68/100  bone]
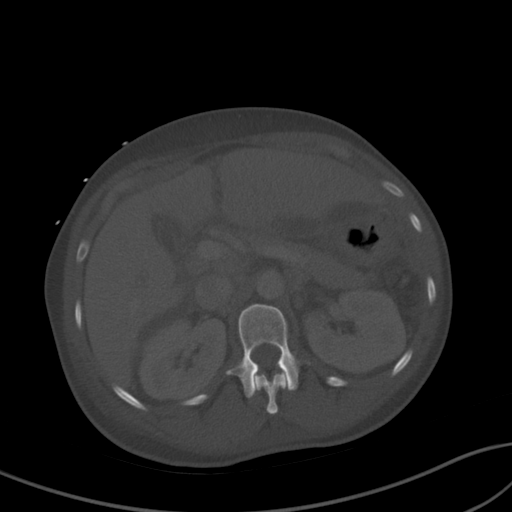
[im 79/100  soft-tissue]
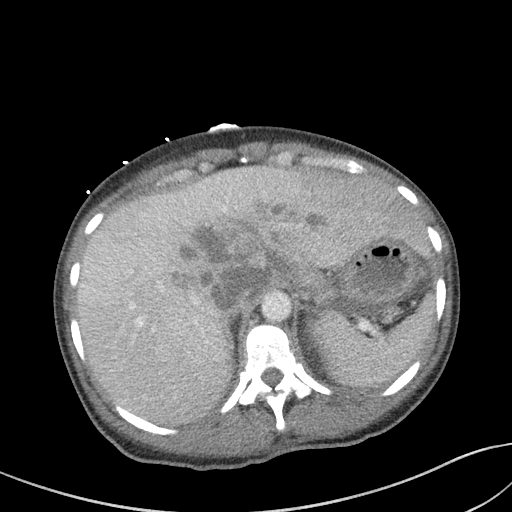
[im 84/100  soft-tissue]
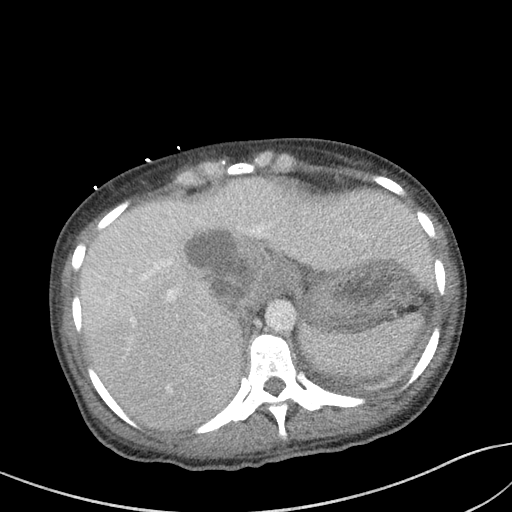
[im 94/100  soft-tissue]
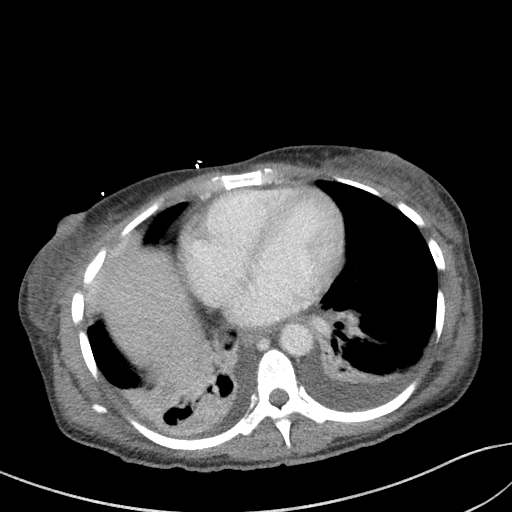

[Series 6: a/p w/ cor · coronal · 0.73mm/px · 3 of 124 slices shown]
[im 42/124  soft-tissue]
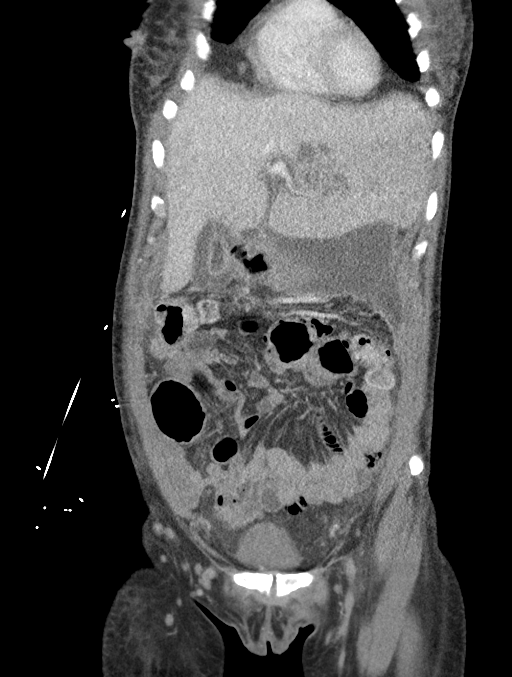
[im 55/124  soft-tissue]
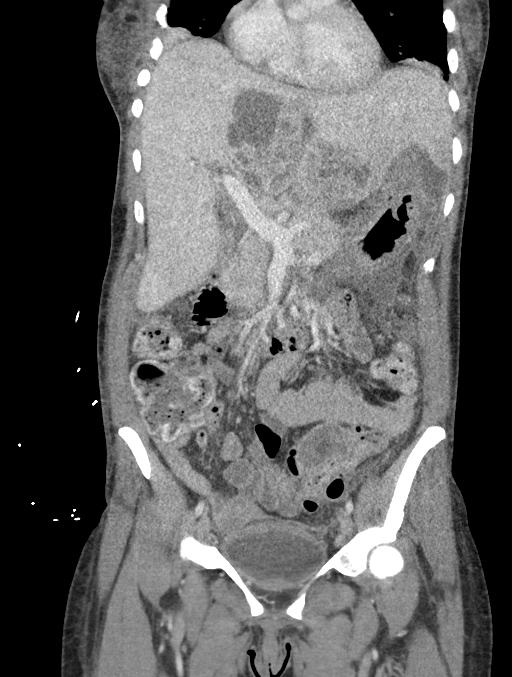
[im 69/124  soft-tissue]
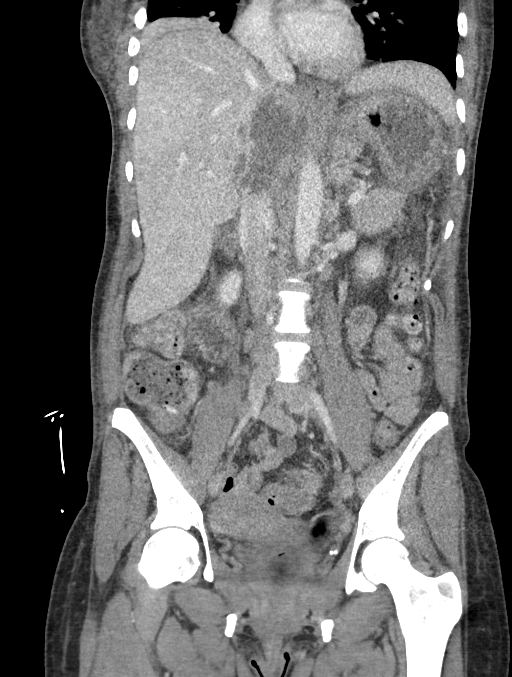

[15 of 46 positions shown; findings below may reference images not displayed]

FINDINGS: Lower chest: Worsened bibasilar aeration with development of right
greater than left base airspace disease. Normal heart size with new
tiny bilateral pleural effusions.

Hepatobiliary: Capsular based masses within the liver may be
parenchymal or peritoneal. Example within the caudate lobe at 6.3 x
6.1 cm on [DATE] and the posterior aspect of segment 2-3 at 6.0 x
cm on [DATE].

Gallbladder wall thickening is nonspecific. No calcified stone or
biliary duct dilatation.

Pancreas: Normal, without mass or ductal dilatation.

Spleen: Normal in size, without focal abnormality.

Adrenals/Urinary Tract: Normal adrenal glands. Left renal sinus cyst
an interpolar too small to characterize lesion. Normal right kidney.
Normal urinary bladder.

Stomach/Bowel: Portions of the stomach are decompressed and
therefore likely secondarily appear thick walled.The sigmoid is
moderately thick walled, including on 71/3. Normal terminal ileum
and appendix. Normal small bowel caliber. No complicating
obstruction.

Vascular/Lymphatic: Aortic atherosclerosis. No abdominopelvic
adenopathy. Porta hepatis nodes of up to 10 mm are not pathologic by
size criteria.

Reproductive: Normal uterus and adnexa.

Other: Development of small volume ascites with loculated fluid
collections since [DATE]. Example collection with peritoneal
thickening and hyperenhancement within the anterior abdomen at [DATE]
x 4.3 cm on 42/3.

A complex fluid and gas collection cephalad to the thickened sigmoid
measures 3.7 x 2.8 cm on 67/3 and is similar to [DATE].

Inferior to this thickened sigmoid is a fluid collection as detailed
on recent MRI with minimal gas within. Example 3.1 x 2.4 cm on 74/3.
No free intraperitoneal air. Presumably iatrogenic nodularity about
the anterior abdominal wall.

Musculoskeletal: Right sacroiliac joint sclerosis suggest
sacroiliitis.
IMPRESSION: 1. Findings which are again suspicious for metastatic disease to the
peritoneum and possibly hepatic parenchyma. Dominant capsular based
liver masses have been detailed previously. A new loculated fluid
collection within the anterior abdomen with peritoneal
thickening/enhancement. Differential considerations include infected
ascites/developing abscess or peritoneal carcinomatosis. Correlate
with paracentesis results of 1 day prior.
2. Sigmoid wall thickening which could represent the site of primary
neoplasm. Pericolonic complex fluid collections could represent
developing abscesses, and were detailed on the prior pelvic MRI.
3. No bowel obstruction or other acute complication.
4. New small bilateral pleural effusions with adjacent atelectasis
or infection.
5. Apparent gastric wall thickening, at least partially felt to be
due to underdistention.

## 2020-09-30 MED ORDER — IOHEXOL 300 MG/ML  SOLN
100.0000 mL | Freq: Once | INTRAMUSCULAR | Status: AC | PRN
  Administered 2020-09-30: 100 mL via INTRAVENOUS

## 2020-09-30 MED ORDER — SODIUM CHLORIDE 0.9 % IV SOLN
INTRAVENOUS | Status: DC | PRN
  Administered 2020-09-30 – 2020-10-03 (×2): 250 mL via INTRAVENOUS

## 2020-09-30 NOTE — Consult Note (Signed)
Chief Complaint: Abdominal pain, nausea vomiting. Found to have sigmoid mass with liver lesions. Team is requested hepatic biopsy. Per note from Dr. Anselm Pancoast posterior left hepatic lobe  Referring Physician(s): Dr. Cyndi Bender   Supervising Physician: Daryll Brod  Patient Status: [facility] - In-pt  History of Present Illness: Nicole Browning is a 50 y.o. female History of inflammatory bowel disease, GERD, anemia. Presented to the ED on 7.26.22 with abdominal pain, nausea and vomiting. Found to be hypotensive in AKI with elevated liver enzymes. CT showed a sigmoid mass with liver lesions. CT and pelvis from 7.26.22 reads Hepatobiliary: Diffuse central low attenuation in the liver spanning medial an lateral segment of LEFT hepatic lobe along the fissure for false form ligament measuring up to 9.3 x 4.3 cm in total. This appears either anterior 2 or also involving intrahepatic inferior vena cava tracking towards the confluence of the hepatic veins. No pericholecystic stranding. No additional lesions visualized. MR abd performed on 7.27.22 reads Two similar indeterminate heterogeneous liver masses measuring 5.6 cm in the caudate lobe and 5.3 cm in the posterior left liver, incompletely evaluated. Metastatic disease not excluded. Consider IR consultation for potential ultrasound-guided liver biopsy. If clinically feasible, liver protocol CT abdomen and pelvis without and with IV contrast may be considered for further evaluation prior to potential biopsy. IR performed at paracentesis on 7.27.22. Cytology at that time showed no malignancy. Per noted from IR Attending Dr. Anselm Pancoast who performed the procedure. Biopsy of the posterior left hepatic lobe appears to be feasible if needed in the future. Patient had an EGD perform on 7.30.22 for esophagitis with biopsy.  Results pending. Team is requesting hepatic biopsy.   Currently without any significant complaints. Patient alert and laying in bed with a sleepy affect.  Patient endorses abdominal pain but state it has improved since her admission. She Denies any fevers, headache, chest pain, SOB, cough, nausea, vomiting or bleeding. Return precautions and treatment recommendations and follow-up discussed with the patient who is agreeable with the plan.  Past Medical History:  Diagnosis Date   Anemia    GERD (gastroesophageal reflux disease)    IBS (irritable bowel syndrome)    Perirectal abscess    Protein-calorie malnutrition ([place]) 02/29/2016   Tobacco use    UC (ulcerative colitis) ([place])     Past Surgical History:  Procedure Laterality Date   IRRIGATION AND DEBRIDEMENT ABSCESS N/A 02/24/2016   Procedure: IRRIGATION AND DEBRIDEMENT ABSCESS;  Surgeon: Stark Klein, MD;  Location: [place] [facility];  Service: General;  Laterality: N/A;    Allergies: Bactrim [sulfamethoxazole-trimethoprim], Percocet [oxycodone-acetaminophen], and Valium [diazepam]  Medications: Prior to Admission medications   Medication Sig Start Date End Date Taking? Authorizing Provider  aspirin EC 325 MG tablet Take 325 mg by mouth every 6 (six) hours as needed for moderate pain.   Yes [provider]  bismuth subsalicylate (PEPTO BISMOL) 262 MG/15 ML suspension Take 30 mLs by mouth every 6 (six) hours as needed for indigestion.   Yes [provider]  ibuprofen (ADVIL) 200 MG tablet Take 200 mg by mouth every 6 (six) hours as needed for moderate pain.   Yes [provider]  mesalamine (LIALDA) 1.2 g EC tablet Take 1 tablet (1.2 g total) by mouth in the morning and at bedtime. 07/11/20  Yes Noralyn Pick, NP  Multiple Vitamins-Minerals (WOMENS MULTIVITAMIN PO) Take 1 tablet by mouth daily.   Yes [provider]  naproxen sodium (ALEVE) 220 MG tablet Take 220 mg by mouth  daily as needed (pain).   Yes [provider]  POTASSIUM PO Take 1 tablet by mouth daily.   Yes [provider]  Vitamin D, Ergocalciferol, (DRISDOL) 1.25 MG (50000  UNIT) CAPS capsule Take 1 capsule (50,000 Units total) by mouth every 7 (seven) days. TAKE ONE CAPSULE BY MOUTH ONCE WEEKLY 07/22/20  Yes Noralyn Pick, NP  VITAMIN E PO Take 1 tablet by mouth daily.   Yes [provider]  doxycycline (VIBRAMYCIN) 100 MG capsule Take 1 capsule (100 mg total) by mouth 2 (two) times daily. Patient not taking: Reported on 09/25/2020 07/09/20   Scot Jun, FNP     Family History  Problem Relation Age of Onset   Diabetes Mother    COPD Father    Leukemia Brother    Colon cancer Neg Hx    Colon polyps Neg Hx    Esophageal cancer Neg Hx    Rectal cancer Neg Hx    Stomach cancer Neg Hx     Social History   Socioeconomic History   Marital status: Single    Spouse name: Not on file   Number of children: Not on file   Years of education: Not on file   Highest education level: Not on file  Occupational History   Not on file  Tobacco Use   Smoking status: Every Day    Packs/day: 0.33    Years: 28.00    Pack years: 9.24    Types: Cigarettes   Smokeless tobacco: Never  Vaping Use   Vaping Use: Never used  Substance and Sexual Activity   Alcohol use: Yes    Alcohol/week: 2.0 standard drinks    Types: 2 Glasses of wine per week   Drug use: No   Sexual activity: Not Currently    Birth control/protection: Injection  Other Topics Concern   Not on file  Social History Narrative   Not on file   Social Determinants of Health   Financial Resource Strain: Not on file  Food Insecurity: Not on file  Transportation Needs: Not on file  Physical Activity: Not on file  Stress: Not on file  Social Connections: Not on file    Review of Systems: A 12 point ROS discussed and pertinent positives are indicated in the HPI above.  All other systems are negative.  Review of Systems  Constitutional:  Negative for fatigue and fever.  HENT:  Negative for congestion.   Respiratory:  Negative for cough and shortness of breath.    Gastrointestinal:  Positive for abdominal pain. Negative for diarrhea, nausea and vomiting.   Vital Signs: BP 112/76 (BP Location: Left Arm)   Pulse 100   Temp 98.9 F (37.2 C) (Oral)   Resp 16   Ht 5' 4"  (1.626 m)   Wt 125 lb (56.7 kg)   LMP  (LMP Unknown)   SpO2 96%   BMI 21.46 kg/m   Physical Exam Vitals and nursing note reviewed.  Constitutional:      Appearance: She is well-developed.  HENT:     Head: Normocephalic and atraumatic.  Eyes:     Conjunctiva/sclera: Conjunctivae normal.  Cardiovascular:     Rate and Rhythm: Normal rate and regular rhythm.  Pulmonary:     Effort: Pulmonary effort is normal.     Breath sounds: Normal breath sounds.  Musculoskeletal:        General: Normal range of motion.     Cervical back: Normal range of motion.  Skin:  General: Skin is warm.  Neurological:     Mental Status: She is alert and oriented to person, place, and time.    Imaging: CT ABDOMEN PELVIS WO CONTRAST  Result Date: 09/25/2020 CLINICAL DATA:  Abdominal pain, acute nonlocalized abdominal pain associated with muscle spasms. EXAM: CT ABDOMEN AND PELVIS WITHOUT CONTRAST TECHNIQUE: Multidetector CT imaging of the abdomen and pelvis was performed following the standard protocol without IV contrast. COMPARISON:  February 24, 2016 FINDINGS: Lower chest: Basilar atelectasis/early consolidation on the RIGHT. No effusion. Hepatobiliary: Diffuse central low attenuation in the liver spanning medial an lateral segment of LEFT hepatic lobe along the fissure for false form ligament measuring up to 9.3 x 4.3 cm in total. This appears either anterior 2 or also involving intrahepatic inferior vena cava tracking towards the confluence of the hepatic veins. No pericholecystic stranding. No additional lesions visualized. Pancreas: Not well visualized. Grossly unremarkable and without signs of inflammation. Spleen: Spleen normal size and contour though with serosal disease suspected along the  anterior margin. See below. Adrenals/Urinary Tract: Adrenal glands are normal. Kidneys with smooth contours. No hydronephrosis. Urinary bladder is collapsed. Stomach/Bowel: Gastric thickening is suggested in the area of the gastric antrum with some perigastric stranding. Mild distension of small bowel loops in the abdomen without signs of gross obstruction. Colonic loops decompressed distal to the transverse colon. Suggestion of low-attenuation focus along the margin of the sigmoid (image 67/3) this is suspicious for serosal implant with an adjacent soft tissue nodule in the sigmoid mesentery (image 66/6) 2.3 x 1.5 cm. Mid sigmoid abnormality as discussed below. Visualized portions of the appendix are normal. Vascular/Lymphatic: Calcified atheromatous plaque, scattered throughout the normal caliber abdominal aorta. Smooth contour of the IVC. Scattered small lymph nodes in the retroperitoneum. None with pathologic enlargement. No gross adenopathy in the abdomen though with ill-defined soft tissue in the porta hepatis that may represent conglomerate adenopathy the largest potentially 13 mm short axis (image 28/3) Scattered lymph nodes adjacent to rectosigmoid colon best seen on coronal image 76, adjacent to either soft tissue implant or large lymph node. Adjacent to the dominant soft tissue nodule adjacent to the colon there is question of colonic thickening from which this area arises. This is at the level of the mid sigmoid colon. Ovaries along the bilateral pelvic sidewalls. Reproductive: Ovaries along the bilateral pelvic sidewalls. No adnexal mass. Uterus unremarkable. Other: While there is no ascites. There is fascial thickening with nodularity, for example on image 28 of series 3 6 mm thickening of the abdominal fascia adjacent to the stomach. Nodularity tracking along the anterior and lateral transversalis and in the lateral conal fascia in the LEFT hemiabdomen. Indistinct appearance of the omental fat raising  the question of omental disease. Musculoskeletal: No acute musculoskeletal findings. Signs of sacroiliitis with asymmetric involvement of the RIGHT sacral iliac joint. No destructive bone finding or acute bone process. IMPRESSION: 1. 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 Hepatic MRI or CT with contrast may be helpful as the patient is able for further evaluation. 2. Question of gastric antral thickening.  Areas not well assessed. 3. Signs of sacroiliitis with asymmetric involvement of the RIGHT sacral iliac joint. Correlate with any clinical or laboratory evidence of seronegative spondyloarthropathy. 4. Juxta diaphragmatic airspace disease in the RIGHT chest favored to represent juxta diaphragmatic atelectasis. 5. Aortic atherosclerosis. Electronically Signed   By: Zetta Bills M.D.   On: 09/25/2020 19:04  CT CHEST W CONTRAST  Result Date: 09/27/2020 CLINICAL DATA:  Cancer of unknown origin. EXAM: CT CHEST WITH CONTRAST TECHNIQUE: Multidetector CT imaging of the chest was performed during intravenous contrast administration. CONTRAST:  37m OMNIPAQUE IOHEXOL 300 MG/ML  SOLN COMPARISON:  September 26, 2020.  September 25, 2020. FINDINGS: Cardiovascular: No significant vascular findings. Normal heart size. No pericardial effusion. Mediastinum/Nodes: Thyroid gland is unremarkable. Possible 2 cm subcarinal lymph node is noted. Wall thickening of distal esophagus is noted concerning for esophagitis. Lungs/Pleura: No pneumothorax is noted. Small left pleural effusion is noted. Mild bilateral posterior basilar subsegmental atelectasis or infiltrates are noted. Upper Abdomen: As noted on prior CT, hepatic masses are noted concerning for malignancy or metastatic disease. Musculoskeletal: No chest wall abnormality. No acute or significant osseous findings. IMPRESSION: Small left pleural effusion is noted. Mild bilateral posterior  basilar subsegmental atelectasis or infiltrates are noted. Possible 2 cm subcarinal lymph node is noted which may represent metastatic disease. Wall thickening of distal esophagus is noted concerning for esophagitis. Endoscopy is recommended for further evaluation. Hepatic masses are again noted as described on prior CT scan, concerning for malignancy or metastatic disease. Electronically Signed   By: JMarijo ConceptionM.D.   On: 09/27/2020 20:01   MR PELVIS WO CONTRAST  Addendum Date: 09/26/2020   ADDENDUM REPORT: 09/26/2020 19:50 ADDENDUM: These results were called by telephone at the time of interpretation on 09/26/2020 at 7:49 pm to provider hospitalist DR. SHALHOUB, who verbally acknowledged these results. Electronically Signed   By: JIlona SorrelM.D.   On: 09/26/2020 19:50   Result Date: 09/26/2020 CLINICAL DATA:  50year old female with acute abdominal pain. Indeterminate perisigmoid masses and liver masses on noncontrast CT from 1 day prior. By report, no sigmoid mass identified on colonoscopy performed in May 2022. EXAM: MRI PELVIS WITHOUT CONTRAST TECHNIQUE: Multiplanar multisequence MR imaging of the pelvis was performed. No intravenous contrast was administered. Study discontinued early at the patient's request, with intravenous contrast not administered and postcontrast sequences not obtained. COMPARISON:  07/24/2020 MRI pelvis.  09/25/2020 CT abdomen/pelvis. FINDINGS: Urinary Tract:  Normal bladder and urethra. Bowel: There is a 3.0 x 2.1 x 2.5 cm mildly T2 hyperintense mass in the posterior left pelvis adherent to the serosa of the sigmoid colon (series 5/image 4), with the suggestion of a tiny amount of nondependent internal gas. There is a separate nearby 2.2 x 1.7 x 1.3 cm T2 hyperintense mass in the sigmoid mesentery (series 5/image 1). There is a separate more anteriorly located thick walled 4.3 x 2.9 x 2.8 cm collection adherent to the sigmoid serosa with air-fluid level (series 3/image 21).  There is mild wall thickening of the mid sigmoid colon. Vascular/Lymphatic: No appreciable acute vascular abnormality on this noncontrast scan. No pathologically enlarged inguinal nodes. Reproductive: Normal size anteverted uterus measuring 6.2 x 3.1 x 3.9 cm. No uterine fibroids. Thin endometrium (2 mm thickness) with no discrete endometrial mass and no endometrial cavity fluid. No discrete mass of the uterine cervix, noting this study is not tailored for evaluation of the uterus. The ovaries appear symmetric and small (series 6/image 3 on the right and image 5 on the left). Other: Mild thickening of the bilateral pelvic peritoneum (series 7/image 13 on the right and image 14 on the left). No pelvic ascites. Musculoskeletal: No aggressive appearing focal osseous lesions. IMPRESSION: 1. Limited noncontrast MRI pelvis study, discontinued early at the patient's request. 2. Thick-walled 4.3 x 2.9 x 2.8 cm collection adherent to the  proximal sigmoid serosa in the left anterior pelvis with air-fluid level. Separate 3.0 x 2.1 x 2.5 cm T2 hyperintense mass in the posterior left pelvis adherent to the sigmoid serosa with suggestion of a tiny amount of nondependent gas. Separate 2.2 cm T2 hyperintense mass in the sigmoid mesentery. These findings are indeterminate, with differential including gas-containing pelvic abscesses versus peritoneal metastatic disease of uncertain primary. Fistulous communication to the sigmoid colon not excluded. Surgical consultation suggested. 3. Mild thickening of the bilateral pelvic peritoneum, nonspecific, differential includes peritonitis vs. peritoneal carcinomatosis. Electronically Signed: By: Ilona Sorrel M.D. On: 09/26/2020 19:35   MR ABDOMEN WO CONTRAST  Addendum Date: 09/26/2020   ADDENDUM REPORT: 09/26/2020 19:49 ADDENDUM: These results were called by telephone at the time of interpretation on 09/26/2020 at 7:46 pm to provider DR. SHALHOUB, who verbally acknowledged these results.  Upon further discussion with Dr. Cyd Silence, the patient is exhibiting clinical findings of sepsis with an elevated WBC count. The differential for the liver masses includes liver abscesses in addition to metastatic disease. Additionally the differential for the peritoneal thickening includes infectious peritonitis in addition to carcinomatosis. IR consultation for consideration of percutaneous biopsy/drainage of these liver lesions is suggested. Electronically Signed   By: Ilona Sorrel M.D.   On: 09/26/2020 19:49   Result Date: 09/26/2020 CLINICAL DATA:  Crohn disease. Indeterminate liver masses on unenhanced CT from earlier today. EXAM: MRI ABDOMEN WITHOUT CONTRAST TECHNIQUE: Multiplanar multisequence MR imaging was performed without the administration of intravenous contrast. Patient unable to tolerate full exam or IV contrast. Only axial and coronal T2 weighted imaging obtained. COMPARISON:  09/25/2020 CT abdomen/pelvis. FINDINGS: Motion degraded incomplete MRI abdomen study performed without IV contrast. Lower chest: Streaky curvilinear opacities at the dependent right greater than left lung bases bilaterally, not well evaluated by MRI. Hepatobiliary: Similar heterogeneous T2 signal intensity liver masses measuring 5.6 x 4.7 cm in the caudate lobe (series 15/image 16) and 5.3 x 4.0 cm in the posterior left liver (series 15/image 19). Normal gallbladder with no cholelithiasis. Mild segmental intrahepatic biliary ductal dilatation in the lateral segment left liver lobe. No significant intrahepatic biliary ductal dilatation in the right liver. Common bile duct diameter 3 mm. No choledocholithiasis. No appreciable biliary masses or strictures. Pancreas: No pancreatic mass or duct dilation.  No pancreas divisum. Spleen: Normal size. No mass. Adrenals/Urinary Tract: Normal adrenals. No hydronephrosis. Simple appearing homogeneous T2 hyperintense left renal cysts, largest 2.3 cm in the lower left kidney. No overtly  suspicious renal masses. Stomach/Bowel: Normal non-distended stomach. Visualized small and large bowel is normal caliber, with no bowel wall thickening. Vascular/Lymphatic: Nonaneurysmal abdominal aorta. No pathologically enlarged lymph nodes in the abdomen. Other: Trace abdominal ascites. Scattered mild generalized peritoneal thickening, for example in the right pericolic gutter (series 11/[facility] 34) and left upper quadrant (series 15/image 25). No focal drainable fluid collection. Musculoskeletal: No aggressive appearing focal osseous lesions. IMPRESSION: 1. Limited motion degraded incomplete MRI abdomen study performed without IV contrast, discontinued prior to study completion at the patient's request. 2. Two similar indeterminate heterogeneous liver masses measuring 5.6 cm in the caudate lobe and 5.3 cm in the posterior left liver, incompletely evaluated. Metastatic disease not excluded. Consider IR consultation for potential ultrasound-guided liver biopsy. If clinically feasible, liver protocol CT abdomen and pelvis without and with IV contrast may be considered for further evaluation prior to potential biopsy. 3. Mild generalized peritoneal thickening. Trace abdominal ascites. These findings are nonspecific but raise concern for peritoneal carcinomatosis, as described on the  noncontrast CT study from 1 day prior. 4. Streaky curvilinear opacities at the dependent right greater than left lung bases, not well evaluated by MRI. Chest CT may be obtained for further evaluation as clinically warranted. Electronically Signed: By: Ilona Sorrel M.D. On: 09/26/2020 18:25   US Paracentesis  Result Date: 09/27/2020 INDICATION: 50 year old with indeterminate liver lesions and scheduled for ultrasound-guided liver aspiration or biopsy. EXAM: ULTRASOUND-GUIDED PARACENTESIS COMPARISON:  None. MEDICATIONS: Versed 1.5 mg, fentanyl 50 mcg. Patient was monitored by radiology nurse throughout the procedure. Sedation time: 19  minutes COMPLICATIONS: None immediate. TECHNIQUE: The procedure was explained to the patient. The risks and benefits of the procedure were discussed and the patient's questions were addressed. Informed consent was obtained from the patient. The abdomen was evaluated with ultrasound and perihepatic ascites was identified. Consent was also obtained for a paracentesis. Decision was made to perform paracentesis prior to biopsy. The anterior abdomen was prepped with chlorhexidine and sterile field was created. Skin was anesthetized using 1% lidocaine. A 19 gauge Yueh catheter was directed into the left upper abdominal fluid collection using ultrasound guidance. Opaque brown fluid was aspirated. Approximately 100 mL of fluid was removed. Based on the appearance of the fluid, liver biopsy was not performed at this time. Fluid was sent for culture and cytology. Bandage placed over the puncture site. FINDINGS: Small amount of perihepatic ascites along the left side of the abdomen. 100 mL of opaque brown fluid was removed. The lesions in the caudate and posterior left hepatic lobe were identified. The caudate lesion is more conspicuous. The posterior left hepatic lobe lesion is a subtle heterogeneous lesion. IMPRESSION: Successful ultrasound-guided paracentesis yielding 100 mL brown fluid from the left upper abdomen. Ultrasound-guided liver biopsy was not performed at this time. Biopsy to the posterior left hepatic lobe appears to be feasible if needed in the future. Electronically Signed   By: Markus Daft M.D.   On: 09/27/2020 15:35    Labs:  CBC: Recent Labs    09/27/20 0343 09/28/20 0348 09/29/20 0224 09/30/20 0239  WBC 10.9* 11.5* 16.7* 20.0*  HGB 8.5* 8.4* 8.2* 8.1*  HCT 25.4* 25.4* 24.4* 24.2*  PLT 283 322 353 397    COAGS: Recent Labs    09/27/20 0725  INR 1.2    BMP: Recent Labs    09/27/20 0343 09/28/20 0348 09/29/20 0224 09/30/20 0239  NA 131* 135 136 134*  K 3.3* 4.0 4.1 4.5  CL 98  101 103 101  CO2 25 28 26 27   GLUCOSE 119* 115* 110* 151*  BUN 10 8 5* 6  CALCIUM 8.3* 8.8* 8.7* 8.8*  CREATININE 0.78 0.75 0.66 0.76  GFRNONAA >60 >60 >60 >60    LIVER FUNCTION TESTS: Recent Labs    09/27/20 0343 09/28/20 0348 09/29/20 0224 09/30/20 0239  BILITOT 0.7 0.8 0.9 0.5  AST 55* 41 35 28  ALT 131* 99* 77* 63*  ALKPHOS 60 73 71 75  PROT 5.8* 5.8* 5.7* 5.9*  ALBUMIN 1.8* 1.7* 1.6* 1.6*      Assessment and Plan:  50 y.o. female inpatient. History of inflammatory bowel disease, GERD, anemia. Presented to the ED on 7.26.22 with abdominal pain, nausea and vomiting. Found to be hypotensive in AKI with elevated liver enzymes. CT showed a sigmoid mass with liver lesions. CT and pelvis from 7.26.22 reads Hepatobiliary: Diffuse central low attenuation in the liver spanning medial an lateral segment of LEFT hepatic lobe along the fissure for false form ligament measuring up to  9.3 x 4.3 cm in total. This appears either anterior 2 or also involving intrahepatic inferior vena cava tracking towards the confluence of the hepatic veins. No pericholecystic stranding. No additional lesions visualized. MR abd performed on 7.27.22 reads Two similar indeterminate heterogeneous liver masses measuring 5.6 cm in the caudate lobe and 5.3 cm in the posterior left liver, incompletely evaluated. Metastatic disease not excluded. Consider IR consultation for potential ultrasound-guided liver biopsy. If clinically feasible, liver protocol CT abdomen and pelvis without and with IV contrast may be considered for further evaluation prior to potential biopsy. IR performed at paracentesis on 7.27.22. Cytology at that time showed no malignancy. Per noted from IR Attending Dr. Anselm Pancoast who performed the procedure. Biopsy of the posterior left hepatic lobe appears to be feasible if needed in the future. Patient had an EGD perform on 7.30.22 for esophagitis with biopsy.  Results pending. Team is requesting hepatic biopsy.    WBC 20 AST 28, ALT 63, Total Protein 5.9 AKI. All medications are within acceptable parameters. Allergies include Bactrim, Percocet and valium.   IR consulted for possible hepatic biopsy. Case has been reviewed and procedure approved by Dr. Annamaria Boots.  Patient tentatively scheduled for 8.1.22.  Team instructed to:  Keep Patient to be NPO after midnight  IR will call patient when ready.   Risks and benefits of hepatic biopsy was discussed with the patient and/or patient's family including, but not limited to bleeding, infection, damage to adjacent structures or low yield requiring additional tests.  All of the questions were answered and there is agreement to proceed.  Consent signed and in chart.   Thank you for this interesting consult.  I greatly enjoyed meeting Angala Hilgers and look forward to participating in their care.  A copy of this report was sent to the requesting provider on this date.  Electronically Signed: Jacqualine Mau, NP 09/30/2020, 8:40 AM   I spent a total of 40 Minutes   in face to face in clinical consultation, greater than 50% of which was counseling/coordinating care for hepatic biopsy

## 2020-09-30 NOTE — Progress Notes (Signed)
Patient ID: Nicole Browning, female   DOB: 08-18-1970, 50 y.o.   MRN: [id number]  PROGRESS NOTE    Nicole Browning  DQQ:[id number] DOB: Jul 25, 1970 DOA: 09/25/2020 PCP: Kerin Perna, NP   Brief Narrative:  50 year old female with recent diagnosis of IBD in April 2022 presented with abdominal pain, nausea and vomiting.  On presentation, she was slightly hypotensive which improved with IV fluids.  Creatinine was elevated at 2.54 along with AST of 133, ALT of 231 and total bilirubin of 0.8.  CT of the abdomen and pelvis showed features concerning for sigmoid mass with possible hepatic metastasis.  She was given a dose of IV Rocephin in the ED for possible UTI.  GI was consulted.  Assessment & Plan:   Acute renal failure -Possibly prerenal from dehydration and poor oral intake.   -Presented with creatinine of 2.54; prior creatinine was 0.79 -improving with iv fluids: 0.75 today.  Continue IV fluids at 50 cc an hour.  Oral intake is still not that good.  Possible sigmoid mass with possible hepatic metastases versus abscesses Elevated LFTs -LFTs improving -GI following.  MRI of abdomen and pelvis showed sigmoid mass with air-fluid level along with separate hyperintense mass in the posterior left pelvis adherent to sigmoid serosa; this could be peritoneal metastatic disease versus pelvic abscesses and fistulous communication to the sigmoid colon could not be excluded.  2 liver masses were also identified, metastatic disease versus abscesses.  CT chest showed subcarinal lymph node with concern for metastatic disease and distal esophageal wall thickening. -Currently on Zosyn.  CRP still elevated but slightly improving today. -Status post ultrasound-guided paracentesis on 09/19/2020: Small amount of perihepatic ascites was found in 100 cc fluid removed.  IR wanted to wait on culture and cytology reports and a few days of IV antibiotics prior to doing liver biopsy -General surgery also evaluated the  patient and recommended no surgical intervention  -Status post EGD on 09/29/2020 which showed erosive gastritis.  GI is recommending CT abdomen and pelvis with p.o. and IV contrast today and if still abnormal then patient will need IR guided liver biopsy.  Leukocytosis -Worsening  Recently diagnosed IBD -On Lialda.  Iron deficiency anemia -Hemoglobin stable.  Monitor  Hyponatremia -Mild.  IV fluids plan as above.  Signs of sacroiliitis -Seen on CAT scan.  Hypoalbuminemia Severe malnutrition -From recent poor oral intake.  Monitor -Follow nutrition recommendations    DVT prophylaxis: SCDs. Code Status: Full Family Communication: None at bedside Disposition Plan: Status is: Inpatient because: Inpatient level of care appropriate due to severity of illness  Dispo: The patient is from: Home              Anticipated d/c is to: Home              Patient currently is not medically stable to d/c.   Difficult to place patient No   Consultants: GI/IR/general surgery  Procedures: ultrasound-guided paracentesis on 09/19/2020: Small amount of perihepatic ascites was found in 100 cc fluid removed EGD on 09/29/2020 Impression:               - Minimal hiatal hernia.                           - Erosive gastritis. Recommendation:           - Return patient to hospital ward for ongoing care.                           -  Resume previous diet.                           - Continue present medications.                           - Await pathology results.                           - Would proceed with CT Abdo/pelvis with p.o. and                           IV contrast in AM. If still abn, would need IR                           guided biopsy. For now we will continue Zosyn.                           - The findings and recommendations were discussed                           with the patient. Antimicrobials:  Anti-infectives (From admission, onward)    Start     Dose/Rate Route Frequency  Ordered Stop   09/27/20 0145  piperacillin-tazobactam (ZOSYN) IVPB 3.375 g        3.375 g 12.5 mL/hr over 240 Minutes Intravenous Every 8 hours 09/27/20 0057     09/26/20 2200  cefTRIAXone (ROCEPHIN) 1 g in sodium chloride 0.9 % 100 mL IVPB  Status:  Discontinued        1 g 200 mL/hr over 30 Minutes Intravenous Every 24 hours 09/25/20 2331 09/26/20 0617   09/25/20 2300  cefTRIAXone (ROCEPHIN) 1 g in sodium chloride 0.9 % 100 mL IVPB        1 g 200 mL/hr over 30 Minutes Intravenous  Once 09/25/20 2245 09/25/20 2353         Subjective: Patient seen and examined at bedside.  No overnight fever or worsening shortness of breath reported.  Still complains of intermittent abdominal pain and nausea. objective: Vitals:   09/29/20 2032 09/29/20 2040 09/30/20 0500 09/30/20 0536  BP: 129/88   112/76  Pulse: (!) 111 (!) 109  100  Resp: 16   16  Temp: 98.6 F (37 C)   98.9 F (37.2 C)  TempSrc: Oral   Oral  SpO2: 93%   96%  Weight:   56.7 kg   Height:        Intake/Output Summary (Last 24 hours) at 09/30/2020 0828 Last data filed at 09/29/2020 2201 Gross per 24 hour  Intake 1380.83 ml  Output --  Net 1380.83 ml    Filed Weights   09/26/20 2022 09/29/20 0912 09/30/20 0500  Weight: 56.7 kg 56.7 kg 56.7 kg    Examination:  General exam: Currently on room air.  No distress.   Respiratory system: Bilateral decreased breath sounds at bases with scattered crackles  cardiovascular system: Intermittently tachycardic; S1-S2 heard gastrointestinal system: Abdomen is distended, soft and mildly tender diffusely in the lower quadrant.  Normal bowel sounds heard extremities: Trace lower extremity edema present; no cyanosis Central nervous system: Still extremely slow to respond; alert.  No focal neurological deficits.  Moving extremities. skin: No obvious  petechiae/lesions psychiatry: Affect is flat.  Extremely poor historian.  Data Reviewed: I have personally reviewed following labs and  imaging studies  CBC: Recent Labs  Lab 09/26/20 0350 09/27/20 0343 09/28/20 0348 09/29/20 0224 09/30/20 0239  WBC 12.7* 10.9* 11.5* 16.7* 20.0*  NEUTROABS 10.6* 8.3* 8.1* 12.0* 16.4*  HGB 9.7* 8.5* 8.4* 8.2* 8.1*  HCT 29.4* 25.4* 25.4* 24.4* 24.2*  MCV 83.5 80.1 81.7 81.3 81.8  PLT 240 283 322 353 403    Basic Metabolic Panel: Recent Labs  Lab 09/26/20 0350 09/27/20 0343 09/28/20 0348 09/29/20 0224 09/30/20 0239  NA 131* 131* 135 136 134*  K 3.9 3.3* 4.0 4.1 4.5  CL 100 98 101 103 101  CO2 20* 25 28 26 27   GLUCOSE 108* 119* 115* 110* 151*  BUN 44* 10 8 5* 6  CREATININE 1.52* 0.78 0.75 0.66 0.76  CALCIUM 8.1* 8.3* 8.8* 8.7* 8.8*  MG  --  2.2 1.8 1.7 1.7    GFR: Estimated Creatinine Clearance: 72.6 mL/min (by C-G formula based on SCr of 0.76 mg/dL). Liver Function Tests: Recent Labs  Lab 09/26/20 0350 09/27/20 0343 09/28/20 0348 09/29/20 0224 09/30/20 0239  AST 116* 55* 41 35 28  ALT 186* 131* 99* 77* 63*  ALKPHOS 62 60 73 71 75  BILITOT 0.8 0.7 0.8 0.9 0.5  PROT 6.2* 5.8* 5.8* 5.7* 5.9*  ALBUMIN 2.0* 1.8* 1.7* 1.6* 1.6*    Recent Labs  Lab 09/25/20 1641  LIPASE 22    No results for input(s): AMMONIA in the last 168 hours. Coagulation Profile: Recent Labs  Lab 09/27/20 0725  INR 1.2    Cardiac Enzymes: No results for input(s): CKTOTAL, CKMB, CKMBINDEX, TROPONINI in the last 168 hours. BNP (last 3 results) No results for input(s): PROBNP in the last 8760 hours. HbA1C: No results for input(s): HGBA1C in the last 72 hours. CBG: No results for input(s): GLUCAP in the last 168 hours. Lipid Profile: No results for input(s): CHOL, HDL, LDLCALC, TRIG, CHOLHDL, LDLDIRECT in the last 72 hours. Thyroid Function Tests: No results for input(s): TSH, T4TOTAL, FREET4, T3FREE, THYROIDAB in the last 72 hours. Anemia Panel: No results for input(s): VITAMINB12, FOLATE, FERRITIN, TIBC, IRON, RETICCTPCT in the last 72 hours. Sepsis Labs: Recent Labs  Lab  09/26/20 2140  PROCALCITON 15.92  LATICACIDVEN 1.1     Recent Results (from the past 240 hour(s))  Urine Culture     Status: Abnormal   Collection Time: 09/25/20 10:01 PM   Specimen: Urine, Clean Catch  Result Value Ref Range Status   Specimen Description URINE, CLEAN CATCH  Final   Special Requests   Final    NONE Performed at [facility], [place] [address] [place], [place] [address]    Culture MULTIPLE SPECIES PRESENT, SUGGEST RECOLLECTION (A)  Final   Report Status 09/27/2020 FINAL  Final  Resp Panel by RT-PCR (Flu A&B, Covid) Nasopharyngeal Swab     Status: None   Collection Time: 09/25/20 10:09 PM   Specimen: Nasopharyngeal Swab; Nasopharyngeal(NP) swabs in vial transport medium  Result Value Ref Range Status   SARS Coronavirus 2 by RT PCR NEGATIVE NEGATIVE Final    Comment: (NOTE) SARS-CoV-2 target nucleic acids are NOT DETECTED.  The SARS-CoV-2 RNA is generally detectable in upper respiratory specimens during the acute phase of infection. The lowest concentration of SARS-CoV-2 viral copies this assay can detect is 138 copies/mL. A negative result does not preclude SARS-Cov-2 infection and should not be used as the sole  basis for treatment or other patient management decisions. A negative result may occur with  improper specimen collection/handling, submission of specimen other than nasopharyngeal swab, presence of viral mutation(s) within the areas targeted by this assay, and inadequate number of viral copies(<138 copies/mL). A negative result must be combined with clinical observations, patient history, and epidemiological information. The expected result is Negative.  Fact Sheet for Patients:  EntrepreneurPulse.com.au  Fact Sheet for Healthcare Providers:  IncredibleEmployment.be  This test is no t yet approved or cleared by the [place] FDA and  has been authorized for detection and/or diagnosis of SARS-CoV-2  by FDA under an Emergency Use Authorization (EUA). This EUA will remain  in effect (meaning this test can be used) for the duration of the COVID-19 declaration under Section 564(b)(1) of the Act, 21 U.S.C.section 360bbb-3(b)(1), unless the authorization is terminated  or revoked sooner.       Influenza A by PCR NEGATIVE NEGATIVE Final   Influenza B by PCR NEGATIVE NEGATIVE Final    Comment: (NOTE) The Xpert Xpress SARS-CoV-2/FLU/RSV plus assay is intended as an aid in the diagnosis of influenza from Nasopharyngeal swab specimens and should not be used as a sole basis for treatment. Nasal washings and aspirates are unacceptable for Xpert Xpress SARS-CoV-2/FLU/RSV testing.  Fact Sheet for Patients: EntrepreneurPulse.com.au  Fact Sheet for Healthcare Providers: IncredibleEmployment.be  This test is not yet approved or cleared by the [place] FDA and has been authorized for detection and/or diagnosis of SARS-CoV-2 by FDA under an Emergency Use Authorization (EUA). This EUA will remain in effect (meaning this test can be used) for the duration of the COVID-19 declaration under Section 564(b)(1) of the Act, 21 U.S.C. section 360bbb-3(b)(1), unless the authorization is terminated or revoked.  Performed at [facility], [place] [address], [place], [place] [address]   Culture, blood (routine x 2)     Status: None (Preliminary result)   Collection Time: 09/26/20  9:40 PM   Specimen: BLOOD  Result Value Ref Range Status   Specimen Description BLOOD SITE NOT SPECIFIED  Final   Special Requests   Final    BOTTLES DRAWN AEROBIC AND ANAEROBIC Blood Culture adequate volume   Culture   Final    NO GROWTH 3 DAYS Performed at [facility], [address] [place], [place] [address]    Report Status PENDING  Incomplete  Culture, blood (routine x 2)     Status: None (Preliminary result)   Collection Time: 09/26/20  9:46 PM   Specimen: BLOOD   Result Value Ref Range Status   Specimen Description BLOOD LEFT ANTECUBITAL  Final   Special Requests   Final    BOTTLES DRAWN AEROBIC AND ANAEROBIC Blood Culture adequate volume   Culture   Final    NO GROWTH 3 DAYS Performed at [facility], [place] [address] [place], [place] [address]    Report Status PENDING  Incomplete  Body fluid culture w Gram Stain     Status: None (Preliminary result)   Collection Time: 09/27/20 11:30 AM   Specimen: Peritoneal Washings; Peritoneal Fluid  Result Value Ref Range Status   Specimen Description PERITONEAL FLUID  Final   Special Requests NONE  Final   Gram Stain   Final    ABUNDANT WBC PRESENT, PREDOMINANTLY PMN NO ORGANISMS SEEN    Culture   Final    MODERATE STREPTOCOCCUS GROUP F SUSCEPTIBILITIES TO FOLLOW Performed at [facility], [address]  [place], [place] [address]    Report Status PENDING  Incomplete          Radiology Studies: No results found.      Scheduled Meds:  feeding supplement  237 mL Oral TID BM   mesalamine  1.2 g Oral BID   multivitamin with minerals  1 tablet Oral Daily   Continuous Infusions:  0.9 % NaCl with KCl 40 mEq / L 50 mL/hr at 09/29/20 1527   piperacillin-tazobactam (ZOSYN)  IV 3.375 g (09/30/20 0524)          Aline August, MD [facility] 09/30/2020, 8:28 AM

## 2020-09-30 NOTE — Progress Notes (Signed)
Spoke with patient regarding hospital visitation policy and her underage daughter who is 50 years old wanting to stay tonight with the patient. Librarian, academic of being rude and that I do not understand her situation. A friend visited her today with daughter and  basically dropped her off with the patient. Spoke with Buyer, retail and house supervisor about the situation and they both said no.

## 2020-09-30 NOTE — Plan of Care (Signed)
  Problem: Activity: Goal: Risk for activity intolerance will decrease Outcome: Progressing   Problem: Elimination: Goal: Will not experience complications related to bowel motility Outcome: Progressing   Problem: Elimination: Goal: Will not experience complications related to urinary retention Outcome: Progressing   Problem: Nutrition: Goal: Adequate nutrition will be maintained Outcome: Not Progressing

## 2020-09-30 NOTE — Progress Notes (Addendum)
Progress Note  Chief Complaint:  abdominal pain, abnormal sigmoid colon and liver masses on imaging       ASSESSMENT AND PLAN   # 50 yo female admitted with abdominal pain, nausea / vomiting, AKI,  leukocytosis and several abdominal findings on imagingI including distal esophageal wall thickening, possible gastric wall thickening, liver masses, generalized peritoneal thickening, left pelvic mass adherent to sigmoid serosa, small amount of ascites.  --CEA, CA-125, CA 19-9 all normal.  --Diagnostic paracentesis showed abundant WBCs, culture >> moderate streptococcus constellatus. Cytology negative for malignancy.  --EGD >> erosive gastritis.  --Previous screening colonoscopy in April of this years with findings of chronic moderately active colitis, started on lialda. Smal inflammatory polyp removed.  --CTAP w/ contrast today (previous studies non-contrast) again demonstrates masses within the liver,  may be parenchymal or peritoneal.  Moderately thickened sigmoid colon wall with surrounding fluid collection. There is a  new loculated fluid collection within the anterior abdomen with peritoneal thickening and enhancement .  Overall findings are again findings are suspicious for metastatic disease to the peritoneum  --Surgery has evaluated, no surgical needs at this time.  Interventional radiology is following and plan is for liver biopsy tomorrow. If liver biopsy unrevealing then consider repeat colonoscopy ( or at least flexible sigmoidoscopy)   # Leukocytosis, worsening. WBC 16.7 >> 20.  On Zosyn. CRP 38 >> 24.8  # Colitis, mild to moderately active pancolitis on screening colonoscopy in April 2022.  Started on Lialda which she was taking up until this admission   # IDA. Hgb stable at 8.1. Got iron infusion   DIAGNOSTIC STUDIES   06/22/20 Screening colonoscopy  --Perianal abscess found on perianal exam. - One 6 mm polyp in the ascending colon, removed with a cold snare. Resected and  retrieved. - Diffuse mild inflammation was found in the entire examined colon secondary to pancolitis. Biopsied. - Ileitis. Biopsied. - The examination was otherwise normal on direct and retroflexion views. Diagnosis 1. Surgical [P], right colon bx - CHRONIC MODERATELY ACTIVE COLITIS. - NO DYSPLASIA OR MALIGNANCY. 2. Surgical [P], small bowel, terminal ileum bx - BENIGN SMALL BOWEL MUCOSA. - NO ACTIVE INFLAMMATION. - NO DYSPLASIA OR MALIGNANCY. 3. Surgical [P], colon, ascending, polyp (1) - INFLAMMATORY POLYP. - NO DYSPLASIA OR MALIGNANCY. 4. Surgical [P], colon, transverse bx - CHRONIC MILDLY ACTIVE COLITIS. - NO DYSPLASIA OR MALIGNANCY. 5. Surgical [P], left colon bx - CHRONIC MILDLY ACTIVE COLITIS. - NO DYSPLASIA OR MALIGNANCY. 6. Surgical [P], colon, rectum bx - BENIGN COLONIC MUCOSA. - NO ACTIVE INFLAMMATION. - NO DYSPLASIA OR MALIGNANCY.  09/25/20 Non contrast CTAP IMPRESSION: 1. 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 Hepatic MRI or CT with contrast may be helpful as the patient is able for further evaluation. 2. Question of gastric antral thickening.  Areas not well assessed. 3. Signs of sacroiliitis with asymmetric involvement of the RIGHT sacral iliac joint. Correlate with any clinical or laboratory evidence of seronegative spondyloarthropathy. 4. Juxta diaphragmatic airspace disease in the RIGHT chest favored to represent juxta diaphragmatic atelectasis. 5. Aortic atherosclerosis   09/26/20 MRI wo contrast IMPRESSION: 1. Limited motion degraded incomplete MRI abdomen study performed without IV contrast, discontinued prior to study completion at the patient's request. 2. Two similar indeterminate heterogeneous liver masses measuring 5.6 cm in the caudate lobe and 5.3 cm in the posterior left liver, incompletely evaluated. Metastatic disease not excluded. Consider IR  consultation for  potential ultrasound-guided liver biopsy. If clinically feasible, liver protocol CT abdomen and pelvis without and with IV contrast may be considered for further evaluation prior to potential biopsy. 3. Mild generalized peritoneal thickening. Trace abdominal ascites. 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 4. Streaky curvilinear opacities at the dependent right greater than left lung bases, not well evaluated by MRI. Chest CT may be obtained for further evaluation as clinically warranted.  09/27/20 Chest CT scan w/ contrast IMPRESSION: Small left pleural effusion is noted. Mild bilateral posterior basilar subsegmental atelectasis or infiltrates are noted.   Possible 2 cm subcarinal lymph node is noted which may represent metastatic disease.   Wall thickening of distal esophagus is noted concerning for esophagitis. Endoscopy is recommended for further evaluation.   Hepatic masses are again noted as described on prior CT scan, concerning for malignancy or metastatic disease   09/29/20 EGD- minimal hiatal hernia, erosive gastritis   09/30/20 CTAP w/ contrast IMPRESSION: 1. Findings which are again suspicious for metastatic disease to the peritoneum and possibly hepatic parenchyma. Dominant capsular based liver masses have been detailed previously. A new loculated fluid collection within the anterior abdomen with peritoneal thickening/enhancement. Differential considerations include infected ascites/developing abscess or peritoneal carcinomatosis. Correlate with paracentesis results of 1 day prior. 2. Sigmoid wall thickening which could represent the site of primary neoplasm. Pericolonic complex fluid collections could represent developing abscesses, and were detailed on the prior pelvic MRI. 3. No bowel obstruction or other acute complication. 4. New small bilateral  pleural effusions with adjacent atelectasis or infection. 5. Apparent gastric wall thickening, at least partially felt to be due to underdistention.     SUBJECTIVE   Generalized abdominal pain is worse when she tries to eat.        OBJECTIVE      Scheduled inpatient medications:   feeding supplement  237 mL Oral TID BM   mesalamine  1.2 g Oral BID   multivitamin with minerals  1 tablet Oral Daily   Continuous inpatient infusions:   0.9 % NaCl with KCl 40 mEq / L 50 mL/hr at 09/30/20 1055   piperacillin-tazobactam (ZOSYN)  IV 3.375 g (09/30/20 1353)   PRN inpatient medications: morphine injection, ondansetron **OR** ondansetron (ZOFRAN) IV  Vital signs in last 24 hours: Temp:  [98 F (36.7 C)-98.9 F (37.2 C)] 98 F (36.7 C) (07/31 0958) Pulse Rate:  [93-111] 93 (07/31 0958) Resp:  [16-17] 16 (07/31 0536) BP: (112-129)/(76-88) 121/86 (07/31 0958) SpO2:  [93 %-96 %] 96 % (07/31 0958) Weight:  [56.7 kg] 56.7 kg (07/31 0500) Last BM Date: 09/29/20  Intake/Output Summary (Last 24 hours) at 09/30/2020 1515 Last data filed at 09/30/2020 0800 Gross per 24 hour  Intake 1280.83 ml  Output --  Net 1280.83 ml     Physical Exam:  General: Alert female in NAD Heart:  Regular rate and rhythm. No lower extremity edema Pulmonary: Normal respiratory effort Abdomen: Soft, mildly distended, mild generalized tenderness. A few bowel sounds.  Neurologic: Alert and oriented Psych: Pleasant. Cooperative.   Filed Weights   09/26/20 2022 09/29/20 0912 09/30/20 0500  Weight: 56.7 kg 56.7 kg 56.7 kg    Intake/Output from previous day: 07/30 0701 - 07/31 0700 In: 1580.8 [P.O.:640; I.V.:582.8; IV Piggyback:358] Out: -  Intake/Output this shift: Total I/O In: 200 [P.O.:200] Out: -     Lab Results: Recent Labs    09/28/20 0348 09/29/20 0224  09/30/20 0239  WBC 11.5* 16.7* 20.0*  HGB 8.4* 8.2* 8.1*  HCT 25.4* 24.4* 24.2*  PLT 322 353 397   BMET Recent Labs     09/28/20 0348 09/29/20 0224 09/30/20 0239  NA 135 136 134*  K 4.0 4.1 4.5  CL 101 103 101  CO2 28 26 27   GLUCOSE 115* 110* 151*  BUN 8 5* 6  CREATININE 0.75 0.66 0.76  CALCIUM 8.8* 8.7* 8.8*   LFT Recent Labs    09/30/20 0239  PROT 5.9*  ALBUMIN 1.6*  AST 28  ALT 63*  ALKPHOS 75  BILITOT 0.5   PT/INR No results for input(s): LABPROT, INR in the last 72 hours. Hepatitis Panel No results for input(s): HEPBSAG, HCVAB, HEPAIGM, HEPBIGM in the last 72 hours.  CT ABDOMEN PELVIS W CONTRAST  Result Date: 09/30/2020 CLINICAL DATA:  Chronic abdominal pain. Prior CT suspicious for sigmoid neoplasm. History of Crohn disease. EXAM: CT ABDOMEN AND PELVIS WITH CONTRAST TECHNIQUE: Multidetector CT imaging of the abdomen and pelvis was performed using the standard protocol following bolus administration of intravenous contrast. CONTRAST:  142m OMNIPAQUE IOHEXOL 300 MG/ML  SOLN COMPARISON:  Abdominopelvic CT of 09/25/2020. Pelvic abdominopelvic MRI 09/26/2020. FINDINGS: Lower chest: Worsened bibasilar aeration with development of right greater than left base airspace disease. Normal heart size with new tiny bilateral pleural effusions. Hepatobiliary: Capsular based masses within the liver may be parenchymal or peritoneal. Example within the caudate lobe at 6.3 x 6.1 cm on 22/3 and the posterior aspect of segment 2-3 at 6.0 x 4.3 cm on 27/3. Gallbladder wall thickening is nonspecific. No calcified stone or biliary duct dilatation. Pancreas: Normal, without mass or ductal dilatation. Spleen: Normal in size, without focal abnormality. Adrenals/Urinary Tract: Normal adrenal glands. Left renal sinus cyst an interpolar too small to characterize lesion. Normal right kidney. Normal urinary bladder. Stomach/Bowel: Portions of the stomach are decompressed and therefore likely secondarily appear thick walled.The sigmoid is moderately thick walled, including on 71/3. Normal terminal ileum and appendix. Normal  small bowel caliber. No complicating obstruction. Vascular/Lymphatic: Aortic atherosclerosis. No abdominopelvic adenopathy. Porta hepatis nodes of up to 10 mm are not pathologic by size criteria. Reproductive: Normal uterus and adnexa. Other: Development of small volume ascites with loculated fluid collections since 09/25/2020. Example collection with peritoneal thickening and hyperenhancement within the anterior abdomen at 13.8 x 4.3 cm on 42/3. A complex fluid and gas collection cephalad to the thickened sigmoid measures 3.7 x 2.8 cm on 67/3 and is similar to 09/25/2020. Inferior to this thickened sigmoid is a fluid collection as detailed on recent MRI with minimal gas within. Example 3.1 x 2.4 cm on 74/3. No free intraperitoneal air. Presumably iatrogenic nodularity about the anterior abdominal wall. Musculoskeletal: Right sacroiliac joint sclerosis suggest sacroiliitis. IMPRESSION: 1. Findings which are again suspicious for metastatic disease to the peritoneum and possibly hepatic parenchyma. Dominant capsular based liver masses have been detailed previously. A new loculated fluid collection within the anterior abdomen with peritoneal thickening/enhancement. Differential considerations include infected ascites/developing abscess or peritoneal carcinomatosis. Correlate with paracentesis results of 1 day prior. 2. Sigmoid wall thickening which could represent the site of primary neoplasm. Pericolonic complex fluid collections could represent developing abscesses, and were detailed on the prior pelvic MRI. 3. No bowel obstruction or other acute complication. 4. New small bilateral pleural effusions with adjacent atelectasis or infection. 5. Apparent gastric wall thickening, at least partially felt to be due to underdistention. Electronically Signed   By: KAdria DevonD.  On: 09/30/2020 14:22        Principal Problem:   ARF (acute renal failure) (HCC) Active Problems:   IDA (iron deficiency anemia)    IBD (inflammatory bowel disease)   Colonic mass   Abdominal pain   Protein-calorie malnutrition, severe     LOS: 4 days   Tye Savoy ,NP 09/30/2020, 3:15 PM    Attending physician's note   I have taken an interval history, reviewed the chart and examined the patient. I agree with the Advanced Practitioner's note, impression and recommendations.    CT AP with contrast from today reviewed -Suspicious for metastatic disease to peritoneum and liver. -New loculated fluid collection anterior abdomen-abscess vs peritoneal carcinomatosis. -Sigmoid wall thickening.  Could represent primary neoplasm.  Neg colon 3 months ago except IBD.  Given H/O perianal abscesses, likely Crohn's disease.  Neg EGD.  IR guided paracentesis-neg cytology.  Abundant WBCs.  Culture-mod Streptococcus constellatus  Plan: -IR guided liver biopsy in AM -?FS/colon to clarify.  -Continue Zosyn for now.   Dr. Hilarie Fredrickson taking over the service tomorrow.  I will make him aware.    Carmell Austria, MD [facility] [phone]

## 2020-10-01 ENCOUNTER — Inpatient Hospital Stay (HOSPITAL_COMMUNITY): Payer: Medicaid Other

## 2020-10-01 ENCOUNTER — Encounter (HOSPITAL_COMMUNITY): Payer: Self-pay | Admitting: Gastroenterology

## 2020-10-01 DIAGNOSIS — K6389 Other specified diseases of intestine: Secondary | ICD-10-CM | POA: Diagnosis not present

## 2020-10-01 DIAGNOSIS — R7881 Bacteremia: Secondary | ICD-10-CM | POA: Diagnosis not present

## 2020-10-01 DIAGNOSIS — K639 Disease of intestine, unspecified: Secondary | ICD-10-CM

## 2020-10-01 DIAGNOSIS — K750 Abscess of liver: Secondary | ICD-10-CM

## 2020-10-01 DIAGNOSIS — N179 Acute kidney failure, unspecified: Secondary | ICD-10-CM | POA: Diagnosis not present

## 2020-10-01 DIAGNOSIS — R160 Hepatomegaly, not elsewhere classified: Secondary | ICD-10-CM | POA: Diagnosis not present

## 2020-10-01 DIAGNOSIS — K769 Liver disease, unspecified: Secondary | ICD-10-CM

## 2020-10-01 DIAGNOSIS — E43 Unspecified severe protein-calorie malnutrition: Secondary | ICD-10-CM | POA: Diagnosis not present

## 2020-10-01 LAB — COMPREHENSIVE METABOLIC PANEL
ALT: 56 U/L — ABNORMAL HIGH (ref 0–44)
AST: 26 U/L (ref 15–41)
Albumin: 1.7 g/dL — ABNORMAL LOW (ref 3.5–5.0)
Alkaline Phosphatase: 76 U/L (ref 38–126)
Anion gap: 6 (ref 5–15)
BUN: <5 mg/dL — ABNORMAL LOW (ref 6–20)
CO2: 27 mmol/L (ref 22–32)
Calcium: 8.7 mg/dL — ABNORMAL LOW (ref 8.9–10.3)
Chloride: 101 mmol/L (ref 98–111)
Creatinine, Ser: 0.69 mg/dL (ref 0.44–1.00)
GFR, Estimated: >60 mL/min (ref >60)
Glucose, Bld: 130 mg/dL — ABNORMAL HIGH (ref 70–99)
Potassium: 4.3 mmol/L (ref 3.5–5.1)
Sodium: 134 mmol/L — ABNORMAL LOW (ref 135–145)
Total Bilirubin: 0.3 mg/dL (ref 0.3–1.2)
Total Protein: 6.3 g/dL — ABNORMAL LOW (ref 6.5–8.1)

## 2020-10-01 LAB — CBC
HCT: 24.1 % — ABNORMAL LOW (ref 36.0–46.0)
Hemoglobin: 7.8 g/dL — ABNORMAL LOW (ref 12.0–15.0)
MCH: 26.8 pg (ref 26.0–34.0)
MCHC: 32.4 g/dL (ref 30.0–36.0)
MCV: 82.8 fL (ref 80.0–100.0)
Platelets: 497 10*3/uL — ABNORMAL HIGH (ref 150–400)
RBC: 2.91 MIL/uL — ABNORMAL LOW (ref 3.87–5.11)
RDW: 16 % — ABNORMAL HIGH (ref 11.5–15.5)
WBC: 18.2 10*3/uL — ABNORMAL HIGH (ref 4.0–10.5)
nRBC: 0.2 % (ref 0.0–0.2)

## 2020-10-01 LAB — PROTIME-INR
INR: 1.2 (ref 0.8–1.2)
Prothrombin Time: 15.3 seconds — ABNORMAL HIGH (ref 11.4–15.2)

## 2020-10-01 LAB — CULTURE, BLOOD (ROUTINE X 2)
Culture: NO GROWTH
Culture: NO GROWTH
Special Requests: ADEQUATE
Special Requests: ADEQUATE

## 2020-10-01 LAB — ECHOCARDIOGRAM COMPLETE
Area-P 1/2: 7.29 cm2
Height: 64 in
S' Lateral: 2.9 cm
Weight: 2000.01 oz

## 2020-10-01 LAB — BODY FLUID CULTURE W GRAM STAIN

## 2020-10-01 LAB — C-REACTIVE PROTEIN: CRP: 20.9 mg/dL — ABNORMAL HIGH (ref <1.0)

## 2020-10-01 LAB — MAGNESIUM: Magnesium: 1.9 mg/dL (ref 1.7–2.4)

## 2020-10-01 IMAGING — US US BIOPSY CORE LIVER
1 series · 13 of 17 positions shown · non-contrast
Comparison: none

INDICATION: 50-year-old female with a history of liver masses, known
inflammatory bowel disease, admitted for sepsis.

[Series 1: us biopsy (liver) · 13 of 17 slices shown]
[im 1/17]
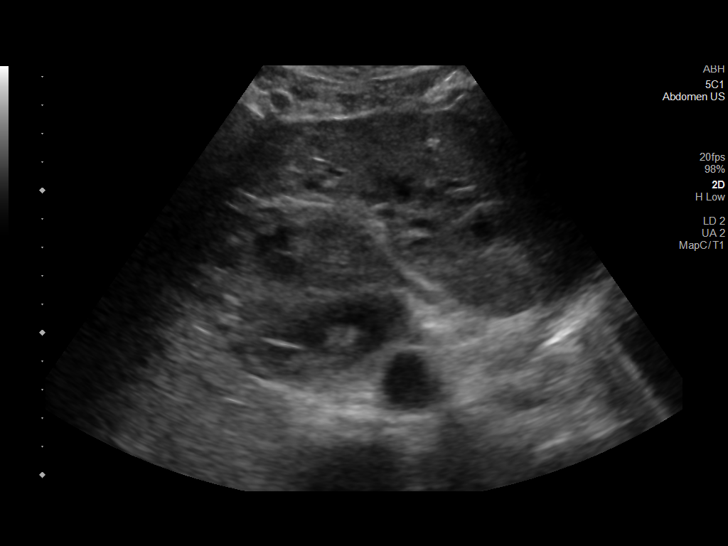
[im 2/17]
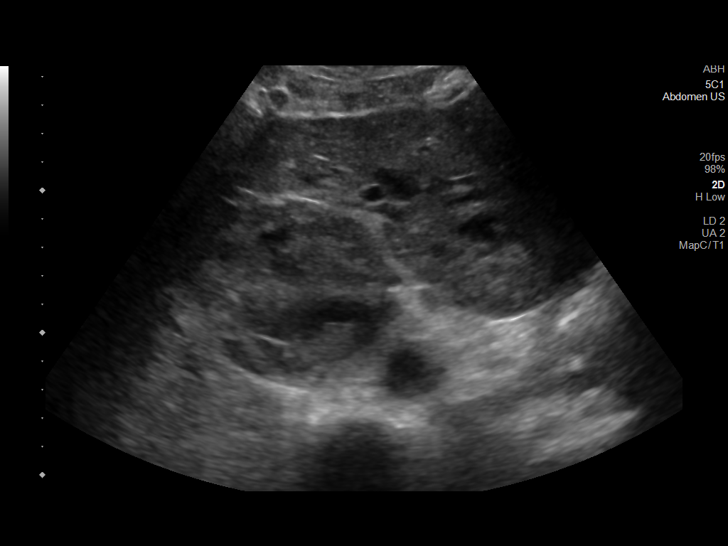
[im 4/17]
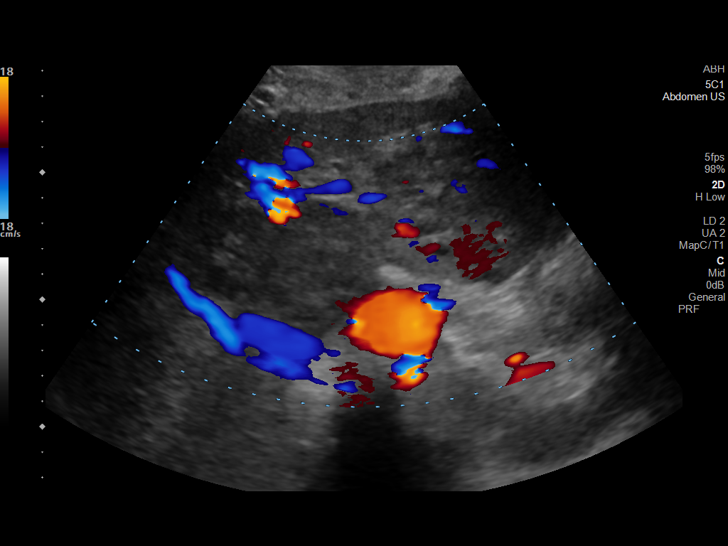
[im 5/17]
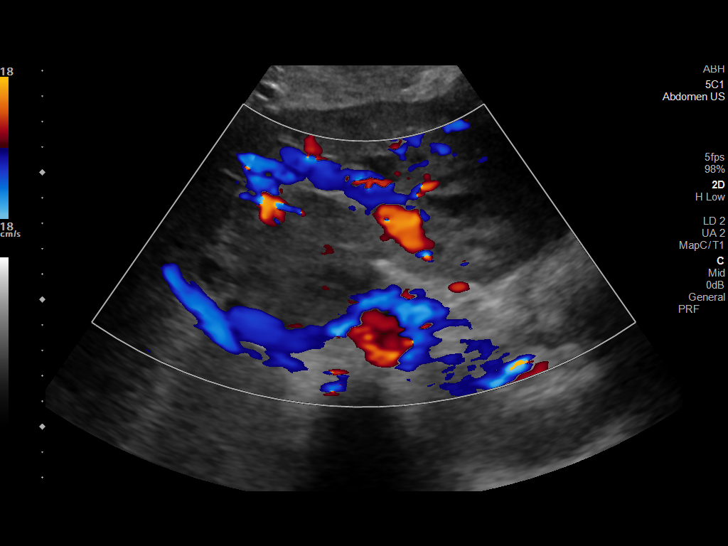
[im 6/17]
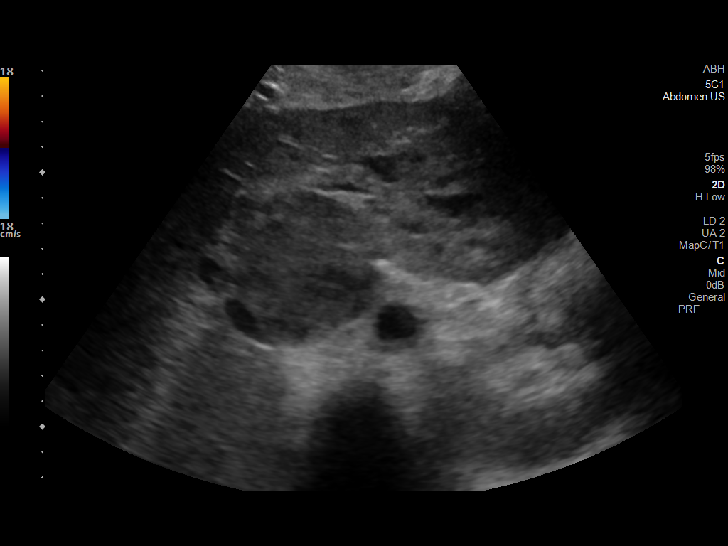
[im 8/17]
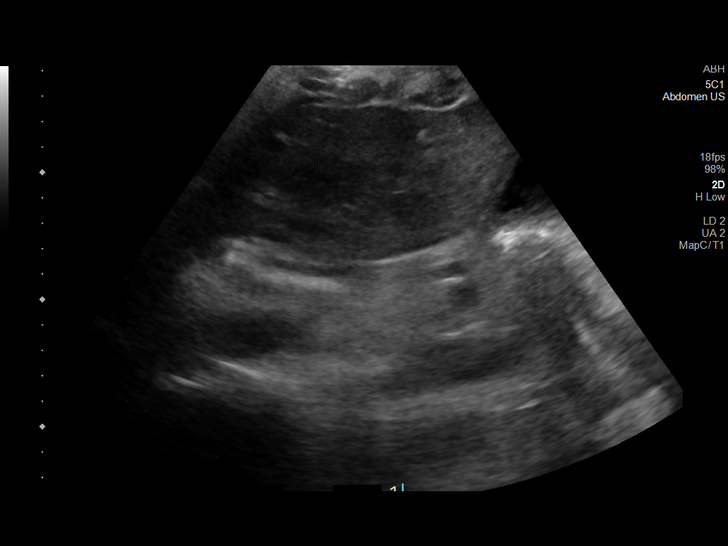
[im 9/17]
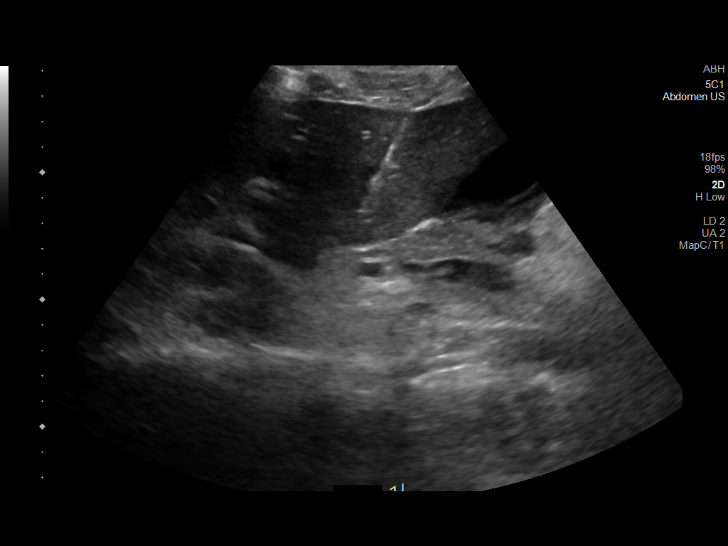
[im 10/17]
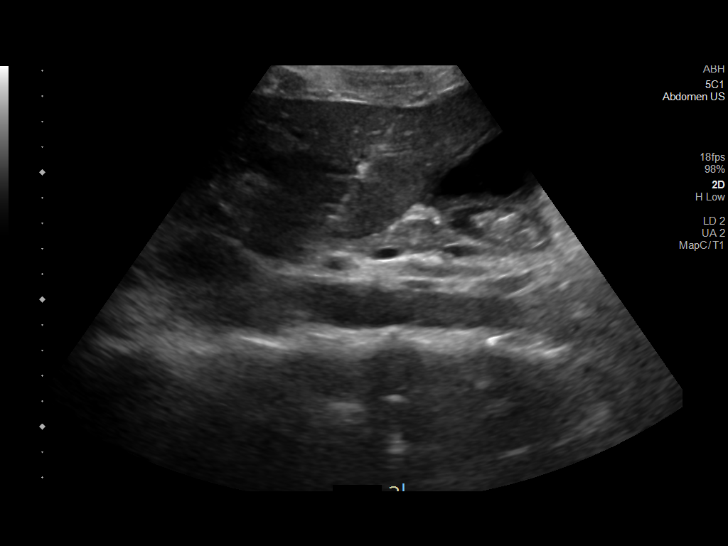
[im 12/17]
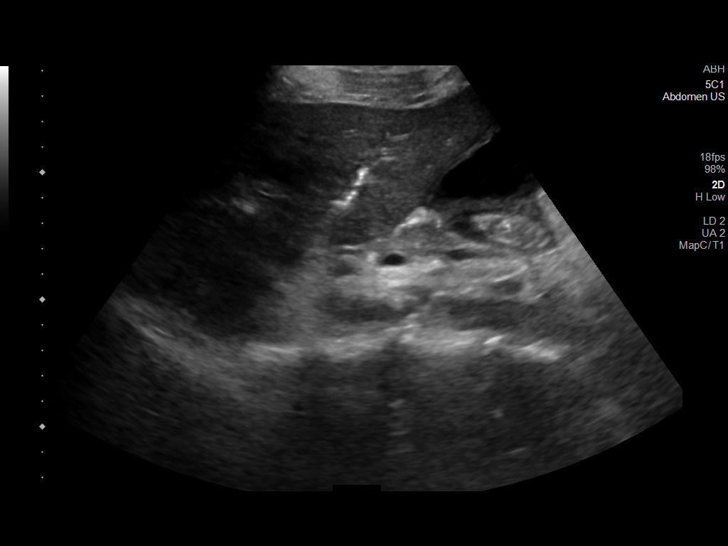
[im 13/17]
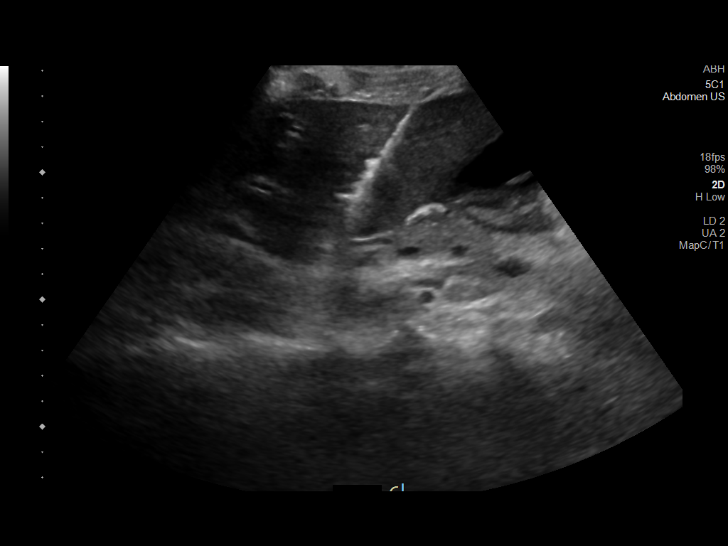
[im 14/17]
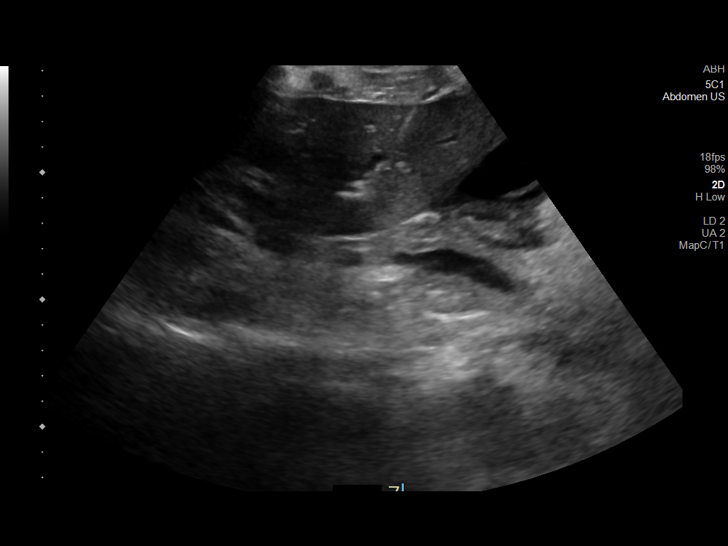
[im 16/17]
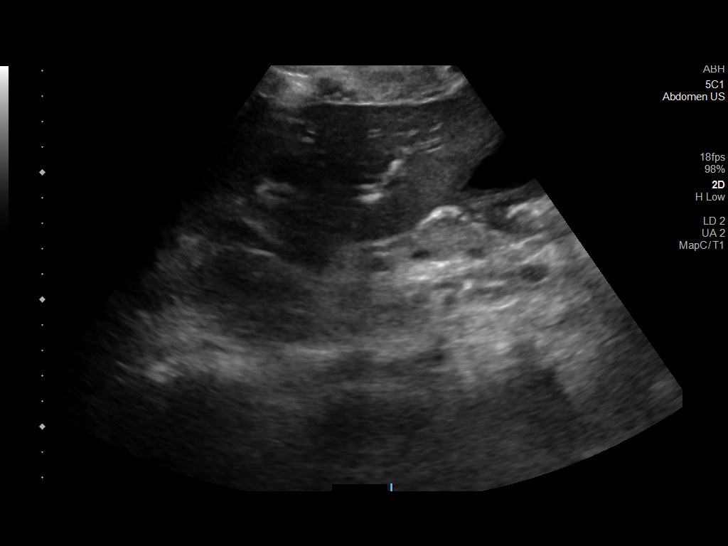
[im 17/17]
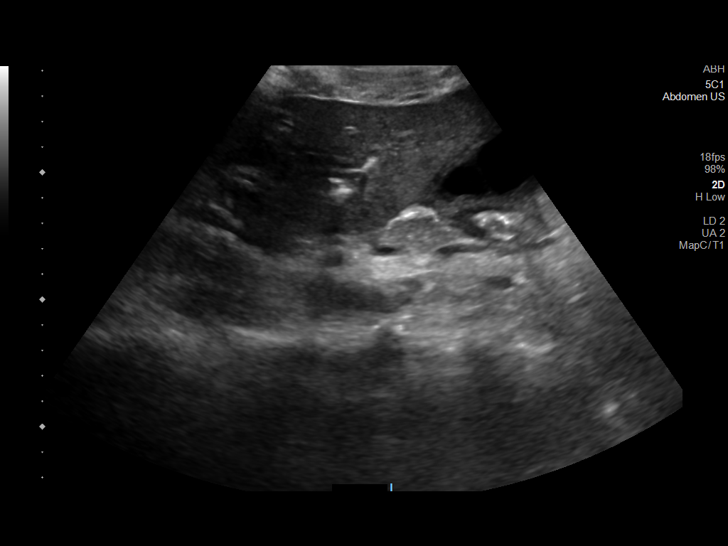

[13 of 17 positions shown; findings below may reference images not displayed]

EXAM:
ULTRASOUND-GUIDED LIVER MASS BIOPSY

ULTRASOUND-GUIDED LIVER MASS CULTURE/ASPIRATION

MEDICATIONS:
None.

ANESTHESIA/SEDATION:
Moderate (conscious) sedation was employed during this procedure. A
total of Versed 2.0 mg and Fentanyl 75 mcg was administered
intravenously.

Moderate Sedation Time: 14 minutes. The patient's level of
consciousness and vital signs were monitored continuously by
radiology nursing throughout the procedure under my direct
supervision.

FLUOROSCOPY TIME:  Ultrasound

COMPLICATIONS:
None

PROCEDURE:
Informed written consent was obtained from the patient after a
thorough discussion of the procedural risks, benefits and
alternatives. All questions were addressed. Maximal Sterile Barrier
Technique was utilized including caps, mask, sterile gowns, sterile
gloves, sterile drape, hand hygiene and skin antiseptic. A timeout
was performed prior to the initiation of the procedure.

Ultrasound survey of the right liver lobe performed with images
stored and sent to PACs.

The subxiphoid region was prepped with chlorhexidine in a sterile
fashion, and a sterile drape was applied covering the operative
field. A sterile gown and sterile gloves were used for the
procedure. Local anesthesia was provided with 1% Lidocaine.

The patient was prepped and draped sterilely and the skin and
subcutaneous tissues were generously infiltrated with 1% lidocaine.

A 17 gauge introducer needle was then advanced under ultrasound
guidance in subxiphoid location into the left liver, targeting the
heterogeneous mass in the posterior left liver. The stylet was
removed, and multiple separate 18 gauge core biopsy were retrieved.
Samples were placed into both formalin solution, as well as sterile
saline solution. The needle was then aspirated upon withdrawal for a
liquid culture to be sent to the lab.

Final ultrasound image was performed.

The patient tolerated the procedure well and remained
hemodynamically stable throughout.

No complications were encountered and no significant blood loss was
encounter.
FINDINGS: Ultrasound demonstrates solid appearance of both right liver mass
and left liver mass, with no ultrasound correlate of what appears to
be some septations and fluid on the prior MR and CT imaging.
IMPRESSION: Status post ultrasound-guided left liver mass biopsy and liver mass
culture/aspiration, as diagnostic test for possible metastatic
lesion versus infection

## 2020-10-01 MED ORDER — FENTANYL CITRATE (PF) 100 MCG/2ML IJ SOLN
INTRAMUSCULAR | Status: AC | PRN
  Administered 2020-10-01: 25 ug via INTRAVENOUS
  Administered 2020-10-01: 50 ug via INTRAVENOUS

## 2020-10-01 MED ORDER — GELATIN ABSORBABLE 12-7 MM EX MISC
CUTANEOUS | Status: AC
  Filled 2020-10-01: qty 1

## 2020-10-01 MED ORDER — MIDAZOLAM HCL 2 MG/2ML IJ SOLN
INTRAMUSCULAR | Status: AC | PRN
  Administered 2020-10-01 (×2): 1 mg via INTRAVENOUS

## 2020-10-01 MED ORDER — METRONIDAZOLE 500 MG/100ML IV SOLN
500.0000 mg | Freq: Three times a day (TID) | INTRAVENOUS | Status: DC
  Administered 2020-10-01 – 2020-10-05 (×12): 500 mg via INTRAVENOUS
  Filled 2020-10-01 (×10): qty 100

## 2020-10-01 MED ORDER — LEVOFLOXACIN IN D5W 750 MG/150ML IV SOLN
750.0000 mg | INTRAVENOUS | Status: DC
  Administered 2020-10-01: 750 mg via INTRAVENOUS
  Filled 2020-10-01 (×2): qty 150

## 2020-10-01 MED ORDER — MIDAZOLAM HCL 2 MG/2ML IJ SOLN
INTRAMUSCULAR | Status: AC
  Filled 2020-10-01: qty 4

## 2020-10-01 MED ORDER — LIDOCAINE HCL (PF) 1 % IJ SOLN
INTRAMUSCULAR | Status: AC
  Filled 2020-10-01: qty 30

## 2020-10-01 MED ORDER — FENTANYL CITRATE (PF) 100 MCG/2ML IJ SOLN
INTRAMUSCULAR | Status: AC
  Filled 2020-10-01: qty 2

## 2020-10-01 NOTE — Progress Notes (Signed)
  Echocardiogram 2D Echocardiogram has been performed.  Nicole Browning 10/01/2020, 3:34 PM

## 2020-10-01 NOTE — Progress Notes (Signed)
Progress Note   Subjective  Patient had IR/ultrasound-guided liver biopsy this morning with Dr. Earleen Newport Liver lesions noted to be solid by ultrasound She is resting but sleepy after biopsy, no new complaints She is a bit frustrated that her 50 year old daughter was not allowed to stay with her last night.  She is a single parent and relying on friends and coworkers to care for her daughter while she is hospitalized.  She feels that a nurse was rude to her overnight regarding her daughter.   Objective  Vital signs in last 24 hours: Temp:  [97.5 F (36.4 C)-99.7 F (37.6 C)] 97.9 F (36.6 C) (08/01 1040) Pulse Rate:  [96-109] 96 (08/01 1040) Resp:  [14-20] 18 (08/01 1040) BP: (117-145)/(81-91) 126/83 (08/01 1040) SpO2:  [94 %-100 %] 94 % (08/01 1040) Last BM Date: 09/28/20  General: Alert, well-developed, in NAD Heart:  Regular rate and rhythm; no murmurs Chest: Clear to ascultation bilaterally Abdomen:  Soft, mildly distended and slightly tender without rebound or guarding, present bowel sounds though hypoactive.   Neurologic:  Alert and  oriented x4; grossly normal neurologically. Psych:  Alert and cooperative. Normal mood and affect.  Ascites culture positive for group F strep; intermediate sensitivity to penicillin Now blood culture positive for group F strep  Intake/Output from previous day: 07/31 0701 - 08/01 0700 In: 1609.7 [P.O.:820; I.V.:636.5; IV Piggyback:153.1] Out: 1900 [Urine:1900] Intake/Output this shift: No intake/output data recorded.  Lab Results: Recent Labs    09/29/20 0224 09/30/20 0239 10/01/20 0008  WBC 16.7* 20.0* 18.2*  HGB 8.2* 8.1* 7.8*  HCT 24.4* 24.2* 24.1*  PLT 353 397 497*   BMET Recent Labs    09/29/20 0224 09/30/20 0239 10/01/20 0008  NA 136 134* 134*  K 4.1 4.5 4.3  CL 103 101 101  CO2 26 27 27   GLUCOSE 110* 151* 130*  BUN 5* 6 <5*  CREATININE 0.66 0.76 0.69  CALCIUM 8.7* 8.8* 8.7*   LFT Recent Labs     10/01/20 0008  PROT 6.3*  ALBUMIN 1.7*  AST 26  ALT 56*  ALKPHOS 76  BILITOT 0.3   PT/INR Recent Labs    10/01/20 0008  LABPROT 15.3*  INR 1.2   Hepatitis Panel No results for input(s): HEPBSAG, HCVAB, HEPAIGM, HEPBIGM in the last 72 hours.  Studies/Results: CT ABDOMEN PELVIS W CONTRAST  Result Date: 09/30/2020 CLINICAL DATA:  Chronic abdominal pain. Prior CT suspicious for sigmoid neoplasm. History of Crohn disease. EXAM: CT ABDOMEN AND PELVIS WITH CONTRAST TECHNIQUE: Multidetector CT imaging of the abdomen and pelvis was performed using the standard protocol following bolus administration of intravenous contrast. CONTRAST:  174m OMNIPAQUE IOHEXOL 300 MG/ML  SOLN COMPARISON:  Abdominopelvic CT of 09/25/2020. Pelvic abdominopelvic MRI 09/26/2020. FINDINGS: Lower chest: Worsened bibasilar aeration with development of right greater than left base airspace disease. Normal heart size with new tiny bilateral pleural effusions. Hepatobiliary: Capsular based masses within the liver may be parenchymal or peritoneal. Example within the caudate lobe at 6.3 x 6.1 cm on 22/3 and the posterior aspect of segment 2-3 at 6.0 x 4.3 cm on 27/3. Gallbladder wall thickening is nonspecific. No calcified stone or biliary duct dilatation. Pancreas: Normal, without mass or ductal dilatation. Spleen: Normal in size, without focal abnormality. Adrenals/Urinary Tract: Normal adrenal glands. Left renal sinus cyst an interpolar too small to characterize lesion. Normal right kidney. Normal urinary bladder. Stomach/Bowel: Portions of the stomach are decompressed and therefore likely secondarily appear thick walled.The sigmoid is moderately  thick walled, including on 71/3. Normal terminal ileum and appendix. Normal small bowel caliber. No complicating obstruction. Vascular/Lymphatic: Aortic atherosclerosis. No abdominopelvic adenopathy. Porta hepatis nodes of up to 10 mm are not pathologic by size criteria. Reproductive:  Normal uterus and adnexa. Other: Development of small volume ascites with loculated fluid collections since 09/25/2020. Example collection with peritoneal thickening and hyperenhancement within the anterior abdomen at 13.8 x 4.3 cm on 42/3. A complex fluid and gas collection cephalad to the thickened sigmoid measures 3.7 x 2.8 cm on 67/3 and is similar to 09/25/2020. Inferior to this thickened sigmoid is a fluid collection as detailed on recent MRI with minimal gas within. Example 3.1 x 2.4 cm on 74/3. No free intraperitoneal air. Presumably iatrogenic nodularity about the anterior abdominal wall. Musculoskeletal: Right sacroiliac joint sclerosis suggest sacroiliitis. IMPRESSION: 1. Findings which are again suspicious for metastatic disease to the peritoneum and possibly hepatic parenchyma. Dominant capsular based liver masses have been detailed previously. A new loculated fluid collection within the anterior abdomen with peritoneal thickening/enhancement. Differential considerations include infected ascites/developing abscess or peritoneal carcinomatosis. Correlate with paracentesis results of 1 day prior. 2. Sigmoid wall thickening which could represent the site of primary neoplasm. Pericolonic complex fluid collections could represent developing abscesses, and were detailed on the prior pelvic MRI. 3. No bowel obstruction or other acute complication. 4. New small bilateral pleural effusions with adjacent atelectasis or infection. 5. Apparent gastric wall thickening, at least partially felt to be due to underdistention. Electronically Signed   By: Abigail Miyamoto M.D.   On: 09/30/2020 14:22   US BIOPSY (LIVER)  Result Date: 10/01/2020 INDICATION: 50 year old female with a history of liver masses, known inflammatory bowel disease, admitted for sepsis. EXAM: ULTRASOUND-GUIDED LIVER MASS BIOPSY ULTRASOUND-GUIDED LIVER MASS CULTURE/ASPIRATION MEDICATIONS: None. ANESTHESIA/SEDATION: Moderate (conscious) sedation was  employed during this procedure. A total of Versed 2.0 mg and Fentanyl 75 mcg was administered intravenously. Moderate Sedation Time: 14 minutes. The patient's level of consciousness and vital signs were monitored continuously by radiology nursing throughout the procedure under my direct supervision. FLUOROSCOPY TIME:  Ultrasound COMPLICATIONS: None PROCEDURE: Informed written consent was obtained from the patient after a thorough discussion of the procedural risks, benefits and alternatives. All questions were addressed. Maximal Sterile Barrier Technique was utilized including caps, mask, sterile gowns, sterile gloves, sterile drape, hand hygiene and skin antiseptic. A timeout was performed prior to the initiation of the procedure. Ultrasound survey of the right liver lobe performed with images stored and sent to PACs. The subxiphoid region was prepped with chlorhexidine in a sterile fashion, and a sterile drape was applied covering the operative field. A sterile gown and sterile gloves were used for the procedure. Local anesthesia was provided with 1% Lidocaine. The patient was prepped and draped sterilely and the skin and subcutaneous tissues were generously infiltrated with 1% lidocaine. A 17 gauge introducer needle was then advanced under ultrasound guidance in subxiphoid location into the left liver, targeting the heterogeneous mass in the posterior left liver. The stylet was removed, and multiple separate 18 gauge core biopsy were retrieved. Samples were placed into both formalin solution, as well as sterile saline solution. The needle was then aspirated upon withdrawal for a liquid culture to be sent to the lab. Final ultrasound image was performed. The patient tolerated the procedure well and remained hemodynamically stable throughout. No complications were encountered and no significant blood loss was encounter. FINDINGS: Ultrasound demonstrates solid appearance of both right liver mass and left liver mass,  with no ultrasound correlate of what appears to be some septations and fluid on the prior MR and CT imaging. IMPRESSION: Status post ultrasound-guided left liver mass biopsy and liver mass culture/aspiration, as diagnostic test for possible metastatic lesion versus infection Signed, Dulcy Fanny. Dellia Nims, RPVI [facility] Electronically Signed   By: Corrie Mckusick D.O.   On: 10/01/2020 10:08      Assessment & Recommendations  50 year old with colitis (confirmed by colonoscopy in April 2022) admitted with abdominal pain, nausea and vomiting, acute kidney injury, abnormal GI imaging including concern for metastatic malignancy versus infectious/abscesses  1.  Abnormal abdominal imaging (solid liver lesions concerning for metastatic malignancy/intra-abdominal fluid collections concerning for abscesses versus malignancy)/group F strep peritonitis and bacteremia/history of inflammatory bowel disease/colitis --IR guided liver biopsy today --Her ascitic fluid cytology was negative for malignancy; all of her tumor markers were negative; I spoke with cytology and the fluid cannot be sent for flow cytometry though she reports the pathologist did not have suspicion of lymphoproliferative malignancy based on the fact her ascitic fluid was mostly neutrophilic infiltrate --Group F strep peritonitis and now bacteremia --intermediate sensitivity to penicillin and so Dr. Starla Link and I discussed changing antibiotics; he has changed Zosyn to levofloxacin and metronidazole --Overall picture is confusing given infectious findings but also solid tumors in the liver. --Await pathology results from today's biopsy --Continue newly changed antibiotics --White blood cell count down slightly, CRP remains elevated but also downtrending --We will follow      LOS: 5 days   Jerene Bears  10/01/2020, 11:46 AM See Shea Evans, [facility], to contact our on call provider

## 2020-10-01 NOTE — Progress Notes (Signed)
Patient ID: Nicole Browning, female   DOB: November 13, 1970, 50 y.o.   MRN: [id number]  PROGRESS NOTE    Nicole Browning  OMV:[id number] DOB: 1970-07-19 DOA: 09/25/2020 PCP: Kerin Perna, NP   Brief Narrative:  50 year old female with recent diagnosis of IBD in April 2022 presented with abdominal pain, nausea and vomiting.  On presentation, she was slightly hypotensive which improved with IV fluids.  Creatinine was elevated at 2.54 along with AST of 133, ALT of 231 and total bilirubin of 0.8.  CT of the abdomen and pelvis showed features concerning for sigmoid mass with possible hepatic metastasis.  She was given a dose of IV Rocephin in the ED for possible UTI.  GI was consulted.  Assessment & Plan:   Acute renal failure -Possibly prerenal from dehydration and poor oral intake.   -Presented with creatinine of 2.54; prior creatinine was 0.79 -improving with iv fluids: 0.75 today.  Continue IV fluids at 50 cc an hour.  Oral intake is still not that good.  Possible sigmoid mass with possible hepatic metastases versus abscesses Elevated LFTs -LFTs improving -GI following.  MRI of abdomen and pelvis showed sigmoid mass with air-fluid level along with separate hyperintense mass in the posterior left pelvis adherent to sigmoid serosa; this could be peritoneal metastatic disease versus pelvic abscesses and fistulous communication to the sigmoid colon could not be excluded.  2 liver masses were also identified, metastatic disease versus abscesses.  CT chest showed subcarinal lymph node with concern for metastatic disease and distal esophageal wall thickening. -Currently on Zosyn.  CRP still elevated but slightly improving today. -Status post ultrasound-guided paracentesis on 09/19/2020: Small amount of perihepatic ascites was found in 100 cc fluid removed.  IR wanted to wait on culture and cytology reports and a few days of IV antibiotics prior to doing liver biopsy -General surgery also evaluated the  patient and recommended no surgical intervention  -Status post EGD on 09/29/2020 which showed erosive gastritis.   -CT abdomen and pelvis with p.o. and IV contrast on 09/30/2020 showed the mass is again.  IR has been consulted for liver biopsy.  Possible liver biopsy today  Leukocytosis -Slightly improving.  Monitor  Recently diagnosed IBD -On Lialda.  Iron deficiency anemia -Hemoglobin stable.  Monitor  Hyponatremia -Mild.  IV fluids plan as above.  Signs of sacroiliitis -Seen on CAT scan.  Hypoalbuminemia Severe malnutrition -From recent poor oral intake.  Monitor -Follow nutrition recommendations    DVT prophylaxis: SCDs. Code Status: Full Family Communication: None at bedside Disposition Plan: Status is: Inpatient because: Inpatient level of care appropriate due to severity of illness  Dispo: The patient is from: Home              Anticipated d/c is to: Home              Patient currently is not medically stable to d/c.   Difficult to place patient No   Consultants: GI/IR/general surgery  Procedures: ultrasound-guided paracentesis on 09/19/2020: Small amount of perihepatic ascites was found in 100 cc fluid removed EGD on 09/29/2020 Impression:               - Minimal hiatal hernia.                           - Erosive gastritis. Recommendation:           - Return patient to hospital ward for ongoing care.                           -  Resume previous diet.                           - Continue present medications.                           - Await pathology results.                           - Would proceed with CT Abdo/pelvis with p.o. and                           IV contrast in AM. If still abn, would need IR                           guided biopsy. For now we will continue Zosyn.                           - The findings and recommendations were discussed                           with the patient. Antimicrobials:  Anti-infectives (From admission, onward)     Start     Dose/Rate Route Frequency Ordered Stop   09/27/20 0145  piperacillin-tazobactam (ZOSYN) IVPB 3.375 g        3.375 g 12.5 mL/hr over 240 Minutes Intravenous Every 8 hours 09/27/20 0057     09/26/20 2200  cefTRIAXone (ROCEPHIN) 1 g in sodium chloride 0.9 % 100 mL IVPB  Status:  Discontinued        1 g 200 mL/hr over 30 Minutes Intravenous Every 24 hours 09/25/20 2331 09/26/20 0617   09/25/20 2300  cefTRIAXone (ROCEPHIN) 1 g in sodium chloride 0.9 % 100 mL IVPB        1 g 200 mL/hr over 30 Minutes Intravenous  Once 09/25/20 2245 09/25/20 2353         Subjective: Patient seen and examined at bedside.  Complains of intermittent nausea with abdominal pain.  No fever or worsening shortness of breath reported  objective: Vitals:   09/30/20 0958 09/30/20 1840 09/30/20 2017 10/01/20 0438  BP: 121/86 117/82 122/81 124/85  Pulse: 93 (!) 108 (!) 101 (!) 109  Resp:  18 16 16   Temp: 98 F (36.7 C) 98.2 F (36.8 C) (!) 97.5 F (36.4 C) 99.7 F (37.6 C)  TempSrc: Oral  Oral Oral  SpO2: 96% 96% 97% 100%  Weight:      Height:        Intake/Output Summary (Last 24 hours) at 10/01/2020 0811 Last data filed at 10/01/2020 0600 Gross per 24 hour  Intake 1409.68 ml  Output 1900 ml  Net -490.32 ml    Filed Weights   09/26/20 2022 09/29/20 0912 09/30/20 0500  Weight: 56.7 kg 56.7 kg 56.7 kg    Examination:  General exam: No acute distress.  On room air currently. Respiratory system: Decreased breath sounds at bases bilaterally with some crackles  cardiovascular system: Tachycardic, S1-S2 heard  gastrointestinal system: Abdomen is still distended, soft and mildly tender diffusely in the lower quadrant.  Bowel sounds are heard extremities: No clubbing; mild lower extremity edema present  Central nervous system: Very slow to respond to questions; awake no focal  neurological deficits.  Moves extremities skin: No obvious ecchymosis/rashes psychiatry: Very poor historian.  Flat  affect  Data Reviewed: I have personally reviewed following labs and imaging studies  CBC: Recent Labs  Lab 09/26/20 0350 09/27/20 0343 09/28/20 0348 09/29/20 0224 09/30/20 0239 10/01/20 0008  WBC 12.7* 10.9* 11.5* 16.7* 20.0* 18.2*  NEUTROABS 10.6* 8.3* 8.1* 12.0* 16.4*  --   HGB 9.7* 8.5* 8.4* 8.2* 8.1* 7.8*  HCT 29.4* 25.4* 25.4* 24.4* 24.2* 24.1*  MCV 83.5 80.1 81.7 81.3 81.8 82.8  PLT 240 283 322 353 397 497*    Basic Metabolic Panel: Recent Labs  Lab 09/27/20 0343 09/28/20 0348 09/29/20 0224 09/30/20 0239 10/01/20 0008  NA 131* 135 136 134* 134*  K 3.3* 4.0 4.1 4.5 4.3  CL 98 101 103 101 101  CO2 25 28 26 27 27   GLUCOSE 119* 115* 110* 151* 130*  BUN 10 8 5* 6 <5*  CREATININE 0.78 0.75 0.66 0.76 0.69  CALCIUM 8.3* 8.8* 8.7* 8.8* 8.7*  MG 2.2 1.8 1.7 1.7 1.9    GFR: Estimated Creatinine Clearance: 72.6 mL/min (by C-G formula based on SCr of 0.69 mg/dL). Liver Function Tests: Recent Labs  Lab 09/27/20 0343 09/28/20 0348 09/29/20 0224 09/30/20 0239 10/01/20 0008  AST 55* 41 35 28 26  ALT 131* 99* 77* 63* 56*  ALKPHOS 60 73 71 75 76  BILITOT 0.7 0.8 0.9 0.5 0.3  PROT 5.8* 5.8* 5.7* 5.9* 6.3*  ALBUMIN 1.8* 1.7* 1.6* 1.6* 1.7*    Recent Labs  Lab 09/25/20 1641  LIPASE 22    No results for input(s): AMMONIA in the last 168 hours. Coagulation Profile: Recent Labs  Lab 09/27/20 0725 10/01/20 0008  INR 1.2 1.2    Cardiac Enzymes: No results for input(s): CKTOTAL, CKMB, CKMBINDEX, TROPONINI in the last 168 hours. BNP (last 3 results) No results for input(s): PROBNP in the last 8760 hours. HbA1C: No results for input(s): HGBA1C in the last 72 hours. CBG: No results for input(s): GLUCAP in the last 168 hours. Lipid Profile: No results for input(s): CHOL, HDL, LDLCALC, TRIG, CHOLHDL, LDLDIRECT in the last 72 hours. Thyroid Function Tests: No results for input(s): TSH, T4TOTAL, FREET4, T3FREE, THYROIDAB in the last 72 hours. Anemia Panel: No  results for input(s): VITAMINB12, FOLATE, FERRITIN, TIBC, IRON, RETICCTPCT in the last 72 hours. Sepsis Labs: Recent Labs  Lab 09/26/20 2140  PROCALCITON 15.92  LATICACIDVEN 1.1     Recent Results (from the past 240 hour(s))  Urine Culture     Status: Abnormal   Collection Time: 09/25/20 10:01 PM   Specimen: Urine, Clean Catch  Result Value Ref Range Status   Specimen Description URINE, CLEAN CATCH  Final   Special Requests   Final    NONE Performed at [facility], [place] [address] [place], [place] [address]    Culture MULTIPLE SPECIES PRESENT, SUGGEST RECOLLECTION (A)  Final   Report Status 09/27/2020 FINAL  Final  Resp Panel by RT-PCR (Flu A&B, Covid) Nasopharyngeal Swab     Status: None   Collection Time: 09/25/20 10:09 PM   Specimen: Nasopharyngeal Swab; Nasopharyngeal(NP) swabs in vial transport medium  Result Value Ref Range Status   SARS Coronavirus 2 by RT PCR NEGATIVE NEGATIVE Final    Comment: (NOTE) SARS-CoV-2 target nucleic acids are NOT DETECTED.  The SARS-CoV-2 RNA is generally detectable in upper respiratory specimens during the acute phase of infection. The lowest concentration of SARS-CoV-2 viral copies this assay can detect is  138 copies/mL. A negative result does not preclude SARS-Cov-2 infection and should not be used as the sole basis for treatment or other patient management decisions. A negative result may occur with  improper specimen collection/handling, submission of specimen other than nasopharyngeal swab, presence of viral mutation(s) within the areas targeted by this assay, and inadequate number of viral copies(<138 copies/mL). A negative result must be combined with clinical observations, patient history, and epidemiological information. The expected result is Negative.  Fact Sheet for Patients:  EntrepreneurPulse.com.au  Fact Sheet for Healthcare Providers:  IncredibleEmployment.be  This test is  no t yet approved or cleared by the [place] FDA and  has been authorized for detection and/or diagnosis of SARS-CoV-2 by FDA under an Emergency Use Authorization (EUA). This EUA will remain  in effect (meaning this test can be used) for the duration of the COVID-19 declaration under Section 564(b)(1) of the Act, 21 U.S.C.section 360bbb-3(b)(1), unless the authorization is terminated  or revoked sooner.       Influenza A by PCR NEGATIVE NEGATIVE Final   Influenza B by PCR NEGATIVE NEGATIVE Final    Comment: (NOTE) The Xpert Xpress SARS-CoV-2/FLU/RSV plus assay is intended as an aid in the diagnosis of influenza from Nasopharyngeal swab specimens and should not be used as a sole basis for treatment. Nasal washings and aspirates are unacceptable for Xpert Xpress SARS-CoV-2/FLU/RSV testing.  Fact Sheet for Patients: EntrepreneurPulse.com.au  Fact Sheet for Healthcare Providers: IncredibleEmployment.be  This test is not yet approved or cleared by the [place] FDA and has been authorized for detection and/or diagnosis of SARS-CoV-2 by FDA under an Emergency Use Authorization (EUA). This EUA will remain in effect (meaning this test can be used) for the duration of the COVID-19 declaration under Section 564(b)(1) of the Act, 21 U.S.C. section 360bbb-3(b)(1), unless the authorization is terminated or revoked.  Performed at [facility], [place] [address], [place], [place] [address]   Culture, blood (routine x 2)     Status: None (Preliminary result)   Collection Time: 09/26/20  9:40 PM   Specimen: BLOOD  Result Value Ref Range Status   Specimen Description BLOOD SITE NOT SPECIFIED  Final   Special Requests   Final    BOTTLES DRAWN AEROBIC AND ANAEROBIC Blood Culture adequate volume   Culture   Final    NO GROWTH 4 DAYS Performed at [facility], [address] [place], [place] [address]    Report Status PENDING  Incomplete   Culture, blood (routine x 2)     Status: None (Preliminary result)   Collection Time: 09/26/20  9:46 PM   Specimen: BLOOD  Result Value Ref Range Status   Specimen Description BLOOD LEFT ANTECUBITAL  Final   Special Requests   Final    BOTTLES DRAWN AEROBIC AND ANAEROBIC Blood Culture adequate volume   Culture   Final    NO GROWTH 4 DAYS Performed at [facility], [place] [address], [place], [place] [address]    Report Status PENDING  Incomplete  Body fluid culture w Gram Stain     Status: None (Preliminary result)   Collection Time: 09/27/20 11:30 AM   Specimen: Peritoneal Washings; Peritoneal Fluid  Result Value Ref Range Status   Specimen Description PERITONEAL FLUID  Final   Special Requests NONE  Final   Gram Stain   Final    ABUNDANT WBC PRESENT, PREDOMINANTLY PMN NO ORGANISMS SEEN    Culture   Final  MODERATE STREPTOCOCCUS CONSTELLATUS REPEATING SUSCEPTIBILITIES Performed at [facility], [place] [address], [place], [place] [address]    Report Status PENDING  Incomplete          Radiology Studies: CT ABDOMEN PELVIS W CONTRAST  Result Date: 09/30/2020 CLINICAL DATA:  Chronic abdominal pain. Prior CT suspicious for sigmoid neoplasm. History of Crohn disease. EXAM: CT ABDOMEN AND PELVIS WITH CONTRAST TECHNIQUE: Multidetector CT imaging of the abdomen and pelvis was performed using the standard protocol following bolus administration of intravenous contrast. CONTRAST:  141m OMNIPAQUE IOHEXOL 300 MG/ML  SOLN COMPARISON:  Abdominopelvic CT of 09/25/2020. Pelvic abdominopelvic MRI 09/26/2020. FINDINGS: Lower chest: Worsened bibasilar aeration with development of right greater than left base airspace disease. Normal heart size with new tiny bilateral pleural effusions. Hepatobiliary: Capsular based masses within the liver may be parenchymal or peritoneal. Example within the caudate lobe at 6.3 x 6.1 cm on 22/3 and the posterior aspect of segment 2-3 at 6.0 x 4.3 cm on  27/3. Gallbladder wall thickening is nonspecific. No calcified stone or biliary duct dilatation. Pancreas: Normal, without mass or ductal dilatation. Spleen: Normal in size, without focal abnormality. Adrenals/Urinary Tract: Normal adrenal glands. Left renal sinus cyst an interpolar too small to characterize lesion. Normal right kidney. Normal urinary bladder. Stomach/Bowel: Portions of the stomach are decompressed and therefore likely secondarily appear thick walled.The sigmoid is moderately thick walled, including on 71/3. Normal terminal ileum and appendix. Normal small bowel caliber. No complicating obstruction. Vascular/Lymphatic: Aortic atherosclerosis. No abdominopelvic adenopathy. Porta hepatis nodes of up to 10 mm are not pathologic by size criteria. Reproductive: Normal uterus and adnexa. Other: Development of small volume ascites with loculated fluid collections since 09/25/2020. Example collection with peritoneal thickening and hyperenhancement within the anterior abdomen at 13.8 x 4.3 cm on 42/3. A complex fluid and gas collection cephalad to the thickened sigmoid measures 3.7 x 2.8 cm on 67/3 and is similar to 09/25/2020. Inferior to this thickened sigmoid is a fluid collection as detailed on recent MRI with minimal gas within. Example 3.1 x 2.4 cm on 74/3. No free intraperitoneal air. Presumably iatrogenic nodularity about the anterior abdominal wall. Musculoskeletal: Right sacroiliac joint sclerosis suggest sacroiliitis. IMPRESSION: 1. Findings which are again suspicious for metastatic disease to the peritoneum and possibly hepatic parenchyma. Dominant capsular based liver masses have been detailed previously. A new loculated fluid collection within the anterior abdomen with peritoneal thickening/enhancement. Differential considerations include infected ascites/developing abscess or peritoneal carcinomatosis. Correlate with paracentesis results of 1 day prior. 2. Sigmoid wall thickening which could  represent the site of primary neoplasm. Pericolonic complex fluid collections could represent developing abscesses, and were detailed on the prior pelvic MRI. 3. No bowel obstruction or other acute complication. 4. New small bilateral pleural effusions with adjacent atelectasis or infection. 5. Apparent gastric wall thickening, at least partially felt to be due to underdistention. Electronically Signed   By: KAbigail MiyamotoM.D.   On: 09/30/2020 14:22        Scheduled Meds:  feeding supplement  237 mL Oral TID BM   mesalamine  1.2 g Oral BID   multivitamin with minerals  1 tablet Oral Daily   Continuous Infusions:  sodium chloride 250 mL (09/30/20 2144)   0.9 % NaCl with KCl 40 mEq / L 50 mL/hr at 10/01/20 0648   piperacillin-tazobactam (ZOSYN)  IV 3.375 g (10/01/20 0532)          KAline August MD [facility] 10/01/2020, 8:11 AM

## 2020-10-01 NOTE — Procedures (Signed)
Interventional Radiology Procedure Note  Procedure: US guided biopsy and aspiration, of liver mass, left liver Complications: None EBL: None Findings: US shows solid appearing mass left and right liver.  No significant fluid component.   Recommendations: - Bedrest 2 hours.   - Routine wound care - OK to advance diet. - OK for DC in 2 hours, if desired, when goals met - Follow up pathology/culture    Signed,  Corrie Mckusick, DO

## 2020-10-01 NOTE — Progress Notes (Signed)
Went by to see patient but she was down in IR getting her liver biopsy.  Agree with outlined planned by GI over weekend.  Await liver bx results as this may given answer to pathology.  If not, could consider repeat flex sig.  Still nothing surgical to offer at this point as would leave surgical biopsy as last resort.  Will see tomorrow.  Henreitta Cea 9:45 AM 10/01/2020

## 2020-10-01 NOTE — Progress Notes (Signed)
CRITICAL VALUE STICKER  CRITICAL VALUE: Body fluid culture w Gram Stain Stretococcus Group F  RECEIVER (on-site recipient of call): E. Makell Cyr  DATE & TIME NOTIFIED: 10/01/2020 10:26  MESSENGER (representative from lab): Donah Driver  MD NOTIFIED: Aline August, MD  TIME OF NOTIFICATION: 10:36  RESPONSE: no new orders

## 2020-10-02 DIAGNOSIS — K750 Abscess of liver: Secondary | ICD-10-CM

## 2020-10-02 DIAGNOSIS — N179 Acute kidney failure, unspecified: Secondary | ICD-10-CM | POA: Diagnosis not present

## 2020-10-02 DIAGNOSIS — K67 Disorders of peritoneum in infectious diseases classified elsewhere: Secondary | ICD-10-CM

## 2020-10-02 DIAGNOSIS — K769 Liver disease, unspecified: Secondary | ICD-10-CM | POA: Diagnosis not present

## 2020-10-02 DIAGNOSIS — K6389 Other specified diseases of intestine: Secondary | ICD-10-CM | POA: Diagnosis not present

## 2020-10-02 LAB — CBC WITH DIFFERENTIAL/PLATELET
Abs Immature Granulocytes: 0 10*3/uL (ref 0.00–0.07)
Basophils Absolute: 0 10*3/uL (ref 0.0–0.1)
Basophils Relative: 0 %
Eosinophils Absolute: 0 10*3/uL (ref 0.0–0.5)
Eosinophils Relative: 0 %
HCT: 22.4 % — ABNORMAL LOW (ref 36.0–46.0)
Hemoglobin: 7.3 g/dL — ABNORMAL LOW (ref 12.0–15.0)
Lymphocytes Relative: 10 %
Lymphs Abs: 1.6 10*3/uL (ref 0.7–4.0)
MCH: 27.2 pg (ref 26.0–34.0)
MCHC: 32.6 g/dL (ref 30.0–36.0)
MCV: 83.6 fL (ref 80.0–100.0)
Monocytes Absolute: 0.6 10*3/uL (ref 0.1–1.0)
Monocytes Relative: 4 %
Neutro Abs: 13.3 10*3/uL — ABNORMAL HIGH (ref 1.7–7.7)
Neutrophils Relative %: 86 %
Platelets: 450 10*3/uL — ABNORMAL HIGH (ref 150–400)
RBC: 2.68 MIL/uL — ABNORMAL LOW (ref 3.87–5.11)
RDW: 15.9 % — ABNORMAL HIGH (ref 11.5–15.5)
WBC: 15.5 10*3/uL — ABNORMAL HIGH (ref 4.0–10.5)
nRBC: 0 % (ref 0.0–0.2)
nRBC: 2 /100 WBC — ABNORMAL HIGH

## 2020-10-02 LAB — COMPREHENSIVE METABOLIC PANEL
ALT: 54 U/L — ABNORMAL HIGH (ref 0–44)
AST: 34 U/L (ref 15–41)
Albumin: 1.7 g/dL — ABNORMAL LOW (ref 3.5–5.0)
Alkaline Phosphatase: 58 U/L (ref 38–126)
Anion gap: 9 (ref 5–15)
BUN: <5 mg/dL — ABNORMAL LOW (ref 6–20)
CO2: 24 mmol/L (ref 22–32)
Calcium: 8.6 mg/dL — ABNORMAL LOW (ref 8.9–10.3)
Chloride: 100 mmol/L (ref 98–111)
Creatinine, Ser: 0.66 mg/dL (ref 0.44–1.00)
GFR, Estimated: >60 mL/min (ref >60)
Glucose, Bld: 97 mg/dL (ref 70–99)
Potassium: 4.3 mmol/L (ref 3.5–5.1)
Sodium: 133 mmol/L — ABNORMAL LOW (ref 135–145)
Total Bilirubin: 0.9 mg/dL (ref 0.3–1.2)
Total Protein: 6.3 g/dL — ABNORMAL LOW (ref 6.5–8.1)

## 2020-10-02 LAB — SURGICAL PATHOLOGY

## 2020-10-02 LAB — C-REACTIVE PROTEIN: CRP: 19.3 mg/dL — ABNORMAL HIGH (ref <1.0)

## 2020-10-02 LAB — MAGNESIUM: Magnesium: 1.7 mg/dL (ref 1.7–2.4)

## 2020-10-02 MED ORDER — PANTOPRAZOLE SODIUM 40 MG PO TBEC
40.0000 mg | DELAYED_RELEASE_TABLET | Freq: Every day | ORAL | Status: DC
  Administered 2020-10-02 – 2020-10-05 (×4): 40 mg via ORAL
  Filled 2020-10-02 (×4): qty 1

## 2020-10-02 MED ORDER — SODIUM CHLORIDE 0.9 % IV SOLN
2.0000 g | INTRAVENOUS | Status: DC
  Administered 2020-10-02 – 2020-10-04 (×3): 2 g via INTRAVENOUS
  Filled 2020-10-02 (×3): qty 20

## 2020-10-02 NOTE — Plan of Care (Signed)
  Problem: Education: Goal: Knowledge of General Education information will improve Description Including pain rating scale, medication(s)/side effects and non-pharmacologic comfort measures Outcome: Progressing   

## 2020-10-02 NOTE — Consult Note (Addendum)
[facility]                                                                                        Patient Identification: Patient Name: Nicole Browning MRN: [id number] [place] Date: 09/25/2020  4:35 PM Today's Date: 10/02/2020 Reason for consult: abdominal abscesses  Requesting provider: Aline August  Principal Problem:   ARF (acute renal failure) ([place]) Active Problems:   IDA (iron deficiency anemia)   IBD (inflammatory bowel disease)   Colonic mass   Abdominal pain   Protein-calorie malnutrition, severe   Lesion of colon   Liver abscess   Liver lesion   Antibiotics:  Levofloxacin 8/1 - c Metronidazole 8/1-c  Pip/tazo 7/27 - 7/31 ceftriaxone 7/26  Lines/Tubes: PIV  Assessment Hepatic masses ( Liver abscesses VS liver abscess + malignancy) Peritoneal thickening with ascites concerning for infectious peritonitis vs peritoneal carcinomatosis Sigmoidal wall thickening concerning for neoplasm  Multiple intrabdomino-pelvic fluid collection concerning for abscesses  H/o perirectal abscesses   Paracentesis 7/28 Streptococcus F , no malignant cells  Liver biopsy 8/1 no organisms in gram stain and no growth in cultures. Path: Liver parenchyma with necrotizing acute inflammation. The findings are suggestive of an abscess.  Though evidence of  malignancy is not seen in the submitted biopsies, differential diagnosis  can include necrotic tumor with secondary inflammation.  Clinical and  endoscopic correlation is suggested.  Blood cultures 09/26/20 no growth in 2/2 sets  TTE 10/01/20 with no vegetations  Recently diagnosed IBD: On mesalamine; GI following  Sacro-iliitis in CT abd/pelvis  Recommendations  Will switch antibiotics to ceftriaxone and metronidazole given patient will most likely need a long duration of antibiotics without surgical intervention + avoiding fluoroquinolones for long term  use ? Possible drainage of multiple intra-abdominal  fluid collections by IR for help with source control Follow up cultures Following GI and surgery recs, potential plan for repeating flexible sigmoidoscopy  Discussed with primary/GI and ID pharmacy   Rest of the management as per the primary team. Please call with questions or concerns.  Thank you for the consult  [name], MD Infectious Disease Physician [facility] [address] [place], [place] [address] Phone: [phone]  Fax: [phone]  __________________________________________________________________________________________________________ HPI and Hospital Course: 50 year old female with PMH of recently diagnosed IBD during colonoscopy in April 2022 who presented to the ED on 7/26 with complaint of nausea, vomiting and abdominal pain for 4 days.  He was not able to keep anything down in her stomach.  At ED, she was afebrile, WBC up to 15.  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  Addended report; differential for the liver masses includes liver abscesses in addition to metastatic disease/infectious peritonitis in addition to carcinomatosis  MR pelvis 09/26/20 thick-walled 4.3 into 2.9 to 2.8 cm collection adherent to the proximal sigmoid serosa in the left anterior pelvis with air-fluid level.   Separate 3.0 into 2.1 into 2.5 cm T2 hyperintense mass in the posterior left pelvis adherent to the sigmoid serosa with suggestion of a tiny amount of nondependent gas.  Separate 2.2 cm T2 hyperintense mass in the sigmoid mesentery.  These findings are indeterminate with differential including gas containing pelvic abscesses versus peritoneal metastatic disease of uncertain primary.  Fistulous communication to the sigmoid not excluded.  Mild thickening of the bilateral pelvic peritoneum, differential includes peritonitis versus peritoneal carcinomatosis  Patient underwent ultrasound-guided paracentesis on 7/28.  No cell count was sent.  Cultures are growing  Streptococcus group F.   CT abdomen pelvis 7/31 concerning for metastatic disease of the peritoneum and possibly hepatic parenchyma. Dominant capsular based liver masses.  A new loculated fluid collection within the anterior abdomen and peritoneal thickening enhancement.  Differential considerations include infected ascites developing abscess or peritoneal carcinomatosis.  Sigmoidal wall thickening which could represent the site of primary neoplasm pericolonic complex with collection could represent developing abscesses.   She denies any fevers, chills and sweats. Denies cough, chest pain and SOB. Denies vomiting, constipation or diarrhea PTA>   ROS: General- Denies fever, chills, loss of appetite and loss of weight HEENT - Denies headache, blurry vision, neck pain, sinus pain Chest - Denies any chest pain, SOB or cough CVS- Denies any dizziness/lightheadedness, syncopal attacks, palpitations Abdomen- Denies any vomiting, hematochezia and diarrhea: Nausea + and abdominal pain + Neuro - Denies any weakness, numbness, tingling sensation Psych - Denies any changes in mood irritability or depressive symptoms GU- Denies any burning, dysuria, hematuria or increased frequency of urination Skin - denies any rashes/lesions MSK - denies any joint  pain/swelling or restricted ROM   Past Medical History:  Diagnosis Date   Anemia    GERD (gastroesophageal reflux disease)    IBS (irritable bowel syndrome)    Perirectal abscess    Protein-calorie malnutrition ([place]) 02/29/2016   Tobacco use    UC (ulcerative colitis) ([place])    Past Surgical History:  Procedure Laterality Date   BIOPSY  09/29/2020   Procedure: BIOPSY;  Surgeon: Jackquline Denmark, MD;  Location: [facility] ENDOSCOPY;  Service: Endoscopy;;   ESOPHAGOGASTRODUODENOSCOPY (EGD) WITH PROPOFOL N/A 09/29/2020   Procedure: ESOPHAGOGASTRODUODENOSCOPY (EGD) WITH PROPOFOL;  Surgeon: Jackquline Denmark, MD;  Location[facility] ENDOSCOPY;  Service: Endoscopy;  Laterality: N/A;   IRRIGATION AND DEBRIDEMENT ABSCESS N/A 02/24/2016   Procedure: IRRIGATION AND DEBRIDEMENT ABSCESS;  Surgeon: Stark Klein, MD;  Location: [place];  Service: General;  Laterality: N/A;     Scheduled Meds:  feeding supplement  237 mL Oral TID BM   mesalamine  1.2 g Oral BID   multivitamin with minerals  1 tablet Oral Daily   pantoprazole  40 mg Oral Daily   Continuous Infusions:  sodium chloride 10 mL/hr at 10/02/20 1059   levofloxacin (LEVAQUIN) IV 750 mg (10/01/20 1752)   metronidazole Stopped (10/02/20 1004)   PRN Meds:.sodium chloride, morphine injection, ondansetron **OR** ondansetron (ZOFRAN) IV  Allergies  Allergen Reactions   Bactrim [Sulfamethoxazole-Trimethoprim] Hives    Immediate body wide hives   Percocet [Oxycodone-Acetaminophen] Nausea Only   Valium [Diazepam] Nausea Only   Social History   Socioeconomic History   Marital status: Single  Spouse name: Not on file   Number of children: Not on file   Years of education: Not on file   Highest education level: Not on file  Occupational History   Not on file  Tobacco Use   Smoking status: Every Day    Packs/day: 0.33    Years: 28.00    Pack years: 9.24    Types: Cigarettes   Smokeless tobacco: Never  Vaping Use   Vaping Use: Never used  Substance  and Sexual Activity   Alcohol use: Yes    Alcohol/week: 2.0 standard drinks    Types: 2 Glasses of wine per week   Drug use: No   Sexual activity: Not Currently    Birth control/protection: Injection  Other Topics Concern   Not on file  Social History Narrative   Not on file   Social Determinants of Health   Financial Resource Strain: Not on file  Food Insecurity: Not on file  Transportation Needs: Not on file  Physical Activity: Not on file  Stress: Not on file  Social Connections: Not on file  Intimate Partner Violence: Not on file   Family History  Problem Relation Age of Onset   Diabetes Mother    COPD Father    Leukemia Brother    Colon cancer Neg Hx    Colon polyps Neg Hx    Esophageal cancer Neg Hx    Rectal cancer Neg Hx    Stomach cancer Neg Hx    Vitals BP 131/89 (BP Location: Left Arm)   Pulse 100   Temp 98 F (36.7 C) (Oral)   Resp 18   Ht 5' 4"  (1.626 m)   Wt 59.3 kg   LMP  (LMP Unknown)   SpO2 100%   BMI 22.44 kg/m    Physical Exam Constitutional:  thin lady lying in bed, no acute distress     Comments:   Cardiovascular:     Rate and Rhythm: Normal rate and regular rhythm.     Heart sounds:    Pulmonary:     Effort: Pulmonary effort is normal.     Comments:   Abdominal:     Palpations: Abdomen is soft.     Tenderness: distended and tender in the lower abdomen, no RT and guarding   Musculoskeletal:        General: No swelling or tenderness.   Skin:    Comments: No lesions or rashes   Neurological:     General: No focal deficit present.   Psychiatric:        Mood and Affect: Mood normal.    Pertinent Microbiology Results for orders placed or performed during the hospital encounter of 09/25/20  Urine Culture     Status: Abnormal   Collection Time: 09/25/20 10:01 PM   Specimen: Urine, Clean Catch  Result Value Ref Range Status   Specimen Description URINE, CLEAN CATCH  Final   Special Requests   Final    NONE Performed at  [facility], [place] [address], [place], [place] [address]    Culture MULTIPLE SPECIES PRESENT, SUGGEST RECOLLECTION (A)  Final   Report Status 09/27/2020 FINAL  Final  Resp Panel by RT-PCR (Flu A&B, Covid) Nasopharyngeal Swab     Status: None   Collection Time: 09/25/20 10:09 PM   Specimen: Nasopharyngeal Swab; Nasopharyngeal(NP) swabs in vial transport medium  Result Value Ref Range Status   SARS Coronavirus 2 by RT PCR NEGATIVE NEGATIVE Final    Comment: (NOTE)  SARS-CoV-2 target nucleic acids are NOT DETECTED.  The SARS-CoV-2 RNA is generally detectable in upper respiratory specimens during the acute phase of infection. The lowest concentration of SARS-CoV-2 viral copies this assay can detect is 138 copies/mL. A negative result does not preclude SARS-Cov-2 infection and should not be used as the sole basis for treatment or other patient management decisions. A negative result may occur with  improper specimen collection/handling, submission of specimen other than nasopharyngeal swab, presence of viral mutation(s) within the areas targeted by this assay, and inadequate number of viral copies(<138 copies/mL). A negative result must be combined with clinical observations, patient history, and epidemiological information. The expected result is Negative.  Fact Sheet for Patients:  EntrepreneurPulse.com.au  Fact Sheet for Healthcare Providers:  IncredibleEmployment.be  This test is no t yet approved or cleared by the [place] FDA and  has been authorized for detection and/or diagnosis of SARS-CoV-2 by FDA under an Emergency Use Authorization (EUA). This EUA will remain  in effect (meaning this test can be used) for the duration of the COVID-19 declaration under Section 564(b)(1) of the Act, 21 U.S.C.section 360bbb-3(b)(1), unless the authorization is terminated  or revoked sooner.       Influenza A by PCR NEGATIVE NEGATIVE Final    Influenza B by PCR NEGATIVE NEGATIVE Final    Comment: (NOTE) The Xpert Xpress SARS-CoV-2/FLU/RSV plus assay is intended as an aid in the diagnosis of influenza from Nasopharyngeal swab specimens and should not be used as a sole basis for treatment. Nasal washings and aspirates are unacceptable for Xpert Xpress SARS-CoV-2/FLU/RSV testing.  Fact Sheet for Patients: EntrepreneurPulse.com.au  Fact Sheet for Healthcare Providers: IncredibleEmployment.be  This test is not yet approved or cleared by the [place] FDA and has been authorized for detection and/or diagnosis of SARS-CoV-2 by FDA under an Emergency Use Authorization (EUA). This EUA will remain in effect (meaning this test can be used) for the duration of the COVID-19 declaration under Section 564(b)(1) of the Act, 21 U.S.C. section 360bbb-3(b)(1), unless the authorization is terminated or revoked.  Performed at [facility], [place] [address] , [place] [address]   Culture, blood (routine x 2)     Status: None   Collection Time: 09/26/20  9:40 PM   Specimen: BLOOD  Result Value Ref Range Status   Specimen Description BLOOD SITE NOT SPECIFIED  Final   Special Requests   Final    BOTTLES DRAWN AEROBIC AND ANAEROBIC Blood Culture adequate volume   Culture   Final    NO GROWTH 5 DAYS Performed at [facility], [address], [place], [place] [address]    Report Status 10/01/2020 FINAL  Final  Culture, blood (routine x 2)     Status: None   Collection Time: 09/26/20  9:46 PM   Specimen: BLOOD  Result Value Ref Range Status   Specimen Description BLOOD LEFT ANTECUBITAL  Final   Special Requests   Final    BOTTLES DRAWN AEROBIC AND ANAEROBIC Blood Culture adequate volume   Culture   Final    NO GROWTH 5 DAYS Performed at [facility], [place] [address], [place], [place] [address]    Report Status 10/01/2020 FINAL  Final  Body fluid culture w Gram Stain      Status: None   Collection Time: 09/27/20 11:30 AM   Specimen: Peritoneal Washings; Peritoneal Fluid  Result Value Ref Range Status   Specimen Description PERITONEAL FLUID  Final  Special Requests NONE  Final   Gram Stain   Final    ABUNDANT WBC PRESENT, PREDOMINANTLY PMN NO ORGANISMS SEEN    Culture   Final    MODERATE STREPTOCOCCUS GROUP F CRITICAL RESULT CALLED TO, READ BACK BY AND VERIFIED WITH: RN E.BLUE AT 0867 ON 10/01/2020 BY T.SAAD. Performed at [facility], [place] [address] [place], [place] [address]    Report Status 10/01/2020 FINAL  Final   Organism ID, Bacteria STREPTOCOCCUS GROUP F  Final      Susceptibility   Streptococcus group f - MIC*    PENICILLIN INTERMEDIATE Intermediate     CEFTRIAXONE 1 SENSITIVE Sensitive     ERYTHROMYCIN <=0.12 SENSITIVE Sensitive     LEVOFLOXACIN 0.5 SENSITIVE Sensitive     VANCOMYCIN 0.5 SENSITIVE Sensitive     * MODERATE STREPTOCOCCUS GROUP F  Aerobic/Anaerobic Culture w Gram Stain (surgical/deep wound)     Status: None (Preliminary result)   Collection Time: 10/01/20  9:30 AM   Specimen: Tissue  Result Value Ref Range Status   Specimen Description TISSUE  Final   Special Requests LEFT LIVER MASS  Final   Gram Stain NO WBC SEEN NO ORGANISMS SEEN   Final   Culture   Final    NO GROWTH 1 DAY Performed at [facility], [place] [address], [place], [place] [address]    Report Status PENDING  Incomplete  Aerobic/Anaerobic Culture w Gram Stain (surgical/deep wound)     Status: None (Preliminary result)   Collection Time: 10/01/20  9:47 AM   Specimen: Abscess  Result Value Ref Range Status   Specimen Description ABSCESS  Final   Special Requests LEFT LIVER  Final   Gram Stain NO WBC SEEN NO ORGANISMS SEEN   Final   Culture   Final    NO GROWTH 1 DAY Performed at [facility], [place] [address], [place],  [address]    Report Status PENDING  Incomplete    Pertinent Lab seen by me: CBC Latest Ref Rng & Units  10/02/2020 10/01/2020 09/30/2020  WBC 4.0 - 10.5 K/uL 15.5(H) 18.2(H) 20.0(H)  Hemoglobin 12.0 - 15.0 g/dL 7.3(L) 7.8(L) 8.1(L)  Hematocrit 36.0 - 46.0 % 22.4(L) 24.1(L) 24.2(L)  Platelets 150 - 400 K/uL 450(H) 497(H) 397   CMP Latest Ref Rng & Units 10/02/2020 10/01/2020 09/30/2020  Glucose 70 - 99 mg/dL 97 130(H) 151(H)  BUN 6 - 20 mg/dL <5(L) <5(L) 6  Creatinine 0.44 - 1.00 mg/dL 0.66 0.69 0.76  Sodium 135 - 145 mmol/L 133(L) 134(L) 134(L)  Potassium 3.5 - 5.1 mmol/L 4.3 4.3 4.5  Chloride 98 - 111 mmol/L 100 101 101  CO2 22 - 32 mmol/L 24 27 27   Calcium 8.9 - 10.3 mg/dL 8.6(L) 8.7(L) 8.8(L)  Total Protein 6.5 - 8.1 g/dL 6.3(L) 6.3(L) 5.9(L)  Total Bilirubin 0.3 - 1.2 mg/dL 0.9 0.3 0.5  Alkaline Phos 38 - 126 U/L 58 76 75  AST 15 - 41 U/L 34 26 28  ALT 0 - 44 U/L 54(H) 56(H) 63(H)     Pertinent Imagings/Other Imagings Plain films and CT images have been personally visualized and interpreted; radiology reports have been reviewed. Decision making incorporated into the Impression / Recommendations.  CT abdomen/pelvis 09/25/20 IMPRESSION: 1. 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 Hepatic MRI or CT with contrast may be helpful as the patient is able for further evaluation. 2. Question  of gastric antral thickening.  Areas not well assessed. 3. Signs of sacroiliitis with asymmetric involvement of the RIGHT sacral iliac joint. Correlate with any clinical or laboratory evidence of seronegative spondyloarthropathy. 4. Juxta diaphragmatic airspace disease in the RIGHT chest favored to represent juxta diaphragmatic atelectasis. 5. Aortic atherosclerosis.  MR abdomen 09/26/20 IMPRESSION: 1. Limited motion degraded incomplete MRI abdomen study performed without IV contrast, discontinued prior to study completion at the patient's request. 2. Two similar indeterminate heterogeneous liver masses  measuring 5.6 cm in the caudate lobe and 5.3 cm in the posterior left liver, incompletely evaluated. Metastatic disease not excluded. Consider IR consultation for potential ultrasound-guided liver biopsy. If clinically feasible, liver protocol CT abdomen and pelvis without and with IV contrast may be considered for further evaluation prior to potential biopsy. 3. Mild generalized peritoneal thickening. Trace abdominal ascites. 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 4. Streaky curvilinear opacities at the dependent right greater than left lung bases, not well evaluated by MRI. Chest CT may be obtained for further evaluation as clinically warranted.  MR Pelvis 09/26/20 IMPRESSION: 1. Limited noncontrast MRI pelvis study, discontinued early at the patient's request. 2. Thick-walled 4.3 x 2.9 x 2.8 cm collection adherent to the proximal sigmoid serosa in the left anterior pelvis with air-fluid level. Separate 3.0 x 2.1 x 2.5 cm T2 hyperintense mass in the posterior left pelvis adherent to the sigmoid serosa with suggestion of a tiny amount of nondependent gas. Separate 2.2 cm T2 hyperintense mass in the sigmoid mesentery. These findings are indeterminate, with differential including gas-containing pelvic abscesses versus peritoneal metastatic disease of uncertain primary. Fistulous communication to the sigmoid colon not excluded. Surgical consultation suggested. 3. Mild thickening of the bilateral pelvic peritoneum, nonspecific, differential includes peritonitis vs. peritoneal carcinomatosis.  CT chest 09/27/20 IMPRESSION: Small left pleural effusion is noted. Mild bilateral posterior basilar subsegmental atelectasis or infiltrates are noted.   Possible 2 cm subcarinal lymph node is noted which may represent metastatic disease.   Wall thickening of distal esophagus is noted concerning  for esophagitis. Endoscopy is recommended for further evaluation.   Hepatic masses are again noted as described on prior CT scan, concerning for malignancy or metastatic disease.  CT abdomen/pelvis 09/30/20  IMPRESSION: 1. Findings which are again suspicious for metastatic disease to the peritoneum and possibly hepatic parenchyma. Dominant capsular based liver masses have been detailed previously. A new loculated fluid collection within the anterior abdomen with peritoneal thickening/enhancement. Differential considerations include infected ascites/developing abscess or peritoneal carcinomatosis. Correlate with paracentesis results of 1 day prior. 2. Sigmoid wall thickening which could represent the site of primary neoplasm. Pericolonic complex fluid collections could represent developing abscesses, and were detailed on the prior pelvic MRI. 3. No bowel obstruction or other acute complication. 4. New small bilateral pleural effusions with adjacent atelectasis or infection. 5. Apparent gastric wall thickening, at least partially felt to be due to underdistention.   I spent more than 70  minutes for this patient encounter including review of prior medical records/discussing diagnostics and treatment plan with the patient/family/coordinate care with primary/other specialits with greater than 50% of time in face to face encounter.   Electronically signed by:   [name], MD Infectious Disease Physician [facility] Infectious Disease Pager: [phone]

## 2020-10-02 NOTE — Progress Notes (Signed)
3 Days Post-Op  Subjective: Patient still with abdominal pain and wanting to know what's going on.  ROS: See above, otherwise other systems negative  Objective: Vital signs in last 24 hours: Temp:  [97.9 F (36.6 C)-98.8 F (37.1 C)] 98 F (36.7 C) (08/02 0631) Pulse Rate:  [96-114] 108 (08/02 0631) Resp:  [16-20] 16 (08/02 0606) BP: (126-145)/(83-92) 135/86 (08/02 0631) SpO2:  [94 %-100 %] 97 % (08/02 0631) Weight:  [59.3 kg] 59.3 kg (08/02 0606) Last BM Date: 09/28/20  Intake/Output from previous day: 08/01 0701 - 08/02 0700 In: 1950.7 [P.O.:240; I.V.:1360.7; IV Piggyback:350] Out: 800 [Urine:800] Intake/Output this shift: Total I/O In: 273.1 [P.O.:120; I.V.:153.1] Out: -   PE: Abd: soft, still distended some, +BS, still tender.  Lab Results:  Recent Labs    10/01/20 0008 10/02/20 0543  WBC 18.2* 15.5*  HGB 7.8* 7.3*  HCT 24.1* 22.4*  PLT 497* 450*   BMET Recent Labs    10/01/20 0008 10/02/20 0543  NA 134* 133*  K 4.3 4.3  CL 101 100  CO2 27 24  GLUCOSE 130* 97  BUN <5* <5*  CREATININE 0.69 0.66  CALCIUM 8.7* 8.6*   PT/INR Recent Labs    10/01/20 0008  LABPROT 15.3*  INR 1.2   CMP     Component Value Date/Time   NA 133 (L) 10/02/2020 0543   K 4.3 10/02/2020 0543   CL 100 10/02/2020 0543   CO2 24 10/02/2020 0543   GLUCOSE 97 10/02/2020 0543   BUN <5 (L) 10/02/2020 0543   CREATININE 0.66 10/02/2020 0543   CALCIUM 8.6 (L) 10/02/2020 0543   PROT 6.3 (L) 10/02/2020 0543   ALBUMIN 1.7 (L) 10/02/2020 0543   AST 34 10/02/2020 0543   ALT 54 (H) 10/02/2020 0543   ALKPHOS 58 10/02/2020 0543   BILITOT 0.9 10/02/2020 0543   GFRNONAA >60 10/02/2020 0543   GFRAA >60 02/29/2016 0426   Lipase     Component Value Date/Time   LIPASE 22 09/25/2020 1641       Studies/Results: CT ABDOMEN PELVIS W CONTRAST  Result Date: 09/30/2020 CLINICAL DATA:  Chronic abdominal pain. Prior CT suspicious for sigmoid neoplasm. History of Crohn disease.  EXAM: CT ABDOMEN AND PELVIS WITH CONTRAST TECHNIQUE: Multidetector CT imaging of the abdomen and pelvis was performed using the standard protocol following bolus administration of intravenous contrast. CONTRAST:  189m OMNIPAQUE IOHEXOL 300 MG/ML  SOLN COMPARISON:  Abdominopelvic CT of 09/25/2020. Pelvic abdominopelvic MRI 09/26/2020. FINDINGS: Lower chest: Worsened bibasilar aeration with development of right greater than left base airspace disease. Normal heart size with new tiny bilateral pleural effusions. Hepatobiliary: Capsular based masses within the liver may be parenchymal or peritoneal. Example within the caudate lobe at 6.3 x 6.1 cm on 22/3 and the posterior aspect of segment 2-3 at 6.0 x 4.3 cm on 27/3. Gallbladder wall thickening is nonspecific. No calcified stone or biliary duct dilatation. Pancreas: Normal, without mass or ductal dilatation. Spleen: Normal in size, without focal abnormality. Adrenals/Urinary Tract: Normal adrenal glands. Left renal sinus cyst an interpolar too small to characterize lesion. Normal right kidney. Normal urinary bladder. Stomach/Bowel: Portions of the stomach are decompressed and therefore likely secondarily appear thick walled.The sigmoid is moderately thick walled, including on 71/3. Normal terminal ileum and appendix. Normal small bowel caliber. No complicating obstruction. Vascular/Lymphatic: Aortic atherosclerosis. No abdominopelvic adenopathy. Porta hepatis nodes of up to 10 mm are not pathologic by size criteria. Reproductive: Normal uterus and adnexa. Other: Development of  small volume ascites with loculated fluid collections since 09/25/2020. Example collection with peritoneal thickening and hyperenhancement within the anterior abdomen at 13.8 x 4.3 cm on 42/3. A complex fluid and gas collection cephalad to the thickened sigmoid measures 3.7 x 2.8 cm on 67/3 and is similar to 09/25/2020. Inferior to this thickened sigmoid is a fluid collection as detailed on  recent MRI with minimal gas within. Example 3.1 x 2.4 cm on 74/3. No free intraperitoneal air. Presumably iatrogenic nodularity about the anterior abdominal wall. Musculoskeletal: Right sacroiliac joint sclerosis suggest sacroiliitis. IMPRESSION: 1. Findings which are again suspicious for metastatic disease to the peritoneum and possibly hepatic parenchyma. Dominant capsular based liver masses have been detailed previously. A new loculated fluid collection within the anterior abdomen with peritoneal thickening/enhancement. Differential considerations include infected ascites/developing abscess or peritoneal carcinomatosis. Correlate with paracentesis results of 1 day prior. 2. Sigmoid wall thickening which could represent the site of primary neoplasm. Pericolonic complex fluid collections could represent developing abscesses, and were detailed on the prior pelvic MRI. 3. No bowel obstruction or other acute complication. 4. New small bilateral pleural effusions with adjacent atelectasis or infection. 5. Apparent gastric wall thickening, at least partially felt to be due to underdistention. Electronically Signed   By: Abigail Miyamoto M.D.   On: 09/30/2020 14:22   US BIOPSY (LIVER)  Result Date: 10/01/2020 INDICATION: 50 year old female with a history of liver masses, known inflammatory bowel disease, admitted for sepsis. EXAM: ULTRASOUND-GUIDED LIVER MASS BIOPSY ULTRASOUND-GUIDED LIVER MASS CULTURE/ASPIRATION MEDICATIONS: None. ANESTHESIA/SEDATION: Moderate (conscious) sedation was employed during this procedure. A total of Versed 2.0 mg and Fentanyl 75 mcg was administered intravenously. Moderate Sedation Time: 14 minutes. The patient's level of consciousness and vital signs were monitored continuously by radiology nursing throughout the procedure under my direct supervision. FLUOROSCOPY TIME:  Ultrasound COMPLICATIONS: None PROCEDURE: Informed written consent was obtained from the patient after a thorough discussion  of the procedural risks, benefits and alternatives. All questions were addressed. Maximal Sterile Barrier Technique was utilized including caps, mask, sterile gowns, sterile gloves, sterile drape, hand hygiene and skin antiseptic. A timeout was performed prior to the initiation of the procedure. Ultrasound survey of the right liver lobe performed with images stored and sent to PACs. The subxiphoid region was prepped with chlorhexidine in a sterile fashion, and a sterile drape was applied covering the operative field. A sterile gown and sterile gloves were used for the procedure. Local anesthesia was provided with 1% Lidocaine. The patient was prepped and draped sterilely and the skin and subcutaneous tissues were generously infiltrated with 1% lidocaine. A 17 gauge introducer needle was then advanced under ultrasound guidance in subxiphoid location into the left liver, targeting the heterogeneous mass in the posterior left liver. The stylet was removed, and multiple separate 18 gauge core biopsy were retrieved. Samples were placed into both formalin solution, as well as sterile saline solution. The needle was then aspirated upon withdrawal for a liquid culture to be sent to the lab. Final ultrasound image was performed. The patient tolerated the procedure well and remained hemodynamically stable throughout. No complications were encountered and no significant blood loss was encounter. FINDINGS: Ultrasound demonstrates solid appearance of both right liver mass and left liver mass, with no ultrasound correlate of what appears to be some septations and fluid on the prior MR and CT imaging. IMPRESSION: Status post ultrasound-guided left liver mass biopsy and liver mass culture/aspiration, as diagnostic test for possible metastatic lesion versus infection Signed, Dulcy Fanny. Earleen Newport,  DO, RPVI [facility] Electronically Signed   By: Corrie Mckusick D.O.   On: 10/01/2020  10:08   ECHOCARDIOGRAM COMPLETE  Result Date: 10/01/2020    ECHOCARDIOGRAM REPORT   Patient Name:   Nicole Browning Date of Exam: 10/01/2020 Medical Rec #:  [id number]      Height:       64.0 in Accession #:    [id number]     Weight:       125.0 lb Date of Birth:  February 18, 1971      BSA:          1.602 m Patient Age:    50 years       BP:           126/83 mmHg Patient Gender: F              HR:           96 bpm. Exam Location:  Inpatient Procedure: 2D Echo, Cardiac Doppler and Color Doppler Indications:    Bacteremia  History:        Patient has no prior history of Echocardiogram examinations.                 Arrythmias:Tachycardia. ARF, Anemia, Liver mass.  Sonographer:    Dustin Flock Referring Phys: [id number] [place]  1. Left ventricular ejection fraction, by estimation, is 60 to 65%. The left ventricle has normal function. The left ventricle has no regional wall motion abnormalities. Left ventricular diastolic parameters were normal.  2. Right ventricular systolic function is normal. The right ventricular size is normal. There is mildly elevated pulmonary artery systolic pressure. The estimated right ventricular systolic pressure is 72.0 mmHg.  3. The mitral valve is normal in structure. No evidence of mitral valve regurgitation.  4. The aortic valve is tricuspid. Aortic valve regurgitation is not visualized. No aortic stenosis is present. FINDINGS  Left Ventricle: Left ventricular ejection fraction, by estimation, is 60 to 65%. The left ventricle has normal function. The left ventricle has no regional wall motion abnormalities. The left ventricular internal cavity size was normal in size. There is  no left ventricular hypertrophy. Left ventricular diastolic parameters were normal. Right Ventricle: The right ventricular size is normal. No increase in right ventricular wall thickness. Right ventricular systolic function is normal. There is mildly elevated pulmonary artery systolic pressure. The  tricuspid regurgitant velocity is 2.89  m/s, and with an assumed right atrial pressure of 3 mmHg, the estimated right ventricular systolic pressure is 94.7 mmHg. Left Atrium: Left atrial size was normal in size. Right Atrium: Right atrial size was normal in size. Pericardium: There is no evidence of pericardial effusion. Mitral Valve: The mitral valve is normal in structure. No evidence of mitral valve regurgitation. Tricuspid Valve: The tricuspid valve is normal in structure. Tricuspid valve regurgitation is trivial. Aortic Valve: The aortic valve is tricuspid. Aortic valve regurgitation is not visualized. No aortic stenosis is present. Pulmonic Valve: The pulmonic valve was not well visualized. Pulmonic valve regurgitation is not visualized. Aorta: The aortic root is normal in size and structure. IAS/Shunts: The interatrial septum was not well visualized.  LEFT VENTRICLE PLAX 2D LVIDd:         4.80 cm  Diastology LVIDs:         2.90 cm  LV e' medial:    7.18 cm/s LV PW:         0.80 cm  LV E/e' medial:  15.5  LV IVS:        0.80 cm  LV e' lateral:   13.60 cm/s LVOT diam:     1.90 cm  LV E/e' lateral: 8.2 LV SV:         54 LV SV Index:   34 LVOT Area:     2.84 cm  RIGHT VENTRICLE RV Basal diam:  2.60 cm RV S prime:     13.30 cm/s TAPSE (M-mode): 1.6 cm LEFT ATRIUM             Index       RIGHT ATRIUM           Index LA diam:        3.00 cm 1.87 cm/m  RA Area:     11.10 cm LA Vol (A2C):   37.9 ml 23.66 ml/m RA Volume:   22.20 ml  13.86 ml/m LA Vol (A4C):   32.7 ml 20.41 ml/m LA Biplane Vol: 35.5 ml 22.16 ml/m  AORTIC VALVE LVOT Vmax:   105.00 cm/s LVOT Vmean:  69.900 cm/s LVOT VTI:    0.190 m  AORTA Ao Root diam: 2.20 cm MITRAL VALVE                TRICUSPID VALVE MV Area (PHT): 7.29 cm     TR Peak grad:   33.4 mmHg MV Decel Time: 104 msec     TR Vmax:        289.00 cm/s MV E velocity: 111.00 cm/s MV A velocity: 91.20 cm/s   SHUNTS MV E/A ratio:  1.22         Systemic VTI:  0.19 m                              Systemic Diam: 1.90 cm Oswaldo Milian MD Electronically signed by Oswaldo Milian MD Signature Date/Time: 10/01/2020/5:11:46 PM    Final     Anti-infectives: Anti-infectives (From admission, onward)    Start     Dose/Rate Route Frequency Ordered Stop   10/01/20 1700  levofloxacin (LEVAQUIN) IVPB 750 mg        750 mg 100 mL/hr over 90 Minutes Intravenous Every 24 hours 10/01/20 1156     10/01/20 1600  metroNIDAZOLE (FLAGYL) IVPB 500 mg        500 mg 100 mL/hr over 60 Minutes Intravenous Every 8 hours 10/01/20 1156     09/27/20 0145  piperacillin-tazobactam (ZOSYN) IVPB 3.375 g  Status:  Discontinued        3.375 g 12.5 mL/hr over 240 Minutes Intravenous Every 8 hours 09/27/20 0057 10/01/20 1156   09/26/20 2200  cefTRIAXone (ROCEPHIN) 1 g in sodium chloride 0.9 % 100 mL IVPB  Status:  Discontinued        1 g 200 mL/hr over 30 Minutes Intravenous Every 24 hours 09/25/20 2331 09/26/20 0617   09/25/20 2300  cefTRIAXone (ROCEPHIN) 1 g in sodium chloride 0.9 % 100 mL IVPB        1 g 200 mL/hr over 30 Minutes Intravenous  Once 09/25/20 2245 09/25/20 2353        Assessment/Plan  Intra-abdominal masses, malignancy vs infectious -liver bx pending -no acute plans for surgical intervention at this point.  Hopefully liver biopsies will give a more definitive idea of what's going on.  FEN - regular VTE - per primary ID - levaquin/flagyl   LOS: 6 days    Henreitta Cea , PA-C  [facility] 10/02/2020, 9:08 AM Please see Amion for pager number during day hours 7:00am-4:30pm or 7:00am -11:30am on weekends

## 2020-10-02 NOTE — Anesthesia Postprocedure Evaluation (Signed)
Anesthesia Post Note  Patient: Nicole Browning  Procedure(s) Performed: ESOPHAGOGASTRODUODENOSCOPY (EGD) WITH PROPOFOL BIOPSY     Patient location during evaluation: Endoscopy Anesthesia Type: MAC Level of consciousness: patient cooperative and awake Pain management: pain level controlled Vital Signs Assessment: post-procedure vital signs reviewed and stable Respiratory status: spontaneous breathing, nonlabored ventilation, respiratory function stable and patient connected to nasal cannula oxygen Cardiovascular status: stable and blood pressure returned to baseline Postop Assessment: no apparent nausea or vomiting Anesthetic complications: no   No notable events documented.  Last Vitals:  Vitals:   10/02/20 0631 10/02/20 1108  BP: 135/86 131/89  Pulse: (!) 108 100  Resp:  18  Temp: 36.7 C 36.7 C  SpO2: 97% 100%    Last Pain:  Vitals:   10/02/20 1108  TempSrc: Oral  PainSc:                  [name]

## 2020-10-02 NOTE — Progress Notes (Signed)
Progress Note   Subjective  Patient feeling okay today, up and out of bed for shower BMs are soft, nonbloody and without diarrhea, stools more soft when she drinks ensure No nausea/vomiting Minimal abd pain today   Objective  Vital signs in last 24 hours: Temp:  [98 F (36.7 C)-98.8 F (37.1 C)] 98 F (36.7 C) (08/02 1108) Pulse Rate:  [100-114] 100 (08/02 1108) Resp:  [16-18] 18 (08/02 1108) BP: (131-138)/(86-92) 131/89 (08/02 1108) SpO2:  [97 %-100 %] 100 % (08/02 1108) Weight:  [59.3 kg] 59.3 kg (08/02 0606) Last BM Date: 09/28/20  General: Alert, well-developed, in NAD Heart:  Regular rate and rhythm; no murmurs Chest: Clear to ascultation bilaterally Abdomen:  Soft, mildly tender and distended.  Normal bowel sounds, without guarding, and without rebound.   Extremities:  Without edema. Neurologic:  Alert and  oriented x4; grossly normal neurologically. Psych:  Alert and cooperative. Normal mood and affect.  Intake/Output from previous day: 08/01 0701 - 08/02 0700 In: 1950.7 [P.O.:240; I.V.:1360.7; IV Piggyback:350] Out: 800 [Urine:800] Intake/Output this shift: Total I/O In: 506.8 [P.O.:120; I.V.:286.8; IV Piggyback:100] Out: -   Lab Results: Recent Labs    09/30/20 0239 10/01/20 0008 10/02/20 0543  WBC 20.0* 18.2* 15.5*  HGB 8.1* 7.8* 7.3*  HCT 24.2* 24.1* 22.4*  PLT 397 497* 450*   BMET Recent Labs    09/30/20 0239 10/01/20 0008 10/02/20 0543  NA 134* 134* 133*  K 4.5 4.3 4.3  CL 101 101 100  CO2 27 27 24   GLUCOSE 151* 130* 97  BUN 6 <5* <5*  CREATININE 0.76 0.69 0.66  CALCIUM 8.8* 8.7* 8.6*   LFT Recent Labs    10/02/20 0543  PROT 6.3*  ALBUMIN 1.7*  AST 34  ALT 54*  ALKPHOS 58  BILITOT 0.9   PT/INR Recent Labs    10/01/20 0008  LABPROT 15.3*  INR 1.2   Hepatitis Panel No results for input(s): HEPBSAG, HCVAB, HEPAIGM, HEPBIGM in the last 72 hours.  Studies/Results: US BIOPSY (LIVER)  Result Date:  10/01/2020 INDICATION: 50 year old female with a history of liver masses, known inflammatory bowel disease, admitted for sepsis. EXAM: ULTRASOUND-GUIDED LIVER MASS BIOPSY ULTRASOUND-GUIDED LIVER MASS CULTURE/ASPIRATION MEDICATIONS: None. ANESTHESIA/SEDATION: Moderate (conscious) sedation was employed during this procedure. A total of Versed 2.0 mg and Fentanyl 75 mcg was administered intravenously. Moderate Sedation Time: 14 minutes. The patient's level of consciousness and vital signs were monitored continuously by radiology nursing throughout the procedure under my direct supervision. FLUOROSCOPY TIME:  Ultrasound COMPLICATIONS: None PROCEDURE: Informed written consent was obtained from the patient after a thorough discussion of the procedural risks, benefits and alternatives. All questions were addressed. Maximal Sterile Barrier Technique was utilized including caps, mask, sterile gowns, sterile gloves, sterile drape, hand hygiene and skin antiseptic. A timeout was performed prior to the initiation of the procedure. Ultrasound survey of the right liver lobe performed with images stored and sent to PACs. The subxiphoid region was prepped with chlorhexidine in a sterile fashion, and a sterile drape was applied covering the operative field. A sterile gown and sterile gloves were used for the procedure. Local anesthesia was provided with 1% Lidocaine. The patient was prepped and draped sterilely and the skin and subcutaneous tissues were generously infiltrated with 1% lidocaine. A 17 gauge introducer needle was then advanced under ultrasound guidance in subxiphoid location into the left liver, targeting the heterogeneous mass in the posterior left liver. The stylet was removed, and multiple separate 18 gauge  core biopsy were retrieved. Samples were placed into both formalin solution, as well as sterile saline solution. The needle was then aspirated upon withdrawal for a liquid culture to be sent to the lab. Final  ultrasound image was performed. The patient tolerated the procedure well and remained hemodynamically stable throughout. No complications were encountered and no significant blood loss was encounter. FINDINGS: Ultrasound demonstrates solid appearance of both right liver mass and left liver mass, with no ultrasound correlate of what appears to be some septations and fluid on the prior MR and CT imaging. IMPRESSION: Status post ultrasound-guided left liver mass biopsy and liver mass culture/aspiration, as diagnostic test for possible metastatic lesion versus infection Signed, Dulcy Fanny. Dellia Nims, RPVI [facility] Electronically Signed   By: Corrie Mckusick D.O.   On: 10/01/2020 10:08   ECHOCARDIOGRAM COMPLETE  Result Date: 10/01/2020    ECHOCARDIOGRAM REPORT   Patient Name:   Nicole Browning Date of Exam: 10/01/2020 Medical Rec #:  [id number]      Height:       64.0 in Accession #:    [id number]     Weight:       125.0 lb Date of Birth:  [birth year]      BSA:          1.602 m Patient Age:    50 years       BP:           126/83 mmHg Patient Gender: F              HR:           96 bpm. Exam Location:  Inpatient Procedure: 2D Echo, Cardiac Doppler and Color Doppler Indications:    Bacteremia  History:        Patient has no prior history of Echocardiogram examinations.                 Arrythmias:Tachycardia. ARF, Anemia, Liver mass.  Sonographer:    Dustin Flock Referring Phys: [id number] [place]  1. Left ventricular ejection fraction, by estimation, is 60 to 65%. The left ventricle has normal function. The left ventricle has no regional wall motion abnormalities. Left ventricular diastolic parameters were normal.  2. Right ventricular systolic function is normal. The right ventricular size is normal. There is mildly elevated pulmonary artery systolic pressure. The estimated right ventricular systolic pressure is 50.2 mmHg.  3. The mitral valve is  normal in structure. No evidence of mitral valve regurgitation.  4. The aortic valve is tricuspid. Aortic valve regurgitation is not visualized. No aortic stenosis is present. FINDINGS  Left Ventricle: Left ventricular ejection fraction, by estimation, is 60 to 65%. The left ventricle has normal function. The left ventricle has no regional wall motion abnormalities. The left ventricular internal cavity size was normal in size. There is  no left ventricular hypertrophy. Left ventricular diastolic parameters were normal. Right Ventricle: The right ventricular size is normal. No increase in right ventricular wall thickness. Right ventricular systolic function is normal. There is mildly elevated pulmonary artery systolic pressure. The tricuspid regurgitant velocity is 2.89  m/s, and with an assumed right atrial pressure of 3 mmHg, the estimated right ventricular systolic pressure is 77.4 mmHg. Left Atrium: Left atrial size was normal in size. Right Atrium: Right atrial size was normal in size. Pericardium: There is no evidence of pericardial effusion. Mitral Valve: The mitral valve is normal in structure. No evidence of mitral valve regurgitation. Tricuspid  Valve: The tricuspid valve is normal in structure. Tricuspid valve regurgitation is trivial. Aortic Valve: The aortic valve is tricuspid. Aortic valve regurgitation is not visualized. No aortic stenosis is present. Pulmonic Valve: The pulmonic valve was not well visualized. Pulmonic valve regurgitation is not visualized. Aorta: The aortic root is normal in size and structure. IAS/Shunts: The interatrial septum was not well visualized.  LEFT VENTRICLE PLAX 2D LVIDd:         4.80 cm  Diastology LVIDs:         2.90 cm  LV e' medial:    7.18 cm/s LV PW:         0.80 cm  LV E/e' medial:  15.5 LV IVS:        0.80 cm  LV e' lateral:   13.60 cm/s LVOT diam:     1.90 cm  LV E/e' lateral: 8.2 LV SV:         54 LV SV Index:   34 LVOT Area:     2.84 cm  RIGHT VENTRICLE RV Basal  diam:  2.60 cm RV S prime:     13.30 cm/s TAPSE (M-mode): 1.6 cm LEFT ATRIUM             Index       RIGHT ATRIUM           Index LA diam:        3.00 cm 1.87 cm/m  RA Area:     11.10 cm LA Vol (A2C):   37.9 ml 23.66 ml/m RA Volume:   22.20 ml  13.86 ml/m LA Vol (A4C):   32.7 ml 20.41 ml/m LA Biplane Vol: 35.5 ml 22.16 ml/m  AORTIC VALVE LVOT Vmax:   105.00 cm/s LVOT Vmean:  69.900 cm/s LVOT VTI:    0.190 m  AORTA Ao Root diam: 2.20 cm MITRAL VALVE                TRICUSPID VALVE MV Area (PHT): 7.29 cm     TR Peak grad:   33.4 mmHg MV Decel Time: 104 msec     TR Vmax:        289.00 cm/s MV E velocity: 111.00 cm/s MV A velocity: 91.20 cm/s   SHUNTS MV E/A ratio:  1.22         Systemic VTI:  0.19 m                             Systemic Diam: 1.90 cm Oswaldo Milian MD Electronically signed by Oswaldo Milian MD Signature Date/Time: 10/01/2020/5:11:46 PM    Final     SURGICAL PATHOLOGY  CASE: [phone]  PATIENT: Nicole Browning  Surgical Pathology Report   FINAL MICROSCOPIC DIAGNOSIS:   A. LIVER, LEFT, NEEDLE CORE BIOPSY:  - Liver parenchyma with necrotizing acute inflammation.  See comment   COMMENT:   The findings are suggestive of an abscess.  Though evidence of  malignancy is not seen in the submitted biopsies, differential diagnosis  can include necrotic tumor with secondary inflammation.  Clinical and  endoscopic correlation is suggested.      Assessment & Recommendations  50 year old with colitis (confirmed by colonoscopy in April 2022 - indeterminate but with hx of perirectal abscesses Crohn's is favored) admitted with abdominal pain, nausea and vomiting, acute kidney injury, abnormal GI imaging including concern for metastatic malignancy versus infectious/abscesses  Liver &abdominal/peri-sigmoid masses/group F strep peritonitis and bactermia/indeterminate colitis (Crohn's favored) -- Liver  biopsy from yesterday found to be acute necrotizing inflammation most suggestive  of abscess.  Malignancy could still not be excluded. --Previous ascitic fluid with numerous neutrophils and insignificant lymphocytes most consistent with infection rather than a lymphoproliferative neoplasm --I have asked ID to see the patient today to help with antibiotics but also long-term plan for treatment given bacteremia and peritonitis --White count downtrending, today 15.5 --Now on levofloxacin and metronidazole as of yesterday; previously ceftriaxone changed to Zosyn --Could consider repeating flexible sigmoidoscopy but this is felt to be low yield given recent complete high-quality colonoscopy just 3 months ago --We will follow    LOS: 6 days   Jerene Bears  10/02/2020, 1:02 PM See Shea Evans, [facility], to contact our on call provider

## 2020-10-02 NOTE — Progress Notes (Signed)
Patient ID: Nicole Browning, female   DOB: 10/16/1970, 50 y.o.   MRN: [id number]  PROGRESS NOTE    Ron Junco  ELF:[id number] DOB: [birth year] DOA: 09/25/2020 PCP: Kerin Perna, NP   Brief Narrative:  50 year old female with recent diagnosis of IBD in April 2022 presented with abdominal pain, nausea and vomiting.  On presentation, she was slightly hypotensive which improved with IV fluids.  Creatinine was elevated at 2.54 along with AST of 133, ALT of 231 and total bilirubin of 0.8.  CT of the abdomen and pelvis showed features concerning for sigmoid mass with possible hepatic metastasis.  She was given a dose of IV Rocephin in the ED for possible UTI.  GI was consulted.  Patient underwent ultrasound-guided paracentesis and antibiotics were started for possible infected ascites versus liver abscess.  General surgery was consulted by GI.  She had EGD on 09/29/2020 which showed erosive gastritis.  She underwent liver biopsy on 10/01/2020 by IR.  Assessment & Plan:   Possible sigmoid mass with possible hepatic metastases versus abscesses Elevated LFTs -LFTs improving -GI following.  MRI of abdomen and pelvis showed sigmoid mass with air-fluid level along with separate hyperintense mass in the posterior left pelvis adherent to sigmoid serosa; this could be peritoneal metastatic disease versus pelvic abscesses and fistulous communication to the sigmoid colon could not be excluded.  2 liver masses were also identified, metastatic disease versus abscesses.  CT chest showed subcarinal lymph node with concern for metastatic disease and distal esophageal wall thickening. -CRP still elevated but slightly improving today. -Status post ultrasound-guided paracentesis on 09/19/2020: Small amount of perihepatic ascites was found in 100 cc fluid removed.  Fluid culture grew moderate Streptococcus group F intermediate to penicillin.  Antibiotics changed to Levaquin and Flagyl on 10/01/2020. -General surgery also  evaluated the patient and recommended no surgical intervention  -Status post EGD on 09/29/2020 which showed erosive gastritis.   -CT abdomen and pelvis with p.o. and IV contrast on 09/30/2020 showed the masses again.  S/P liver biopsy on 10/01/2020 by IR.  Follow cultures and cytology  Leukocytosis -Slightly improving.  Monitor  Acute renal failure -Possibly prerenal from dehydration and poor oral intake.   -Presented with creatinine of 2.54; prior creatinine was 0.79 -Improved with IV fluids.  DC IV fluids.   Recently diagnosed IBD -On Lialda.  Iron deficiency anemia -Hemoglobin stable.  Monitor  Hyponatremia -Mild.  IV fluids plan as above.  Signs of sacroiliitis -Seen on CAT scan.  Hypoalbuminemia Severe malnutrition -From recent poor oral intake.  Monitor -Follow nutrition recommendations    DVT prophylaxis: SCDs. Code Status: Full Family Communication: None at bedside Disposition Plan: Status is: Inpatient because: Inpatient level of care appropriate due to severity of illness  Dispo: The patient is from: Home              Anticipated d/c is to: Home              Patient currently is not medically stable to d/c.   Difficult to place patient No   Consultants: GI/IR/general surgery  Procedures: ultrasound-guided paracentesis on 09/19/2020: Small amount of perihepatic ascites was found in 100 cc fluid removed EGD on 09/29/2020 Impression:               - Minimal hiatal hernia.                           - Erosive gastritis. Recommendation:           -  Return patient to hospital ward for ongoing care.                           - Resume previous diet.                           - Continue present medications.                           - Await pathology results.                           - Would proceed with CT Abdo/pelvis with p.o. and                           IV contrast in AM. If still abn, would need IR                           guided biopsy. For now we will  continue Zosyn.                           - The findings and recommendations were discussed                           with the patient.  Ultrasound-guided biopsy and aspiration of liver mass on 10/01/2020 by IR  Antimicrobials:  Anti-infectives (From admission, onward)    Start     Dose/Rate Route Frequency Ordered Stop   10/01/20 1700  levofloxacin (LEVAQUIN) IVPB 750 mg        750 mg 100 mL/hr over 90 Minutes Intravenous Every 24 hours 10/01/20 1156     10/01/20 1600  metroNIDAZOLE (FLAGYL) IVPB 500 mg        500 mg 100 mL/hr over 60 Minutes Intravenous Every 8 hours 10/01/20 1156     09/27/20 0145  piperacillin-tazobactam (ZOSYN) IVPB 3.375 g  Status:  Discontinued        3.375 g 12.5 mL/hr over 240 Minutes Intravenous Every 8 hours 09/27/20 0057 10/01/20 1156   09/26/20 2200  cefTRIAXone (ROCEPHIN) 1 g in sodium chloride 0.9 % 100 mL IVPB  Status:  Discontinued        1 g 200 mL/hr over 30 Minutes Intravenous Every 24 hours 09/25/20 2331 09/26/20 0617   09/25/20 2300  cefTRIAXone (ROCEPHIN) 1 g in sodium chloride 0.9 % 100 mL IVPB        1 g 200 mL/hr over 30 Minutes Intravenous  Once 09/25/20 2245 09/25/20 2353         Subjective: Patient seen and examined at bedside.  No overnight chest pain, fever reported.  Still complains of intermittent abdominal pain.   Objective: Vitals:   10/01/20 1040 10/01/20 1832 10/02/20 0606 10/02/20 0631  BP: 126/83 135/86 (!) 138/92 135/86  Pulse: 96 (!) 102 (!) 114 (!) 108  Resp: 18 18 16    Temp: 97.9 F (36.6 C) 98.8 F (37.1 C) 98.2 F (36.8 C) 98 F (36.7 C)  TempSrc: Oral Oral Oral Oral  SpO2: 94% 100% 100% 97%  Weight:   59.3 kg   Height:        Intake/Output Summary (Last 24 hours) at 10/02/2020 0801 Last data  filed at 10/02/2020 5400 Gross per 24 hour  Intake 1950.72 ml  Output 800 ml  Net 1150.72 ml    Filed Weights   09/29/20 0912 09/30/20 0500 10/02/20 0606  Weight: 56.7 kg 56.7 kg 59.3 kg     Examination:  General exam: Currently intermittently requiring 2 L oxygen by nasal.  No distress. Respiratory system: Bilateral decreased breath sounds at bases with scattered crackles  cardiovascular system: S1-S2 heard, tachycardic  gastrointestinal system: Abdomen is distended slightly, soft and mildly tender diffusely in the lower quadrant.  Normal bowel sounds heard.   Extremities: Trace lower extremity edema present; no cyanosis Central nervous system: Still very slow to respond to questions; alert; no focal neurological deficits.  Moving extremities  skin: No obvious petechiae/lesions  psychiatry: Extremely flat affect.  Does not participate in conversation much.  Data Reviewed: I have personally reviewed following labs and imaging studies  CBC: Recent Labs  Lab 09/27/20 0343 09/28/20 0348 09/29/20 0224 09/30/20 0239 10/01/20 0008 10/02/20 0543  WBC 10.9* 11.5* 16.7* 20.0* 18.2* 15.5*  NEUTROABS 8.3* 8.1* 12.0* 16.4*  --  13.3*  HGB 8.5* 8.4* 8.2* 8.1* 7.8* 7.3*  HCT 25.4* 25.4* 24.4* 24.2* 24.1* 22.4*  MCV 80.1 81.7 81.3 81.8 82.8 83.6  PLT 283 322 353 397 497* 450*    Basic Metabolic Panel: Recent Labs  Lab 09/28/20 0348 09/29/20 0224 09/30/20 0239 10/01/20 0008 10/02/20 0543  NA 135 136 134* 134* 133*  K 4.0 4.1 4.5 4.3 4.3  CL 101 103 101 101 100  CO2 28 26 27 27 24   GLUCOSE 115* 110* 151* 130* 97  BUN 8 5* 6 <5* <5*  CREATININE 0.75 0.66 0.76 0.69 0.66  CALCIUM 8.8* 8.7* 8.8* 8.7* 8.6*  MG 1.8 1.7 1.7 1.9 1.7    GFR: Estimated Creatinine Clearance: 72.6 mL/min (by C-G formula based on SCr of 0.66 mg/dL). Liver Function Tests: Recent Labs  Lab 09/28/20 0348 09/29/20 0224 09/30/20 0239 10/01/20 0008 10/02/20 0543  AST 41 35 28 26 34  ALT 99* 77* 63* 56* 54*  ALKPHOS 73 71 75 76 58  BILITOT 0.8 0.9 0.5 0.3 0.9  PROT 5.8* 5.7* 5.9* 6.3* 6.3*  ALBUMIN 1.7* 1.6* 1.6* 1.7* 1.7*    Recent Labs  Lab 09/25/20 1641  LIPASE 22    No  results for input(s): AMMONIA in the last 168 hours. Coagulation Profile: Recent Labs  Lab 09/27/20 0725 10/01/20 0008  INR 1.2 1.2    Cardiac Enzymes: No results for input(s): CKTOTAL, CKMB, CKMBINDEX, TROPONINI in the last 168 hours. BNP (last 3 results) No results for input(s): PROBNP in the last 8760 hours. HbA1C: No results for input(s): HGBA1C in the last 72 hours. CBG: No results for input(s): GLUCAP in the last 168 hours. Lipid Profile: No results for input(s): CHOL, HDL, LDLCALC, TRIG, CHOLHDL, LDLDIRECT in the last 72 hours. Thyroid Function Tests: No results for input(s): TSH, T4TOTAL, FREET4, T3FREE, THYROIDAB in the last 72 hours. Anemia Panel: No results for input(s): VITAMINB12, FOLATE, FERRITIN, TIBC, IRON, RETICCTPCT in the last 72 hours. Sepsis Labs: Recent Labs  Lab 09/26/20 2140  PROCALCITON 15.92  LATICACIDVEN 1.1     Recent Results (from the past 240 hour(s))  Urine Culture     Status: Abnormal   Collection Time: 09/25/20 10:01 PM   Specimen: Urine, Clean Catch  Result Value Ref Range Status   Specimen Description URINE, CLEAN CATCH  Final   Special Requests   Final  NONE Performed at [facility], [place] [address] [place], [place] [address]    Culture MULTIPLE SPECIES PRESENT, SUGGEST RECOLLECTION (A)  Final   Report Status 09/27/2020 FINAL  Final  Resp Panel by RT-PCR (Flu A&B, Covid) Nasopharyngeal Swab     Status: None   Collection Time: 09/25/20 10:09 PM   Specimen: Nasopharyngeal Swab; Nasopharyngeal(NP) swabs in vial transport medium  Result Value Ref Range Status   SARS Coronavirus 2 by RT PCR NEGATIVE NEGATIVE Final    Comment: (NOTE) SARS-CoV-2 target nucleic acids are NOT DETECTED.  The SARS-CoV-2 RNA is generally detectable in upper respiratory specimens during the acute phase of infection. The lowest concentration of SARS-CoV-2 viral copies this assay can detect is 138 copies/mL. A negative result does not preclude  SARS-Cov-2 infection and should not be used as the sole basis for treatment or other patient management decisions. A negative result may occur with  improper specimen collection/handling, submission of specimen other than nasopharyngeal swab, presence of viral mutation(s) within the areas targeted by this assay, and inadequate number of viral copies(<138 copies/mL). A negative result must be combined with clinical observations, patient history, and epidemiological information. The expected result is Negative.  Fact Sheet for Patients:  EntrepreneurPulse.com.au  Fact Sheet for Healthcare Providers:  IncredibleEmployment.be  This test is no t yet approved or cleared by the [place] FDA and  has been authorized for detection and/or diagnosis of SARS-CoV-2 by FDA under an Emergency Use Authorization (EUA). This EUA will remain  in effect (meaning this test can be used) for the duration of the COVID-19 declaration under Section 564(b)(1) of the Act, 21 U.S.C.section 360bbb-3(b)(1), unless the authorization is terminated  or revoked sooner.       Influenza A by PCR NEGATIVE NEGATIVE Final   Influenza B by PCR NEGATIVE NEGATIVE Final    Comment: (NOTE) The Xpert Xpress SARS-CoV-2/FLU/RSV plus assay is intended as an aid in the diagnosis of influenza from Nasopharyngeal swab specimens and should not be used as a sole basis for treatment. Nasal washings and aspirates are unacceptable for Xpert Xpress SARS-CoV-2/FLU/RSV testing.  Fact Sheet for Patients: EntrepreneurPulse.com.au  Fact Sheet for Healthcare Providers: IncredibleEmployment.be  This test is not yet approved or cleared by the [place] FDA and has been authorized for detection and/or diagnosis of SARS-CoV-2 by FDA under an Emergency Use Authorization (EUA). This EUA will remain in effect (meaning this test can be used) for the duration of  the COVID-19 declaration under Section 564(b)(1) of the Act, 21 U.S.C. section 360bbb-3(b)(1), unless the authorization is terminated or revoked.  Performed at [facility], [place] [address] [place], [place] [address]   Culture, blood (routine x 2)     Status: None   Collection Time: 09/26/20  9:40 PM   Specimen: BLOOD  Result Value Ref Range Status   Specimen Description BLOOD SITE NOT SPECIFIED  Final   Special Requests   Final    BOTTLES DRAWN AEROBIC AND ANAEROBIC Blood Culture adequate volume   Culture   Final    NO GROWTH 5 DAYS Performed at [facility], [address], [place], [place] [address]    Report Status 10/01/2020 FINAL  Final  Culture, blood (routine x 2)     Status: None   Collection Time: 09/26/20  9:46 PM   Specimen: BLOOD  Result Value Ref Range Status   Specimen Description BLOOD LEFT ANTECUBITAL  Final   Special Requests   Final  BOTTLES DRAWN AEROBIC AND ANAEROBIC Blood Culture adequate volume   Culture   Final    NO GROWTH 5 DAYS Performed at [facility], [place] [address], [place], [place] [address]    Report Status 10/01/2020 FINAL  Final  Body fluid culture w Gram Stain     Status: None   Collection Time: 09/27/20 11:30 AM   Specimen: Peritoneal Washings; Peritoneal Fluid  Result Value Ref Range Status   Specimen Description PERITONEAL FLUID  Final   Special Requests NONE  Final   Gram Stain   Final    ABUNDANT WBC PRESENT, PREDOMINANTLY PMN NO ORGANISMS SEEN    Culture   Final    MODERATE STREPTOCOCCUS GROUP F CRITICAL RESULT CALLED TO, READ BACK BY AND VERIFIED WITH: RN E.BLUE AT 0175 ON 10/01/2020 BY T.SAAD. Performed at [facility], [place] [address], [place], [place] [address]    Report Status 10/01/2020 FINAL  Final   Organism ID, Bacteria STREPTOCOCCUS GROUP F  Final      Susceptibility   Streptococcus group f - MIC*    PENICILLIN INTERMEDIATE Intermediate     CEFTRIAXONE 1 SENSITIVE Sensitive      ERYTHROMYCIN <=0.12 SENSITIVE Sensitive     LEVOFLOXACIN 0.5 SENSITIVE Sensitive     VANCOMYCIN 0.5 SENSITIVE Sensitive     * MODERATE STREPTOCOCCUS GROUP F  Aerobic/Anaerobic Culture w Gram Stain (surgical/deep wound)     Status: None (Preliminary result)   Collection Time: 10/01/20  9:30 AM   Specimen: Tissue  Result Value Ref Range Status   Specimen Description TISSUE  Final   Special Requests LEFT LIVER MASS  Final   Gram Stain   Final    NO WBC SEEN NO ORGANISMS SEEN Performed at [facility], [place] [address], [place], [place] [address]    Culture PENDING  Incomplete   Report Status PENDING  Incomplete  Aerobic/Anaerobic Culture w Gram Stain (surgical/deep wound)     Status: None (Preliminary result)   Collection Time: 10/01/20  9:47 AM   Specimen: Abscess  Result Value Ref Range Status   Specimen Description ABSCESS  Final   Special Requests LEFT LIVER  Final   Gram Stain   Final    NO WBC SEEN NO ORGANISMS SEEN Performed at [facility], [address], [place], [place] [address]    Culture PENDING  Incomplete   Report Status PENDING  Incomplete          Radiology Studies: CT ABDOMEN PELVIS W CONTRAST  Result Date: 09/30/2020 CLINICAL DATA:  Chronic abdominal pain. Prior CT suspicious for sigmoid neoplasm. History of Crohn disease. EXAM: CT ABDOMEN AND PELVIS WITH CONTRAST TECHNIQUE: Multidetector CT imaging of the abdomen and pelvis was performed using the standard protocol following bolus administration of intravenous contrast. CONTRAST:  158m OMNIPAQUE IOHEXOL 300 MG/ML  SOLN COMPARISON:  Abdominopelvic CT of 09/25/2020. Pelvic abdominopelvic MRI 09/26/2020. FINDINGS: Lower chest: Worsened bibasilar aeration with development of right greater than left base airspace disease. Normal heart size with new tiny bilateral pleural effusions. Hepatobiliary: Capsular based masses within the liver may be parenchymal or peritoneal. Example within the caudate lobe at  6.3 x 6.1 cm on 22/3 and the posterior aspect of segment 2-3 at 6.0 x 4.3 cm on 27/3. Gallbladder wall thickening is nonspecific. No calcified stone or biliary duct dilatation. Pancreas: Normal, without mass or ductal dilatation. Spleen: Normal in size, without focal abnormality. Adrenals/Urinary Tract: Normal adrenal glands. Left renal  sinus cyst an interpolar too small to characterize lesion. Normal right kidney. Normal urinary bladder. Stomach/Bowel: Portions of the stomach are decompressed and therefore likely secondarily appear thick walled.The sigmoid is moderately thick walled, including on 71/3. Normal terminal ileum and appendix. Normal small bowel caliber. No complicating obstruction. Vascular/Lymphatic: Aortic atherosclerosis. No abdominopelvic adenopathy. Porta hepatis nodes of up to 10 mm are not pathologic by size criteria. Reproductive: Normal uterus and adnexa. Other: Development of small volume ascites with loculated fluid collections since 09/25/2020. Example collection with peritoneal thickening and hyperenhancement within the anterior abdomen at 13.8 x 4.3 cm on 42/3. A complex fluid and gas collection cephalad to the thickened sigmoid measures 3.7 x 2.8 cm on 67/3 and is similar to 09/25/2020. Inferior to this thickened sigmoid is a fluid collection as detailed on recent MRI with minimal gas within. Example 3.1 x 2.4 cm on 74/3. No free intraperitoneal air. Presumably iatrogenic nodularity about the anterior abdominal wall. Musculoskeletal: Right sacroiliac joint sclerosis suggest sacroiliitis. IMPRESSION: 1. Findings which are again suspicious for metastatic disease to the peritoneum and possibly hepatic parenchyma. Dominant capsular based liver masses have been detailed previously. A new loculated fluid collection within the anterior abdomen with peritoneal thickening/enhancement. Differential considerations include infected ascites/developing abscess or peritoneal carcinomatosis. Correlate  with paracentesis results of 1 day prior. 2. Sigmoid wall thickening which could represent the site of primary neoplasm. Pericolonic complex fluid collections could represent developing abscesses, and were detailed on the prior pelvic MRI. 3. No bowel obstruction or other acute complication. 4. New small bilateral pleural effusions with adjacent atelectasis or infection. 5. Apparent gastric wall thickening, at least partially felt to be due to underdistention. Electronically Signed   By: Abigail Miyamoto M.D.   On: 09/30/2020 14:22   US BIOPSY (LIVER)  Result Date: 10/01/2020 INDICATION: 50 year old female with a history of liver masses, known inflammatory bowel disease, admitted for sepsis. EXAM: ULTRASOUND-GUIDED LIVER MASS BIOPSY ULTRASOUND-GUIDED LIVER MASS CULTURE/ASPIRATION MEDICATIONS: None. ANESTHESIA/SEDATION: Moderate (conscious) sedation was employed during this procedure. A total of Versed 2.0 mg and Fentanyl 75 mcg was administered intravenously. Moderate Sedation Time: 14 minutes. The patient's level of consciousness and vital signs were monitored continuously by radiology nursing throughout the procedure under my direct supervision. FLUOROSCOPY TIME:  Ultrasound COMPLICATIONS: None PROCEDURE: Informed written consent was obtained from the patient after a thorough discussion of the procedural risks, benefits and alternatives. All questions were addressed. Maximal Sterile Barrier Technique was utilized including caps, mask, sterile gowns, sterile gloves, sterile drape, hand hygiene and skin antiseptic. A timeout was performed prior to the initiation of the procedure. Ultrasound survey of the right liver lobe performed with images stored and sent to PACs. The subxiphoid region was prepped with chlorhexidine in a sterile fashion, and a sterile drape was applied covering the operative field. A sterile gown and sterile gloves were used for the procedure. Local anesthesia was provided with 1% Lidocaine. The  patient was prepped and draped sterilely and the skin and subcutaneous tissues were generously infiltrated with 1% lidocaine. A 17 gauge introducer needle was then advanced under ultrasound guidance in subxiphoid location into the left liver, targeting the heterogeneous mass in the posterior left liver. The stylet was removed, and multiple separate 18 gauge core biopsy were retrieved. Samples were placed into both formalin solution, as well as sterile saline solution. The needle was then aspirated upon withdrawal for a liquid culture to be sent to the lab. Final ultrasound image was performed. The patient tolerated the  procedure well and remained hemodynamically stable throughout. No complications were encountered and no significant blood loss was encounter. FINDINGS: Ultrasound demonstrates solid appearance of both right liver mass and left liver mass, with no ultrasound correlate of what appears to be some septations and fluid on the prior MR and CT imaging. IMPRESSION: Status post ultrasound-guided left liver mass biopsy and liver mass culture/aspiration, as diagnostic test for possible metastatic lesion versus infection Signed, Dulcy Fanny. Dellia Nims, RPVI [facility] Electronically Signed   By: Corrie Mckusick D.O.   On: 10/01/2020 10:08   ECHOCARDIOGRAM COMPLETE  Result Date: 10/01/2020    ECHOCARDIOGRAM REPORT   Patient Name:   Nicole Browning Date of Exam: 10/01/2020 Medical Rec #:  [id number]      Height:       64.0 in Accession #:    [id number]     Weight:       125.0 lb Date of Birth:  12-10-1970      BSA:          1.602 m Patient Age:    44 years       BP:           126/83 mmHg Patient Gender: F              HR:           96 bpm. Exam Location:  Inpatient Procedure: 2D Echo, Cardiac Doppler and Color Doppler Indications:    Bacteremia  History:        Patient has no prior history of Echocardiogram examinations.                 Arrythmias:Tachycardia.  ARF, Anemia, Liver mass.  Sonographer:    Dustin Flock Referring Phys: [id number] [place]  1. Left ventricular ejection fraction, by estimation, is 60 to 65%. The left ventricle has normal function. The left ventricle has no regional wall motion abnormalities. Left ventricular diastolic parameters were normal.  2. Right ventricular systolic function is normal. The right ventricular size is normal. There is mildly elevated pulmonary artery systolic pressure. The estimated right ventricular systolic pressure is 51.7 mmHg.  3. The mitral valve is normal in structure. No evidence of mitral valve regurgitation.  4. The aortic valve is tricuspid. Aortic valve regurgitation is not visualized. No aortic stenosis is present. FINDINGS  Left Ventricle: Left ventricular ejection fraction, by estimation, is 60 to 65%. The left ventricle has normal function. The left ventricle has no regional wall motion abnormalities. The left ventricular internal cavity size was normal in size. There is  no left ventricular hypertrophy. Left ventricular diastolic parameters were normal. Right Ventricle: The right ventricular size is normal. No increase in right ventricular wall thickness. Right ventricular systolic function is normal. There is mildly elevated pulmonary artery systolic pressure. The tricuspid regurgitant velocity is 2.89  m/s, and with an assumed right atrial pressure of 3 mmHg, the estimated right ventricular systolic pressure is 61.6 mmHg. Left Atrium: Left atrial size was normal in size. Right Atrium: Right atrial size was normal in size. Pericardium: There is no evidence of pericardial effusion. Mitral Valve: The mitral valve is normal in structure. No evidence of mitral valve regurgitation. Tricuspid Valve: The tricuspid valve is normal in structure. Tricuspid valve regurgitation is trivial. Aortic Valve: The aortic valve is tricuspid. Aortic valve regurgitation is not visualized. No aortic stenosis is  present. Pulmonic Valve: The pulmonic valve was not well visualized. Pulmonic valve regurgitation  is not visualized. Aorta: The aortic root is normal in size and structure. IAS/Shunts: The interatrial septum was not well visualized.  LEFT VENTRICLE PLAX 2D LVIDd:         4.80 cm  Diastology LVIDs:         2.90 cm  LV e' medial:    7.18 cm/s LV PW:         0.80 cm  LV E/e' medial:  15.5 LV IVS:        0.80 cm  LV e' lateral:   13.60 cm/s LVOT diam:     1.90 cm  LV E/e' lateral: 8.2 LV SV:         54 LV SV Index:   34 LVOT Area:     2.84 cm  RIGHT VENTRICLE RV Basal diam:  2.60 cm RV S prime:     13.30 cm/s TAPSE (M-mode): 1.6 cm LEFT ATRIUM             Index       RIGHT ATRIUM           Index LA diam:        3.00 cm 1.87 cm/m  RA Area:     11.10 cm LA Vol (A2C):   37.9 ml 23.66 ml/m RA Volume:   22.20 ml  13.86 ml/m LA Vol (A4C):   32.7 ml 20.41 ml/m LA Biplane Vol: 35.5 ml 22.16 ml/m  AORTIC VALVE LVOT Vmax:   105.00 cm/s LVOT Vmean:  69.900 cm/s LVOT VTI:    0.190 m  AORTA Ao Root diam: 2.20 cm MITRAL VALVE                TRICUSPID VALVE MV Area (PHT): 7.29 cm     TR Peak grad:   33.4 mmHg MV Decel Time: 104 msec     TR Vmax:        289.00 cm/s MV E velocity: 111.00 cm/s MV A velocity: 91.20 cm/s   SHUNTS MV E/A ratio:  1.22         Systemic VTI:  0.19 m                             Systemic Diam: 1.90 cm Oswaldo Milian MD Electronically signed by Oswaldo Milian MD Signature Date/Time: 10/01/2020/5:11:46 PM    Final         Scheduled Meds:  feeding supplement  237 mL Oral TID BM   mesalamine  1.2 g Oral BID   multivitamin with minerals  1 tablet Oral Daily   Continuous Infusions:  sodium chloride 250 mL (09/30/20 2144)   0.9 % NaCl with KCl 40 mEq / L 50 mL/hr at 10/02/20 0212   levofloxacin (LEVAQUIN) IV 750 mg (10/01/20 1752)   metronidazole Stopped (10/02/20 0200)          Aline August, MD [facility] 10/02/2020, 8:01 AM

## 2020-10-02 NOTE — Plan of Care (Signed)
  Problem: Education: Goal: Knowledge of General Education information will improve Description: Including pain rating scale, medication(s)/side effects and non-pharmacologic comfort measures Outcome: Progressing   Problem: Clinical Measurements: Goal: Will remain free from infection Outcome: Progressing   Problem: Activity: Goal: Risk for activity intolerance will decrease Outcome: Progressing   Problem: Nutrition: Goal: Adequate nutrition will be maintained 10/02/2020 1021 by Dolores Hoose, RN Outcome: Progressing 10/02/2020 1021 by Dolores Hoose, RN Outcome: Progressing   Problem: Coping: Goal: Level of anxiety will decrease Outcome: Progressing   Problem: Skin Integrity: Goal: Risk for impaired skin integrity will decrease Outcome: Progressing

## 2020-10-02 NOTE — Evaluation (Signed)
Physical Therapy Evaluation Patient Details Name: Nicole Browning MRN: [id number] DOB: 26-Sep-1970 Today's Date: 10/02/2020   History of Present Illness  50 year old with colitis (confirmed by colonoscopy in April 2022 - indeterminate but with hx of perirectal abscesses Crohn's is favored) admitted with abdominal pain, nausea and vomiting, acute kidney injury, abnormal GI imaging including concern for metastatic malignancy versus infectious/abscesses   Clinical Impression  Patient received in bed, she is agreeable to PT assessment. Patient is independent with bed mobility and transfers with supervision. Ambulates with min guard to supervision. Drifts to right and left and almost staggers at times due to weakness. Patient will continue to benefit from skilled PT while here to improve strength and functional mobility to return home safely at discharge.      Follow Up Recommendations No PT follow up    Equipment Recommendations  None recommended by PT    Recommendations for Other Services       Precautions / Restrictions Precautions Precautions: Fall Restrictions Weight Bearing Restrictions: No      Mobility  Bed Mobility Overal bed mobility: Independent                  Transfers Overall transfer level: Needs assistance Equipment used: None Transfers: Sit to/from Stand Sit to Stand: Supervision            Ambulation/Gait Ambulation/Gait assistance: Min guard Gait Distance (Feet): 200 Feet Assistive device: None Gait Pattern/deviations: Step-through pattern;Drifts right/left;Decreased step length - right;Decreased step length - left Gait velocity: decr   General Gait Details: patient slightly unsteady with ambulation, benefits from assistance when out in hallways. No overt lob, but veers left and right almost staggering at times.  Stairs            Wheelchair Mobility    Modified Rankin (Stroke Patients Only)       Balance Overall balance  assessment: Mild deficits observed, not formally tested                                           Pertinent Vitals/Pain Pain Assessment: Faces Faces Pain Scale: Hurts a little bit Pain Location: abdomen Pain Descriptors / Indicators: Discomfort Pain Intervention(s): Monitored during session    Home Living Family/patient expects to be discharged to:: Private residence Living Arrangements: Children Available Help at Discharge: Family;Available PRN/intermittently           Home Equipment: None Additional Comments: patient lives at home with 75 year old daughter. Cleans houses for work. Fully independent prior.    Prior Function Level of Independence: Independent               Hand Dominance        Extremity/Trunk Assessment   Upper Extremity Assessment Upper Extremity Assessment: Generalized weakness    Lower Extremity Assessment Lower Extremity Assessment: Generalized weakness    Cervical / Trunk Assessment Cervical / Trunk Assessment: Normal  Communication   Communication: No difficulties  Cognition Arousal/Alertness: Awake/alert Behavior During Therapy: WFL for tasks assessed/performed Overall Cognitive Status: Within Functional Limits for tasks assessed                                        General Comments      Exercises     Assessment/Plan    PT  Assessment Patient needs continued PT services  PT Problem List Decreased strength;Decreased activity tolerance;Decreased balance;Decreased mobility       PT Treatment Interventions Therapeutic exercise;Gait training;Balance training;Stair training;Functional mobility training;Therapeutic activities;Patient/family education    PT Goals (Current goals can be found in the Care Plan section)  Acute Rehab PT Goals Patient Stated Goal: to return home to daughter PT Goal Formulation: With patient Time For Goal Achievement: 10/09/20 Potential to Achieve Goals: Good     Frequency Min 3X/week   Barriers to discharge Decreased caregiver support      Co-evaluation               AM-PAC PT "6 Clicks" Mobility  Outcome Measure Help needed turning from your back to your side while in a flat bed without using bedrails?: None Help needed moving from lying on your back to sitting on the side of a flat bed without using bedrails?: None Help needed moving to and from a bed to a chair (including a wheelchair)?: None Help needed standing up from a chair using your arms (e.g., wheelchair or bedside chair)?: None Help needed to walk in hospital room?: A Little Help needed climbing 3-5 steps with a railing? : A Little 6 Click Score: 22    End of Session Equipment Utilized During Treatment: Gait belt Activity Tolerance: Patient tolerated treatment well Patient left: in bed;with call bell/phone within reach Nurse Communication: Mobility status PT Visit Diagnosis: Unsteadiness on feet (R26.81);Muscle weakness (generalized) (M62.81);Difficulty in walking, not elsewhere classified (R26.2)    Time: 4540-9811 PT Time Calculation (min) (ACUTE ONLY): 24 min   Charges:   PT Evaluation $PT Eval Moderate Complexity: 1 Mod PT Treatments $Gait Training: 8-22 mins        Tinesha Siegrist, PT, [facility] 10/02/20,1:37 PM

## 2020-10-03 ENCOUNTER — Inpatient Hospital Stay (HOSPITAL_COMMUNITY): Payer: Medicaid Other

## 2020-10-03 ENCOUNTER — Other Ambulatory Visit: Payer: Self-pay

## 2020-10-03 ENCOUNTER — Other Ambulatory Visit (HOSPITAL_COMMUNITY): Payer: Self-pay

## 2020-10-03 DIAGNOSIS — D500 Iron deficiency anemia secondary to blood loss (chronic): Secondary | ICD-10-CM

## 2020-10-03 DIAGNOSIS — N179 Acute kidney failure, unspecified: Secondary | ICD-10-CM

## 2020-10-03 DIAGNOSIS — K651 Peritoneal abscess: Secondary | ICD-10-CM

## 2020-10-03 DIAGNOSIS — K529 Noninfective gastroenteritis and colitis, unspecified: Secondary | ICD-10-CM | POA: Diagnosis not present

## 2020-10-03 HISTORY — DX: Acute kidney failure, unspecified: N17.9

## 2020-10-03 LAB — COMPREHENSIVE METABOLIC PANEL
ALT: 53 U/L — ABNORMAL HIGH (ref 0–44)
AST: 34 U/L (ref 15–41)
Albumin: 1.8 g/dL — ABNORMAL LOW (ref 3.5–5.0)
Alkaline Phosphatase: 63 U/L (ref 38–126)
Anion gap: 9 (ref 5–15)
BUN: <5 mg/dL — ABNORMAL LOW (ref 6–20)
CO2: 26 mmol/L (ref 22–32)
Calcium: 8.7 mg/dL — ABNORMAL LOW (ref 8.9–10.3)
Chloride: 99 mmol/L (ref 98–111)
Creatinine, Ser: 0.75 mg/dL (ref 0.44–1.00)
GFR, Estimated: >60 mL/min (ref >60)
Glucose, Bld: 99 mg/dL (ref 70–99)
Potassium: 4.4 mmol/L (ref 3.5–5.1)
Sodium: 134 mmol/L — ABNORMAL LOW (ref 135–145)
Total Bilirubin: 0.5 mg/dL (ref 0.3–1.2)
Total Protein: 6.4 g/dL — ABNORMAL LOW (ref 6.5–8.1)

## 2020-10-03 LAB — CBC WITH DIFFERENTIAL/PLATELET
Abs Immature Granulocytes: 0 10*3/uL (ref 0.00–0.07)
Basophils Absolute: 0 10*3/uL (ref 0.0–0.1)
Basophils Relative: 0 %
Eosinophils Absolute: 0 10*3/uL (ref 0.0–0.5)
Eosinophils Relative: 0 %
HCT: 23.1 % — ABNORMAL LOW (ref 36.0–46.0)
Hemoglobin: 7.6 g/dL — ABNORMAL LOW (ref 12.0–15.0)
Lymphocytes Relative: 12 %
Lymphs Abs: 1.4 10*3/uL (ref 0.7–4.0)
MCH: 27.5 pg (ref 26.0–34.0)
MCHC: 32.9 g/dL (ref 30.0–36.0)
MCV: 83.7 fL (ref 80.0–100.0)
Monocytes Absolute: 0.8 10*3/uL (ref 0.1–1.0)
Monocytes Relative: 7 %
Neutro Abs: 9.4 10*3/uL — ABNORMAL HIGH (ref 1.7–7.7)
Neutrophils Relative %: 81 %
Platelets: 485 10*3/uL — ABNORMAL HIGH (ref 150–400)
RBC: 2.76 MIL/uL — ABNORMAL LOW (ref 3.87–5.11)
RDW: 15.9 % — ABNORMAL HIGH (ref 11.5–15.5)
WBC: 11.6 10*3/uL — ABNORMAL HIGH (ref 4.0–10.5)
nRBC: 0 % (ref 0.0–0.2)
nRBC: 0 /100 WBC

## 2020-10-03 LAB — C-REACTIVE PROTEIN: CRP: 17.2 mg/dL — ABNORMAL HIGH (ref <1.0)

## 2020-10-03 LAB — MAGNESIUM: Magnesium: 1.9 mg/dL (ref 1.7–2.4)

## 2020-10-03 IMAGING — CT CT IMAGE GUIDED DRAINAGE BY PERCUTANEOUS CATHETER
1 of 4 series · 12 of 32 positions shown, 18 images · non-contrast
Comparison: [DATE]

INDICATION: 50-year-old female with abdominal pain and sepsis found to have
subhepatic fluid collection on recent CT.

EXAM:
CT IMAGE GUIDED DRAINAGE BY PERCUTANEOUS CATHETER

[Series 2: i-spiral 5.0 b40f · axial · 0.78mm/px · z∈[+1070,+1301]mm · 12 of 80 slices shown, 18 images]
[im 7/80  soft-tissue]
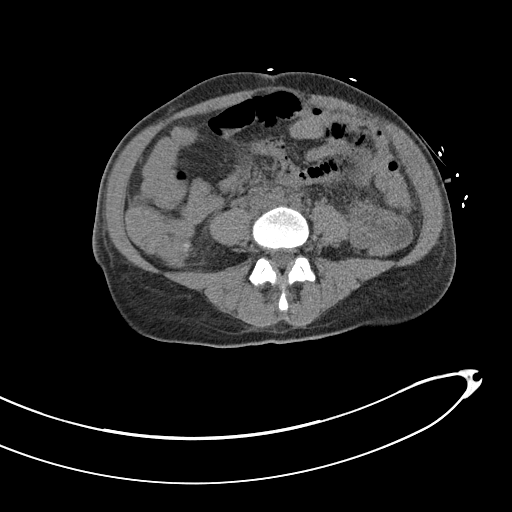
[im 7/80  bone]
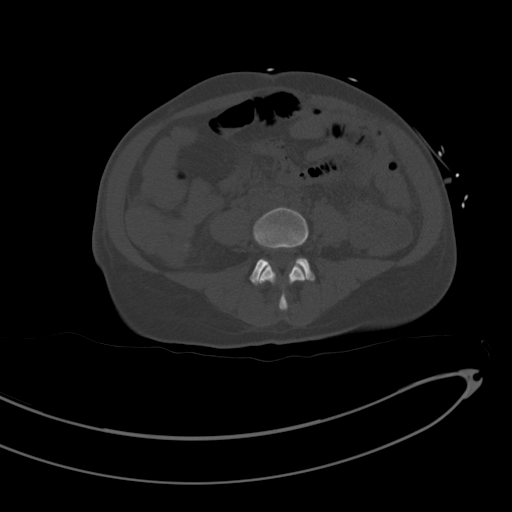
[im 13/80  soft-tissue]
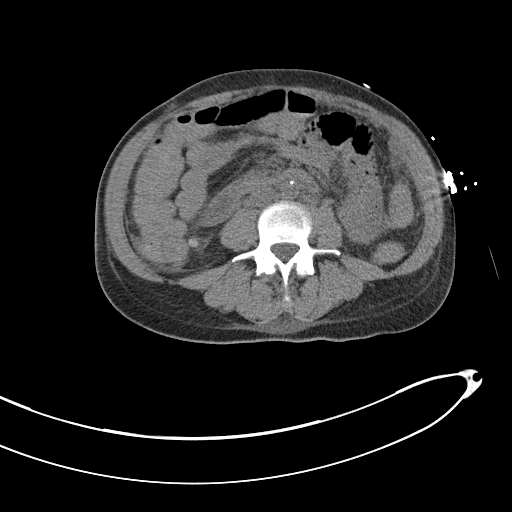
[im 19/80  soft-tissue]
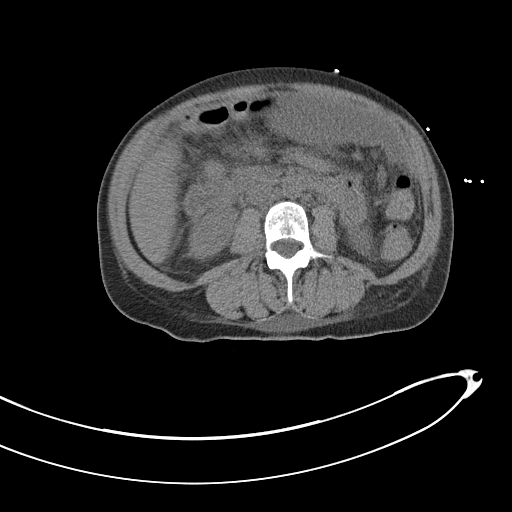
[im 25/80  soft-tissue]
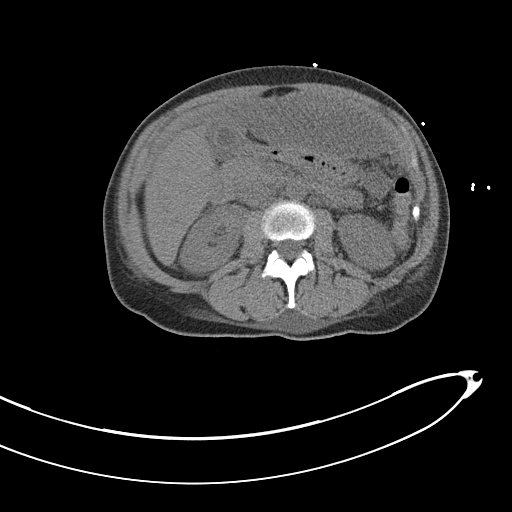
[im 31/80  soft-tissue]
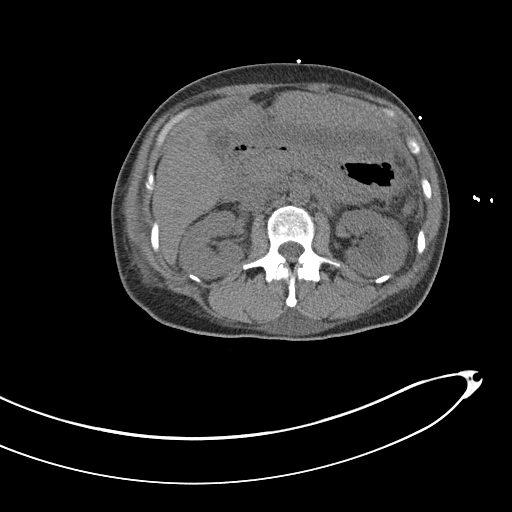
[im 37/80  soft-tissue]
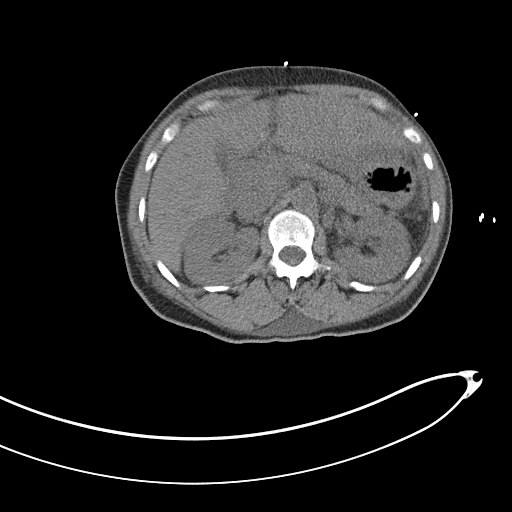
[im 43/80  soft-tissue]
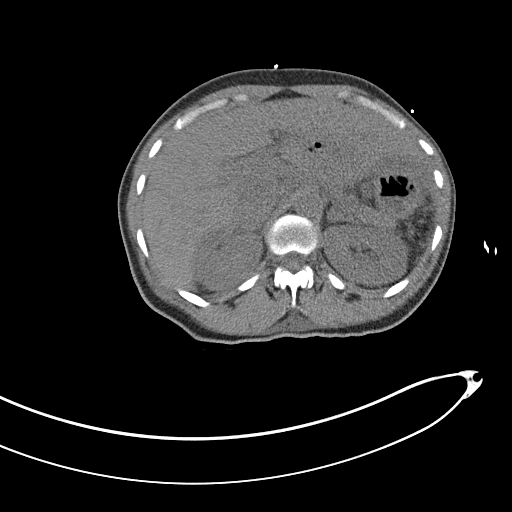
[im 49/80  soft-tissue]
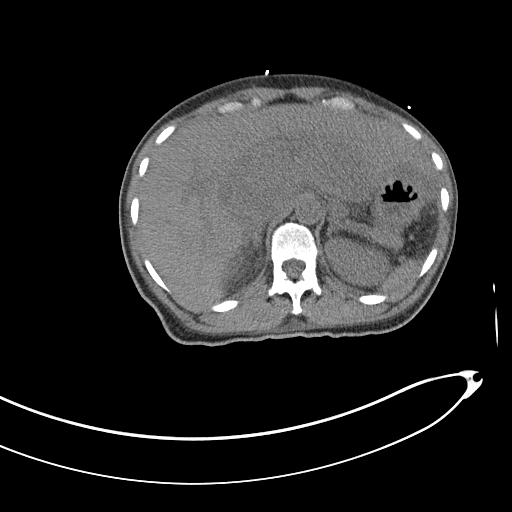
[im 55/80  soft-tissue]
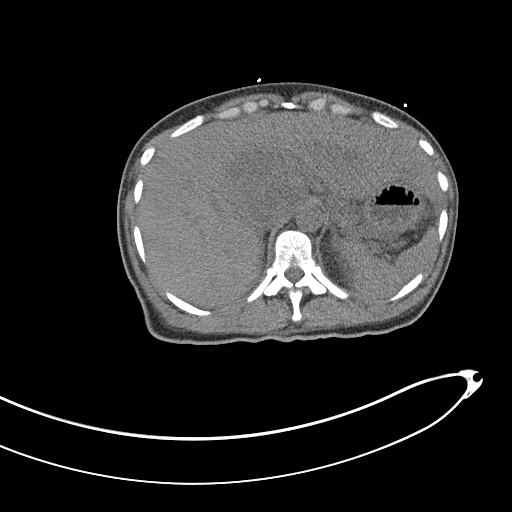
[im 55/80  lung]
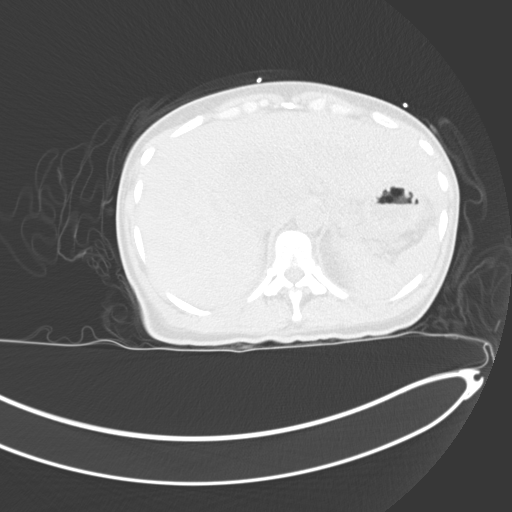
[im 55/80  bone]
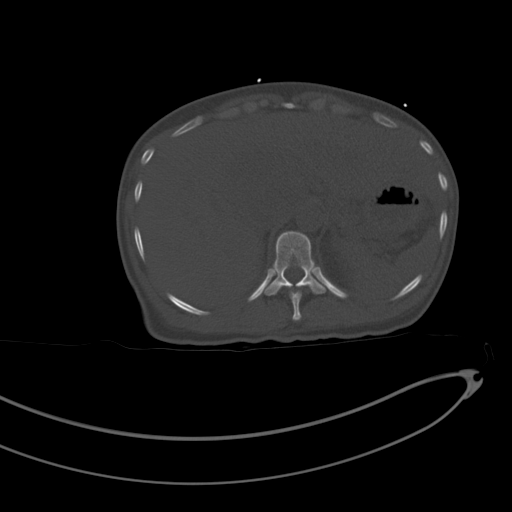
[im 61/80  soft-tissue]
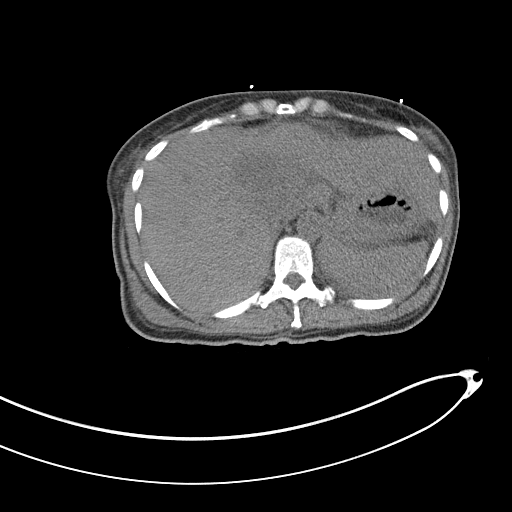
[im 61/80  lung]
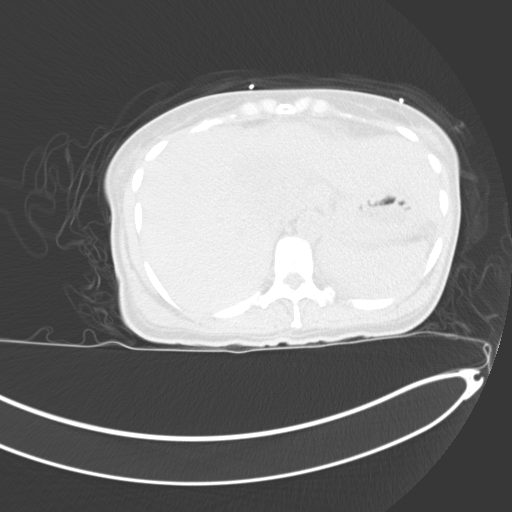
[im 67/80  soft-tissue]
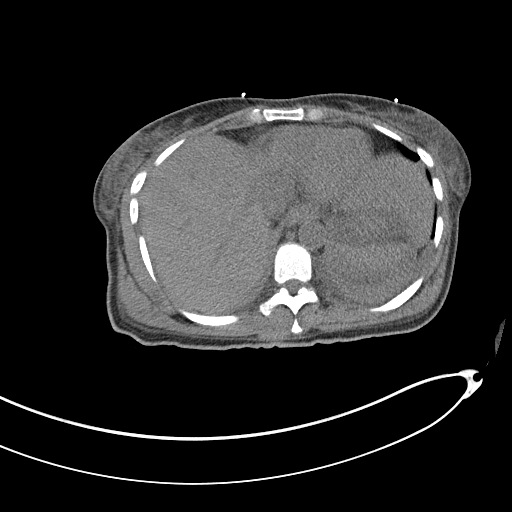
[im 67/80  lung]
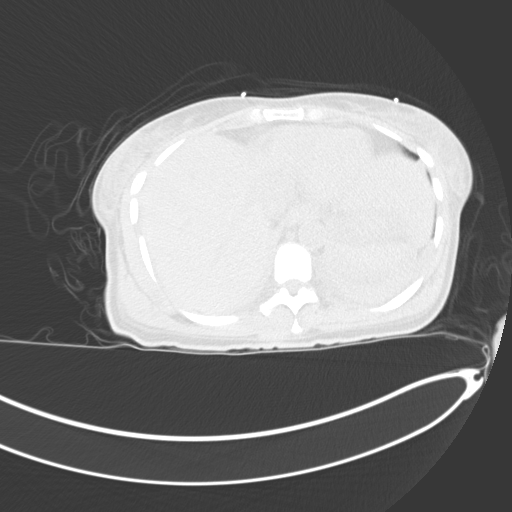
[im 73/80  soft-tissue]
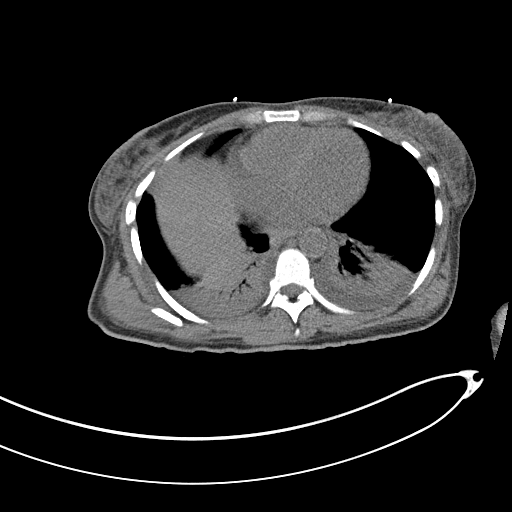
[im 73/80  lung]
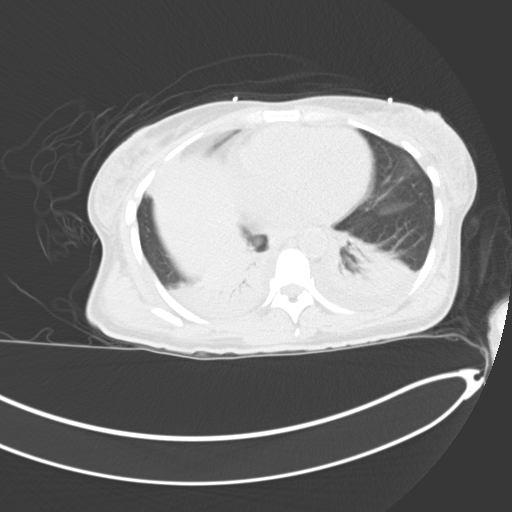

[12 of 32 positions shown; findings below may reference images not displayed]

MEDICATIONS:
The patient is currently admitted to the hospital and receiving
intravenous antibiotics. The antibiotics were administered within an
appropriate time frame prior to the initiation of the procedure.

ANESTHESIA/SEDATION:
Moderate (conscious) sedation was employed during this procedure. A
total of Versed 1.5 mg and Fentanyl 100 mcg was administered
intravenously.

Moderate Sedation Time: 14 minutes. The patient's level of
consciousness and vital signs were monitored continuously by
radiology nursing throughout the procedure under my direct
supervision.

CONTRAST:  None

COMPLICATIONS:
None immediate.

PROCEDURE:
Informed written consent was obtained from the patient after a
discussion of the risks, benefits and alternatives to treatment. The
patient was placed supine on the CT gantry and a pre procedural CT
was performed re-demonstrating the known subhepatic abscess/fluid
collection. The procedure was planned. A timeout was performed prior
to the initiation of the procedure.

The midline upper abdominal region was prepped and draped in the
usual sterile fashion. The overlying soft tissues were anesthetized
with 1% lidocaine with epinephrine. Appropriate trajectory was
planned with the use of a 22 gauge spinal needle. An 18 gauge trocar
needle was advanced into the abscess/fluid collection and a short
Amplatz super stiff wire was coiled within the collection.
Appropriate positioning was confirmed with a limited CT scan. The
tract was serially dilated allowing placement of a 10.2 French
all-purpose drainage catheter. Appropriate positioning was confirmed
with a limited postprocedural CT scan.

Approximately 50 ml of purulent fluid was aspirated. The tube was
connected to a drainage bag and sutured in place. A dressing was
placed. The patient tolerated the procedure well without immediate
post procedural complication.
IMPRESSION: Successful CT guided placement of a 10.2 French all purpose drain
catheter into the subhepatic fluid collection with aspiration of
approximately 50 mL of purulent fluid. Samples were sent to the
laboratory as requested by the ordering clinical team.

## 2020-10-03 MED ORDER — FENTANYL CITRATE (PF) 100 MCG/2ML IJ SOLN
INTRAMUSCULAR | Status: AC | PRN
  Administered 2020-10-03: 25 ug via INTRAVENOUS
  Administered 2020-10-03: 50 ug via INTRAVENOUS
  Administered 2020-10-03: 25 ug via INTRAVENOUS

## 2020-10-03 MED ORDER — LEVOFLOXACIN 750 MG PO TABS
750.0000 mg | ORAL_TABLET | Freq: Every day | ORAL | 0 refills | Status: DC
  Filled 2020-10-03: qty 21, 21d supply, fill #0

## 2020-10-03 MED ORDER — MIDAZOLAM HCL 2 MG/2ML IJ SOLN
INTRAMUSCULAR | Status: AC | PRN
Start: 2020-10-03 — End: 2020-10-03
  Administered 2020-10-03: 0.5 mg via INTRAVENOUS
  Administered 2020-10-03: 1 mg via INTRAVENOUS

## 2020-10-03 MED ORDER — METRONIDAZOLE 500 MG PO TABS
500.0000 mg | ORAL_TABLET | Freq: Two times a day (BID) | ORAL | 0 refills | Status: DC
  Filled 2020-10-03: qty 42, 21d supply, fill #0

## 2020-10-03 MED ORDER — SODIUM CHLORIDE 0.9% FLUSH
5.0000 mL | Freq: Three times a day (TID) | INTRAVENOUS | Status: DC
  Administered 2020-10-03 – 2020-10-05 (×6): 5 mL

## 2020-10-03 MED ORDER — FENTANYL CITRATE (PF) 100 MCG/2ML IJ SOLN
INTRAMUSCULAR | Status: AC
  Filled 2020-10-03: qty 4

## 2020-10-03 MED ORDER — MIDAZOLAM HCL 2 MG/2ML IJ SOLN
INTRAMUSCULAR | Status: AC
  Filled 2020-10-03: qty 4

## 2020-10-03 NOTE — Procedures (Signed)
Interventional Radiology Procedure Note  Procedure: CT guided abdominal drain placement  Findings: Please refer to procedural dictation for full description. Persistent subhepatic fluid collection with purulent drainage.  10.2 Fr pigtail drainage catheter placed via subxyphoid approach, placed to bag drainage.  Complications: None immediate  Estimated Blood Loss: < 5 mL  Recommendations: Keep to bag drainage.  Flush with saline TID.   IR will follow.   Ruthann Cancer, MD

## 2020-10-03 NOTE — H&P (Signed)
Chief Complaint: Patient was seen in consultation today for image guided aspiration and possible drain placement for intra-abdominal abscess Chief Complaint  Patient presents with  . Abdominal Pain  . Hypotension   at the request of Dr. West Bali, S.   Referring Physician(s): Dr. West Bali, S.   Supervising Physician: Ruthann Cancer  Patient Status: [facility] - In-pt  History of Present Illness: Nicole Browning is a 50 y.o. female with PMH of inflammatory bowel disease, GERD, anemia. Presented to the ED on 09/25/20 with abdominal pain, nausea and vomiting. Found to have hypotensive, AKI, leukocytosis,and elevated liver enzymes. CT showed a sigmoid mass with liver lesions, patient was admitted under care of [place] for further evaluation and management of acute renal failure and the sigmoid mass with liver lesions. Patient is known to IR service for previous aspiration of perihepatic ascites with Dr. Anselm Pancoast on 09/27/2020 and liver lesion biopsy with Dr. Earleen Newport on 10/01/2020.  Patient also underwent EGD with biopsy GI on 09/29/2020.  Patient underwent CT abdomen pelvis with contrast on 09/30/2020 which showed new loculated fluid collection within the anterior abdominal with peritoneal thickening/enhancement. So far, cytology from perihepatic fluid, pathology from EGD, and pathology from liver lesion were all negative for malignancy.  Pathology from the liver lesion showed liver parenchyma with necrotizing acute inflammation.  Culture from perihepatic abscess and hepatic lesion both grew moderate Streptococcus group F.   IR was requested by ID for image guided aspiration and drain placement for intra-abdominal fluid collection. Case was reviewed and approved for CT-guided aspiration and drain placement for the intra-abdominal fluid collection by Dr. Serafina Royals.  Patient was evaluated at the bedside. Patient laying in bed, not in acute distress.  Patient expressed frustration, states that she is a single mom and  has a 42 year old child who she needs to take care of.  She hopes that she will be discharged in the near future. Reports persistent abdominal pain.  Denise headache, fever, chills, shortness of breath, cough, chest pain, nausea ,vomiting, and bleeding.   Past Medical History:  Diagnosis Date  . Anemia   . GERD (gastroesophageal reflux disease)   . IBS (irritable bowel syndrome)   . Perirectal abscess   . Protein-calorie malnutrition ([place]) 02/29/2016  . Tobacco use   . UC (ulcerative colitis) [facility])     Past Surgical History:  Procedure Laterality Date  . BIOPSY  09/29/2020   Procedure: BIOPSY;  Surgeon: Jackquline Denmark, MD;  Location: [facility] ENDOSCOPY;  Service: Endoscopy;;  . ESOPHAGOGASTRODUODENOSCOPY (EGD) WITH PROPOFOL N/A 09/29/2020   Procedure: ESOPHAGOGASTRODUODENOSCOPY (EGD) WITH PROPOFOL;  Surgeon: Jackquline Denmark, MD;  Location: [facility] ENDOSCOPY;  Service: Endoscopy;  Laterality: N/A;  . IRRIGATION AND DEBRIDEMENT ABSCESS N/A 02/24/2016   Procedure: IRRIGATION AND DEBRIDEMENT ABSCESS;  Surgeon: Stark Klein, MD;  Location: [place];  Service: General;  Laterality: N/A;    Allergies: Bactrim [sulfamethoxazole-trimethoprim], Percocet [oxycodone-acetaminophen], and Valium [diazepam]  Medications: Prior to Admission medications   Medication Sig Start Date End Date Taking? Authorizing Provider  aspirin EC 325 MG tablet Take 325 mg by mouth every 6 (six) hours as needed for moderate pain.   Yes [provider]  bismuth subsalicylate (PEPTO BISMOL) 262 MG/15ML suspension Take 30 mLs by mouth every 6 (six) hours as needed for indigestion.   Yes [provider]  ibuprofen (ADVIL) 200 MG tablet Take 200 mg by mouth every 6 (six) hours as needed for moderate pain.   Yes [provider]  levofloxacin (LEVAQUIN) 750 MG tablet Take 1  tablet (750 mg total) by mouth daily for 21 days. 10/03/20 10/24/20 Yes Debbe Odea, MD  mesalamine (LIALDA) 1.2 g EC tablet Take 1 tablet (1.2  g total) by mouth in the morning and at bedtime. 07/11/20  Yes Noralyn Pick, NP  metroNIDAZOLE (FLAGYL) 500 MG tablet Take 1 tablet (500 mg total) by mouth 2 (two) times daily for 21 days. 10/03/20 10/24/20 Yes Debbe Odea, MD  Multiple Vitamins-Minerals (WOMENS MULTIVITAMIN PO) Take 1 tablet by mouth daily.   Yes [provider]  naproxen sodium (ALEVE) 220 MG tablet Take 220 mg by mouth daily as needed (pain).   Yes [provider]  POTASSIUM PO Take 1 tablet by mouth daily.   Yes [provider]  Vitamin D, Ergocalciferol, (DRISDOL) 1.25 MG (50000 UNIT) CAPS capsule Take 1 capsule (50,000 Units total) by mouth every 7 (seven) days. TAKE ONE CAPSULE BY MOUTH ONCE WEEKLY 07/22/20  Yes Noralyn Pick, NP  VITAMIN E PO Take 1 tablet by mouth daily.   Yes [provider]  doxycycline (VIBRAMYCIN) 100 MG capsule Take 1 capsule (100 mg total) by mouth 2 (two) times daily. Patient not taking: Reported on 09/25/2020 07/09/20   Scot Jun, FNP     Family History  Problem Relation Age of Onset  . Diabetes Mother   . COPD Father   . Leukemia Brother   . Colon cancer Neg Hx   . Colon polyps Neg Hx   . Esophageal cancer Neg Hx   . Rectal cancer Neg Hx   . Stomach cancer Neg Hx     Social History   Socioeconomic History  . Marital status: Single    Spouse name: Not on file  . Number of children: Not on file  . Years of education: Not on file  . Highest education level: Not on file  Occupational History  . Not on file  Tobacco Use  . Smoking status: Every Day    Packs/day: 0.33    Years: 28.00    Pack years: 9.24    Types: Cigarettes  . Smokeless tobacco: Never  Vaping Use  . Vaping Use: Never used  Substance and Sexual Activity  . Alcohol use: Yes    Alcohol/week: 2.0 standard drinks    Types: 2 Glasses of wine per week  . Drug use: No  . Sexual activity: Not Currently    Birth control/protection: Injection  Other Topics  Concern  . Not on file  Social History Narrative  . Not on file   Social Determinants of Health   Financial Resource Strain: Not on file  Food Insecurity: Not on file  Transportation Needs: Not on file  Physical Activity: Not on file  Stress: Not on file  Social Connections: Not on file     Review of Systems: A 12 point ROS discussed and pertinent positives are indicated in the HPI above.  All other systems are negative.   Vital Signs: BP 118/75 (BP Location: Right Arm)   Pulse 100   Temp 97.6 F (36.4 C) (Oral)   Resp 18   Ht 5' 4"  (1.626 m)   Wt 130 lb 11.7 oz (59.3 kg)   LMP  (LMP Unknown)   SpO2 99%   BMI 22.44 kg/m   Physical Exam Vitals reviewed.  Constitutional:      General: She is not in acute distress.    Appearance: She is well-developed. She is not ill-appearing.  HENT:     Head: Normocephalic  and atraumatic.  Cardiovascular:     Rate and Rhythm: Normal rate and regular rhythm.     Heart sounds: Normal heart sounds.  Pulmonary:     Effort: Pulmonary effort is normal.     Breath sounds: Normal breath sounds.  Abdominal:     General: Abdomen is flat.     Palpations: Abdomen is soft.  Skin:    General: Skin is warm and dry.     Coloration: Skin is not jaundiced.  Neurological:     Mental Status: She is alert and oriented to person, place, and time.  Psychiatric:        Mood and Affect: Mood normal.        Behavior: Behavior normal.    MD Evaluation Airway: WNL Heart: WNL Abdomen: WNL Chest/ Lungs: WNL ASA  Classification: 2 Mallampati/Airway Score: Two  Imaging: CT ABDOMEN PELVIS WO CONTRAST  Result Date: 09/25/2020 CLINICAL DATA:  Abdominal pain, acute nonlocalized abdominal pain associated with muscle spasms. EXAM: CT ABDOMEN AND PELVIS WITHOUT CONTRAST TECHNIQUE: Multidetector CT imaging of the abdomen and pelvis was performed following the standard protocol without IV contrast. COMPARISON:  February 24, 2016 FINDINGS: Lower chest:  Basilar atelectasis/early consolidation on the RIGHT. No effusion. Hepatobiliary: Diffuse central low attenuation in the liver spanning medial an lateral segment of LEFT hepatic lobe along the fissure for false form ligament measuring up to 9.3 x 4.3 cm in total. This appears either anterior 2 or also involving intrahepatic inferior vena cava tracking towards the confluence of the hepatic veins. No pericholecystic stranding. No additional lesions visualized. Pancreas: Not well visualized. Grossly unremarkable and without signs of inflammation. Spleen: Spleen normal size and contour though with serosal disease suspected along the anterior margin. See below. Adrenals/Urinary Tract: Adrenal glands are normal. Kidneys with smooth contours. No hydronephrosis. Urinary bladder is collapsed. Stomach/Bowel: Gastric thickening is suggested in the area of the gastric antrum with some perigastric stranding. Mild distension of small bowel loops in the abdomen without signs of gross obstruction. Colonic loops decompressed distal to the transverse colon. Suggestion of low-attenuation focus along the margin of the sigmoid (image 67/3) this is suspicious for serosal implant with an adjacent soft tissue nodule in the sigmoid mesentery (image 66/6) 2.3 x 1.5 cm. Mid sigmoid abnormality as discussed below. Visualized portions of the appendix are normal. Vascular/Lymphatic: Calcified atheromatous plaque, scattered throughout the normal caliber abdominal aorta. Smooth contour of the IVC. Scattered small lymph nodes in the retroperitoneum. None with pathologic enlargement. No gross adenopathy in the abdomen though with ill-defined soft tissue in the porta hepatis that may represent conglomerate adenopathy the largest potentially 13 mm short axis (image 28/3) Scattered lymph nodes adjacent to rectosigmoid colon best seen on coronal image 76, adjacent to either soft tissue implant or large lymph node. Adjacent to the dominant soft tissue  nodule adjacent to the colon there is question of colonic thickening from which this area arises. This is at the level of the mid sigmoid colon. Ovaries along the bilateral pelvic sidewalls. Reproductive: Ovaries along the bilateral pelvic sidewalls. No adnexal mass. Uterus unremarkable. Other: While there is no ascites. There is fascial thickening with nodularity, for example on image 28 of series 3 6 mm thickening of the abdominal fascia adjacent to the stomach. Nodularity tracking along the anterior and lateral transversalis and in the lateral conal fascia in the LEFT hemiabdomen. Indistinct appearance of the omental fat raising the question of omental disease. Musculoskeletal: No acute musculoskeletal findings. Signs of sacroiliitis  with asymmetric involvement of the RIGHT sacral iliac joint. No destructive bone finding or acute bone process. IMPRESSION: 1. 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 Hepatic MRI or CT with contrast may be helpful as the patient is able for further evaluation. 2. Question of gastric antral thickening.  Areas not well assessed. 3. Signs of sacroiliitis with asymmetric involvement of the RIGHT sacral iliac joint. Correlate with any clinical or laboratory evidence of seronegative spondyloarthropathy. 4. Juxta diaphragmatic airspace disease in the RIGHT chest favored to represent juxta diaphragmatic atelectasis. 5. Aortic atherosclerosis. Electronically Signed   By: Zetta Bills M.D.   On: 09/25/2020 19:04   CT CHEST W CONTRAST  Result Date: 09/27/2020 CLINICAL DATA:  Cancer of unknown origin. EXAM: CT CHEST WITH CONTRAST TECHNIQUE: Multidetector CT imaging of the chest was performed during intravenous contrast administration. CONTRAST:  58m OMNIPAQUE IOHEXOL 300 MG/ML  SOLN COMPARISON:  September 26, 2020.  September 25, 2020. FINDINGS: Cardiovascular: No significant vascular findings. Normal  heart size. No pericardial effusion. Mediastinum/Nodes: Thyroid gland is unremarkable. Possible 2 cm subcarinal lymph node is noted. Wall thickening of distal esophagus is noted concerning for esophagitis. Lungs/Pleura: No pneumothorax is noted. Small left pleural effusion is noted. Mild bilateral posterior basilar subsegmental atelectasis or infiltrates are noted. Upper Abdomen: As noted on prior CT, hepatic masses are noted concerning for malignancy or metastatic disease. Musculoskeletal: No chest wall abnormality. No acute or significant osseous findings. IMPRESSION: Small left pleural effusion is noted. Mild bilateral posterior basilar subsegmental atelectasis or infiltrates are noted. Possible 2 cm subcarinal lymph node is noted which may represent metastatic disease. Wall thickening of distal esophagus is noted concerning for esophagitis. Endoscopy is recommended for further evaluation. Hepatic masses are again noted as described on prior CT scan, concerning for malignancy or metastatic disease. Electronically Signed   By: JMarijo ConceptionM.D.   On: 09/27/2020 20:01   MR PELVIS WO CONTRAST  Addendum Date: 09/26/2020   ADDENDUM REPORT: 09/26/2020 19:50 ADDENDUM: These results were called by telephone at the time of interpretation on 09/26/2020 at 7:49 pm to provider hospitalist DR. SHALHOUB, who verbally acknowledged these results. Electronically Signed   By: JIlona SorrelM.D.   On: 09/26/2020 19:50   Result Date: 09/26/2020 CLINICAL DATA:  50year old female with acute abdominal pain. Indeterminate perisigmoid masses and liver masses on noncontrast CT from 1 day prior. By report, no sigmoid mass identified on colonoscopy performed in May 2022. EXAM: MRI PELVIS WITHOUT CONTRAST TECHNIQUE: Multiplanar multisequence MR imaging of the pelvis was performed. No intravenous contrast was administered. Study discontinued early at the patient's request, with intravenous contrast not administered and postcontrast  sequences not obtained. COMPARISON:  07/24/2020 MRI pelvis.  09/25/2020 CT abdomen/pelvis. FINDINGS: Urinary Tract:  Normal bladder and urethra. Bowel: There is a 3.0 x 2.1 x 2.5 cm mildly T2 hyperintense mass in the posterior left pelvis adherent to the serosa of the sigmoid colon (series 5/image 4), with the suggestion of a tiny amount of nondependent internal gas. There is a separate nearby 2.2 x 1.7 x 1.3 cm T2 hyperintense mass in the sigmoid mesentery (series 5/image 1). There is a separate more anteriorly located thick walled 4.3 x 2.9 x 2.8 cm collection adherent to the sigmoid serosa with air-fluid level (series 3/image 21). There is mild wall thickening of the mid sigmoid colon. Vascular/Lymphatic: No appreciable acute vascular abnormality on this noncontrast scan. No pathologically enlarged  inguinal nodes. Reproductive: Normal size anteverted uterus measuring 6.2 x 3.1 x 3.9 cm. No uterine fibroids. Thin endometrium (2 mm thickness) with no discrete endometrial mass and no endometrial cavity fluid. No discrete mass of the uterine cervix, noting this study is not tailored for evaluation of the uterus. The ovaries appear symmetric and small (series 6/image 3 on the right and image 5 on the left). Other: Mild thickening of the bilateral pelvic peritoneum (series 7/image 13 on the right and image 14 on the left). No pelvic ascites. Musculoskeletal: No aggressive appearing focal osseous lesions. IMPRESSION: 1. Limited noncontrast MRI pelvis study, discontinued early at the patient's request. 2. Thick-walled 4.3 x 2.9 x 2.8 cm collection adherent to the proximal sigmoid serosa in the left anterior pelvis with air-fluid level. Separate 3.0 x 2.1 x 2.5 cm T2 hyperintense mass in the posterior left pelvis adherent to the sigmoid serosa with suggestion of a tiny amount of nondependent gas. Separate 2.2 cm T2 hyperintense mass in the sigmoid mesentery. These findings are indeterminate, with differential including  gas-containing pelvic abscesses versus peritoneal metastatic disease of uncertain primary. Fistulous communication to the sigmoid colon not excluded. Surgical consultation suggested. 3. Mild thickening of the bilateral pelvic peritoneum, nonspecific, differential includes peritonitis vs. peritoneal carcinomatosis. Electronically Signed: By: Ilona Sorrel M.D. On: 09/26/2020 19:35   MR ABDOMEN WO CONTRAST  Addendum Date: 09/26/2020   ADDENDUM REPORT: 09/26/2020 19:49 ADDENDUM: These results were called by telephone at the time of interpretation on 09/26/2020 at 7:46 pm to provider DR. SHALHOUB, who verbally acknowledged these results. Upon further discussion with Dr. Cyd Silence, the patient is exhibiting clinical findings of sepsis with an elevated WBC count. The differential for the liver masses includes liver abscesses in addition to metastatic disease. Additionally the differential for the peritoneal thickening includes infectious peritonitis in addition to carcinomatosis. IR consultation for consideration of percutaneous biopsy/drainage of these liver lesions is suggested. Electronically Signed   By: Ilona Sorrel M.D.   On: 09/26/2020 19:49   Result Date: 09/26/2020 CLINICAL DATA:  Crohn disease. Indeterminate liver masses on unenhanced CT from earlier today. EXAM: MRI ABDOMEN WITHOUT CONTRAST TECHNIQUE: Multiplanar multisequence MR imaging was performed without the administration of intravenous contrast. Patient unable to tolerate full exam or IV contrast. Only axial and coronal T2 weighted imaging obtained. COMPARISON:  09/25/2020 CT abdomen/pelvis. FINDINGS: Motion degraded incomplete MRI abdomen study performed without IV contrast. Lower chest: Streaky curvilinear opacities at the dependent right greater than left lung bases bilaterally, not well evaluated by MRI. Hepatobiliary: Similar heterogeneous T2 signal intensity liver masses measuring 5.6 x 4.7 cm in the caudate lobe (series 15/image 16) and 5.3 x  4.0 cm in the posterior left liver (series 15/image 19). Normal gallbladder with no cholelithiasis. Mild segmental intrahepatic biliary ductal dilatation in the lateral segment left liver lobe. No significant intrahepatic biliary ductal dilatation in the right liver. Common bile duct diameter 3 mm. No choledocholithiasis. No appreciable biliary masses or strictures. Pancreas: No pancreatic mass or duct dilation.  No pancreas divisum. Spleen: Normal size. No mass. Adrenals/Urinary Tract: Normal adrenals. No hydronephrosis. Simple appearing homogeneous T2 hyperintense left renal cysts, largest 2.3 cm in the lower left kidney. No overtly suspicious renal masses. Stomach/Bowel: Normal non-distended stomach. Visualized small and large bowel is normal caliber, with no bowel wall thickening. Vascular/Lymphatic: Nonaneurysmal abdominal aorta. No pathologically enlarged lymph nodes in the abdomen. Other: Trace abdominal ascites. Scattered mild generalized peritoneal thickening, for example in the right pericolic gutter (series 41/RAXEN 34)  and left upper quadrant (series 15/image 25). No focal drainable fluid collection. Musculoskeletal: No aggressive appearing focal osseous lesions. IMPRESSION: 1. Limited motion degraded incomplete MRI abdomen study performed without IV contrast, discontinued prior to study completion at the patient's request. 2. Two similar indeterminate heterogeneous liver masses measuring 5.6 cm in the caudate lobe and 5.3 cm in the posterior left liver, incompletely evaluated. Metastatic disease not excluded. Consider IR consultation for potential ultrasound-guided liver biopsy. If clinically feasible, liver protocol CT abdomen and pelvis without and with IV contrast may be considered for further evaluation prior to potential biopsy. 3. Mild generalized peritoneal thickening. Trace abdominal ascites. These findings are nonspecific but raise concern for peritoneal carcinomatosis, as described on the  noncontrast CT study from 1 day prior. 4. Streaky curvilinear opacities at the dependent right greater than left lung bases, not well evaluated by MRI. Chest CT may be obtained for further evaluation as clinically warranted. Electronically Signed: By: Ilona Sorrel M.D. On: 09/26/2020 18:25   CT ABDOMEN PELVIS W CONTRAST  Result Date: 09/30/2020 CLINICAL DATA:  Chronic abdominal pain. Prior CT suspicious for sigmoid neoplasm. History of Crohn disease. EXAM: CT ABDOMEN AND PELVIS WITH CONTRAST TECHNIQUE: Multidetector CT imaging of the abdomen and pelvis was performed using the standard protocol following bolus administration of intravenous contrast. CONTRAST:  17m OMNIPAQUE IOHEXOL 300 MG/ML  SOLN COMPARISON:  Abdominopelvic CT of 09/25/2020. Pelvic abdominopelvic MRI 09/26/2020. FINDINGS: Lower chest: Worsened bibasilar aeration with development of right greater than left base airspace disease. Normal heart size with new tiny bilateral pleural effusions. Hepatobiliary: Capsular based masses within the liver may be parenchymal or peritoneal. Example within the caudate lobe at 6.3 x 6.1 cm on 22/3 and the posterior aspect of segment 2-3 at 6.0 x 4.3 cm on 27/3. Gallbladder wall thickening is nonspecific. No calcified stone or biliary duct dilatation. Pancreas: Normal, without mass or ductal dilatation. Spleen: Normal in size, without focal abnormality. Adrenals/Urinary Tract: Normal adrenal glands. Left renal sinus cyst an interpolar too small to characterize lesion. Normal right kidney. Normal urinary bladder. Stomach/Bowel: Portions of the stomach are decompressed and therefore likely secondarily appear thick walled.The sigmoid is moderately thick walled, including on 71/3. Normal terminal ileum and appendix. Normal small bowel caliber. No complicating obstruction. Vascular/Lymphatic: Aortic atherosclerosis. No abdominopelvic adenopathy. Porta hepatis nodes of up to 10 mm are not pathologic by size criteria.  Reproductive: Normal uterus and adnexa. Other: Development of small volume ascites with loculated fluid collections since 09/25/2020. Example collection with peritoneal thickening and hyperenhancement within the anterior abdomen at 13.8 x 4.3 cm on 42/3. A complex fluid and gas collection cephalad to the thickened sigmoid measures 3.7 x 2.8 cm on 67/3 and is similar to 09/25/2020. Inferior to this thickened sigmoid is a fluid collection as detailed on recent MRI with minimal gas within. Example 3.1 x 2.4 cm on 74/3. No free intraperitoneal air. Presumably iatrogenic nodularity about the anterior abdominal wall. Musculoskeletal: Right sacroiliac joint sclerosis suggest sacroiliitis. IMPRESSION: 1. Findings which are again suspicious for metastatic disease to the peritoneum and possibly hepatic parenchyma. Dominant capsular based liver masses have been detailed previously. A new loculated fluid collection within the anterior abdomen with peritoneal thickening/enhancement. Differential considerations include infected ascites/developing abscess or peritoneal carcinomatosis. Correlate with paracentesis results of 1 day prior. 2. Sigmoid wall thickening which could represent the site of primary neoplasm. Pericolonic complex fluid collections could represent developing abscesses, and were detailed on the prior pelvic MRI. 3. No bowel obstruction or  other acute complication. 4. New small bilateral pleural effusions with adjacent atelectasis or infection. 5. Apparent gastric wall thickening, at least partially felt to be due to underdistention. Electronically Signed   By: Abigail Miyamoto M.D.   On: 09/30/2020 14:22   US BIOPSY (LIVER)  Result Date: 10/01/2020 INDICATION: 50 year old female with a history of liver masses, known inflammatory bowel disease, admitted for sepsis. EXAM: ULTRASOUND-GUIDED LIVER MASS BIOPSY ULTRASOUND-GUIDED LIVER MASS CULTURE/ASPIRATION MEDICATIONS: None. ANESTHESIA/SEDATION: Moderate (conscious)  sedation was employed during this procedure. A total of Versed 2.0 mg and Fentanyl 75 mcg was administered intravenously. Moderate Sedation Time: 14 minutes. The patient's level of consciousness and vital signs were monitored continuously by radiology nursing throughout the procedure under my direct supervision. FLUOROSCOPY TIME:  Ultrasound COMPLICATIONS: None PROCEDURE: Informed written consent was obtained from the patient after a thorough discussion of the procedural risks, benefits and alternatives. All questions were addressed. Maximal Sterile Barrier Technique was utilized including caps, mask, sterile gowns, sterile gloves, sterile drape, hand hygiene and skin antiseptic. A timeout was performed prior to the initiation of the procedure. Ultrasound survey of the right liver lobe performed with images stored and sent to PACs. The subxiphoid region was prepped with chlorhexidine in a sterile fashion, and a sterile drape was applied covering the operative field. A sterile gown and sterile gloves were used for the procedure. Local anesthesia was provided with 1% Lidocaine. The patient was prepped and draped sterilely and the skin and subcutaneous tissues were generously infiltrated with 1% lidocaine. A 17 gauge introducer needle was then advanced under ultrasound guidance in subxiphoid location into the left liver, targeting the heterogeneous mass in the posterior left liver. The stylet was removed, and multiple separate 18 gauge core biopsy were retrieved. Samples were placed into both formalin solution, as well as sterile saline solution. The needle was then aspirated upon withdrawal for a liquid culture to be sent to the lab. Final ultrasound image was performed. The patient tolerated the procedure well and remained hemodynamically stable throughout. No complications were encountered and no significant blood loss was encounter. FINDINGS: Ultrasound demonstrates solid appearance of both right liver mass and  left liver mass, with no ultrasound correlate of what appears to be some septations and fluid on the prior MR and CT imaging. IMPRESSION: Status post ultrasound-guided left liver mass biopsy and liver mass culture/aspiration, as diagnostic test for possible metastatic lesion versus infection Signed, Dulcy Fanny. Dellia Nims, RPVI [facility] Radiology Electronically Signed   By: Corrie Mckusick D.O.   On: 10/01/2020 10:08   US Paracentesis  Result Date: 09/27/2020 INDICATION: 50 year old with indeterminate liver lesions and scheduled for ultrasound-guided liver aspiration or biopsy. EXAM: ULTRASOUND-GUIDED PARACENTESIS COMPARISON:  None. MEDICATIONS: Versed 1.5 mg, fentanyl 50 mcg. Patient was monitored by radiology nurse throughout the procedure. Sedation time: 19 minutes COMPLICATIONS: None immediate. TECHNIQUE: The procedure was explained to the patient. The risks and benefits of the procedure were discussed and the patient's questions were addressed. Informed consent was obtained from the patient. The abdomen was evaluated with ultrasound and perihepatic ascites was identified. Consent was also obtained for a paracentesis. Decision was made to perform paracentesis prior to biopsy. The anterior abdomen was prepped with chlorhexidine and sterile field was created. Skin was anesthetized using 1% lidocaine. A 19 gauge Yueh catheter was directed into the left upper abdominal fluid collection using ultrasound guidance. Opaque brown fluid was aspirated. Approximately 100 mL of fluid was removed. Based on the appearance  of the fluid, liver biopsy was not performed at this time. Fluid was sent for culture and cytology. Bandage placed over the puncture site. FINDINGS: Small amount of perihepatic ascites along the left side of the abdomen. 100 mL of opaque brown fluid was removed. The lesions in the caudate and posterior left hepatic lobe were identified. The caudate lesion is  more conspicuous. The posterior left hepatic lobe lesion is a subtle heterogeneous lesion. IMPRESSION: Successful ultrasound-guided paracentesis yielding 100 mL brown fluid from the left upper abdomen. Ultrasound-guided liver biopsy was not performed at this time. Biopsy to the posterior left hepatic lobe appears to be feasible if needed in the future. Electronically Signed   By: Markus Daft M.D.   On: 09/27/2020 15:35   ECHOCARDIOGRAM COMPLETE  Result Date: 10/01/2020    ECHOCARDIOGRAM REPORT   Patient Name:   Nicole Browning Date of Exam: 10/01/2020 Medical Rec #:  [id number]      Height:       64.0 in Accession #:    [id number]     Weight:       125.0 lb Date of Birth:  1971/03/03      BSA:          1.602 m Patient Age:    10 years       BP:           126/83 mmHg Patient Gender: F              HR:           96 bpm. Exam Location:  Inpatient Procedure: 2D Echo, Cardiac Doppler and Color Doppler Indications:    Bacteremia  History:        Patient has no prior history of Echocardiogram examinations.                 Arrythmias:Tachycardia. ARF, Anemia, Liver mass.  Sonographer:    Dustin Flock Referring Phys: [id number] Casey  1. Left ventricular ejection fraction, by estimation, is 60 to 65%. The left ventricle has normal function. The left ventricle has no regional wall motion abnormalities. Left ventricular diastolic parameters were normal.  2. Right ventricular systolic function is normal. The right ventricular size is normal. There is mildly elevated pulmonary artery systolic pressure. The estimated right ventricular systolic pressure is 66.4 mmHg.  3. The mitral valve is normal in structure. No evidence of mitral valve regurgitation.  4. The aortic valve is tricuspid. Aortic valve regurgitation is not visualized. No aortic stenosis is present. FINDINGS  Left Ventricle: Left ventricular ejection fraction, by estimation, is 60 to 65%. The left ventricle has normal function. The left ventricle  has no regional wall motion abnormalities. The left ventricular internal cavity size was normal in size. There is  no left ventricular hypertrophy. Left ventricular diastolic parameters were normal. Right Ventricle: The right ventricular size is normal. No increase in right ventricular wall thickness. Right ventricular systolic function is normal. There is mildly elevated pulmonary artery systolic pressure. The tricuspid regurgitant velocity is 2.89  m/s, and with an assumed right atrial pressure of 3 mmHg, the estimated right ventricular systolic pressure is 40.3 mmHg. Left Atrium: Left atrial size was normal in size. Right Atrium: Right atrial size was normal in size. Pericardium: There is no evidence of pericardial effusion. Mitral Valve: The mitral valve is normal in structure. No evidence of mitral valve regurgitation. Tricuspid Valve: The tricuspid valve is normal in structure. Tricuspid valve regurgitation is trivial. Aortic Valve: The  aortic valve is tricuspid. Aortic valve regurgitation is not visualized. No aortic stenosis is present. Pulmonic Valve: The pulmonic valve was not well visualized. Pulmonic valve regurgitation is not visualized. Aorta: The aortic root is normal in size and structure. IAS/Shunts: The interatrial septum was not well visualized.  LEFT VENTRICLE PLAX 2D LVIDd:         4.80 cm  Diastology LVIDs:         2.90 cm  LV e' medial:    7.18 cm/s LV PW:         0.80 cm  LV E/e' medial:  15.5 LV IVS:        0.80 cm  LV e' lateral:   13.60 cm/s LVOT diam:     1.90 cm  LV E/e' lateral: 8.2 LV SV:         54 LV SV Index:   34 LVOT Area:     2.84 cm  RIGHT VENTRICLE RV Basal diam:  2.60 cm RV S prime:     13.30 cm/s TAPSE (M-mode): 1.6 cm LEFT ATRIUM             Index       RIGHT ATRIUM           Index LA diam:        3.00 cm 1.87 cm/m  RA Area:     11.10 cm LA Vol (A2C):   37.9 ml 23.66 ml/m RA Volume:   22.20 ml  13.86 ml/m LA Vol (A4C):   32.7 ml 20.41 ml/m LA Biplane Vol: 35.5 ml 22.16  ml/m  AORTIC VALVE LVOT Vmax:   105.00 cm/s LVOT Vmean:  69.900 cm/s LVOT VTI:    0.190 m  AORTA Ao Root diam: 2.20 cm MITRAL VALVE                TRICUSPID VALVE MV Area (PHT): 7.29 cm     TR Peak grad:   33.4 mmHg MV Decel Time: 104 msec     TR Vmax:        289.00 cm/s MV E velocity: 111.00 cm/s MV A velocity: 91.20 cm/s   SHUNTS MV E/A ratio:  1.22         Systemic VTI:  0.19 m                             Systemic Diam: 1.90 cm Oswaldo Milian MD Electronically signed by Oswaldo Milian MD Signature Date/Time: 10/01/2020/5:11:46 PM    Final     Labs:  CBC: Recent Labs    09/30/20 0239 10/01/20 0008 10/02/20 0543 10/03/20 0342  WBC 20.0* 18.2* 15.5* 11.6*  HGB 8.1* 7.8* 7.3* 7.6*  HCT 24.2* 24.1* 22.4* 23.1*  PLT 397 497* 450* 485*    COAGS: Recent Labs    09/27/20 0725 10/01/20 0008  INR 1.2 1.2    BMP: Recent Labs    09/30/20 0239 10/01/20 0008 10/02/20 0543 10/03/20 0342  NA 134* 134* 133* 134*  K 4.5 4.3 4.3 4.4  CL 101 101 100 99  CO2 27 27 24 26   GLUCOSE 151* 130* 97 99  BUN 6 <5* <5* <5*  CALCIUM 8.8* 8.7* 8.6* 8.7*  CREATININE 0.76 0.69 0.66 0.75  GFRNONAA >60 >60 >60 >60    LIVER FUNCTION TESTS: Recent Labs    09/30/20 0239 10/01/20 0008 10/02/20 0543 10/03/20 0342  BILITOT 0.5 0.3 0.9 0.5  AST 28 26 34 34  ALT 63* 56* 54* 53*  ALKPHOS 75 76 58 63  PROT 5.9* 6.3* 6.3* 6.4*  ALBUMIN 1.6* 1.7* 1.7* 1.8*    TUMOR MARKERS: No results for input(s): AFPTM, CEA, CA199, CHROMGRNA in the last 8760 hours.  Assessment and Plan: 50 y.o. female known to IR service for previous aspiration of perihepatic ascites with Dr. Anselm Pancoast on 09/27/2020 and liver lesion biopsy with Dr. Earleen Newport on 10/01/2020.  Patient underwent CT abdomen pelvis with contrast on 09/30/2020 which showed new loculated fluid collection within the anterior abdominal with peritoneal thickening/enhancement.  IR was requested by ID for image guided aspiration and drain placement for the  intra-abdominal fluid collection. Case was reviewed and approved for CT-guided aspiration and drain placement by Dr. Serafina Royals. Has been n.p.o. since 07:30 today VSS CBC today showed WBC trending down, 11.6 today, Hgb 7.6, PLT 485. Liver function with elevated ALT, has been trending down for 6 days. INR 1.2 on 10/01/2020 Not on anticoagulation/antiplatelet treatment.  Risks and benefits discussed with the patient including bleeding, infection, damage to adjacent structures, bowel perforation/fistula connection, and sepsis.  All of the patient's questions were answered, patient is agreeable to proceed. Consent signed and in chart.   Thank you for this interesting consult.  I greatly enjoyed meeting Nicole Browning and look forward to participating in their care.  A copy of this report was sent to the requesting provider on this date.  Electronically Signed: Tera Mater, PA-C 10/03/2020, 1:47 PM   I spent a total of 40 Minutes   in face to face in clinical consultation, greater than 50% of which was counseling/coordinating care for aspiration and possible drain placement for intra-abdominal fluid collection.

## 2020-10-03 NOTE — Progress Notes (Signed)
Liver biopsy results noted.  Still indeterminate, but may be related to infection although malignancy still can't be ruled out.  ID has seen patient and made some abx recommendations and adjustments.  No surgical plans or further recommendations.  We will sign off at this time.  Henreitta Cea 7:53 AM 10/03/2020

## 2020-10-03 NOTE — Progress Notes (Addendum)
ID Brief Note  I discussed with IR this morning about possible drainage of multiple intraabdominal fluid collections where possible to help with source control. Please send sample for gram stain and routine aerobic and anaerobic cultures   I later discussed with patient regarding IV vs PO antibiotics and preferably  IV better given complex fluid collections in her abdomen.  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  In that case, will plan for PO antibiotics like Levofloxacin + Metronidazole for approx 3-4 weeks when ready to go home with a need for fu imaging.   Plan discussed with patient/ID pharmacy and Primary team   [name], MD Infectious Disease Physician [facility] [address] [place], [place] [address] Phone: [phone]  Fax: [phone]

## 2020-10-03 NOTE — Progress Notes (Signed)
Daily Rounding Note  10/03/2020, 11:33 AM  LOS: 7 days   SUBJECTIVE:   Chief complaint:    liver, peritoneal, perisigmoid masses.  Abd pain Abd pain better but flares if she eats too much.  No n/V.  Stools loose, brown w/O blood.   Pt concerned that if she is not able to return to work by next week, she will lose her job.     OBJECTIVE:         Vital signs in last 24 hours:    Temp:  [97.6 F (36.4 C)-98.6 F (37 C)] 97.6 F (36.4 C) (08/03 0833) Pulse Rate:  [97-106] 100 (08/03 0833) Resp:  [16-18] 18 (08/03 0833) BP: (118-125)/(65-90) 118/75 (08/03 0833) SpO2:  [98 %-100 %] 99 % (08/03 0833) Last BM Date: 09/28/20 Filed Weights   09/29/20 0912 09/30/20 0500 10/02/20 0606  Weight: 56.7 kg 56.7 kg 59.3 kg   General: looks well.  Comfortable.     Heart: RRR Chest: clear bil.   Abdomen: soft, mild  tenderness diffusely.  No G/R.  Normal, active BS  Extremities: no CCE Neuro/Psych:  alert.  Appropriate, fluid speech.  No confusion.    Intake/Output from previous day: 08/02 0701 - 08/03 0700 In: 968.2 [P.O.:240; I.V.:328.2; IV Piggyback:400] Out: 1501 [Urine:1500; Stool:1]  Intake/Output this shift: Total I/O In: 240 [P.O.:240] Out: -   Lab Results: Recent Labs    10/01/20 0008 10/02/20 0543 10/03/20 0342  WBC 18.2* 15.5* 11.6*  HGB 7.8* 7.3* 7.6*  HCT 24.1* 22.4* 23.1*  PLT 497* 450* 485*   BMET Recent Labs    10/01/20 0008 10/02/20 0543 10/03/20 0342  NA 134* 133* 134*  K 4.3 4.3 4.4  CL 101 100 99  CO2 27 24 26   GLUCOSE 130* 97 99  BUN <5* <5* <5*  CREATININE 0.69 0.66 0.75  CALCIUM 8.7* 8.6* 8.7*   LFT Recent Labs    10/01/20 0008 10/02/20 0543 10/03/20 0342  PROT 6.3* 6.3* 6.4*  ALBUMIN 1.7* 1.7* 1.8*  AST 26 34 34  ALT 56* 54* 53*  ALKPHOS 76 58 63  BILITOT 0.3 0.9 0.5   PT/INR Recent Labs    10/01/20 0008  LABPROT 15.3*  INR 1.2   Hepatitis Panel No results for  input(s): HEPBSAG, HCVAB, HEPAIGM, HEPBIGM in the last 72 hours.  Studies/Results: ECHOCARDIOGRAM COMPLETE  Result Date: 10/01/2020 IMPRESSIONS  1. Left ventricular ejection fraction, by estimation, is 60 to 65%. The left ventricle has normal function. The left ventricle has no regional wall motion abnormalities. Left ventricular diastolic parameters were normal.  2. Right ventricular systolic function is normal. The right ventricular size is normal. There is mildly elevated pulmonary artery systolic pressure. The estimated right ventricular systolic pressure is 32.9 mmHg.  3. The mitral valve is normal in structure. No evidence of mitral valve regurgitation.  4. The aortic valve is tricuspid. Aortic valve regurgitation is not visualized. No aortic stenosis is present. Electronically signed by Oswaldo Milian MD Signature Date/Time: 10/01/2020/5:11:46 PM    Final     ASSESMENT:   Perisigmoid, abdominal, liver masses.  Group F strep peritonitis and bacteremia.  Indeterminant colitis, Crohn's favored.  Ascitic fluid 7/28 analysis C/W infection rather than lymphoproliferative neoplasm.  Liver biopsy 8/1 with necrotizing inflammation, S/O abscess but cannot exclude malignancy.  Downtrending WBCs on metronidazole/levofloxacin day 3, previous ceftriaxone and Zosyn.  ID involved in management.  06/22/2020 colonoscopy with mild pancolitis and ileitis.  Initiated Lialda 2.4 g daily then.     Normocytic anemia.  Hgb 7.6.  No PRBC to date.    Sacroiliitis per CT imaging, adds to evidence supporting diagnosis of Crohn's disease/IBD.  Elevated LFTs in setting of liver lesions, abscesses suspected.  LFTs improving steadily.    PLAN     Has slot for US guided imaging and assessment for drainage of fluid collections.      Azucena Freed  10/03/2020, 11:33 AM Phone [phone]

## 2020-10-03 NOTE — Plan of Care (Signed)
  Problem: Health Behavior/Discharge Planning: Goal: Ability to manage health-related needs will improve Outcome: Progressing   Problem: Clinical Measurements: Goal: Diagnostic test results will improve Outcome: Progressing   Problem: Nutrition: Goal: Adequate nutrition will be maintained Outcome: Progressing   Problem: Coping: Goal: Level of anxiety will decrease Outcome: Progressing

## 2020-10-03 NOTE — Progress Notes (Addendum)
PROGRESS NOTE    Nicole Browning   KPV:[id number]  DOB: 1970-10-07  DOA: 09/25/2020 PCP: Kerin Perna, NP   Brief Narrative:  Nicole Browning is a 50 year old female with recent diagnosis of IBD in April 2022 presented with abdominal pain, nausea and vomiting.  On presentation, she was slightly hypotensive which improved with IV fluids.  Creatinine was elevated at 2.54 along with AST of 133, ALT of 231 and total bilirubin of 0.8.  CT of the abdomen and pelvis showed features concerning for sigmoid mass with possible hepatic metastasis.  She was given a dose of IV Rocephin in the ED for possible UTI.  GI was consulted.  Patient underwent ultrasound-guided paracentesis and antibiotics were started for possible infected ascites versus liver abscess.  General surgery was consulted by GI.  She had EGD on 09/29/2020 which showed erosive gastritis.  She underwent liver biopsy on 10/01/2020 by IR.   Subjective: No new complaints.     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 - s/p IR drainage of subhepatic fluid collection and pigtail cath today  Active Problems:   AKI (acute kidney injury) ([place]) - Cr 2.54- has improved to normal with IVF  Erosive esophagitis - note don EGD -cont PPI    IDA (iron deficiency anemia) - follows with oncology and was prescribed oral iron in May, 22    IBD (inflammatory bowel disease) - cont Mesalamine    Protein-calorie malnutrition, severe  Interventions: MVI, Ensure Enlive (each supplement provides 350kcal and 20 grams of protein)    Time spent in minutes: 35 DVT prophylaxis: SCDs Start: 09/25/20 2329  Code Status: full code Family Communication:  Level of Care: Level of care:  Med-Surg Disposition Plan:  Status is: Inpatient  Remains inpatient appropriate because:IV treatments appropriate due to intensity of illness or inability to take PO  Dispo: The patient is from: Home              Anticipated d/c is to: Home              Patient currently is not medically stable to d/c.   Difficult to place patient No      Consultants:  GI IR Gen surg Procedures:  IR aspiration EGD Antimicrobials:  Anti-infectives (From admission, onward)    Start     Dose/Rate Route Frequency Ordered Stop   10/03/20 0000  levofloxacin (LEVAQUIN) 750 MG tablet        750 mg Oral Daily 10/03/20 1147 10/24/20 2359   10/03/20 0000  metroNIDAZOLE (FLAGYL) 500 MG tablet        500 mg Oral 2 times daily 10/03/20 1147 10/24/20 2359   10/02/20 1700  cefTRIAXone (ROCEPHIN) 2 g in sodium chloride 0.9 % 100 mL IVPB        2 g 200 mL/hr over 30 Minutes Intravenous Every 24 hours 10/02/20 1336     10/01/20 1700  levofloxacin (LEVAQUIN) IVPB 750 mg  Status:  Discontinued        750 mg 100 mL/hr over 90 Minutes Intravenous Every 24 hours 10/01/20 1156 10/02/20 1336   10/01/20 1600  metroNIDAZOLE (FLAGYL) IVPB 500 mg        500 mg 100 mL/hr over 60 Minutes  Intravenous Every 8 hours 10/01/20 1156     09/27/20 0145  piperacillin-tazobactam (ZOSYN) IVPB 3.375 g  Status:  Discontinued        3.375 g 12.5 mL/hr over 240 Minutes Intravenous Every 8 hours 09/27/20 0057 10/01/20 1156   09/26/20 2200  cefTRIAXone (ROCEPHIN) 1 g in sodium chloride 0.9 % 100 mL IVPB  Status:  Discontinued        1 g 200 mL/hr over 30 Minutes Intravenous Every 24 hours 09/25/20 2331 09/26/20 0617   09/25/20 2300  cefTRIAXone (ROCEPHIN) 1 g in sodium chloride 0.9 % 100 mL IVPB        1 g 200 mL/hr over 30 Minutes Intravenous  Once 09/25/20 2245 09/25/20 2353        Objective: Vitals:   10/03/20 1615 10/03/20 1620 10/03/20 1625 10/03/20 1635  BP: 121/87 119/78 115/84 121/85  Pulse: 100 100 94 96  Resp: 20  (!) 22 20 20   Temp:      TempSrc:      SpO2: 100% 100% 98% 98%  Weight:      Height:        Intake/Output Summary (Last 24 hours) at 10/03/2020 1655 Last data filed at 10/03/2020 1640 Gross per 24 hour  Intake 701.39 ml  Output 1523 ml  Net -821.61 ml   Filed Weights   09/29/20 0912 09/30/20 0500 10/02/20 0606  Weight: 56.7 kg 56.7 kg 59.3 kg    Examination: General exam: Appears comfortable  HEENT: PERRLA, oral mucosa moist, no sclera icterus or thrush Respiratory system: Clear to auscultation. Respiratory effort normal. Cardiovascular system: S1 & S2 heard, RRR.   Gastrointestinal system: Abdomen soft,  -tender, nondistended. Normal bowel sounds. Central nervous system: Alert and oriented. No focal neurological deficits. Extremities: No cyanosis, clubbing or edema Skin: No rashes or ulcers Psychiatry:  Mood & affect appropriate.     Data Reviewed: I have personally reviewed following labs and imaging studies  CBC: Recent Labs  Lab 09/28/20 0348 09/29/20 0224 09/30/20 0239 10/01/20 0008 10/02/20 0543 10/03/20 0342  WBC 11.5* 16.7* 20.0* 18.2* 15.5* 11.6*  NEUTROABS 8.1* 12.0* 16.4*  --  13.3* 9.4*  HGB 8.4* 8.2* 8.1* 7.8* 7.3* 7.6*  HCT 25.4* 24.4* 24.2* 24.1* 22.4* 23.1*  MCV 81.7 81.3 81.8 82.8 83.6 83.7  PLT 322 353 397 497* 450* 203*   Basic Metabolic Panel: Recent Labs  Lab 09/29/20 0224 09/30/20 0239 10/01/20 0008 10/02/20 0543 10/03/20 0342  NA 136 134* 134* 133* 134*  K 4.1 4.5 4.3 4.3 4.4  CL 103 101 101 100 99  CO2 26 27 27 24 26   GLUCOSE 110* 151* 130* 97 99  BUN 5* 6 <5* <5* <5*  CREATININE 0.66 0.76 0.69 0.66 0.75  CALCIUM 8.7* 8.8* 8.7* 8.6* 8.7*  MG 1.7 1.7 1.9 1.7 1.9   GFR: Estimated Creatinine Clearance: 72.6 mL/min (by C-G formula based on SCr of 0.75 mg/dL). Liver Function Tests: Recent Labs  Lab 09/29/20 0224 09/30/20 0239 10/01/20 0008 10/02/20 0543 10/03/20 0342  AST 35 28 26 34 34  ALT 77* 63* 56* 54* 53*  ALKPHOS  71 75 76 58 63  BILITOT 0.9 0.5 0.3 0.9 0.5  PROT 5.7* 5.9* 6.3* 6.3* 6.4*  ALBUMIN 1.6* 1.6* 1.7* 1.7* 1.8*   No results for input(s): LIPASE, AMYLASE in the last 168 hours. No results for input(s): AMMONIA in the last 168 hours. Coagulation Profile: Recent Labs  Lab 09/27/20 0725 10/01/20 0008  INR 1.2 1.2  Cardiac Enzymes: No results for input(s): CKTOTAL, CKMB, CKMBINDEX, TROPONINI in the last 168 hours. BNP (last 3 results) No results for input(s): PROBNP in the last 8760 hours. HbA1C: No results for input(s): HGBA1C in the last 72 hours. CBG: No results for input(s): GLUCAP in the last 168 hours. Lipid Profile: No results for input(s): CHOL, HDL, LDLCALC, TRIG, CHOLHDL, LDLDIRECT in the last 72 hours. Thyroid Function Tests: No results for input(s): TSH, T4TOTAL, FREET4, T3FREE, THYROIDAB in the last 72 hours. Anemia Panel: No results for input(s): VITAMINB12, FOLATE, FERRITIN, TIBC, IRON, RETICCTPCT in the last 72 hours. Urine analysis:    Component Value Date/Time   COLORURINE AMBER (A) 09/25/2020 2021   APPEARANCEUR CLOUDY (A) 09/25/2020 2021   LABSPEC 1.021 09/25/2020 2021   PHURINE 5.0 09/25/2020 2021   GLUCOSEU NEGATIVE 09/25/2020 2021   HGBUR MODERATE (A) 09/25/2020 2021   BILIRUBINUR NEGATIVE 09/25/2020 2021   KETONESUR NEGATIVE 09/25/2020 2021   PROTEINUR 100 (A) 09/25/2020 2021   NITRITE NEGATIVE 09/25/2020 2021   LEUKOCYTESUR NEGATIVE 09/25/2020 2021   Sepsis Labs: @LABRCNTIP (procalcitonin:4,lacticidven:4) ) Recent Results (from the past 240 hour(s))  Urine Culture     Status: Abnormal   Collection Time: 09/25/20 10:01 PM   Specimen: Urine, Clean Catch  Result Value Ref Range Status   Specimen Description URINE, CLEAN CATCH  Final   Special Requests   Final    NONE Performed at [facility], [place] [address], [place], [place] [address]    Culture MULTIPLE SPECIES PRESENT, SUGGEST RECOLLECTION (A)  Final   Report Status 09/27/2020 FINAL   Final  Resp Panel by RT-PCR (Flu A&B, Covid) Nasopharyngeal Swab     Status: None   Collection Time: 09/25/20 10:09 PM   Specimen: Nasopharyngeal Swab; Nasopharyngeal(NP) swabs in vial transport medium  Result Value Ref Range Status   SARS Coronavirus 2 by RT PCR NEGATIVE NEGATIVE Final    Comment: (NOTE) SARS-CoV-2 target nucleic acids are NOT DETECTED.  The SARS-CoV-2 RNA is generally detectable in upper respiratory specimens during the acute phase of infection. The lowest concentration of SARS-CoV-2 viral copies this assay can detect is 138 copies/mL. A negative result does not preclude SARS-Cov-2 infection and should not be used as the sole basis for treatment or other patient management decisions. A negative result may occur with  improper specimen collection/handling, submission of specimen other than nasopharyngeal swab, presence of viral mutation(s) within the areas targeted by this assay, and inadequate number of viral copies(<138 copies/mL). A negative result must be combined with clinical observations, patient history, and epidemiological information. The expected result is Negative.  Fact Sheet for Patients:  EntrepreneurPulse.com.au  Fact Sheet for Healthcare Providers:  IncredibleEmployment.be  This test is no t yet approved or cleared by the [place] FDA and  has been authorized for detection and/or diagnosis of SARS-CoV-2 by FDA under an Emergency Use Authorization (EUA). This EUA will remain  in effect (meaning this test can be used) for the duration of the COVID-19 declaration under Section 564(b)(1) of the Act, 21 U.S.C.section 360bbb-3(b)(1), unless the authorization is terminated  or revoked sooner.       Influenza A by PCR NEGATIVE NEGATIVE Final   Influenza B by PCR NEGATIVE NEGATIVE Final    Comment: (NOTE) The Xpert Xpress SARS-CoV-2/FLU/RSV plus assay is intended as an aid in the diagnosis of influenza from  Nasopharyngeal swab specimens and should not be used as a sole basis for treatment. Nasal washings and aspirates are unacceptable for  Xpert Xpress SARS-CoV-2/FLU/RSV testing.  Fact Sheet for Patients: EntrepreneurPulse.com.au  Fact Sheet for Healthcare Providers: IncredibleEmployment.be  This test is not yet approved or cleared by the [place] FDA and has been authorized for detection and/or diagnosis of SARS-CoV-2 by FDA under an Emergency Use Authorization (EUA). This EUA will remain in effect (meaning this test can be used) for the duration of the COVID-19 declaration under Section 564(b)(1) of the Act, 21 U.S.C. section 360bbb-3(b)(1), unless the authorization is terminated or revoked.  Performed at [facility], [place] [address] [place], [place] [address]   Culture, blood (routine x 2)     Status: None   Collection Time: 09/26/20  9:40 PM   Specimen: BLOOD  Result Value Ref Range Status   Specimen Description BLOOD SITE NOT SPECIFIED  Final   Special Requests   Final    BOTTLES DRAWN AEROBIC AND ANAEROBIC Blood Culture adequate volume   Culture   Final    NO GROWTH 5 DAYS Performed at [facility], [address], [place], [place] [address]    Report Status 10/01/2020 FINAL  Final  Culture, blood (routine x 2)     Status: None   Collection Time: 09/26/20  9:46 PM   Specimen: BLOOD  Result Value Ref Range Status   Specimen Description BLOOD LEFT ANTECUBITAL  Final   Special Requests   Final    BOTTLES DRAWN AEROBIC AND ANAEROBIC Blood Culture adequate volume   Culture   Final    NO GROWTH 5 DAYS Performed at [facility], [place] [address], [place], [place] [address]    Report Status 10/01/2020 FINAL  Final  Body fluid culture w Gram Stain     Status: None   Collection Time: 09/27/20 11:30 AM   Specimen: Peritoneal Washings; Peritoneal Fluid  Result Value Ref Range Status   Specimen Description PERITONEAL  FLUID  Final   Special Requests NONE  Final   Gram Stain   Final    ABUNDANT WBC PRESENT, PREDOMINANTLY PMN NO ORGANISMS SEEN    Culture   Final    MODERATE STREPTOCOCCUS GROUP F CRITICAL RESULT CALLED TO, READ BACK BY AND VERIFIED WITH: RN [email or web address].BLUE AT [id number] ON 10/01/2020 BY T.SAAD. Performed at [facility], [place] [address], [place], [place] [address]    Report Status 10/01/2020 FINAL  Final   Organism ID, Bacteria STREPTOCOCCUS GROUP F  Final      Susceptibility   Streptococcus group f - MIC*    PENICILLIN INTERMEDIATE Intermediate     CEFTRIAXONE 1 SENSITIVE Sensitive     ERYTHROMYCIN <=0.12 SENSITIVE Sensitive     LEVOFLOXACIN 0.5 SENSITIVE Sensitive     VANCOMYCIN 0.5 SENSITIVE Sensitive     * MODERATE STREPTOCOCCUS GROUP F  Aerobic/Anaerobic Culture w Gram Stain (surgical/deep wound)     Status: None (Preliminary result)   Collection Time: 10/01/20  9:30 AM   Specimen: Tissue  Result Value Ref Range Status   Specimen Description TISSUE  Final   Special Requests LEFT LIVER MASS  Final   Gram Stain NO WBC SEEN NO ORGANISMS SEEN   Final   Culture   Final    RARE STREPTOCOCCUS GROUP F Beta hemolytic streptococci are predictably susceptible to penicillin and other beta lactams. Susceptibility testing not routinely performed. CRITICAL RESULT CALLED TO, READ BACK BY AND VERIFIED WITH: RN G.REN AT [id number] ON 10/03/2020 BY T.SAAD Performed at [facility], [place],  [place] [address]    Report Status PENDING  Incomplete  Aerobic/Anaerobic Culture w Gram Stain (surgical/deep wound)     Status: None (Preliminary result)   Collection Time: 10/01/20  9:47 AM   Specimen: Abscess  Result Value Ref Range Status   Specimen Description ABSCESS  Final   Special Requests LEFT LIVER  Final   Gram Stain   Final    NO WBC SEEN NO ORGANISMS SEEN Performed at [facility], [address] [place], [place] [address]    Culture   Final    RARE STREPTOCOCCUS GROUP  F Beta hemolytic streptococci are predictably susceptible to penicillin and other beta lactams. Susceptibility testing not routinely performed. NO ANAEROBES ISOLATED; CULTURE IN PROGRESS FOR 5 DAYS    Report Status PENDING  Incomplete  Culture, blood (routine x 2)     Status: None (Preliminary result)   Collection Time: 10/02/20  5:43 AM   Specimen: BLOOD RIGHT HAND  Result Value Ref Range Status   Specimen Description BLOOD RIGHT HAND  Final   Special Requests   Final    BOTTLES DRAWN AEROBIC ONLY Blood Culture adequate volume   Culture   Final    NO GROWTH 1 DAY Performed at [facility], [place] [address] [place], [place] [address]    Report Status PENDING  Incomplete  Culture, blood (routine x 2)     Status: None (Preliminary result)   Collection Time: 10/02/20  5:48 AM   Specimen: BLOOD  Result Value Ref Range Status   Specimen Description BLOOD LEFT ANTECUBITAL  Final   Special Requests   Final    BOTTLES DRAWN AEROBIC ONLY Blood Culture adequate volume   Culture   Final    NO GROWTH 1 DAY Performed at [facility], [place] [address], [place], [place] [address]    Report Status PENDING  Incomplete         Radiology Studies: No results found.    Scheduled Meds:  feeding supplement  237 mL Oral TID BM   fentaNYL       mesalamine  1.2 g Oral BID   midazolam       multivitamin with minerals  1 tablet Oral Daily   pantoprazole  40 mg Oral Daily   Continuous Infusions:  sodium chloride Stopped (10/03/20 0451)   cefTRIAXone (ROCEPHIN)  IV Stopped (10/02/20 1703)   metronidazole 500 mg (10/03/20 0837)     LOS: 7 days      Debbe Odea, MD [facility] Pager: [email or web address] 10/03/2020, 4:55 PM

## 2020-10-04 ENCOUNTER — Inpatient Hospital Stay: Payer: Medicaid Other | Admitting: Hematology and Oncology

## 2020-10-04 ENCOUNTER — Inpatient Hospital Stay: Payer: Medicaid Other

## 2020-10-04 ENCOUNTER — Telehealth: Payer: Self-pay | Admitting: Hematology and Oncology

## 2020-10-04 DIAGNOSIS — K6389 Other specified diseases of intestine: Secondary | ICD-10-CM | POA: Diagnosis not present

## 2020-10-04 DIAGNOSIS — K639 Disease of intestine, unspecified: Secondary | ICD-10-CM

## 2020-10-04 DIAGNOSIS — K529 Noninfective gastroenteritis and colitis, unspecified: Secondary | ICD-10-CM | POA: Diagnosis not present

## 2020-10-04 DIAGNOSIS — K750 Abscess of liver: Secondary | ICD-10-CM | POA: Diagnosis not present

## 2020-10-04 NOTE — Plan of Care (Signed)
  Problem: Education: Goal: Knowledge of General Education information will improve Description Including pain rating scale, medication(s)/side effects and non-pharmacologic comfort measures Outcome: Progressing   Problem: Health Behavior/Discharge Planning: Goal: Ability to manage health-related needs will improve Outcome: Progressing   

## 2020-10-04 NOTE — Progress Notes (Addendum)
Patient ID: Nicole Browning, female   DOB: 1971-01-20, 50 y.o.   MRN: [id number]    Referring Physician(s): Manandhar,S  Supervising Physician: Mir, Sharen Heck  Patient Status:    Chief Complaint:  Abdominal pain/ subhepatic fluid collection  Subjective: Pt states that she feels better since abd drain placed yesterday; she has some soreness at drain insertion site; denies N/V; asking about going home   Allergies: Bactrim [sulfamethoxazole-trimethoprim], Percocet [oxycodone-acetaminophen], and Valium [diazepam]  Medications: Prior to Admission medications   Medication Sig Start Date End Date Taking? Authorizing Provider  aspirin EC 325 MG tablet Take 325 mg by mouth every 6 (six) hours as needed for moderate pain.   Yes [provider]  bismuth subsalicylate (PEPTO BISMOL) 262 MG/15ML suspension Take 30 mLs by mouth every 6 (six) hours as needed for indigestion.   Yes [provider]  ibuprofen (ADVIL) 200 MG tablet Take 200 mg by mouth every 6 (six) hours as needed for moderate pain.   Yes [provider]  levofloxacin (LEVAQUIN) 750 MG tablet Take 1 tablet (750 mg total) by mouth daily for 21 days. 10/03/20 10/24/20 Yes Debbe Odea, MD  mesalamine (LIALDA) 1.2 g EC tablet Take 1 tablet (1.2 g total) by mouth in the morning and at bedtime. 07/11/20  Yes Noralyn Pick, NP  metroNIDAZOLE (FLAGYL) 500 MG tablet Take 1 tablet (500 mg total) by mouth 2 (two) times daily for 21 days. 10/03/20 10/24/20 Yes Debbe Odea, MD  Multiple Vitamins-Minerals (WOMENS MULTIVITAMIN PO) Take 1 tablet by mouth daily.   Yes [provider]  naproxen sodium (ALEVE) 220 MG tablet Take 220 mg by mouth daily as needed (pain).   Yes [provider]  POTASSIUM PO Take 1 tablet by mouth daily.   Yes [provider]  Vitamin D, Ergocalciferol, (DRISDOL) 1.25 MG (50000 UNIT) CAPS capsule Take 1 capsule (50,000 Units total) by mouth every 7 (seven) days. TAKE  ONE CAPSULE BY MOUTH ONCE WEEKLY 07/22/20  Yes Noralyn Pick, NP  VITAMIN E PO Take 1 tablet by mouth daily.   Yes [provider]  doxycycline (VIBRAMYCIN) 100 MG capsule Take 1 capsule (100 mg total) by mouth 2 (two) times daily. Patient not taking: Reported on 09/25/2020 07/09/20   Scot Jun, FNP     Vital Signs: BP 104/74 (BP Location: Right Arm)   Pulse 96   Temp 98.9 F (37.2 C) (Oral)   Resp 16   Ht 5' 4"  (1.626 m)   Wt 130 lb 11.7 oz (59.3 kg)   LMP  (LMP Unknown)   SpO2 98%   BMI 22.44 kg/m   Physical Exam awake/alert; mid abd drain intact, insertion site ok, mildly tender, OP yesterday 175 cc, today 55 cc turbid beige colored fluid; drain flushed without difficulty  Imaging: CT ABDOMEN PELVIS W CONTRAST  Result Date: 09/30/2020 CLINICAL DATA:  Chronic abdominal pain. Prior CT suspicious for sigmoid neoplasm. History of Crohn disease. EXAM: CT ABDOMEN AND PELVIS WITH CONTRAST TECHNIQUE: Multidetector CT imaging of the abdomen and pelvis was performed using the standard protocol following bolus administration of intravenous contrast. CONTRAST:  143m OMNIPAQUE IOHEXOL 300 MG/ML  SOLN COMPARISON:  Abdominopelvic CT of 09/25/2020. Pelvic abdominopelvic MRI 09/26/2020. FINDINGS: Lower chest: Worsened bibasilar aeration with development of right greater than left base airspace disease. Normal heart size with new tiny bilateral pleural effusions. Hepatobiliary: Capsular based masses within the liver may be parenchymal or peritoneal. Example within the caudate lobe at 6.3 x 6.1  cm on 22/3 and the posterior aspect of segment 2-3 at 6.0 x 4.3 cm on 27/3. Gallbladder wall thickening is nonspecific. No calcified stone or biliary duct dilatation. Pancreas: Normal, without mass or ductal dilatation. Spleen: Normal in size, without focal abnormality. Adrenals/Urinary Tract: Normal adrenal glands. Left renal sinus cyst an interpolar too small to characterize lesion. Normal  right kidney. Normal urinary bladder. Stomach/Bowel: Portions of the stomach are decompressed and therefore likely secondarily appear thick walled.The sigmoid is moderately thick walled, including on 71/3. Normal terminal ileum and appendix. Normal small bowel caliber. No complicating obstruction. Vascular/Lymphatic: Aortic atherosclerosis. No abdominopelvic adenopathy. Porta hepatis nodes of up to 10 mm are not pathologic by size criteria. Reproductive: Normal uterus and adnexa. Other: Development of small volume ascites with loculated fluid collections since 09/25/2020. Example collection with peritoneal thickening and hyperenhancement within the anterior abdomen at 13.8 x 4.3 cm on 42/3. A complex fluid and gas collection cephalad to the thickened sigmoid measures 3.7 x 2.8 cm on 67/3 and is similar to 09/25/2020. Inferior to this thickened sigmoid is a fluid collection as detailed on recent MRI with minimal gas within. Example 3.1 x 2.4 cm on 74/3. No free intraperitoneal air. Presumably iatrogenic nodularity about the anterior abdominal wall. Musculoskeletal: Right sacroiliac joint sclerosis suggest sacroiliitis. IMPRESSION: 1. Findings which are again suspicious for metastatic disease to the peritoneum and possibly hepatic parenchyma. Dominant capsular based liver masses have been detailed previously. A new loculated fluid collection within the anterior abdomen with peritoneal thickening/enhancement. Differential considerations include infected ascites/developing abscess or peritoneal carcinomatosis. Correlate with paracentesis results of 1 day prior. 2. Sigmoid wall thickening which could represent the site of primary neoplasm. Pericolonic complex fluid collections could represent developing abscesses, and were detailed on the prior pelvic MRI. 3. No bowel obstruction or other acute complication. 4. New small bilateral pleural effusions with adjacent atelectasis or infection. 5. Apparent gastric wall  thickening, at least partially felt to be due to underdistention. Electronically Signed   By: Abigail Miyamoto M.D.   On: 09/30/2020 14:22   US BIOPSY (LIVER)  Result Date: 10/01/2020 INDICATION: 50 year old female with a history of liver masses, known inflammatory bowel disease, admitted for sepsis. EXAM: ULTRASOUND-GUIDED LIVER MASS BIOPSY ULTRASOUND-GUIDED LIVER MASS CULTURE/ASPIRATION MEDICATIONS: None. ANESTHESIA/SEDATION: Moderate (conscious) sedation was employed during this procedure. A total of Versed 2.0 mg and Fentanyl 75 mcg was administered intravenously. Moderate Sedation Time: 14 minutes. The patient's level of consciousness and vital signs were monitored continuously by radiology nursing throughout the procedure under my direct supervision. FLUOROSCOPY TIME:  Ultrasound COMPLICATIONS: None PROCEDURE: Informed written consent was obtained from the patient after a thorough discussion of the procedural risks, benefits and alternatives. All questions were addressed. Maximal Sterile Barrier Technique was utilized including caps, mask, sterile gowns, sterile gloves, sterile drape, hand hygiene and skin antiseptic. A timeout was performed prior to the initiation of the procedure. Ultrasound survey of the right liver lobe performed with images stored and sent to PACs. The subxiphoid region was prepped with chlorhexidine in a sterile fashion, and a sterile drape was applied covering the operative field. A sterile gown and sterile gloves were used for the procedure. Local anesthesia was provided with 1% Lidocaine. The patient was prepped and draped sterilely and the skin and subcutaneous tissues were generously infiltrated with 1% lidocaine. A 17 gauge introducer needle was then advanced under ultrasound guidance in subxiphoid location into the left liver, targeting the heterogeneous mass in the posterior left liver. The stylet  was removed, and multiple separate 18 gauge core biopsy were retrieved. Samples were  placed into both formalin solution, as well as sterile saline solution. The needle was then aspirated upon withdrawal for a liquid culture to be sent to the lab. Final ultrasound image was performed. The patient tolerated the procedure well and remained hemodynamically stable throughout. No complications were encountered and no significant blood loss was encounter. FINDINGS: Ultrasound demonstrates solid appearance of both right liver mass and left liver mass, with no ultrasound correlate of what appears to be some septations and fluid on the prior MR and CT imaging. IMPRESSION: Status post ultrasound-guided left liver mass biopsy and liver mass culture/aspiration, as diagnostic test for possible metastatic lesion versus infection Signed, Dulcy Fanny. Dellia Nims, RPVI [facility] Radiology Electronically Signed   By: Corrie Mckusick D.O.   On: 10/01/2020 10:08   ECHOCARDIOGRAM COMPLETE  Result Date: 10/01/2020    ECHOCARDIOGRAM REPORT   Patient Name:   SHAWNIQUE MARIOTTI Date of Exam: 10/01/2020 Medical Rec #:  [id number]      Height:       64.0 in Accession #:    [id number]     Weight:       125.0 lb Date of Birth:  [birth year]      BSA:          1.602 m Patient Age:    31 years       BP:           126/83 mmHg Patient Gender: F              HR:           96 bpm. Exam Location:  Inpatient Procedure: 2D Echo, Cardiac Doppler and Color Doppler Indications:    Bacteremia  History:        Patient has no prior history of Echocardiogram examinations.                 Arrythmias:Tachycardia. ARF, Anemia, Liver mass.  Sonographer:    Dustin Flock Referring Phys: [id number] [place]  1. Left ventricular ejection fraction, by estimation, is 60 to 65%. The left ventricle has normal function. The left ventricle has no regional wall motion abnormalities. Left ventricular diastolic parameters were normal.  2. Right ventricular systolic function is normal. The right  ventricular size is normal. There is mildly elevated pulmonary artery systolic pressure. The estimated right ventricular systolic pressure is 83.3 mmHg.  3. The mitral valve is normal in structure. No evidence of mitral valve regurgitation.  4. The aortic valve is tricuspid. Aortic valve regurgitation is not visualized. No aortic stenosis is present. FINDINGS  Left Ventricle: Left ventricular ejection fraction, by estimation, is 60 to 65%. The left ventricle has normal function. The left ventricle has no regional wall motion abnormalities. The left ventricular internal cavity size was normal in size. There is  no left ventricular hypertrophy. Left ventricular diastolic parameters were normal. Right Ventricle: The right ventricular size is normal. No increase in right ventricular wall thickness. Right ventricular systolic function is normal. There is mildly elevated pulmonary artery systolic pressure. The tricuspid regurgitant velocity is 2.89  m/s, and with an assumed right atrial pressure of 3 mmHg, the estimated right ventricular systolic pressure is 82.5 mmHg. Left Atrium: Left atrial size was normal in size. Right Atrium: Right atrial size was normal in size. Pericardium: There is no evidence of pericardial effusion. Mitral Valve: The mitral valve is normal in structure.  No evidence of mitral valve regurgitation. Tricuspid Valve: The tricuspid valve is normal in structure. Tricuspid valve regurgitation is trivial. Aortic Valve: The aortic valve is tricuspid. Aortic valve regurgitation is not visualized. No aortic stenosis is present. Pulmonic Valve: The pulmonic valve was not well visualized. Pulmonic valve regurgitation is not visualized. Aorta: The aortic root is normal in size and structure. IAS/Shunts: The interatrial septum was not well visualized.  LEFT VENTRICLE PLAX 2D LVIDd:         4.80 cm  Diastology LVIDs:         2.90 cm  LV e' medial:    7.18 cm/s LV PW:         0.80 cm  LV E/e' medial:  15.5 LV  IVS:        0.80 cm  LV e' lateral:   13.60 cm/s LVOT diam:     1.90 cm  LV E/e' lateral: 8.2 LV SV:         54 LV SV Index:   34 LVOT Area:     2.84 cm  RIGHT VENTRICLE RV Basal diam:  2.60 cm RV S prime:     13.30 cm/s TAPSE (M-mode): 1.6 cm LEFT ATRIUM             Index       RIGHT ATRIUM           Index LA diam:        3.00 cm 1.87 cm/m  RA Area:     11.10 cm LA Vol (A2C):   37.9 ml 23.66 ml/m RA Volume:   22.20 ml  13.86 ml/m LA Vol (A4C):   32.7 ml 20.41 ml/m LA Biplane Vol: 35.5 ml 22.16 ml/m  AORTIC VALVE LVOT Vmax:   105.00 cm/s LVOT Vmean:  69.900 cm/s LVOT VTI:    0.190 m  AORTA Ao Root diam: 2.20 cm MITRAL VALVE                TRICUSPID VALVE MV Area (PHT): 7.29 cm     TR Peak grad:   33.4 mmHg MV Decel Time: 104 msec     TR Vmax:        289.00 cm/s MV E velocity: 111.00 cm/s MV A velocity: 91.20 cm/s   SHUNTS MV E/A ratio:  1.22         Systemic VTI:  0.19 m                             Systemic Diam: 1.90 cm Oswaldo Milian MD Electronically signed by Oswaldo Milian MD Signature Date/Time: 10/01/2020/5:11:46 PM    Final    CT IMAGE GUIDED DRAINAGE BY PERCUTANEOUS CATHETER  Result Date: 10/04/2020 INDICATION: 50 year old female with abdominal pain and sepsis found to have subhepatic fluid collection on recent CT. EXAM: CT IMAGE GUIDED DRAINAGE BY PERCUTANEOUS CATHETER COMPARISON:  09/30/2020 MEDICATIONS: The patient is currently admitted to the hospital and receiving intravenous antibiotics. The antibiotics were administered within an appropriate time frame prior to the initiation of the procedure. ANESTHESIA/SEDATION: Moderate (conscious) sedation was employed during this procedure. A total of Versed 1.5 mg and Fentanyl 100 mcg was administered intravenously. Moderate Sedation Time: 14 minutes. The patient's level of consciousness and vital signs were monitored continuously by radiology nursing throughout the procedure under my direct supervision. CONTRAST:  None COMPLICATIONS: None  immediate. PROCEDURE: Informed written consent was obtained from the patient after a discussion of the  risks, benefits and alternatives to treatment. The patient was placed supine on the CT gantry and a pre procedural CT was performed re-demonstrating the known subhepatic abscess/fluid collection. The procedure was planned. A timeout was performed prior to the initiation of the procedure. The midline upper abdominal region was prepped and draped in the usual sterile fashion. The overlying soft tissues were anesthetized with 1% lidocaine with epinephrine. Appropriate trajectory was planned with the use of a 22 gauge spinal needle. An 18 gauge trocar needle was advanced into the abscess/fluid collection and a short Amplatz super stiff wire was coiled within the collection. Appropriate positioning was confirmed with a limited CT scan. The tract was serially dilated allowing placement of a 10.2 [place] all-purpose drainage catheter. Appropriate positioning was confirmed with a limited postprocedural CT scan. Approximately 50 ml of purulent fluid was aspirated. The tube was connected to a drainage bag and sutured in place. A dressing was placed. The patient tolerated the procedure well without immediate post procedural complication. IMPRESSION: Successful CT guided placement of a 10.2 [place] all purpose drain catheter into the subhepatic fluid collection with aspiration of approximately 50 mL of purulent fluid. Samples were sent to the laboratory as requested by the ordering clinical team. Ruthann Cancer, MD [facility] Radiology Electronically Signed   By: Ruthann Cancer MD   On: 10/04/2020 07:58    Labs:  CBC: Recent Labs    09/30/20 0239 10/01/20 0008 10/02/20 0543 10/03/20 0342  WBC 20.0* 18.2* 15.5* 11.6*  HGB 8.1* 7.8* 7.3* 7.6*  HCT 24.2* 24.1* 22.4* 23.1*  PLT 397 497* 450* 485*    COAGS: Recent Labs    09/27/20 0725 10/01/20 0008  INR 1.2 1.2     BMP: Recent Labs    09/30/20 0239 10/01/20 0008 10/02/20 0543 10/03/20 0342  NA 134* 134* 133* 134*  K 4.5 4.3 4.3 4.4  CL 101 101 100 99  CO2 27 27 24 26   GLUCOSE 151* 130* 97 99  BUN 6 <5* <5* <5*  CALCIUM 8.8* 8.7* 8.6* 8.7*  CREATININE 0.76 0.69 0.66 0.75  GFRNONAA >60 >60 >60 >60    LIVER FUNCTION TESTS: Recent Labs    09/30/20 0239 10/01/20 0008 10/02/20 0543 10/03/20 0342  BILITOT 0.5 0.3 0.9 0.5  AST 28 26 34 34  ALT 63* 56* 54* 53*  ALKPHOS 75 76 58 63  PROT 5.9* 6.3* 6.3* 6.4*  ALBUMIN 1.6* 1.7* 1.7* 1.8*    Assessment and Plan: Pt with hx IBD, strept peritonitis/bacteremia; s/p subhepatic fluid collection drain 8/3; afebrile; drain fl cx neg to date; no new labs today; OP yesterday 175 cc, today 55 cc turbid beige fluid; cont with drain irrigation/output monitoring, lab checks; once OP minimal or if WBC increases obtain f/u CT   Electronically Signed: D. Rowe Robert, PA-C 10/04/2020, 11:21 AM   I spent a total of 15 Minutes at the the patient's bedside AND on the patient's hospital floor or unit, greater than 50% of which was counseling/coordinating care for subhepatic fluid collection drain

## 2020-10-04 NOTE — Progress Notes (Signed)
PROGRESS NOTE    Nicole Browning   PIR:[id number]  DOB: [birth year]  DOA: 09/25/2020 PCP: Kerin Perna, NP   Brief Narrative:  Nicole Browning is a 50 year old female with recent diagnosis of IBD in April 2022 presented with abdominal pain, nausea and vomiting.  On presentation, she was slightly hypotensive which improved with IV fluids.  Creatinine was elevated at 2.54 along with AST of 133, ALT of 231 and total bilirubin of 0.8.  CT of the abdomen and pelvis showed features concerning for sigmoid mass with possible hepatic metastasis.  She was given a dose of IV Rocephin in the ED for possible UTI.  GI was consulted.  Patient underwent ultrasound-guided paracentesis and antibiotics were started for possible infected ascites versus liver abscess.  General surgery was consulted by GI.  She had EGD on 09/29/2020 which showed erosive gastritis.  She underwent liver biopsy on 10/01/2020 by IR.   Subjective: After the drain was placed yesterday, the bag filled up quickly. She has no complaints today.  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 - 8/4 s/p IR drainage of subhepatic fluid collection and pigtail cath  - no organisms seen - 177 cc on day one- 55 cc today  Active Problems:   AKI (acute kidney injury) ([place]) - Cr 2.54- has improved to normal with IVF  Erosive esophagitis - noted on EGD -cont PPI    IDA (iron deficiency anemia) - follows with oncology and was prescribed oral iron in May, 22    IBD (inflammatory bowel disease) - cont Mesalamine    Protein-calorie malnutrition, severe  Interventions: MVI, Ensure Enlive (each supplement provides 350kcal and 20 grams of protein)    Time  spent in minutes: 35 DVT prophylaxis: SCDs Start: 09/25/20 2329  Code Status: full code Family Communication:  Level of Care: Level of care: Med-Surg Disposition Plan:  Status is: Inpatient  Remains inpatient appropriate because:IV treatments appropriate due to intensity of illness or inability to take PO  Dispo: The patient is from: Home              Anticipated d/c is to: Home              Patient currently is not medically stable to d/c.   Difficult to place patient No      Consultants:  GI IR Gen surg Procedures:  IR aspiration EGD Antimicrobials:  Anti-infectives (From admission, onward)    Start     Dose/Rate Route Frequency Ordered Stop   10/03/20 0000  levofloxacin (LEVAQUIN) 750 MG tablet        750 mg Oral Daily 10/03/20 1147 10/24/20 2359   10/03/20 0000  metroNIDAZOLE (FLAGYL) 500 MG tablet        500 mg Oral 2 times daily 10/03/20 1147 10/24/20 2359   10/02/20 1700  cefTRIAXone (ROCEPHIN) 2 g in sodium chloride 0.9 % 100 mL IVPB        2 g 200 mL/hr over 30 Minutes Intravenous Every 24 hours 10/02/20 1336     10/01/20 1700  levofloxacin (LEVAQUIN) IVPB 750 mg  Status:  Discontinued        750 mg 100 mL/hr over 90 Minutes  Intravenous Every 24 hours 10/01/20 1156 10/02/20 1336   10/01/20 1600  metroNIDAZOLE (FLAGYL) IVPB 500 mg        500 mg 100 mL/hr over 60 Minutes Intravenous Every 8 hours 10/01/20 1156     09/27/20 0145  piperacillin-tazobactam (ZOSYN) IVPB 3.375 g  Status:  Discontinued        3.375 g 12.5 mL/hr over 240 Minutes Intravenous Every 8 hours 09/27/20 0057 10/01/20 1156   09/26/20 2200  cefTRIAXone (ROCEPHIN) 1 g in sodium chloride 0.9 % 100 mL IVPB  Status:  Discontinued        1 g 200 mL/hr over 30 Minutes Intravenous Every 24 hours 09/25/20 2331 09/26/20 0617   09/25/20 2300  cefTRIAXone (ROCEPHIN) 1 g in sodium chloride 0.9 % 100 mL IVPB        1 g 200 mL/hr over 30 Minutes Intravenous  Once 09/25/20 2245 09/25/20 2353         Objective: Vitals:   10/03/20 1712 10/03/20 2138 10/04/20 0525 10/04/20 0926  BP: 121/85 120/73 107/75 104/74  Pulse: (!) 105 (!) 109 98 96  Resp: 18 15 17 16   Temp: 98.1 F (36.7 C) 99.3 F (37.4 C) 99.1 F (37.3 C) 98.9 F (37.2 C)  TempSrc: Oral Oral Oral Oral  SpO2: 100% 98% 96% 98%  Weight:      Height:        Intake/Output Summary (Last 24 hours) at 10/04/2020 1122 Last data filed at 10/04/2020 1029 Gross per 24 hour  Intake 615 ml  Output 232 ml  Net 383 ml    Filed Weights   09/29/20 0912 09/30/20 0500 10/02/20 0606  Weight: 56.7 kg 56.7 kg 59.3 kg    Examination: General exam: Appears comfortable  HEENT: PERRLA, oral mucosa moist, no sclera icterus or thrush Respiratory system: Clear to auscultation. Respiratory effort normal. Cardiovascular system: S1 & S2 heard, regular rate and rhythm Gastrointestinal system: Abdomen soft, non-tender, nondistended. Normal bowel sounds  - drain noted in right upper abdomen Central nervous system: Alert and oriented. No focal neurological deficits. Extremities: No cyanosis, clubbing or edema Skin: No rashes or ulcers Psychiatry:  Mood & affect appropriate.      Data Reviewed: I have personally reviewed following labs and imaging studies  CBC: Recent Labs  Lab 09/28/20 0348 09/29/20 0224 09/30/20 0239 10/01/20 0008 10/02/20 0543 10/03/20 0342  WBC 11.5* 16.7* 20.0* 18.2* 15.5* 11.6*  NEUTROABS 8.1* 12.0* 16.4*  --  13.3* 9.4*  HGB 8.4* 8.2* 8.1* 7.8* 7.3* 7.6*  HCT 25.4* 24.4* 24.2* 24.1* 22.4* 23.1*  MCV 81.7 81.3 81.8 82.8 83.6 83.7  PLT 322 353 397 497* 450* 485*    Basic Metabolic Panel: Recent Labs  Lab 09/29/20 0224 09/30/20 0239 10/01/20 0008 10/02/20 0543 10/03/20 0342  NA 136 134* 134* 133* 134*  K 4.1 4.5 4.3 4.3 4.4  CL 103 101 101 100 99  CO2 26 27 27 24 26   GLUCOSE 110* 151* 130* 97 99  BUN 5* 6 <5* <5* <5*  CREATININE 0.66 0.76 0.69 0.66 0.75  CALCIUM 8.7* 8.8* 8.7* 8.6* 8.7*  MG  1.7 1.7 1.9 1.7 1.9    GFR: Estimated Creatinine Clearance: 72.6 mL/min (by C-G formula based on SCr of 0.75 mg/dL). Liver Function Tests: Recent Labs  Lab 09/29/20 0224 09/30/20 0239 10/01/20 0008 10/02/20 0543 10/03/20 0342  AST 35 28 26 34 34  ALT 77* 63* 56* 54* 53*  ALKPHOS 71 75 76 58 63  BILITOT  0.9 0.5 0.3 0.9 0.5  PROT 5.7* 5.9* 6.3* 6.3* 6.4*  ALBUMIN 1.6* 1.6* 1.7* 1.7* 1.8*    No results for input(s): LIPASE, AMYLASE in the last 168 hours. No results for input(s): AMMONIA in the last 168 hours. Coagulation Profile: Recent Labs  Lab 10/01/20 0008  INR 1.2    Cardiac Enzymes: No results for input(s): CKTOTAL, CKMB, CKMBINDEX, TROPONINI in the last 168 hours. BNP (last 3 results) No results for input(s): PROBNP in the last 8760 hours. HbA1C: No results for input(s): HGBA1C in the last 72 hours. CBG: No results for input(s): GLUCAP in the last 168 hours. Lipid Profile: No results for input(s): CHOL, HDL, LDLCALC, TRIG, CHOLHDL, LDLDIRECT in the last 72 hours. Thyroid Function Tests: No results for input(s): TSH, T4TOTAL, FREET4, T3FREE, THYROIDAB in the last 72 hours. Anemia Panel: No results for input(s): VITAMINB12, FOLATE, FERRITIN, TIBC, IRON, RETICCTPCT in the last 72 hours. Urine analysis:    Component Value Date/Time   COLORURINE AMBER (A) 09/25/2020 2021   APPEARANCEUR CLOUDY (A) 09/25/2020 2021   LABSPEC 1.021 09/25/2020 2021   PHURINE 5.0 09/25/2020 2021   GLUCOSEU NEGATIVE 09/25/2020 2021   HGBUR MODERATE (A) 09/25/2020 2021   BILIRUBINUR NEGATIVE 09/25/2020 2021   KETONESUR NEGATIVE 09/25/2020 2021   PROTEINUR 100 (A) 09/25/2020 2021   NITRITE NEGATIVE 09/25/2020 2021   LEUKOCYTESUR NEGATIVE 09/25/2020 2021   Sepsis Labs: @LABRCNTIP (procalcitonin:4,lacticidven:4) ) Recent Results (from the past 240 hour(s))  Urine Culture     Status: Abnormal   Collection Time: 09/25/20 10:01 PM   Specimen: Urine, Clean Catch  Result Value Ref  Range Status   Specimen Description URINE, CLEAN CATCH  Final   Special Requests   Final    NONE Performed at [facility], [place] [address], [place], [place] [address]    Culture MULTIPLE SPECIES PRESENT, SUGGEST RECOLLECTION (A)  Final   Report Status 09/27/2020 FINAL  Final  Resp Panel by RT-PCR (Flu A&B, Covid) Nasopharyngeal Swab     Status: None   Collection Time: 09/25/20 10:09 PM   Specimen: Nasopharyngeal Swab; Nasopharyngeal(NP) swabs in vial transport medium  Result Value Ref Range Status   SARS Coronavirus 2 by RT PCR NEGATIVE NEGATIVE Final    Comment: (NOTE) SARS-CoV-2 target nucleic acids are NOT DETECTED.  The SARS-CoV-2 RNA is generally detectable in upper respiratory specimens during the acute phase of infection. The lowest concentration of SARS-CoV-2 viral copies this assay can detect is 138 copies/mL. A negative result does not preclude SARS-Cov-2 infection and should not be used as the sole basis for treatment or other patient management decisions. A negative result may occur with  improper specimen collection/handling, submission of specimen other than nasopharyngeal swab, presence of viral mutation(s) within the areas targeted by this assay, and inadequate number of viral copies(<138 copies/mL). A negative result must be combined with clinical observations, patient history, and epidemiological information. The expected result is Negative.  Fact Sheet for Patients:  EntrepreneurPulse.com.au  Fact Sheet for Healthcare Providers:  IncredibleEmployment.be  This test is no t yet approved or cleared by the [place] FDA and  has been authorized for detection and/or diagnosis of SARS-CoV-2 by FDA under an Emergency Use Authorization (EUA). This EUA will remain  in effect (meaning this test can be used) for the duration of the COVID-19 declaration under Section 564(b)(1) of the Act, 21 U.S.C.section 360bbb-3(b)(1),  unless the authorization is terminated  or revoked sooner.       Influenza A  by PCR NEGATIVE NEGATIVE Final   Influenza B by PCR NEGATIVE NEGATIVE Final    Comment: (NOTE) The Xpert Xpress SARS-CoV-2/FLU/RSV plus assay is intended as an aid in the diagnosis of influenza from Nasopharyngeal swab specimens and should not be used as a sole basis for treatment. Nasal washings and aspirates are unacceptable for Xpert Xpress SARS-CoV-2/FLU/RSV testing.  Fact Sheet for Patients: EntrepreneurPulse.com.au  Fact Sheet for Healthcare Providers: IncredibleEmployment.be  This test is not yet approved or cleared by the [place] FDA and has been authorized for detection and/or diagnosis of SARS-CoV-2 by FDA under an Emergency Use Authorization (EUA). This EUA will remain in effect (meaning this test can be used) for the duration of the COVID-19 declaration under Section 564(b)(1) of the Act, 21 U.S.C. section 360bbb-3(b)(1), unless the authorization is terminated or revoked.  Performed at [facility], [place] [address] [place], [place] [address]   Culture, blood (routine x 2)     Status: None   Collection Time: 09/26/20  9:40 PM   Specimen: BLOOD  Result Value Ref Range Status   Specimen Description BLOOD SITE NOT SPECIFIED  Final   Special Requests   Final    BOTTLES DRAWN AEROBIC AND ANAEROBIC Blood Culture adequate volume   Culture   Final    NO GROWTH 5 DAYS Performed at [facility], [address], [place], [place] [address]    Report Status 10/01/2020 FINAL  Final  Culture, blood (routine x 2)     Status: None   Collection Time: 09/26/20  9:46 PM   Specimen: BLOOD  Result Value Ref Range Status   Specimen Description BLOOD LEFT ANTECUBITAL  Final   Special Requests   Final    BOTTLES DRAWN AEROBIC AND ANAEROBIC Blood Culture adequate volume   Culture   Final    NO GROWTH 5 DAYS Performed at [facility], [place]  [address] [place], [place] [address]    Report Status 10/01/2020 FINAL  Final  Body fluid culture w Gram Stain     Status: None   Collection Time: 09/27/20 11:30 AM   Specimen: Peritoneal Washings; Peritoneal Fluid  Result Value Ref Range Status   Specimen Description PERITONEAL FLUID  Final   Special Requests NONE  Final   Gram Stain   Final    ABUNDANT WBC PRESENT, PREDOMINANTLY PMN NO ORGANISMS SEEN    Culture   Final    MODERATE STREPTOCOCCUS GROUP F CRITICAL RESULT CALLED TO, READ BACK BY AND VERIFIED WITH: RN E.BLUE AT [id number] ON 10/01/2020 BY T.SAAD. Performed at [facility], [place] [address] [place], [place] [address]    Report Status 10/01/2020 FINAL  Final   Organism ID, Bacteria STREPTOCOCCUS GROUP F  Final      Susceptibility   Streptococcus group f - MIC*    PENICILLIN INTERMEDIATE Intermediate     CEFTRIAXONE 1 SENSITIVE Sensitive     ERYTHROMYCIN <=0.12 SENSITIVE Sensitive     LEVOFLOXACIN 0.5 SENSITIVE Sensitive     VANCOMYCIN 0.5 SENSITIVE Sensitive     * MODERATE STREPTOCOCCUS GROUP F  Aerobic/Anaerobic Culture w Gram Stain (surgical/deep wound)     Status: None (Preliminary result)   Collection Time: 10/01/20  9:30 AM   Specimen: Tissue  Result Value Ref Range Status   Specimen Description TISSUE  Final   Special Requests LEFT LIVER MASS  Final   Gram Stain   Final    NO WBC SEEN NO ORGANISMS  SEEN Performed at [facility], [place] [address], [place], [place] [address]    Culture   Final    RARE STREPTOCOCCUS GROUP F Beta hemolytic streptococci are predictably susceptible to penicillin and other beta lactams. Susceptibility testing not routinely performed. CRITICAL RESULT CALLED TO, READ BACK BY AND VERIFIED WITH: RN G.REN AT 7026 ON 10/03/2020 BY T.SAAD NO ANAEROBES ISOLATED; CULTURE IN PROGRESS FOR 5 DAYS    Report Status PENDING  Incomplete  Aerobic/Anaerobic Culture w Gram Stain (surgical/deep wound)     Status: None (Preliminary result)    Collection Time: 10/01/20  9:47 AM   Specimen: Abscess  Result Value Ref Range Status   Specimen Description ABSCESS  Final   Special Requests LEFT LIVER  Final   Gram Stain   Final    NO WBC SEEN NO ORGANISMS SEEN Performed at [facility], [address], [place], [place] [address]    Culture   Final    RARE STREPTOCOCCUS GROUP F Beta hemolytic streptococci are predictably susceptible to penicillin and other beta lactams. Susceptibility testing not routinely performed. NO ANAEROBES ISOLATED; CULTURE IN PROGRESS FOR 5 DAYS    Report Status PENDING  Incomplete  Culture, blood (routine x 2)     Status: None (Preliminary result)   Collection Time: 10/02/20  5:43 AM   Specimen: BLOOD RIGHT HAND  Result Value Ref Range Status   Specimen Description BLOOD RIGHT HAND  Final   Special Requests   Final    BOTTLES DRAWN AEROBIC ONLY Blood Culture adequate volume   Culture   Final    NO GROWTH 2 DAYS Performed at [facility], [address], [place], [place] [address]    Report Status PENDING  Incomplete  Culture, blood (routine x 2)     Status: None (Preliminary result)   Collection Time: 10/02/20  5:48 AM   Specimen: BLOOD  Result Value Ref Range Status   Specimen Description BLOOD LEFT ANTECUBITAL  Final   Special Requests   Final    BOTTLES DRAWN AEROBIC ONLY Blood Culture adequate volume   Culture   Final    NO GROWTH 2 DAYS Performed at [facility], [place] [address] [place], [place] [address]    Report Status PENDING  Incomplete  Aerobic/Anaerobic Culture w Gram Stain (surgical/deep wound)     Status: None (Preliminary result)   Collection Time: 10/03/20  4:36 PM   Specimen: Abscess  Result Value Ref Range Status   Specimen Description ABSCESS  Final   Special Requests ABDOMEN  Final   Gram Stain   Final    ABUNDANT WBC PRESENT, PREDOMINANTLY PMN NO ORGANISMS SEEN    Culture   Final    NO GROWTH < 24 HOURS Performed at [facility], [place] [address] [place], [place] [address]    Report Status PENDING  Incomplete         Radiology Studies: CT IMAGE GUIDED DRAINAGE BY PERCUTANEOUS CATHETER  Result Date: 10/04/2020 INDICATION: 50 year old female with abdominal pain and sepsis found to have subhepatic fluid collection on recent CT. EXAM: CT IMAGE GUIDED DRAINAGE BY PERCUTANEOUS CATHETER COMPARISON:  09/30/2020 MEDICATIONS: The patient is currently admitted to the hospital and receiving intravenous antibiotics. The antibiotics were administered within an appropriate time frame prior to the initiation of the procedure. ANESTHESIA/SEDATION: Moderate (conscious) sedation was employed during this procedure. A total of Versed 1.5 mg and Fentanyl 100 mcg was administered intravenously. Moderate Sedation  Time: 14 minutes. The patient's level of consciousness and vital signs were monitored continuously by radiology nursing throughout the procedure under my direct supervision. CONTRAST:  None COMPLICATIONS: None immediate. PROCEDURE: Informed written consent was obtained from the patient after a discussion of the risks, benefits and alternatives to treatment. The patient was placed supine on the CT gantry and a pre procedural CT was performed re-demonstrating the known subhepatic abscess/fluid collection. The procedure was planned. A timeout was performed prior to the initiation of the procedure. The midline upper abdominal region was prepped and draped in the usual sterile fashion. The overlying soft tissues were anesthetized with 1% lidocaine with epinephrine. Appropriate trajectory was planned with the use of a 22 gauge spinal needle. An 18 gauge trocar needle was advanced into the abscess/fluid collection and a short Amplatz super stiff wire was coiled within the collection. Appropriate positioning was confirmed with a limited CT scan. The tract was serially dilated allowing placement of a 10.2 [place] all-purpose drainage catheter. Appropriate  positioning was confirmed with a limited postprocedural CT scan. Approximately 50 ml of purulent fluid was aspirated. The tube was connected to a drainage bag and sutured in place. A dressing was placed. The patient tolerated the procedure well without immediate post procedural complication. IMPRESSION: Successful CT guided placement of a 10.2 [place] all purpose drain catheter into the subhepatic fluid collection with aspiration of approximately 50 mL of purulent fluid. Samples were sent to the laboratory as requested by the ordering clinical team. Ruthann Cancer, MD [facility] Electronically Signed   By: Ruthann Cancer MD   On: 10/04/2020 07:58      Scheduled Meds:  feeding supplement  237 mL Oral TID BM   mesalamine  1.2 g Oral BID   multivitamin with minerals  1 tablet Oral Daily   pantoprazole  40 mg Oral Daily   sodium chloride flush  5 mL Intracatheter Q8H   Continuous Infusions:  sodium chloride Stopped (10/03/20 0451)   cefTRIAXone (ROCEPHIN)  IV 2 g (10/03/20 1814)   metronidazole 500 mg (10/04/20 0110)     LOS: 8 days      Debbe Odea, MD [facility] Pager: [email or web address] 10/04/2020, 11:22 AM

## 2020-10-04 NOTE — Telephone Encounter (Signed)
R/s missed appt today per 8/4 sch msg. Called, no answer. Will receive printout at discharge

## 2020-10-04 NOTE — Progress Notes (Signed)
Physical Therapy Treatment Patient Details Name: Nicole Browning MRN: [id number] DOB: 1971/01/05 Today's Date: 10/04/2020    History of Present Illness 50 year old with colitis (confirmed by colonoscopy in April 2022 - indeterminate but with hx of perirectal abscesses Crohn's is favored) admitted with abdominal pain, nausea and vomiting, acute kidney injury, abnormal GI imaging including concern for metastatic malignancy versus infectious/abscesses, s/p CT-guided aspiration and drain placement for the intra-abdominal fluid 10/03/20    PT Comments    Pt reports relief in pain with drain placement although she continues to be sore. 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 Extensive education on graded return to PLOF, need for rest and being able to accept help until she is healed. Pt progressing towards goals with much more stability with gait. PT will continue to follow acutely, however pt will not have any additional PT needs at discharge.   Follow Up Recommendations  No PT follow up     Equipment Recommendations  None recommended by PT       Precautions / Restrictions Precautions Precautions: Fall Precaution Comments: L sided intra abdominal drain Restrictions Weight Bearing Restrictions: No    Mobility  Bed Mobility Overal bed mobility: Independent                  Transfers Overall transfer level: Needs assistance Equipment used: None Transfers: Sit to/from Stand Sit to Stand: Supervision            Ambulation/Gait Ambulation/Gait assistance: Min guard Gait Distance (Feet): 750 Feet Assistive device: None Gait Pattern/deviations: WFL(Within Functional Limits) Gait velocity: decr Gait velocity interpretation: 1.31 - 2.62 ft/sec, indicative of limited community ambulator General Gait Details:  (min guard for safety, slightly slowed, steady gait in hallway)       Balance Overall balance assessment: Mild deficits observed, not formally  tested                                          Cognition Arousal/Alertness: Awake/alert Behavior During Therapy: WFL for tasks assessed/performed Overall Cognitive Status: Within Functional Limits for tasks assessed                                           General Comments General comments (skin integrity, edema, etc.): Educated pt on need for both rest and conservation of energy with activity, graded return to PLOF      Pertinent Vitals/Pain Pain Assessment: Faces Faces Pain Scale: Hurts a little bit Pain Location: abdomen with movement Pain Descriptors / Indicators: Discomfort Pain Intervention(s): Limited activity within patient's tolerance;Monitored during session;Repositioned     PT Goals (current goals can now be found in the care plan section) Acute Rehab PT Goals Patient Stated Goal: to return home to daughter PT Goal Formulation: With patient Time For Goal Achievement: 10/09/20 Potential to Achieve Goals: Good Progress towards PT goals: Progressing toward goals    Frequency    Min 3X/week      PT Plan Current plan remains appropriate       AM-PAC PT "6 Clicks" Mobility   Outcome Measure  Help needed turning from your back to your side while in a flat bed without using bedrails?: None Help needed moving from lying on your back to sitting on the side  of a flat bed without using bedrails?: None Help needed moving to and from a bed to a chair (including a wheelchair)?: None Help needed standing up from a chair using your arms (e.g., wheelchair or bedside chair)?: None Help needed to walk in hospital room?: None Help needed climbing 3-5 steps with a railing? : A Little 6 Click Score: 23    End of Session Equipment Utilized During Treatment: Gait belt Activity Tolerance: Patient tolerated treatment well Patient left: in bed;with call bell/phone within reach Nurse Communication: Mobility status PT Visit Diagnosis:  Unsteadiness on feet (R26.81);Muscle weakness (generalized) (M62.81);Difficulty in walking, not elsewhere classified (R26.2)     Time: 6151-8343 PT Time Calculation (min) (ACUTE ONLY): 20 min  Charges:  $Therapeutic Exercise: 8-22 mins                     Nicole Browning B. Migdalia Dk PT, DPT Acute Rehabilitation Services Pager [phone] Office [phone]    [place] 10/04/2020, 2:20 PM

## 2020-10-04 NOTE — Progress Notes (Signed)
Nutrition Follow-up  DOCUMENTATION CODES:   Severe malnutrition in context of chronic illness  INTERVENTION:   Recommend initiation of bowel regimen as no BM documented x6 days  -Continue Ensure Enlive po TID -Continue MVI with minerals daily  NUTRITION DIAGNOSIS:   Severe Malnutrition related to chronic illness as evidenced by severe muscle depletion, severe fat depletion. -- ongoing  GOAL:   Patient will meet greater than or equal to 90% of their needs -- progressing  MONITOR:   PO intake, Supplement acceptance, Diet advancement, I & O's, Labs, Weight trends  REASON FOR ASSESSMENT:   Malnutrition Screening Tool    ASSESSMENT:   Pt with a PMH significant for anemia, GERD, perirectal abscess, and recent diagnosis of IBD/UC in April 2022 presented with N/V/abdominal pain.  On presentation, pt was slightly hypotensive which improved with IV fluids.  Creatinine was elevated at 2.54 along with AST of 133, ALT of 231 and total bilirubin of 0.8.  CT of the abdomen and pelvis showed features concerning for sigmoid mass with possible hepatic metastasis. Pt also treated for possible UTI.  7/28 s/p paracentesis w/ small amount of perihepatic ascites noted, 130m removed& sent for culture/cytology; abx started for possible infected ascites vs liver abscess 7/30 s/p EGD which revealed erosive gastritis 8/01 s/p [place] biopsy and aspiration of L liver mass 8/03 s/p CT guided abdominal drain placement   Pt's appetite has been varied, but appears to be improving. Pt with orders for Ensure Enlive/Plus TID and, per RN, is doing well with supplement acceptance. Recommend continue current nutrition plan of care.   PO intake: 10-100% x last 8 recorded meals (43% average intake)  Medications: mvi with minerals, protonix, IV abx Labs: Na 134 (L), ALT 53 (H), CRP 17.2 (H) Hgb 7.6 (L)  No UOP documented x24 hours Drains: 561moutput x24 hours I/O: +7.6L since admit  No BM documented x6  days; recommend initiation of bowel regimen  Diet Order:   Diet Order             Diet regular Room service appropriate? Yes; Fluid consistency: Thin  Diet effective now                   EDUCATION NEEDS:   No education needs have been identified at this time  Skin:  Skin Assessment: Skin Integrity Issues: Skin Integrity Issues:: Other (Comment) Other: puncture L abdomen  Last BM:  7/29  Height:   Ht Readings from Last 1 Encounters:  09/29/20 5' 4"  (1.626 m)    Weight:   Wt Readings from Last 10 Encounters:  10/02/20 59.3 kg  07/24/20 59 kg  07/11/20 59.1 kg  06/22/20 59.9 kg  06/08/20 59.9 kg  04/04/20 59.9 kg  03/26/17 56.7 kg  02/24/16 59 kg  02/21/16 54.4 kg   BMI:  Body mass index is 22.44 kg/m.  Estimated Nutritional Needs:   Kcal:  1700-1900  Protein:  85-95 grams  Fluid:  >1.7L/d    AmLarkin InaMS, RD, LDN (she/her/hers) RD pager number and weekend/on-call pager number located in [employer]

## 2020-10-05 ENCOUNTER — Other Ambulatory Visit (HOSPITAL_COMMUNITY): Payer: Self-pay

## 2020-10-05 DIAGNOSIS — B955 Unspecified streptococcus as the cause of diseases classified elsewhere: Secondary | ICD-10-CM

## 2020-10-05 DIAGNOSIS — R7881 Bacteremia: Secondary | ICD-10-CM

## 2020-10-05 DIAGNOSIS — K651 Peritoneal abscess: Secondary | ICD-10-CM

## 2020-10-05 MED ORDER — ASPIRIN EC 81 MG PO TBEC: 81.0000 mg | DELAYED_RELEASE_TABLET | Freq: Every day | ORAL | 0 refills | Status: AC

## 2020-10-05 MED ORDER — PANTOPRAZOLE SODIUM 40 MG PO TBEC: 40.0000 mg | DELAYED_RELEASE_TABLET | Freq: Every day | ORAL | 2 refills | Status: DC

## 2020-10-05 MED ORDER — ONDANSETRON HCL 4 MG PO TABS: 4.0000 mg | ORAL_TABLET | Freq: Four times a day (QID) | ORAL | 0 refills | Status: DC | PRN

## 2020-10-05 NOTE — Progress Notes (Signed)
Referring Physician(s): Manandhar,S   Supervising Physician: Corrie Mckusick  Patient Status:  [facility] - In-pt  Chief Complaint:  Abdominal pain/ subhepatic fluid collection S/p intraabdominal fluid collection drain placement on 8/3   Subjective:  Pt laying in bed, not in acute distress.  States abdominal pain after eating food last night, no N/V.  States that she wishes to go home, asks "I can leave if I want, right?" Informed patient that I would not recommend leaving AMA.   Allergies: Bactrim [sulfamethoxazole-trimethoprim], Percocet [oxycodone-acetaminophen], and Valium [diazepam]  Medications: Prior to Admission medications   Medication Sig Start Date End Date Taking? Authorizing Provider  aspirin EC 325 MG tablet Take 325 mg by mouth every 6 (six) hours as needed for moderate pain.   Yes [provider]  bismuth subsalicylate (PEPTO BISMOL) 262 MG/15ML suspension Take 30 mLs by mouth every 6 (six) hours as needed for indigestion.   Yes [provider]  ibuprofen (ADVIL) 200 MG tablet Take 200 mg by mouth every 6 (six) hours as needed for moderate pain.   Yes [provider]  levofloxacin (LEVAQUIN) 750 MG tablet Take 1 tablet (750 mg total) by mouth daily for 21 days. 10/03/20 10/24/20 Yes Debbe Odea, MD  mesalamine (LIALDA) 1.2 g EC tablet Take 1 tablet (1.2 g total) by mouth in the morning and at bedtime. 07/11/20  Yes Noralyn Pick, NP  metroNIDAZOLE (FLAGYL) 500 MG tablet Take 1 tablet (500 mg total) by mouth 2 (two) times daily for 21 days. 10/03/20 10/24/20 Yes Debbe Odea, MD  Multiple Vitamins-Minerals (WOMENS MULTIVITAMIN PO) Take 1 tablet by mouth daily.   Yes [provider]  naproxen sodium (ALEVE) 220 MG tablet Take 220 mg by mouth daily as needed (pain).   Yes [provider]  POTASSIUM PO Take 1 tablet by mouth daily.   Yes [provider]  Vitamin D, Ergocalciferol, (DRISDOL) 1.25 MG (50000 UNIT)  CAPS capsule Take 1 capsule (50,000 Units total) by mouth every 7 (seven) days. TAKE ONE CAPSULE BY MOUTH ONCE WEEKLY 07/22/20  Yes Noralyn Pick, NP  VITAMIN E PO Take 1 tablet by mouth daily.   Yes [provider]  doxycycline (VIBRAMYCIN) 100 MG capsule Take 1 capsule (100 mg total) by mouth 2 (two) times daily. Patient not taking: Reported on 09/25/2020 07/09/20   Scot Jun, FNP     Vital Signs: BP 105/69   Pulse (!) 103   Temp 98.1 F (36.7 C) (Oral)   Resp 18   Ht 5' 4"  (1.626 m)   Wt 124 lb 12.5 oz (56.6 kg)   LMP  (LMP Unknown)   SpO2 97%   BMI 21.42 kg/m   Physical Exam Vitals reviewed.  Constitutional:      General: She is not in acute distress.    Appearance: She is well-developed. She is not ill-appearing.  HENT:     Head: Normocephalic and atraumatic.  Pulmonary:     Effort: Pulmonary effort is normal.  Abdominal:     General: Abdomen is flat.  Skin:    General: Skin is warm and dry.     Coloration: Skin is not cyanotic or jaundiced.     Comments: Positive mid abdomen drain to a gravity bag. Site is unremarkable with no erythema, edema, tenderness, bleeding or drainage. Suture and stat lock in place. Dressing is clean, dry, and intact. 10 ml of  purulent, pink colored fluid noted in the bag. Drain aspirates and flushes  well.    Neurological:     Mental Status: She is alert and oriented to person, place, and time.  Psychiatric:        Mood and Affect: Mood normal.        Behavior: Behavior normal.    Imaging: US BIOPSY (LIVER)  Result Date: 10/01/2020 INDICATION: 50 year old female with a history of liver masses, known inflammatory bowel disease, admitted for sepsis. EXAM: ULTRASOUND-GUIDED LIVER MASS BIOPSY ULTRASOUND-GUIDED LIVER MASS CULTURE/ASPIRATION MEDICATIONS: None. ANESTHESIA/SEDATION: Moderate (conscious) sedation was employed during this procedure. A total of Versed 2.0 mg and Fentanyl 75 mcg was administered intravenously.  Moderate Sedation Time: 14 minutes. The patient's level of consciousness and vital signs were monitored continuously by radiology nursing throughout the procedure under my direct supervision. FLUOROSCOPY TIME:  Ultrasound COMPLICATIONS: None PROCEDURE: Informed written consent was obtained from the patient after a thorough discussion of the procedural risks, benefits and alternatives. All questions were addressed. Maximal Sterile Barrier Technique was utilized including caps, mask, sterile gowns, sterile gloves, sterile drape, hand hygiene and skin antiseptic. A timeout was performed prior to the initiation of the procedure. Ultrasound survey of the right liver lobe performed with images stored and sent to PACs. The subxiphoid region was prepped with chlorhexidine in a sterile fashion, and a sterile drape was applied covering the operative field. A sterile gown and sterile gloves were used for the procedure. Local anesthesia was provided with 1% Lidocaine. The patient was prepped and draped sterilely and the skin and subcutaneous tissues were generously infiltrated with 1% lidocaine. A 17 gauge introducer needle was then advanced under ultrasound guidance in subxiphoid location into the left liver, targeting the heterogeneous mass in the posterior left liver. The stylet was removed, and multiple separate 18 gauge core biopsy were retrieved. Samples were placed into both formalin solution, as well as sterile saline solution. The needle was then aspirated upon withdrawal for a liquid culture to be sent to the lab. Final ultrasound image was performed. The patient tolerated the procedure well and remained hemodynamically stable throughout. No complications were encountered and no significant blood loss was encounter. FINDINGS: Ultrasound demonstrates solid appearance of both right liver mass and left liver mass, with no ultrasound correlate of what appears to be some septations and fluid on the prior MR and CT imaging.  IMPRESSION: Status post ultrasound-guided left liver mass biopsy and liver mass culture/aspiration, as diagnostic test for possible metastatic lesion versus infection Signed, Dulcy Fanny. Dellia Nims, RPVI [facility] Electronically Signed   By: Corrie Mckusick D.O.   On: 10/01/2020 10:08   ECHOCARDIOGRAM COMPLETE  Result Date: 10/01/2020    ECHOCARDIOGRAM REPORT   Patient Name:   BELMA DYCHES Date of Exam: 10/01/2020 Medical Rec #:  [id number]      Height:       64.0 in Accession #:    [id number]     Weight:       125.0 lb Date of Birth:  Jan 15, 1971      BSA:          1.602 m Patient Age:    52 years       BP:           126/83 mmHg Patient Gender: F              HR:           96 bpm. Exam Location:  Inpatient Procedure: 2D Echo, Cardiac Doppler and Color Doppler Indications:  Bacteremia  History:        Patient has no prior history of Echocardiogram examinations.                 Arrythmias:Tachycardia. ARF, Anemia, Liver mass.  Sonographer:    Dustin Flock Referring Phys: [id number] [place]  1. Left ventricular ejection fraction, by estimation, is 60 to 65%. The left ventricle has normal function. The left ventricle has no regional wall motion abnormalities. Left ventricular diastolic parameters were normal.  2. Right ventricular systolic function is normal. The right ventricular size is normal. There is mildly elevated pulmonary artery systolic pressure. The estimated right ventricular systolic pressure is 64.6 mmHg.  3. The mitral valve is normal in structure. No evidence of mitral valve regurgitation.  4. The aortic valve is tricuspid. Aortic valve regurgitation is not visualized. No aortic stenosis is present. FINDINGS  Left Ventricle: Left ventricular ejection fraction, by estimation, is 60 to 65%. The left ventricle has normal function. The left ventricle has no regional wall motion abnormalities. The left ventricular internal cavity  size was normal in size. There is  no left ventricular hypertrophy. Left ventricular diastolic parameters were normal. Right Ventricle: The right ventricular size is normal. No increase in right ventricular wall thickness. Right ventricular systolic function is normal. There is mildly elevated pulmonary artery systolic pressure. The tricuspid regurgitant velocity is 2.89  m/s, and with an assumed right atrial pressure of 3 mmHg, the estimated right ventricular systolic pressure is 80.3 mmHg. Left Atrium: Left atrial size was normal in size. Right Atrium: Right atrial size was normal in size. Pericardium: There is no evidence of pericardial effusion. Mitral Valve: The mitral valve is normal in structure. No evidence of mitral valve regurgitation. Tricuspid Valve: The tricuspid valve is normal in structure. Tricuspid valve regurgitation is trivial. Aortic Valve: The aortic valve is tricuspid. Aortic valve regurgitation is not visualized. No aortic stenosis is present. Pulmonic Valve: The pulmonic valve was not well visualized. Pulmonic valve regurgitation is not visualized. Aorta: The aortic root is normal in size and structure. IAS/Shunts: The interatrial septum was not well visualized.  LEFT VENTRICLE PLAX 2D LVIDd:         4.80 cm  Diastology LVIDs:         2.90 cm  LV e' medial:    7.18 cm/s LV PW:         0.80 cm  LV E/e' medial:  15.5 LV IVS:        0.80 cm  LV e' lateral:   13.60 cm/s LVOT diam:     1.90 cm  LV E/e' lateral: 8.2 LV SV:         54 LV SV Index:   34 LVOT Area:     2.84 cm  RIGHT VENTRICLE RV Basal diam:  2.60 cm RV S prime:     13.30 cm/s TAPSE (M-mode): 1.6 cm LEFT ATRIUM             Index       RIGHT ATRIUM           Index LA diam:        3.00 cm 1.87 cm/m  RA Area:     11.10 cm LA Vol (A2C):   37.9 ml 23.66 ml/m RA Volume:   22.20 ml  13.86 ml/m LA Vol (A4C):   32.7 ml 20.41 ml/m LA Biplane Vol: 35.5 ml 22.16 ml/m  AORTIC VALVE LVOT Vmax:   105.00 cm/s LVOT Vmean:  69.900 cm/s LVOT VTI:     0.190 m  AORTA Ao Root diam: 2.20 cm MITRAL VALVE                TRICUSPID VALVE MV Area (PHT): 7.29 cm     TR Peak grad:   33.4 mmHg MV Decel Time: 104 msec     TR Vmax:        289.00 cm/s MV E velocity: 111.00 cm/s MV A velocity: 91.20 cm/s   SHUNTS MV E/A ratio:  1.22         Systemic VTI:  0.19 m                             Systemic Diam: 1.90 cm Oswaldo Milian MD Electronically signed by Oswaldo Milian MD Signature Date/Time: 10/01/2020/5:11:46 PM    Final    CT IMAGE GUIDED DRAINAGE BY PERCUTANEOUS CATHETER  Result Date: 10/04/2020 INDICATION: 50 year old female with abdominal pain and sepsis found to have subhepatic fluid collection on recent CT. EXAM: CT IMAGE GUIDED DRAINAGE BY PERCUTANEOUS CATHETER COMPARISON:  09/30/2020 MEDICATIONS: The patient is currently admitted to the hospital and receiving intravenous antibiotics. The antibiotics were administered within an appropriate time frame prior to the initiation of the procedure. ANESTHESIA/SEDATION: Moderate (conscious) sedation was employed during this procedure. A total of Versed 1.5 mg and Fentanyl 100 mcg was administered intravenously. Moderate Sedation Time: 14 minutes. The patient's level of consciousness and vital signs were monitored continuously by radiology nursing throughout the procedure under my direct supervision. CONTRAST:  None COMPLICATIONS: None immediate. PROCEDURE: Informed written consent was obtained from the patient after a discussion of the risks, benefits and alternatives to treatment. The patient was placed supine on the CT gantry and a pre procedural CT was performed re-demonstrating the known subhepatic abscess/fluid collection. The procedure was planned. A timeout was performed prior to the initiation of the procedure. The midline upper abdominal region was prepped and draped in the usual sterile fashion. The overlying soft tissues were anesthetized with 1% lidocaine with epinephrine. Appropriate trajectory  was planned with the use of a 22 gauge spinal needle. An 18 gauge trocar needle was advanced into the abscess/fluid collection and a short Amplatz super stiff wire was coiled within the collection. Appropriate positioning was confirmed with a limited CT scan. The tract was serially dilated allowing placement of a 10.2 [place] all-purpose drainage catheter. Appropriate positioning was confirmed with a limited postprocedural CT scan. Approximately 50 ml of purulent fluid was aspirated. The tube was connected to a drainage bag and sutured in place. A dressing was placed. The patient tolerated the procedure well without immediate post procedural complication. IMPRESSION: Successful CT guided placement of a 10.2 [place] all purpose drain catheter into the subhepatic fluid collection with aspiration of approximately 50 mL of purulent fluid. Samples were sent to the laboratory as requested by the ordering clinical team. Ruthann Cancer, MD [facility] Electronically Signed   By: Ruthann Cancer MD   On: 10/04/2020 07:58    Labs:  CBC: Recent Labs    09/30/20 0239 10/01/20 0008 10/02/20 0543 10/03/20 0342  WBC 20.0* 18.2* 15.5* 11.6*  HGB 8.1* 7.8* 7.3* 7.6*  HCT 24.2* 24.1* 22.4* 23.1*  PLT 397 497* 450* 485*    COAGS: Recent Labs    09/27/20 0725 10/01/20 0008  INR 1.2 1.2    BMP: Recent Labs    09/30/20 0239 10/01/20  0008 10/02/20 0543 10/03/20 0342  NA 134* 134* 133* 134*  K 4.5 4.3 4.3 4.4  CL 101 101 100 99  CO2 27 27 24 26   GLUCOSE 151* 130* 97 99  BUN 6 <5* <5* <5*  CALCIUM 8.8* 8.7* 8.6* 8.7*  CREATININE 0.76 0.69 0.66 0.75  GFRNONAA >60 >60 >60 >60    LIVER FUNCTION TESTS: Recent Labs    09/30/20 0239 10/01/20 0008 10/02/20 0543 10/03/20 0342  BILITOT 0.5 0.3 0.9 0.5  AST 28 26 34 34  ALT 63* 56* 54* 53*  ALKPHOS 75 76 58 63  PROT 5.9* 6.3* 6.3* 6.4*  ALBUMIN 1.6* 1.7* 1.7* 1.8*    Assessment and  Plan:  50 y.o. female known to IR service for previous aspiration of perihepatic ascites with Dr. Anselm Pancoast on 09/27/2020 and liver lesion biopsy with Dr. Earleen Newport on 10/01/2020.  Patient underwent CT abdomen pelvis with contrast on 09/30/2020 which showed new loculated fluid collection within the anterior abdominal with peritoneal thickening/enhancement, s/p intraabdominal fluid collection drain placement with Dr. Serafina Royals on 8/3.   Pt stable, drain intact, flushes and aspirates well. Puncture site unremarkable, no s/s of bleeding or infection.  OP 80 cc - pink purulent  VSS No CBC obtained after drain placement on 8/3.  Cx pending, no growth so far   Continue with flushing TID, output recording q shift and dressing changes as needed. Would consider additional imaging when output is less than 10 ml for 24 hours not including flush material.    Patient demonstrated strong wishes for discharge, hopes that she will be able to go home over weekend.   If patient were to be disharged before the drain can be removed in the hospital, our clinic will call patient to set up a follow up appointment for CT scan and possible drain injection at the clinic 1-2 weeks after the discharge.  Out patient follow up orders in.    Out patient discharge instruction for the drain and dressing:    Patient to flush the drain with 5 cc normal saline daily Record output daily (subtract 5 cc that was used to flush the drain from the total daily output)  Dressing change as needed and at least once a week.   Further treatment plan per [name]/GI Appreciate and agree with the plan.  Will resume care on Monday, please call IR for questions and concerns.    Electronically Signed: Tera Mater, PA-C 10/05/2020, 9:40 AM   I spent a total of 15 Minutes at the the patient's bedside AND on the patient's hospital floor or unit, greater than 50% of which was counseling/coordinating care for intraabdominal fluid collection drain

## 2020-10-05 NOTE — TOC Initial Note (Signed)
Transition of Care [facility]) - Initial/Assessment Note    Patient Details  Name: Nicole Browning MRN: [id number] Date of Birth: [birth year]  [facility] CM/SW Contact:    Verdell Carmine, RN Phone Number: 10/05/2020, 10:57 AM  Clinical Narrative:                  Patient admitted with liver abbcess, had drain in from IR. [facility]. 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 PO antibiotics will be given per ID. CM will follow if a different plan is decided in regards to antibiotics.   Expected Discharge Plan: Home/Self Care Barriers to Discharge: Continued Medical Work up   Patient Goals and CMS Choice        Expected Discharge Plan and Services Expected Discharge Plan: Home/Self Care   Discharge Planning Services: CM Consult   Living arrangements for the past 2 months: Single Family Home                                      Prior Living Arrangements/Services Living arrangements for the past 2 months: Single Family Home Lives with:: Self Patient language and need for interpreter reviewed:: Yes        Need for Family Participation in Patient Care: Yes (Comment) Care giver support system in place?: Yes (comment)   Criminal Activity/Legal Involvement Pertinent to Current Situation/Hospitalization: No - Comment as needed  Activities of Daily Living Home Assistive Devices/Equipment: None ADL Screening (condition at time of admission) Patient's cognitive ability adequate to safely complete daily activities?: Yes Is the patient deaf or have difficulty hearing?: No Does the patient have difficulty seeing, even when wearing glasses/contacts?: No Does the patient have difficulty concentrating, remembering, or making decisions?: No Patient able to express need for assistance with ADLs?: Yes Does the patient have difficulty dressing or bathing?:  No Independently performs ADLs?: Yes (appropriate for developmental age) Does the patient have difficulty walking or climbing stairs?: No Weakness of Legs: Both Weakness of Arms/Hands: None  Permission Sought/Granted                  Emotional Assessment       Orientation: : Oriented to Self, Oriented to Place, Oriented to  Time, Oriented to Situation Alcohol / Substance Use: Not Applicable Psych Involvement: No (comment)  Admission diagnosis:  ARF (acute renal failure) ([place]) [N17.9] Acute UTI [N39.0] Liver lesion [K76.9] Lesion of colon [K63.9] AKI (acute kidney injury) ([place]) [N17.9] Abdominal pain [R10.9] Patient Active Problem List   Diagnosis Date Noted   Intra-abdominal abscess ([place]) 10/03/2020   AKI (acute kidney injury) ([place]) 10/03/2020   Protein-calorie malnutrition, severe 09/28/2020   IDA (iron deficiency anemia) 07/24/2020   IBD (inflammatory bowel disease) 07/24/2020   Protein-calorie malnutrition ([place]) 02/29/2016   Elevated hemoglobin A1c 02/27/2016   GERD (gastroesophageal reflux disease)    Tobacco use    Anemia of chronic disease    Prediabetes    Perirectal abscess 02/24/2016   PCP:  Kerin Perna, NP Pharmacy:   [facility], [place] AT [address] [place] [address] Phone: [phone] Fax: [id number] - [place], [place] - [address] AT [place]  [place] [address] [place] [address] Phone: [phone] Fax: [phone]  [facility] [address] [address] Phone: [phone] Fax: [phone]     Social Determinants of Health (SDOH) Interventions    Readmission Risk Interventions No flowsheet data found.

## 2020-10-05 NOTE — Progress Notes (Signed)
Daily Rounding Note  10/05/2020, 12:35 PM  LOS: 9 days   SUBJECTIVE:   Chief complaint: Liver, peritoneal, perisigmoid masses, probably infectious.  Abdominal pain   Feels well.  Some diarrhea when she gets abx infusion.  Also had some diarrhea after drinking Ensure and peanut butter last night but it was right after she had also gotten antibiotic. Generally the pain is way better.  She feels well.  She is eating well.  Ready to go home and other providers have said she can probably go home today. Percutaneous liver abscess drain output of 177 mL, 80 mL in the last 2 days, 10 mL recorded so far today.  OBJECTIVE:         Vital signs in last 24 hours:    Temp:  [98.1 F (36.7 C)-98.4 F (36.9 C)] 98.3 F (36.8 C) (08/05 0951) Pulse Rate:  [98-106] 104 (08/05 0951) Resp:  [17-18] 17 (08/05 0951) BP: (105-121)/(65-79) 121/79 (08/05 0951) SpO2:  [97 %-100 %] 100 % (08/05 0951) Weight:  [56.6 kg] 56.6 kg (08/05 0500) Last BM Date: 10/05/20 Filed Weights   09/30/20 0500 10/02/20 0606 10/05/20 0500  Weight: 56.7 kg 59.3 kg 56.6 kg   General: Looks well.  Comfortable, alert Heart: RRR Chest: Clear bilaterally without labored breathing. Abdomen: Soft.  Minimal if any tenderness.  Liver abscess drain positioned on right with minor drainage of serosanguineous and purulent fluid. Extremities: No CCE Neuro/Psych: Does not, cooperative, calm.  Fluid speech.  No gross neurologic deficits.  Intake/Output from previous day: 08/04 0701 - 08/05 0700 In: 605 [P.O.:600] Out: 80 [Drains:80]  Intake/Output this shift: Total I/O In: -  Out: 10 [Drains:10]  Lab Results: Recent Labs    10/03/20 0342  WBC 11.6*  HGB 7.6*  HCT 23.1*  PLT 485*   BMET Recent Labs    10/03/20 0342  NA 134*  K 4.4  CL 99  CO2 26  GLUCOSE 99  BUN <5*  CREATININE 0.75  CALCIUM 8.7*   LFT Recent Labs    10/03/20 0342  PROT 6.4*  ALBUMIN  1.8*  AST 34  ALT 53*  ALKPHOS 63  BILITOT 0.5   PT/INR No results for input(s): LABPROT, INR in the last 72 hours. Hepatitis Panel No results for input(s): HEPBSAG, HCVAB, HEPAIGM, HEPBIGM in the last 72 hours.  Studies/Results: CT IMAGE GUIDED DRAINAGE BY PERCUTANEOUS CATHETER  Result Date: 10/04/2020 INDICATION: 50 year old female with abdominal pain and sepsis found to have subhepatic fluid collection on recent CT. EXAM: CT IMAGE GUIDED DRAINAGE BY PERCUTANEOUS CATHETER COMPARISON:  09/30/2020 MEDICATIONS: The patient is currently admitted to the hospital and receiving intravenous antibiotics. The antibiotics were administered within an appropriate time frame prior to the initiation of the procedure. ANESTHESIA/SEDATION: Moderate (conscious) sedation was employed during this procedure. A total of Versed 1.5 mg and Fentanyl 100 mcg was administered intravenously. Moderate Sedation Time: 14 minutes. The patient's level of consciousness and vital signs were monitored continuously by radiology nursing throughout the procedure under my direct supervision. CONTRAST:  None COMPLICATIONS: None immediate. PROCEDURE: Informed written consent was obtained from the patient after a discussion of the risks, benefits and alternatives to treatment. The patient was placed supine on the CT gantry and a pre procedural CT was performed re-demonstrating the known subhepatic abscess/fluid collection. The procedure was planned. A timeout was performed prior to the initiation of the procedure. The midline upper abdominal region was prepped and draped in  the usual sterile fashion. The overlying soft tissues were anesthetized with 1% lidocaine with epinephrine. Appropriate trajectory was planned with the use of a 22 gauge spinal needle. An 18 gauge trocar needle was advanced into the abscess/fluid collection and a short Amplatz super stiff wire was coiled within the collection. Appropriate positioning was confirmed with a  limited CT scan. The tract was serially dilated allowing placement of a 10.2 [place] all-purpose drainage catheter. Appropriate positioning was confirmed with a limited postprocedural CT scan. Approximately 50 ml of purulent fluid was aspirated. The tube was connected to a drainage bag and sutured in place. A dressing was placed. The patient tolerated the procedure well without immediate post procedural complication. IMPRESSION: Successful CT guided placement of a 10.2 [place] all purpose drain catheter into the subhepatic fluid collection with aspiration of approximately 50 mL of purulent fluid. Samples were sent to the laboratory as requested by the ordering clinical team. Ruthann Cancer, MD [facility] Electronically Signed   By: Ruthann Cancer MD   On: 10/04/2020 07:58    Scheduled Meds:  feeding supplement  237 mL Oral TID BM   mesalamine  1.2 g Oral BID   multivitamin with minerals  1 tablet Oral Daily   pantoprazole  40 mg Oral Daily   sodium chloride flush  5 mL Intracatheter Q8H   Continuous Infusions:  sodium chloride Stopped (10/03/20 0451)   cefTRIAXone (ROCEPHIN)  IV 2 g (10/04/20 1615)   metronidazole 500 mg (10/05/20 1111)   PRN Meds:.sodium chloride, morphine injection, ondansetron **OR** ondansetron (ZOFRAN) IV   ASSESMENT:   Perisigmoid, abdominal, liver masses.  Multiple tumor markers negative/not elevated.  Group F strep peritonitis, bacteremia.  Indeterminant colitis, Crohn's favored based on 4/22 colonoscopy.  7/28 paracentesis, fluid consistent with infection.  8/1 liver biopsy with necrotizing inflammation, suspect abscess but cannot exclude malignancy.  10/03/2020 placement of percutaneous catheter to subhepatic fluid collection.  Fluid showing abundant WBCs, predominantly PMNs but no organisms.  Current antibiotics include metronidazole, ceftriaxone.  Infectious disease involved.  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  We will probably convert to oral Levaquin, metronidazole at discharge with plans for 3 to 4 weeks therapy.  No recent fever.  WBCs steadily improving.  Severe malnutrition in context of chronic illness.  Ensure supplement along with regular diet.  Sacroiliitis per CT.  Elevated LFTs in setting of liver abscesses.  These are steadily improving and near normal.     Normocytic anemia.  Hgb nadir 7.3 - 7.6 three days ago, has not been checked since then. Hgb of 10.2 in May, 11.7 at the end of July.   PLAN   Agree that patient okay to discharge home on ID recommended oral antibiotics.  I switched her follow-up with Dr. Tarri Glenn that was scheduled for next week to app appointment on September 1.  She will also be following up with IR and ID.  IR plans to see her within 1 to 2 weeks of discharge. Continue Lialda 1.2 g bid.  Diet as tolerated, no restricitions Recheck CBC in ~ 10 days.      Azucena Freed  10/05/2020, 12:35 PM Phone [phone]

## 2020-10-05 NOTE — Progress Notes (Signed)
[facility] Infectious Diseases Follow Up Note  Patient Identification: Patient Name: Nicole Browning MRN: [id number] [place] Date: 09/25/2020  4:35 PM Age: 50 y.o.Today's Date: 10/05/2020   Reason for Visit: Intraabdominal abscesses   Principal Problem:   Intra-abdominal abscess ([place]) Active Problems:   IDA (iron deficiency anemia)   IBD (inflammatory bowel disease)   Protein-calorie malnutrition, severe   AKI (acute kidney injury) ([place])  Antibiotics:  Ceftriaxone 7/26, 8/2-c Metronidazole 8/1-c  Levofloxacin 8/1  Pip/tazo 7/27 - 7/31  Lines/Tubes: PIV   Interval Events: continues to remain afebrile, leukocytosis has been downtrending. No new updates on cultures. Drain output 80 mls in the last 24 hrs    Assessment Hepatic masses (liver abscesses, r/o necrotic malignancy) Peritoneal thickening with ascites concerning for infectious peritonitis versus peritoneal carcinomatosis Sigmoidal wall thickening concerning for neoplasm Multiple intra-abdominal-pelvic fluid collection concerning for abscesses Prior history of perirectal abscesses  Paracentesis 7/28 Streptococcus F , no malignant cells  Liver biopsy 8/1 Streptococcus group F. Path: Liver parenchyma with necrotizing acute inflammation. The findings are suggestive of an abscess.   Blood cultures 09/26/20 no growth in 2/2 sets TTE 10/01/20 with no vegetations S/p CT guided abdominal drain 8/3, cultures no growth top date, no organisms in gram stain  Per GI, malignancy is not excluded but colonic malignancy very unlikely with recent negative colonoscopy. Liver lesions also appear more infectious than necrotic malignancy  Recently diagnosed IBD (more likely Crohn's):  Mesalamine, GI on board Sacroiliitis on CT abdomen pelvis  Recommendations Continue ceftriaxone and metronidazole while in the hospital which can be changed to levofloxacin and metronidazole upon discharge for  approximately 3 weeks. She is very very eager to go home. She tells me she has a 50 year old to take care at home which is currently taken care by others and she cannot afford to stay in the hospital any longer. Adamkantly refuses PICC line and IV antibiotics.  She has an appointment with myself at our [facility] on 8/16 at 3 pm.  Will get a CT abdomen pelvis outpatient to determine further duration of antibiotics Follow-up with GI as OP  Drain management per VIR Monitor CBC and CMP on IV antibiotics I will sign off for now.   Plan discussed with patient/family/ID pharmacy and Primary Rest of the management as per the primary team. Thank you for the consult. Please page with pertinent questions or concerns.  ______________________________________________________________________ Subjective patient seen and examined at the bedside.  She tells me she feels better.  She denies any nausea, vomiting, abdominal pain and diarrhea.  She has been eating good.  She has walked with physical therapy and did not have any complaints.  She tells me she is eager to go home because she has a 50 year old which is currently taken care of by other people.  She adamantly refuses having PICC line and IV antibiotics.  I discussed with her about her p.o. antibiotic regimen and follow-up made at [facility] monitoring her intra-abdominal abscesses.   Vitals BP 105/69   Pulse (!) 103   Temp 98.1 F (36.7 C) (Oral)   Resp 18   Ht 5' 4"  (1.626 m)   Wt 56.6 kg   LMP  (LMP Unknown)   SpO2 97%   BMI 21.42 kg/m     Physical Exam Constitutional: Lying in bed, appears comfortable, not in acute distress    Comments:   Cardiovascular:     Rate and Rhythm: Normal rate and regular rhythm.     Heart sounds:  Pulmonary:     Effort: Pulmonary effort is normal.     Comments: On room air  Abdominal:     Palpations: Abdomen is soft.     Tenderness: Mildly distended but not tender.  Drain in the right lower abdomen with cloudy  fluid in the drainage bulb  Musculoskeletal:        General: No swelling or tenderness.   Skin:    Comments: No Lesions or rashes  Neurological:     General: No focal deficit present.   Psychiatric:        Mood and Affect: Mood normal.   Pertinent Microbiology Results for orders placed or performed during the hospital encounter of 09/25/20  Urine Culture     Status: Abnormal   Collection Time: 09/25/20 10:01 PM   Specimen: Urine, Clean Catch  Result Value Ref Range Status   Specimen Description URINE, CLEAN CATCH  Final   Special Requests   Final    NONE Performed at [facility], [place] [address], [place], [place] [address]    Culture MULTIPLE SPECIES PRESENT, SUGGEST RECOLLECTION (A)  Final   Report Status 09/27/2020 FINAL  Final  Resp Panel by RT-PCR (Flu A&B, Covid) Nasopharyngeal Swab     Status: None   Collection Time: 09/25/20 10:09 PM   Specimen: Nasopharyngeal Swab; Nasopharyngeal(NP) swabs in vial transport medium  Result Value Ref Range Status   SARS Coronavirus 2 by RT PCR NEGATIVE NEGATIVE Final    Comment: (NOTE) SARS-CoV-2 target nucleic acids are NOT DETECTED.  The SARS-CoV-2 RNA is generally detectable in upper respiratory specimens during the acute phase of infection. The lowest concentration of SARS-CoV-2 viral copies this assay can detect is 138 copies/mL. A negative result does not preclude SARS-Cov-2 infection and should not be used as the sole basis for treatment or other patient management decisions. A negative result may occur with  improper specimen collection/handling, submission of specimen other than nasopharyngeal swab, presence of viral mutation(s) within the areas targeted by this assay, and inadequate number of viral copies(<138 copies/mL). A negative result must be combined with clinical observations, patient history, and epidemiological information. The expected result is Negative.  Fact Sheet for Patients:   EntrepreneurPulse.com.au  Fact Sheet for Healthcare Providers:  IncredibleEmployment.be  This test is no t yet approved or cleared by the [place] FDA and  has been authorized for detection and/or diagnosis of SARS-CoV-2 by FDA under an Emergency Use Authorization (EUA). This EUA will remain  in effect (meaning this test can be used) for the duration of the COVID-19 declaration under Section 564(b)(1) of the Act, 21 U.S.C.section 360bbb-3(b)(1), unless the authorization is terminated  or revoked sooner.       Influenza A by PCR NEGATIVE NEGATIVE Final   Influenza B by PCR NEGATIVE NEGATIVE Final    Comment: (NOTE) The Xpert Xpress SARS-CoV-2/FLU/RSV plus assay is intended as an aid in the diagnosis of influenza from Nasopharyngeal swab specimens and should not be used as a sole basis for treatment. Nasal washings and aspirates are unacceptable for Xpert Xpress SARS-CoV-2/FLU/RSV testing.  Fact Sheet for Patients: EntrepreneurPulse.com.au  Fact Sheet for Healthcare Providers: IncredibleEmployment.be  This test is not yet approved or cleared by the [place] FDA and has been authorized for detection and/or diagnosis of SARS-CoV-2 by FDA under an Emergency Use Authorization (EUA). This EUA will remain in effect (meaning this test can be used) for the duration of the COVID-19 declaration under Section 564(b)(1) of the Act,  21 U.S.C. section 360bbb-3(b)(1), unless the authorization is terminated or revoked.  Performed at [facility], [place] [address], [place], [place] [address]   Culture, blood (routine x 2)     Status: None   Collection Time: 09/26/20  9:40 PM   Specimen: BLOOD  Result Value Ref Range Status   Specimen Description BLOOD SITE NOT SPECIFIED  Final   Special Requests   Final    BOTTLES DRAWN AEROBIC AND ANAEROBIC Blood Culture adequate volume   Culture   Final    NO GROWTH  5 DAYS Performed at [facility], [address] [place], [place] [address]    Report Status 10/01/2020 FINAL  Final  Culture, blood (routine x 2)     Status: None   Collection Time: 09/26/20  9:46 PM   Specimen: BLOOD  Result Value Ref Range Status   Specimen Description BLOOD LEFT ANTECUBITAL  Final   Special Requests   Final    BOTTLES DRAWN AEROBIC AND ANAEROBIC Blood Culture adequate volume   Culture   Final    NO GROWTH 5 DAYS Performed at [facility], [place] [address], [place], [place] [address]    Report Status 10/01/2020 FINAL  Final  Body fluid culture w Gram Stain     Status: None   Collection Time: 09/27/20 11:30 AM   Specimen: Peritoneal Washings; Peritoneal Fluid  Result Value Ref Range Status   Specimen Description PERITONEAL FLUID  Final   Special Requests NONE  Final   Gram Stain   Final    ABUNDANT WBC PRESENT, PREDOMINANTLY PMN NO ORGANISMS SEEN    Culture   Final    MODERATE STREPTOCOCCUS GROUP F CRITICAL RESULT CALLED TO, READ BACK BY AND VERIFIED WITH: RN E.BLUE AT [id number] ON 10/01/2020 BY T.SAAD. Performed at [facility], [place] [address], [place], [place] [address]    Report Status 10/01/2020 FINAL  Final   Organism ID, Bacteria STREPTOCOCCUS GROUP F  Final      Susceptibility   Streptococcus group f - MIC*    PENICILLIN INTERMEDIATE Intermediate     CEFTRIAXONE 1 SENSITIVE Sensitive     ERYTHROMYCIN <=0.12 SENSITIVE Sensitive     LEVOFLOXACIN 0.5 SENSITIVE Sensitive     VANCOMYCIN 0.5 SENSITIVE Sensitive     * MODERATE STREPTOCOCCUS GROUP F  Aerobic/Anaerobic Culture w Gram Stain (surgical/deep wound)     Status: None (Preliminary result)   Collection Time: 10/01/20  9:30 AM   Specimen: Tissue  Result Value Ref Range Status   Specimen Description TISSUE  Final   Special Requests LEFT LIVER MASS  Final   Gram Stain   Final    NO WBC SEEN NO ORGANISMS SEEN Performed at [facility], [place] [address], [place], [place]  [address]    Culture   Final    RARE STREPTOCOCCUS GROUP F Beta hemolytic streptococci are predictably susceptible to penicillin and other beta lactams. Susceptibility testing not routinely performed. CRITICAL RESULT CALLED TO, READ BACK BY AND VERIFIED WITH: RN G.REN AT [id number] ON 10/03/2020 BY T.SAAD NO ANAEROBES ISOLATED; CULTURE IN PROGRESS FOR 5 DAYS    Report Status PENDING  Incomplete  Aerobic/Anaerobic Culture w Gram Stain (surgical/deep wound)     Status: None (Preliminary result)   Collection Time: 10/01/20  9:47 AM   Specimen: Abscess  Result Value Ref Range Status   Specimen Description ABSCESS  Final   Special Requests LEFT LIVER  Final  Gram Stain   Final    NO WBC SEEN NO ORGANISMS SEEN Performed at [facility], [place] [address], [place], [place] [address]    Culture   Final    RARE STREPTOCOCCUS GROUP F Beta hemolytic streptococci are predictably susceptible to penicillin and other beta lactams. Susceptibility testing not routinely performed. NO ANAEROBES ISOLATED; CULTURE IN PROGRESS FOR 5 DAYS    Report Status PENDING  Incomplete  Culture, blood (routine x 2)     Status: None (Preliminary result)   Collection Time: 10/02/20  5:43 AM   Specimen: BLOOD RIGHT HAND  Result Value Ref Range Status   Specimen Description BLOOD RIGHT HAND  Final   Special Requests   Final    BOTTLES DRAWN AEROBIC ONLY Blood Culture adequate volume   Culture   Final    NO GROWTH 2 DAYS Performed at [facility], [address], [place], [place] [address]    Report Status PENDING  Incomplete  Culture, blood (routine x 2)     Status: None (Preliminary result)   Collection Time: 10/02/20  5:48 AM   Specimen: BLOOD  Result Value Ref Range Status   Specimen Description BLOOD LEFT ANTECUBITAL  Final   Special Requests   Final    BOTTLES DRAWN AEROBIC ONLY Blood Culture adequate volume   Culture   Final    NO GROWTH 2 DAYS Performed at [facility], [place] [address],  [place], [place] [address]    Report Status PENDING  Incomplete  Aerobic/Anaerobic Culture w Gram Stain (surgical/deep wound)     Status: None (Preliminary result)   Collection Time: 10/03/20  4:36 PM   Specimen: Abscess  Result Value Ref Range Status   Specimen Description ABSCESS  Final   Special Requests ABDOMEN  Final   Gram Stain   Final    ABUNDANT WBC PRESENT, PREDOMINANTLY PMN NO ORGANISMS SEEN    Culture   Final    NO GROWTH < 24 HOURS Performed at [facility], [place] [address], [place],  [address]    Report Status PENDING  Incomplete   Pertinent Lab. CBC Latest Ref Rng & Units 10/03/2020 10/02/2020 10/01/2020  WBC 4.0 - 10.5 K/uL 11.6(H) 15.5(H) 18.2(H)  Hemoglobin 12.0 - 15.0 g/dL 7.6(L) 7.3(L) 7.8(L)  Hematocrit 36.0 - 46.0 % 23.1(L) 22.4(L) 24.1(L)  Platelets 150 - 400 K/uL 485(H) 450(H) 497(H)   CMP Latest Ref Rng & Units 10/03/2020 10/02/2020 10/01/2020  Glucose 70 - 99 mg/dL 99 97 130(H)  BUN 6 - 20 mg/dL <5(L) <5(L) <5(L)  Creatinine 0.44 - 1.00 mg/dL 0.75 0.66 0.69  Sodium 135 - 145 mmol/L 134(L) 133(L) 134(L)  Potassium 3.5 - 5.1 mmol/L 4.4 4.3 4.3  Chloride 98 - 111 mmol/L 99 100 101  CO2 22 - 32 mmol/L 26 24 27   Calcium 8.9 - 10.3 mg/dL 8.7(L) 8.6(L) 8.7(L)  Total Protein 6.5 - 8.1 g/dL 6.4(L) 6.3(L) 6.3(L)  Total Bilirubin 0.3 - 1.2 mg/dL 0.5 0.9 0.3  Alkaline Phos 38 - 126 U/L 63 58 76  AST 15 - 41 U/L 34 34 26  ALT 0 - 44 U/L 53(H) 54(H) 56(H)    Pertinent Imaging today Plain films and CT images have been personally visualized and interpreted; radiology reports have been reviewed. Decision making incorporated into the Impression / Recommendations.  I spent more than 35 minutes for this patient encounter including review of prior medical records, coordination of care  with greater than  50% of time being face to face/counseling and discussing diagnostics/treatment plan with the patient/family.  Electronically signed by:   [name],  MD Infectious Disease Physician [facility] Pager: [phone]

## 2020-10-05 NOTE — Progress Notes (Signed)
Discharge instructions reviewed with patient utilizing teach back method.  Abdominal drain education completed. Patient discharged to home.

## 2020-10-05 NOTE — Discharge Summary (Signed)
Nicole Browning, is a 50 y.o. female  DOB 08-22-1970  MRN [id number].  Admission date:  09/25/2020  Admitting Physician  Aline August, MD  Discharge Date:  10/05/2020   Primary MD  Kerin Perna, NP  Recommendations for primary care physician for things to follow:   1)Out patient discharge instruction for the Abdominal drain and dressing:    Patient to flush the drain with 5 cc normal saline daily  Record output daily (subtract 5 cc that was used to flush the drain from the total daily output)  Dressing change as needed and at least once a week.  2)follow up with interventional radiology---for CT scan and possible drain injection at the clinic 1 week after the discharge  3)follow up with Dr  [name] (infectious disease) on 10/16/20 at 3 PM --at [facility] office   4)Follow up with GI physician--- - Dr. Tarri Glenn that was scheduled for next week to app appointment on September 1.  5)Please take Levaquin and Flagyl/metronidazole antibiotic as prescribed for 3 weeks through 10/24/2020--- avoid alcohol while taking these antibiotics   Admission Diagnosis  ARF (acute renal failure) ([place]) [N17.9] Acute UTI [N39.0] Liver lesion [K76.9] Lesion of colon [K63.9] AKI (acute kidney injury) ([place]) [N17.9] Abdominal pain [R10.9]   Discharge Diagnosis  ARF (acute renal failure) ([place]) [N17.9] Acute UTI [N39.0] Liver lesion [K76.9] Lesion of colon [K63.9] AKI (acute kidney injury) ([place]) [N17.9] Abdominal pain [R10.9]    Principal Problem:   Intra-abdominal abscess ([place]) Active Problems:   IDA (iron deficiency anemia)   IBD (inflammatory bowel disease)   Protein-calorie malnutrition, severe   AKI (acute kidney injury) ([place])   Streptococcal bacteremia      Past Medical History:  Diagnosis Date   Anemia    GERD (gastroesophageal reflux disease)    IBS (irritable bowel syndrome)    Perirectal  abscess    Protein-calorie malnutrition ([place]) 02/29/2016   Tobacco use    UC (ulcerative colitis) ([place])     Past Surgical History:  Procedure Laterality Date   BIOPSY  09/29/2020   Procedure: BIOPSY;  Surgeon: Jackquline Denmark, MD;  Location: [facility];  Service: Endoscopy;;   ESOPHAGOGASTRODUODENOSCOPY (EGD) WITH PROPOFOL N/A 09/29/2020   Procedure: ESOPHAGOGASTRODUODENOSCOPY (EGD) WITH PROPOFOL;  Surgeon: Jackquline Denmark, MD;  Location: [facility] ENDOSCOPY;  Service: Endoscopy;  Laterality: N/A;   IRRIGATION AND DEBRIDEMENT ABSCESS N/A 02/24/2016   Procedure: IRRIGATION AND DEBRIDEMENT ABSCESS;  Surgeon: Stark Klein, MD;  Location: [facility];  Service: General;  Laterality: N/A;     HPI  from the history and physical done on the day of admission:    Chief Complaint: Abdominal pain with nausea vomiting.   HPI: Nicole Browning is a 50 y.o. female with history of recently diagnosed inflammatory bowel disease during colonoscopy done in April 2022 about 4 months ago presents to the ER because of abdominal pain with nausea vomiting ongoing for the last 4 days.  Denies any blood in the vomitus.  Denies any diarrhea.  Abdominal pain is mostly periumbilical.  Denies fever chills.  Unable to keep anything.   ED Course: In the ER patient was mildly hypotensive initially improved with fluids.  Labs show elevated creatinine from 1.7 in May 2022 in December 2 0.5 with elevated LFTs AST is 133 ALT is 231 and total bilirubin is 0.8.  Patient's UA is concerning for UTI.  Patient had a CT abdomen pelvis done which shows features concerning for sigmoid mass with possible hepatic metastasis.  Patient was started on ceftriaxone IV fluids admitted for further management.  COVID test is pending.       Hospital Course:     Brief Narrative:  Nicole Browning is a 50 year old female with recent diagnosis of IBD in April 2022 presented with abdominal pain, nausea and vomiting.  On presentation, she was slightly hypotensive  which improved with IV fluids.  Creatinine was elevated at 2.54 along with AST of 133, ALT of 231 and total bilirubin of 0.8.  CT of the abdomen and pelvis showed features concerning for sigmoid mass with possible hepatic metastasis.  She was given a dose of IV Rocephin in the ED for possible UTI.  GI was consulted.  Patient underwent ultrasound-guided paracentesis and antibiotics were started for possible infected ascites versus liver abscess.  General surgery was consulted by GI.  She had EGD on 09/29/2020 which showed erosive gastritis.  She underwent liver biopsy on 10/01/2020 by IR.      A/p  1)Intra-abdominal abscess/ Group F strep peritonitis and bacteremia IBD - Hepatic masses (liver abscesses, r/o necrotic malignancy) Peritoneal thickening with ascites concerning for infectious peritonitis versus peritoneal carcinomatosis Sigmoidal wall thickening concerning for neoplasm Multiple intra-abdominal-pelvic fluid collection concerning for abscesses Prior history of perirectal abscesses -Paracentesis 7/28 Streptococcus F , no malignant cells  Liver biopsy 8/1 Streptococcus group F. Path: Liver parenchyma with necrotizing acute inflammation. The findings are suggestive of an abscess.   Blood cultures 09/26/20 no growth in 2/2 sets TTE 10/01/20 with no vegetations S/p CT guided abdominal drain/s/p IR drainage of subhepatic fluid collection and pigtail cath--on 10/03/20, cultures no growth top date, no organisms in gram stain Per GI, malignancy is not excluded but colonic malignancy very unlikely with recent negative colonoscopy. 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 [place] Bali recommends p.o. Flagyl and Levaquin for 3 weeks with outpatient infectious disease and GI follow-up, as well as Outpatient IR follow-up  advised -Intra-abdominal drain care--- as outlined in discharge instructions  2)IBD-- Recently diagnosed IBD (more likely Crohn's): GI service recommends mesalamine--- outpatient GI follow-up with Dr. Modena Nunnery   3) AKI (acute kidney injury) ([place]) - Cr 2.54-  -Creatinine normalized with IV fluid   4)Erosive esophagitis - noted on EGD --Discharged on proton     5)IDA (iron deficiency anemia) - follows with oncology and was prescribed oral iron in May, 22    6)IBD (inflammatory bowel disease) - cont Mesalamine     7)Protein-calorie malnutrition, severe Interventions: MVI, Ensure Enlive (each supplement provides 350kcal and 20 grams of protein)   Discharge Condition: --Patient threatening to leave [place] if not discharged home today, patient declined PICC line so only oral antibiotics could be prescribed -After discussion with consultants patient is reluctantly discharged home on 10/05/2020 with outpatient appointments as outlined in discharge instructions  Follow UP   Follow-up Information     Suttle, Rosanne Ashing, MD Follow up.  Specialties: Interventional Radiology, Diagnostic Radiology, Radiology Why: follow up CT and possible drain injection at drain clinic 10-14 days after discharge. Our office will call you to set up the appointment. Contact information: [place] [address] [phone]         Noralyn Pick, NP Follow up on 11/01/2020.   Specialty: Gastroenterology Why: 930 AM with Dr Payton Emerald nurse practitioner.  This replaces the appointment for August with Dr. Dennison Bulla information: [place] [address] [phone]         [name], MD Follow up on 10/16/2020.   Specialty: Infectious Diseases Why: 3PM Contact information: [address] [place] [address] [phone]                  Consults obtained -IR/GI/ID  Diet and Activity recommendation:  As  advised  Discharge Instructions    Discharge Instructions     Call MD for:  difficulty breathing, headache or visual disturbances   Complete by: As directed    Call MD for:  persistant dizziness or light-headedness   Complete by: As directed    Call MD for:  persistant nausea and vomiting   Complete by: As directed    Call MD for:  redness, tenderness, or signs of infection (pain, swelling, redness, odor or green/yellow discharge around incision site)   Complete by: As directed    Call MD for:  severe uncontrolled pain   Complete by: As directed    Call MD for:  temperature >100.4   Complete by: As directed    Diet - low sodium heart healthy   Complete by: As directed    Discharge instructions   Complete by: As directed    1)Out patient discharge instruction for the Abdominal drain and dressing:    Patient to flush the drain with 5 cc normal saline daily  Record output daily (subtract 5 cc that was used to flush the drain from the total daily output)  Dressing change as needed and at least once a week.  2)follow up with interventional radiology---for CT scan and possible drain injection at the clinic 1 week after the discharge  3)follow up with Dr  [name] (infectious disease) on 10/16/20 at 3 PM --at [facility] office   4)Follow up with GI physician--- - Dr. Tarri Glenn that was scheduled for next week to app appointment on September 1.  5)Please take Levaquin and Flagyl/metronidazole antibiotic as prescribed for 3 weeks through 10/24/2020--- avoid alcohol while taking these antibiotics   Discharge wound care:   Complete by: As directed    Keep dressing around drain catheter clean and dry change at least twice weekly and as needed if soiled   Increase activity slowly   Complete by: As directed          Discharge Medications     Allergies as of 10/05/2020       Reactions   Bactrim [sulfamethoxazole-trimethoprim] Hives   Immediate body wide hives   Percocet  [oxycodone-acetaminophen] Nausea Only   Valium [diazepam] Nausea Only        Medication List     STOP taking these medications    doxycycline 100 MG capsule Commonly known as: VIBRAMYCIN   naproxen sodium 220 MG tablet Commonly known as: ALEVE       TAKE these medications    aspirin EC 81 MG tablet Take 1 tablet (81 mg total) by mouth daily with breakfast. What  changed:  medication strength how much to take when to take this reasons to take this   bismuth subsalicylate 979 GX/21JH suspension Commonly known as: PEPTO BISMOL Take 30 mLs by mouth every 6 (six) hours as needed for indigestion.   ibuprofen 200 MG tablet Commonly known as: ADVIL Take 200 mg by mouth every 6 (six) hours as needed for moderate pain.   levofloxacin 750 MG tablet Commonly known as: LEVAQUIN Take 1 tablet (750 mg total) by mouth daily for 21 days.   mesalamine 1.2 g EC tablet Commonly known as: LIALDA Take 1 tablet (1.2 g total) by mouth in the morning and at bedtime.   metroNIDAZOLE 500 MG tablet Commonly known as: FLAGYL Take 1 tablet (500 mg total) by mouth 2 (two) times daily for 21 days.   ondansetron 4 MG tablet Commonly known as: ZOFRAN Take 1 tablet (4 mg total) by mouth every 6 (six) hours as needed for nausea.   pantoprazole 40 MG tablet Commonly known as: PROTONIX Take 1 tablet (40 mg total) by mouth daily. Start taking on: October 06, 2020   POTASSIUM PO Take 1 tablet by mouth daily.   Vitamin D (Ergocalciferol) 1.25 MG (50000 UNIT) Caps capsule Commonly known as: DRISDOL Take 1 capsule (50,000 Units total) by mouth every 7 (seven) days. TAKE ONE CAPSULE BY MOUTH ONCE WEEKLY   VITAMIN E PO Take 1 tablet by mouth daily.   WOMENS MULTIVITAMIN PO Take 1 tablet by mouth daily.               Discharge Care Instructions  (From admission, onward)           Start     Ordered   10/05/20 0000  Discharge wound care:       Comments: Keep dressing around  drain catheter clean and dry change at least twice weekly and as needed if soiled   10/05/20 1533            Major procedures and Radiology Reports - PLEASE review detailed and final reports for all details, in brief -   CT ABDOMEN PELVIS WO CONTRAST  Result Date: 09/25/2020 CLINICAL DATA:  Abdominal pain, acute nonlocalized abdominal pain associated with muscle spasms. EXAM: CT ABDOMEN AND PELVIS WITHOUT CONTRAST TECHNIQUE: Multidetector CT imaging of the abdomen and pelvis was performed following the standard protocol without IV contrast. COMPARISON:  February 24, 2016 FINDINGS: Lower chest: Basilar atelectasis/early consolidation on the RIGHT. No effusion. Hepatobiliary: Diffuse central low attenuation in the liver spanning medial an lateral segment of LEFT hepatic lobe along the fissure for false form ligament measuring up to 9.3 x 4.3 cm in total. This appears either anterior 2 or also involving intrahepatic inferior vena cava tracking towards the confluence of the hepatic veins. No pericholecystic stranding. No additional lesions visualized. Pancreas: Not well visualized. Grossly unremarkable and without signs of inflammation. Spleen: Spleen normal size and contour though with serosal disease suspected along the anterior margin. See below. Adrenals/Urinary Tract: Adrenal glands are normal. Kidneys with smooth contours. No hydronephrosis. Urinary bladder is collapsed. Stomach/Bowel: Gastric thickening is suggested in the area of the gastric antrum with some perigastric stranding. Mild distension of small bowel loops in the abdomen without signs of gross obstruction. Colonic loops decompressed distal to the transverse colon. Suggestion of low-attenuation focus along the margin of the sigmoid (image 67/3) this is suspicious for serosal implant with an adjacent soft tissue nodule in the sigmoid mesentery (image 66/6) 2.3 x 1.5 cm. Mid  sigmoid abnormality as discussed below. Visualized portions of the  appendix are normal. Vascular/Lymphatic: Calcified atheromatous plaque, scattered throughout the normal caliber abdominal aorta. Smooth contour of the IVC. Scattered small lymph nodes in the retroperitoneum. None with pathologic enlargement. No gross adenopathy in the abdomen though with ill-defined soft tissue in the porta hepatis that may represent conglomerate adenopathy the largest potentially 13 mm short axis (image 28/3) Scattered lymph nodes adjacent to rectosigmoid colon best seen on coronal image 76, adjacent to either soft tissue implant or large lymph node. Adjacent to the dominant soft tissue nodule adjacent to the colon there is question of colonic thickening from which this area arises. This is at the level of the mid sigmoid colon. Ovaries along the bilateral pelvic sidewalls. Reproductive: Ovaries along the bilateral pelvic sidewalls. No adnexal mass. Uterus unremarkable. Other: While there is no ascites. There is fascial thickening with nodularity, for example on image 28 of series 3 6 mm thickening of the abdominal fascia adjacent to the stomach. Nodularity tracking along the anterior and lateral transversalis and in the lateral conal fascia in the LEFT hemiabdomen. Indistinct appearance of the omental fat raising the question of omental disease. Musculoskeletal: No acute musculoskeletal findings. Signs of sacroiliitis with asymmetric involvement of the RIGHT sacral iliac joint. No destructive bone finding or acute bone process. IMPRESSION: 1. 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 Hepatic MRI or CT with contrast may be helpful as the patient is able for further evaluation. 2. Question of gastric antral thickening.  Areas not well assessed. 3. Signs of sacroiliitis with asymmetric involvement of the RIGHT sacral iliac joint. Correlate with any clinical or laboratory evidence of seronegative  spondyloarthropathy. 4. Juxta diaphragmatic airspace disease in the RIGHT chest favored to represent juxta diaphragmatic atelectasis. 5. Aortic atherosclerosis. Electronically Signed   By: Zetta Bills M.D.   On: 09/25/2020 19:04   CT CHEST W CONTRAST  Result Date: 09/27/2020 CLINICAL DATA:  Cancer of unknown origin. EXAM: CT CHEST WITH CONTRAST TECHNIQUE: Multidetector CT imaging of the chest was performed during intravenous contrast administration. CONTRAST:  43m OMNIPAQUE IOHEXOL 300 MG/ML  SOLN COMPARISON:  September 26, 2020.  September 25, 2020. FINDINGS: Cardiovascular: No significant vascular findings. Normal heart size. No pericardial effusion. Mediastinum/Nodes: Thyroid gland is unremarkable. Possible 2 cm subcarinal lymph node is noted. Wall thickening of distal esophagus is noted concerning for esophagitis. Lungs/Pleura: No pneumothorax is noted. Small left pleural effusion is noted. Mild bilateral posterior basilar subsegmental atelectasis or infiltrates are noted. Upper Abdomen: As noted on prior CT, hepatic masses are noted concerning for malignancy or metastatic disease. Musculoskeletal: No chest wall abnormality. No acute or significant osseous findings. IMPRESSION: Small left pleural effusion is noted. Mild bilateral posterior basilar subsegmental atelectasis or infiltrates are noted. Possible 2 cm subcarinal lymph node is noted which may represent metastatic disease. Wall thickening of distal esophagus is noted concerning for esophagitis. Endoscopy is recommended for further evaluation. Hepatic masses are again noted as described on prior CT scan, concerning for malignancy or metastatic disease. Electronically Signed   By: JMarijo ConceptionM.D.   On: 09/27/2020 20:01   MR PELVIS WO CONTRAST  Addendum Date: 09/26/2020   ADDENDUM REPORT: 09/26/2020 19:50 ADDENDUM: These results were called by telephone at the time of interpretation on 09/26/2020 at 7:49 pm to provider hospitalist DR. SHALHOUB, who  verbally acknowledged these results. Electronically Signed   By: JIlona Sorrel  M.D.   On: 09/26/2020 19:50   Result Date: 09/26/2020 CLINICAL DATA:  50 year old female with acute abdominal pain. Indeterminate perisigmoid masses and liver masses on noncontrast CT from 1 day prior. By report, no sigmoid mass identified on colonoscopy performed in May 2022. EXAM: MRI PELVIS WITHOUT CONTRAST TECHNIQUE: Multiplanar multisequence MR imaging of the pelvis was performed. No intravenous contrast was administered. Study discontinued early at the patient's request, with intravenous contrast not administered and postcontrast sequences not obtained. COMPARISON:  07/24/2020 MRI pelvis.  09/25/2020 CT abdomen/pelvis. FINDINGS: Urinary Tract:  Normal bladder and urethra. Bowel: There is a 3.0 x 2.1 x 2.5 cm mildly T2 hyperintense mass in the posterior left pelvis adherent to the serosa of the sigmoid colon (series 5/image 4), with the suggestion of a tiny amount of nondependent internal gas. There is a separate nearby 2.2 x 1.7 x 1.3 cm T2 hyperintense mass in the sigmoid mesentery (series 5/image 1). There is a separate more anteriorly located thick walled 4.3 x 2.9 x 2.8 cm collection adherent to the sigmoid serosa with air-fluid level (series 3/image 21). There is mild wall thickening of the mid sigmoid colon. Vascular/Lymphatic: No appreciable acute vascular abnormality on this noncontrast scan. No pathologically enlarged inguinal nodes. Reproductive: Normal size anteverted uterus measuring 6.2 x 3.1 x 3.9 cm. No uterine fibroids. Thin endometrium (2 mm thickness) with no discrete endometrial mass and no endometrial cavity fluid. No discrete mass of the uterine cervix, noting this study is not tailored for evaluation of the uterus. The ovaries appear symmetric and small (series 6/image 3 on the right and image 5 on the left). Other: Mild thickening of the bilateral pelvic peritoneum (series 7/image 13 on the right and image 14  on the left). No pelvic ascites. Musculoskeletal: No aggressive appearing focal osseous lesions. IMPRESSION: 1. Limited noncontrast MRI pelvis study, discontinued early at the patient's request. 2. Thick-walled 4.3 x 2.9 x 2.8 cm collection adherent to the proximal sigmoid serosa in the left anterior pelvis with air-fluid level. Separate 3.0 x 2.1 x 2.5 cm T2 hyperintense mass in the posterior left pelvis adherent to the sigmoid serosa with suggestion of a tiny amount of nondependent gas. Separate 2.2 cm T2 hyperintense mass in the sigmoid mesentery. These findings are indeterminate, with differential including gas-containing pelvic abscesses versus peritoneal metastatic disease of uncertain primary. Fistulous communication to the sigmoid colon not excluded. Surgical consultation suggested. 3. Mild thickening of the bilateral pelvic peritoneum, nonspecific, differential includes peritonitis vs. peritoneal carcinomatosis. Electronically Signed: By: Ilona Sorrel M.D. On: 09/26/2020 19:35   MR ABDOMEN WO CONTRAST  Addendum Date: 09/26/2020   ADDENDUM REPORT: 09/26/2020 19:49 ADDENDUM: These results were called by telephone at the time of interpretation on 09/26/2020 at 7:46 pm to provider DR. SHALHOUB, who verbally acknowledged these results. Upon further discussion with Dr. Cyd Silence, the patient is exhibiting clinical findings of sepsis with an elevated WBC count. The differential for the liver masses includes liver abscesses in addition to metastatic disease. Additionally the differential for the peritoneal thickening includes infectious peritonitis in addition to carcinomatosis. IR consultation for consideration of percutaneous biopsy/drainage of these liver lesions is suggested. Electronically Signed   By: Ilona Sorrel M.D.   On: 09/26/2020 19:49   Result Date: 09/26/2020 CLINICAL DATA:  Crohn disease. Indeterminate liver masses on unenhanced CT from earlier today. EXAM: MRI ABDOMEN WITHOUT CONTRAST TECHNIQUE:  Multiplanar multisequence MR imaging was performed without the administration of intravenous contrast. Patient unable to tolerate full exam or IV  contrast. Only axial and coronal T2 weighted imaging obtained. COMPARISON:  09/25/2020 CT abdomen/pelvis. FINDINGS: Motion degraded incomplete MRI abdomen study performed without IV contrast. Lower chest: Streaky curvilinear opacities at the dependent right greater than left lung bases bilaterally, not well evaluated by MRI. Hepatobiliary: Similar heterogeneous T2 signal intensity liver masses measuring 5.6 x 4.7 cm in the caudate lobe (series 15/image 16) and 5.3 x 4.0 cm in the posterior left liver (series 15/image 19). Normal gallbladder with no cholelithiasis. Mild segmental intrahepatic biliary ductal dilatation in the lateral segment left liver lobe. No significant intrahepatic biliary ductal dilatation in the right liver. Common bile duct diameter 3 mm. No choledocholithiasis. No appreciable biliary masses or strictures. Pancreas: No pancreatic mass or duct dilation.  No pancreas divisum. Spleen: Normal size. No mass. Adrenals/Urinary Tract: Normal adrenals. No hydronephrosis. Simple appearing homogeneous T2 hyperintense left renal cysts, largest 2.3 cm in the lower left kidney. No overtly suspicious renal masses. Stomach/Bowel: Normal non-distended stomach. Visualized small and large bowel is normal caliber, with no bowel wall thickening. Vascular/Lymphatic: Nonaneurysmal abdominal aorta. No pathologically enlarged lymph nodes in the abdomen. Other: Trace abdominal ascites. Scattered mild generalized peritoneal thickening, for example in the right pericolic gutter (series 00/QQPYP 34) and left upper quadrant (series 15/image 25). No focal drainable fluid collection. Musculoskeletal: No aggressive appearing focal osseous lesions. IMPRESSION: 1. Limited motion degraded incomplete MRI abdomen study performed without IV contrast, discontinued prior to study completion  at the patient's request. 2. Two similar indeterminate heterogeneous liver masses measuring 5.6 cm in the caudate lobe and 5.3 cm in the posterior left liver, incompletely evaluated. Metastatic disease not excluded. Consider IR consultation for potential ultrasound-guided liver biopsy. If clinically feasible, liver protocol CT abdomen and pelvis without and with IV contrast may be considered for further evaluation prior to potential biopsy. 3. Mild generalized peritoneal thickening. Trace abdominal ascites. 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 4. Streaky curvilinear opacities at the dependent right greater than left lung bases, not well evaluated by MRI. Chest CT may be obtained for further evaluation as clinically warranted. Electronically Signed: By: Ilona Sorrel M.D. On: 09/26/2020 18:25   CT ABDOMEN PELVIS W CONTRAST  Result Date: 09/30/2020 CLINICAL DATA:  Chronic abdominal pain. Prior CT suspicious for sigmoid neoplasm. History of Crohn disease. EXAM: CT ABDOMEN AND PELVIS WITH CONTRAST TECHNIQUE: Multidetector CT imaging of the abdomen and pelvis was performed using the standard protocol following bolus administration of intravenous contrast. CONTRAST:  146m OMNIPAQUE IOHEXOL 300 MG/ML  SOLN COMPARISON:  Abdominopelvic CT of 09/25/2020. Pelvic abdominopelvic MRI 09/26/2020. FINDINGS: Lower chest: Worsened bibasilar aeration with development of right greater than left base airspace disease. Normal heart size with new tiny bilateral pleural effusions. Hepatobiliary: Capsular based masses within the liver may be parenchymal or peritoneal. Example within the caudate lobe at 6.3 x 6.1 cm on 22/3 and the posterior aspect of segment 2-3 at 6.0 x 4.3 cm on 27/3. Gallbladder wall thickening is nonspecific. No calcified stone or biliary duct dilatation. Pancreas: Normal, without mass or ductal dilatation. Spleen:  Normal in size, without focal abnormality. Adrenals/Urinary Tract: Normal adrenal glands. Left renal sinus cyst an interpolar too small to characterize lesion. Normal right kidney. Normal urinary bladder. Stomach/Bowel: Portions of the stomach are decompressed and therefore likely secondarily appear thick walled.The sigmoid is moderately thick walled, including on 71/3. Normal terminal ileum and appendix. Normal small bowel caliber. No complicating  obstruction. Vascular/Lymphatic: Aortic atherosclerosis. No abdominopelvic adenopathy. Porta hepatis nodes of up to 10 mm are not pathologic by size criteria. Reproductive: Normal uterus and adnexa. Other: Development of small volume ascites with loculated fluid collections since 09/25/2020. Example collection with peritoneal thickening and hyperenhancement within the anterior abdomen at 13.8 x 4.3 cm on 42/3. A complex fluid and gas collection cephalad to the thickened sigmoid measures 3.7 x 2.8 cm on 67/3 and is similar to 09/25/2020. Inferior to this thickened sigmoid is a fluid collection as detailed on recent MRI with minimal gas within. Example 3.1 x 2.4 cm on 74/3. No free intraperitoneal air. Presumably iatrogenic nodularity about the anterior abdominal wall. Musculoskeletal: Right sacroiliac joint sclerosis suggest sacroiliitis. IMPRESSION: 1. Findings which are again suspicious for metastatic disease to the peritoneum and possibly hepatic parenchyma. Dominant capsular based liver masses have been detailed previously. A new loculated fluid collection within the anterior abdomen with peritoneal thickening/enhancement. Differential considerations include infected ascites/developing abscess or peritoneal carcinomatosis. Correlate with paracentesis results of 1 day prior. 2. Sigmoid wall thickening which could represent the site of primary neoplasm. Pericolonic complex fluid collections could represent developing abscesses, and were detailed on the prior pelvic MRI.  3. No bowel obstruction or other acute complication. 4. New small bilateral pleural effusions with adjacent atelectasis or infection. 5. Apparent gastric wall thickening, at least partially felt to be due to underdistention. Electronically Signed   By: Abigail Miyamoto M.D.   On: 09/30/2020 14:22   US BIOPSY (LIVER)  Result Date: 10/01/2020 INDICATION: 50 year old female with a history of liver masses, known inflammatory bowel disease, admitted for sepsis. EXAM: ULTRASOUND-GUIDED LIVER MASS BIOPSY ULTRASOUND-GUIDED LIVER MASS CULTURE/ASPIRATION MEDICATIONS: None. ANESTHESIA/SEDATION: Moderate (conscious) sedation was employed during this procedure. A total of Versed 2.0 mg and Fentanyl 75 mcg was administered intravenously. Moderate Sedation Time: 14 minutes. The patient's level of consciousness and vital signs were monitored continuously by radiology nursing throughout the procedure under my direct supervision. FLUOROSCOPY TIME:  Ultrasound COMPLICATIONS: None PROCEDURE: Informed written consent was obtained from the patient after a thorough discussion of the procedural risks, benefits and alternatives. All questions were addressed. Maximal Sterile Barrier Technique was utilized including caps, mask, sterile gowns, sterile gloves, sterile drape, hand hygiene and skin antiseptic. A timeout was performed prior to the initiation of the procedure. Ultrasound survey of the right liver lobe performed with images stored and sent to PACs. The subxiphoid region was prepped with chlorhexidine in a sterile fashion, and a sterile drape was applied covering the operative field. A sterile gown and sterile gloves were used for the procedure. Local anesthesia was provided with 1% Lidocaine. The patient was prepped and draped sterilely and the skin and subcutaneous tissues were generously infiltrated with 1% lidocaine. A 17 gauge introducer needle was then advanced under ultrasound guidance in subxiphoid location into the left  liver, targeting the heterogeneous mass in the posterior left liver. The stylet was removed, and multiple separate 18 gauge core biopsy were retrieved. Samples were placed into both formalin solution, as well as sterile saline solution. The needle was then aspirated upon withdrawal for a liquid culture to be sent to the lab. Final ultrasound image was performed. The patient tolerated the procedure well and remained hemodynamically stable throughout. No complications were encountered and no significant blood loss was encounter. FINDINGS: Ultrasound demonstrates solid appearance of both right liver mass and left liver mass, with no ultrasound correlate of what appears to be some septations and fluid on the prior  MR and CT imaging. IMPRESSION: Status post ultrasound-guided left liver mass biopsy and liver mass culture/aspiration, as diagnostic test for possible metastatic lesion versus infection Signed, Dulcy Fanny. Dellia Nims, RPVI [facility] Radiology Electronically Signed   By: Corrie Mckusick D.O.   On: 10/01/2020 10:08   US Paracentesis  Result Date: 09/27/2020 INDICATION: 50 year old with indeterminate liver lesions and scheduled for ultrasound-guided liver aspiration or biopsy. EXAM: ULTRASOUND-GUIDED PARACENTESIS COMPARISON:  None. MEDICATIONS: Versed 1.5 mg, fentanyl 50 mcg. Patient was monitored by radiology nurse throughout the procedure. Sedation time: 19 minutes COMPLICATIONS: None immediate. TECHNIQUE: The procedure was explained to the patient. The risks and benefits of the procedure were discussed and the patient's questions were addressed. Informed consent was obtained from the patient. The abdomen was evaluated with ultrasound and perihepatic ascites was identified. Consent was also obtained for a paracentesis. Decision was made to perform paracentesis prior to biopsy. The anterior abdomen was prepped with chlorhexidine and sterile field was created.  Skin was anesthetized using 1% lidocaine. A 19 gauge Yueh catheter was directed into the left upper abdominal fluid collection using ultrasound guidance. Opaque brown fluid was aspirated. Approximately 100 mL of fluid was removed. Based on the appearance of the fluid, liver biopsy was not performed at this time. Fluid was sent for culture and cytology. Bandage placed over the puncture site. FINDINGS: Small amount of perihepatic ascites along the left side of the abdomen. 100 mL of opaque brown fluid was removed. The lesions in the caudate and posterior left hepatic lobe were identified. The caudate lesion is more conspicuous. The posterior left hepatic lobe lesion is a subtle heterogeneous lesion. IMPRESSION: Successful ultrasound-guided paracentesis yielding 100 mL brown fluid from the left upper abdomen. Ultrasound-guided liver biopsy was not performed at this time. Biopsy to the posterior left hepatic lobe appears to be feasible if needed in the future. Electronically Signed   By: Markus Daft M.D.   On: 09/27/2020 15:35   ECHOCARDIOGRAM COMPLETE  Result Date: 10/01/2020    ECHOCARDIOGRAM REPORT   Patient Name:   Nicole Browning Date of Exam: 10/01/2020 Medical Rec #:  [id number]      Height:       64.0 in Accession #:    [id number]     Weight:       125.0 lb Date of Birth:  [birth year]      BSA:          1.602 m Patient Age:    73 years       BP:           126/83 mmHg Patient Gender: F              HR:           96 bpm. Exam Location:  Inpatient Procedure: 2D Echo, Cardiac Doppler and Color Doppler Indications:    Bacteremia  History:        Patient has no prior history of Echocardiogram examinations.                 Arrythmias:Tachycardia. ARF, Anemia, Liver mass.  Sonographer:    Dustin Flock Referring Phys: [id number] [place]  1. Left ventricular ejection fraction, by estimation, is 60 to 65%. The left ventricle has normal function. The left ventricle has no regional wall motion abnormalities.  Left ventricular diastolic parameters were normal.  2. Right ventricular systolic function is normal. The right ventricular size is normal. There is mildly elevated pulmonary  artery systolic pressure. The estimated right ventricular systolic pressure is 72.6 mmHg.  3. The mitral valve is normal in structure. No evidence of mitral valve regurgitation.  4. The aortic valve is tricuspid. Aortic valve regurgitation is not visualized. No aortic stenosis is present. FINDINGS  Left Ventricle: Left ventricular ejection fraction, by estimation, is 60 to 65%. The left ventricle has normal function. The left ventricle has no regional wall motion abnormalities. The left ventricular internal cavity size was normal in size. There is  no left ventricular hypertrophy. Left ventricular diastolic parameters were normal. Right Ventricle: The right ventricular size is normal. No increase in right ventricular wall thickness. Right ventricular systolic function is normal. There is mildly elevated pulmonary artery systolic pressure. The tricuspid regurgitant velocity is 2.89  m/s, and with an assumed right atrial pressure of 3 mmHg, the estimated right ventricular systolic pressure is 20.3 mmHg. Left Atrium: Left atrial size was normal in size. Right Atrium: Right atrial size was normal in size. Pericardium: There is no evidence of pericardial effusion. Mitral Valve: The mitral valve is normal in structure. No evidence of mitral valve regurgitation. Tricuspid Valve: The tricuspid valve is normal in structure. Tricuspid valve regurgitation is trivial. Aortic Valve: The aortic valve is tricuspid. Aortic valve regurgitation is not visualized. No aortic stenosis is present. Pulmonic Valve: The pulmonic valve was not well visualized. Pulmonic valve regurgitation is not visualized. Aorta: The aortic root is normal in size and structure. IAS/Shunts: The interatrial septum was not well visualized.  LEFT VENTRICLE PLAX 2D LVIDd:         4.80 cm   Diastology LVIDs:         2.90 cm  LV e' medial:    7.18 cm/s LV PW:         0.80 cm  LV E/e' medial:  15.5 LV IVS:        0.80 cm  LV e' lateral:   13.60 cm/s LVOT diam:     1.90 cm  LV E/e' lateral: 8.2 LV SV:         54 LV SV Index:   34 LVOT Area:     2.84 cm  RIGHT VENTRICLE RV Basal diam:  2.60 cm RV S prime:     13.30 cm/s TAPSE (M-mode): 1.6 cm LEFT ATRIUM             Index       RIGHT ATRIUM           Index LA diam:        3.00 cm 1.87 cm/m  RA Area:     11.10 cm LA Vol (A2C):   37.9 ml 23.66 ml/m RA Volume:   22.20 ml  13.86 ml/m LA Vol (A4C):   32.7 ml 20.41 ml/m LA Biplane Vol: 35.5 ml 22.16 ml/m  AORTIC VALVE LVOT Vmax:   105.00 cm/s LVOT Vmean:  69.900 cm/s LVOT VTI:    0.190 m  AORTA Ao Root diam: 2.20 cm MITRAL VALVE                TRICUSPID VALVE MV Area (PHT): 7.29 cm     TR Peak grad:   33.4 mmHg MV Decel Time: 104 msec     TR Vmax:        289.00 cm/s MV E velocity: 111.00 cm/s MV A velocity: 91.20 cm/s   SHUNTS MV E/A ratio:  1.22         Systemic VTI:  0.19 m  Systemic Diam: 1.90 cm Oswaldo Milian MD Electronically signed by Oswaldo Milian MD Signature Date/Time: 10/01/2020/5:11:46 PM    Final    CT IMAGE GUIDED DRAINAGE BY PERCUTANEOUS CATHETER  Result Date: 10/04/2020 INDICATION: 50 year old female with abdominal pain and sepsis found to have subhepatic fluid collection on recent CT. EXAM: CT IMAGE GUIDED DRAINAGE BY PERCUTANEOUS CATHETER COMPARISON:  09/30/2020 MEDICATIONS: The patient is currently admitted to the hospital and receiving intravenous antibiotics. The antibiotics were administered within an appropriate time frame prior to the initiation of the procedure. ANESTHESIA/SEDATION: Moderate (conscious) sedation was employed during this procedure. A total of Versed 1.5 mg and Fentanyl 100 mcg was administered intravenously. Moderate Sedation Time: 14 minutes. The patient's level of consciousness and vital signs were monitored continuously  by radiology nursing throughout the procedure under my direct supervision. CONTRAST:  None COMPLICATIONS: None immediate. PROCEDURE: Informed written consent was obtained from the patient after a discussion of the risks, benefits and alternatives to treatment. The patient was placed supine on the CT gantry and a pre procedural CT was performed re-demonstrating the known subhepatic abscess/fluid collection. The procedure was planned. A timeout was performed prior to the initiation of the procedure. The midline upper abdominal region was prepped and draped in the usual sterile fashion. The overlying soft tissues were anesthetized with 1% lidocaine with epinephrine. Appropriate trajectory was planned with the use of a 22 gauge spinal needle. An 18 gauge trocar needle was advanced into the abscess/fluid collection and a short Amplatz super stiff wire was coiled within the collection. Appropriate positioning was confirmed with a limited CT scan. The tract was serially dilated allowing placement of a 10.2 [place] all-purpose drainage catheter. Appropriate positioning was confirmed with a limited postprocedural CT scan. Approximately 50 ml of purulent fluid was aspirated. The tube was connected to a drainage bag and sutured in place. A dressing was placed. The patient tolerated the procedure well without immediate post procedural complication. IMPRESSION: Successful CT guided placement of a 10.2 [place] all purpose drain catheter into the subhepatic fluid collection with aspiration of approximately 50 mL of purulent fluid. Samples were sent to the laboratory as requested by the ordering clinical team. Ruthann Cancer, MD [facility] Radiology Electronically Signed   By: Ruthann Cancer MD   On: 10/04/2020 07:58    Micro Results  Recent Results (from the past 240 hour(s))  Urine Culture     Status: Abnormal   Collection Time: 09/25/20 10:01 PM   Specimen: Urine, Clean Catch   Result Value Ref Range Status   Specimen Description URINE, CLEAN CATCH  Final   Special Requests   Final    NONE Performed at [facility], [address] [place], [place] [address]    Culture MULTIPLE SPECIES PRESENT, SUGGEST RECOLLECTION (A)  Final   Report Status 09/27/2020 FINAL  Final  Resp Panel by RT-PCR (Flu A&B, Covid) Nasopharyngeal Swab     Status: None   Collection Time: 09/25/20 10:09 PM   Specimen: Nasopharyngeal Swab; Nasopharyngeal(NP) swabs in vial transport medium  Result Value Ref Range Status   SARS Coronavirus 2 by RT PCR NEGATIVE NEGATIVE Final    Comment: (NOTE) SARS-CoV-2 target nucleic acids are NOT DETECTED.  The SARS-CoV-2 RNA is generally detectable in upper respiratory specimens during the acute phase of infection. The lowest concentration of SARS-CoV-2 viral copies this assay can detect is 138 copies/mL. A negative result does not preclude SARS-Cov-2 infection and should not be used as  the sole basis for treatment or other patient management decisions. A negative result may occur with  improper specimen collection/handling, submission of specimen other than nasopharyngeal swab, presence of viral mutation(s) within the areas targeted by this assay, and inadequate number of viral copies(<138 copies/mL). A negative result must be combined with clinical observations, patient history, and epidemiological information. The expected result is Negative.  Fact Sheet for Patients:  EntrepreneurPulse.com.au  Fact Sheet for Healthcare Providers:  IncredibleEmployment.be  This test is no t yet approved or cleared by the [place] FDA and  has been authorized for detection and/or diagnosis of SARS-CoV-2 by FDA under an Emergency Use Authorization (EUA). This EUA will remain  in effect (meaning this test can be used) for the duration of the COVID-19 declaration under Section 564(b)(1) of the Act, 21 U.S.C.section  360bbb-3(b)(1), unless the authorization is terminated  or revoked sooner.       Influenza A by PCR NEGATIVE NEGATIVE Final   Influenza B by PCR NEGATIVE NEGATIVE Final    Comment: (NOTE) The Xpert Xpress SARS-CoV-2/FLU/RSV plus assay is intended as an aid in the diagnosis of influenza from Nasopharyngeal swab specimens and should not be used as a sole basis for treatment. Nasal washings and aspirates are unacceptable for Xpert Xpress SARS-CoV-2/FLU/RSV testing.  Fact Sheet for Patients: EntrepreneurPulse.com.au  Fact Sheet for Healthcare Providers: IncredibleEmployment.be  This test is not yet approved or cleared by the [place] FDA and has been authorized for detection and/or diagnosis of SARS-CoV-2 by FDA under an Emergency Use Authorization (EUA). This EUA will remain in effect (meaning this test can be used) for the duration of the COVID-19 declaration under Section 564(b)(1) of the Act, 21 U.S.C. section 360bbb-3(b)(1), unless the authorization is terminated or revoked.  Performed at [facility], [place] [address] [place], [place] [address]   Culture, blood (routine x 2)     Status: None   Collection Time: 09/26/20  9:40 PM   Specimen: BLOOD  Result Value Ref Range Status   Specimen Description BLOOD SITE NOT SPECIFIED  Final   Special Requests   Final    BOTTLES DRAWN AEROBIC AND ANAEROBIC Blood Culture adequate volume   Culture   Final    NO GROWTH 5 DAYS Performed at [facility], [address] [place], [place] [address]    Report Status 10/01/2020 FINAL  Final  Culture, blood (routine x 2)     Status: None   Collection Time: 09/26/20  9:46 PM   Specimen: BLOOD  Result Value Ref Range Status   Specimen Description BLOOD LEFT ANTECUBITAL  Final   Special Requests   Final    BOTTLES DRAWN AEROBIC AND ANAEROBIC Blood Culture adequate volume   Culture   Final    NO GROWTH 5 DAYS Performed at [facility], [place] [address], [place], [place] [address]    Report Status 10/01/2020 FINAL  Final  Body fluid culture w Gram Stain     Status: None   Collection Time: 09/27/20 11:30 AM   Specimen: Peritoneal Washings; Peritoneal Fluid  Result Value Ref Range Status   Specimen Description PERITONEAL FLUID  Final   Special Requests NONE  Final   Gram Stain   Final    ABUNDANT WBC PRESENT, PREDOMINANTLY PMN NO ORGANISMS SEEN    Culture   Final    MODERATE STREPTOCOCCUS GROUP F CRITICAL RESULT CALLED TO, READ BACK BY AND VERIFIED WITH: RN E.BLUE AT 2130 ON  10/01/2020 BY T.SAAD. Performed at [facility], [place] [address] [place], [place] [address]    Report Status 10/01/2020 FINAL  Final   Organism ID, Bacteria STREPTOCOCCUS GROUP F  Final      Susceptibility   Streptococcus group f - MIC*    PENICILLIN INTERMEDIATE Intermediate     CEFTRIAXONE 1 SENSITIVE Sensitive     ERYTHROMYCIN <=0.12 SENSITIVE Sensitive     LEVOFLOXACIN 0.5 SENSITIVE Sensitive     VANCOMYCIN 0.5 SENSITIVE Sensitive     * MODERATE STREPTOCOCCUS GROUP F  Aerobic/Anaerobic Culture w Gram Stain (surgical/deep wound)     Status: None (Preliminary result)   Collection Time: 10/01/20  9:30 AM   Specimen: Tissue  Result Value Ref Range Status   Specimen Description TISSUE  Final   Special Requests LEFT LIVER MASS  Final   Gram Stain   Final    NO WBC SEEN NO ORGANISMS SEEN Performed at [facility], [place] [address], [place], [place] [address]    Culture   Final    RARE STREPTOCOCCUS GROUP F Beta hemolytic streptococci are predictably susceptible to penicillin and other beta lactams. Susceptibility testing not routinely performed. CRITICAL RESULT CALLED TO, READ BACK BY AND VERIFIED WITH: RN G.REN AT 1607 ON 10/03/2020 BY T.SAAD NO ANAEROBES ISOLATED; CULTURE IN PROGRESS FOR 5 DAYS    Report Status PENDING  Incomplete  Aerobic/Anaerobic Culture w Gram Stain (surgical/deep wound)     Status: None  (Preliminary result)   Collection Time: 10/01/20  9:47 AM   Specimen: Abscess  Result Value Ref Range Status   Specimen Description ABSCESS  Final   Special Requests LEFT LIVER  Final   Gram Stain   Final    NO WBC SEEN NO ORGANISMS SEEN Performed at [facility], [address] [place], [place] [address]    Culture   Final    RARE STREPTOCOCCUS GROUP F Beta hemolytic streptococci are predictably susceptible to penicillin and other beta lactams. Susceptibility testing not routinely performed. NO ANAEROBES ISOLATED; CULTURE IN PROGRESS FOR 5 DAYS    Report Status PENDING  Incomplete  Culture, blood (routine x 2)     Status: None (Preliminary result)   Collection Time: 10/02/20  5:43 AM   Specimen: BLOOD RIGHT HAND  Result Value Ref Range Status   Specimen Description BLOOD RIGHT HAND  Final   Special Requests   Final    BOTTLES DRAWN AEROBIC ONLY Blood Culture adequate volume   Culture   Final    NO GROWTH 3 DAYS Performed at [facility], [address], [place], [place] [address]    Report Status PENDING  Incomplete  Culture, blood (routine x 2)     Status: None (Preliminary result)   Collection Time: 10/02/20  5:48 AM   Specimen: BLOOD  Result Value Ref Range Status   Specimen Description BLOOD LEFT ANTECUBITAL  Final   Special Requests   Final    BOTTLES DRAWN AEROBIC ONLY Blood Culture adequate volume   Culture   Final    NO GROWTH 3 DAYS Performed at [facility], [place] [address], [place], [place] [address]    Report Status PENDING  Incomplete  Aerobic/Anaerobic Culture w Gram Stain (surgical/deep wound)     Status: None (Preliminary result)   Collection Time: 10/03/20  4:36 PM   Specimen: Abscess  Result Value Ref Range Status   Specimen Description ABSCESS  Final   Special Requests  ABDOMEN  Final   Gram Stain   Final    ABUNDANT WBC PRESENT, PREDOMINANTLY PMN NO ORGANISMS SEEN    Culture   Final    NO GROWTH 2 DAYS NO ANAEROBES ISOLATED;  CULTURE IN PROGRESS FOR 5 DAYS Performed at [place] [address], [place], [place] [address]    Report Status PENDING  Incomplete       Today   Subjective    Nicole Browning today has no new complaints  No fever  Or chills   No Nausea, Vomiting or Diarrhea       Patient has been seen and examined prior to discharge   Objective   Blood pressure 121/79, pulse (!) 104, temperature 98.3 F (36.8 C), temperature source Oral, resp. rate 17, height 5' 4"  (1.626 m), weight 56.6 kg, SpO2 100 %.   Intake/Output Summary (Last 24 hours) at 10/05/2020 1535 Last data filed at 10/05/2020 1305 Gross per 24 hour  Intake 245 ml  Output 35 ml  Net 210 ml    Exam Gen:- Awake Alert, no acute distress  HEENT:- [place].AT, No sclera icterus Neck-Supple Neck,No JVD,.  Lungs-  CTAB , good air movement bilaterally CV- S1, S2 normal, regular Abd-  +ve B.Sounds, Abd Soft, No tenderness, , right upper quadrant drain with scant drainage Extremity/Skin:- No  edema,   good pulses Psych-affect is appropriate, oriented x3 Neuro-no new focal deficits, no tremors    Data Review   CBC w Diff:  Lab Results  Component Value Date   WBC 11.6 (H) 10/03/2020   HGB 7.6 (L) 10/03/2020   HCT 23.1 (L) 10/03/2020   PLT 485 (H) 10/03/2020   LYMPHOPCT 12 10/03/2020   MONOPCT 7 10/03/2020   EOSPCT 0 10/03/2020   BASOPCT 0 10/03/2020    CMP:  Lab Results  Component Value Date   NA 134 (L) 10/03/2020   K 4.4 10/03/2020   CL 99 10/03/2020   CO2 26 10/03/2020   BUN <5 (L) 10/03/2020   CREATININE 0.75 10/03/2020   PROT 6.4 (L) 10/03/2020   ALBUMIN 1.8 (L) 10/03/2020   BILITOT 0.5 10/03/2020   ALKPHOS 63 10/03/2020   AST 34 10/03/2020   ALT 53 (H) 10/03/2020  .   Total Discharge time is about 33 minutes  Roxan Hockey M.D on 10/05/2020 at 3:35 PM  Go to [email or web address] -  for contact info  [facility] - Office  [phone]

## 2020-10-05 NOTE — Discharge Instructions (Signed)
1)Out patient discharge instruction for the Abdominal drain and dressing:    Patient to flush the drain with 5 cc normal saline daily  Record output daily (subtract 5 cc that was used to flush the drain from the total daily output)  Dressing change as needed and at least once a week.  2)follow up with interventional radiology---for CT scan and possible drain injection at the clinic 1 week after the discharge  3)follow up with Dr  [name] (infectious disease) on 10/16/20 at 3 PM --at [facility] office   4)Follow up with GI physician--- - Dr. Tarri Glenn that was scheduled for next week to app appointment on September 1.  5)Please take Levaquin and Flagyl/metronidazole antibiotic as prescribed for 3 weeks through 10/24/2020--- avoid alcohol while taking these antibiotics

## 2020-10-06 LAB — AEROBIC/ANAEROBIC CULTURE W GRAM STAIN (SURGICAL/DEEP WOUND)
Gram Stain: NONE SEEN
Gram Stain: NONE SEEN

## 2020-10-07 LAB — CULTURE, BLOOD (ROUTINE X 2)
Culture: NO GROWTH
Culture: NO GROWTH
Special Requests: ADEQUATE
Special Requests: ADEQUATE

## 2020-10-08 ENCOUNTER — Telehealth: Payer: Self-pay

## 2020-10-08 ENCOUNTER — Other Ambulatory Visit: Payer: Self-pay | Admitting: Infectious Diseases

## 2020-10-08 DIAGNOSIS — K651 Peritoneal abscess: Secondary | ICD-10-CM

## 2020-10-08 NOTE — Telephone Encounter (Signed)
Transition Care Management Follow-up Telephone Call   Date of discharge and from where:[facility] on 10/25/2020 How have you been since you were released from the hospital? stated she is ok overall  Any questions or concerns? No questions/concerns reported.  Items Reviewed: Did the pt receive and understand the discharge instructions provided? have the instructions and have no questions. , stated her daughter has been helping with abdominal drain Verbalized understanding of drain maintenance and daily records of output.  Medications obtained and verified? She said that they have the medication list  and the hospital staff reviewed them in detail prior to discharge. She said that he has all of the medications and they have no questions.  Any new allergies since your discharge? None reported  Do you have support at home? Yes, daughter and girlfriend Other (ie: DME, Home Health, etc)         Functional Questionnaire: (I = Independent and D = Dependent) ADL's:  Independent.        Follow up appointments reviewed:   PCP Hospital f/u appt confirmed? NP Juluis Mire on 08/24/202.  [facility] f/u appt confirmed? scheduled at this time  Are transportation arrangements needed? have transportation   If their condition worsens, is the pt aware to call  their PCP or go to the ED? Yes.Made pt aware if condition worsen or start experiencing rapid weight gain, chest pain, diff breathing, SOB, fevers, or bleading to refer imediately to ED for further evaluation.  Was the patient provided with contact information for the PCP's office or ED? She has the phone number  Was the pt encouraged to call back with questions or concerns?yes

## 2020-10-09 ENCOUNTER — Encounter: Payer: Self-pay | Admitting: Infectious Diseases

## 2020-10-09 LAB — AEROBIC/ANAEROBIC CULTURE W GRAM STAIN (SURGICAL/DEEP WOUND)

## 2020-10-09 NOTE — Progress Notes (Signed)
Component 6 d ago   Specimen Description ABSCESS   Special Requests ABDOMEN   Gram Stain ABUNDANT WBC PRESENT, PREDOMINANTLY PMN  NO ORGANISMS SEEN   Culture RARE STREPTOCOCCUS CONSTELLATUS  NO ANAEROBES ISOLATED  Performed at [facility], [place] [address] [place], [place] [address]   Report Status 10/09/2020 FINAL   Organism ID, Bacteria STREPTOCOCCUS CONSTELLATUS   Resulting Agency [facility]      Susceptibility   Streptococcus constellatus    MIC    CEFTRIAXONE 1 SENSITIVE  Sensitive    ERYTHROMYCIN <=0.12 SENS... Sensitive    LEVOFLOXACIN 0.5 SENSITIVE  Sensitive    PENICILLIN 0.25 INTERM... Intermediate    VANCOMYCIN 0.5 SENSITIVE  Sensitive           Patient discharged on levofloxacin and metronidazole for 21 days with ID follow up as refused PICC line/IV abtx

## 2020-10-12 ENCOUNTER — Ambulatory Visit: Payer: Medicaid Other | Admitting: Gastroenterology

## 2020-10-16 ENCOUNTER — Ambulatory Visit (INDEPENDENT_AMBULATORY_CARE_PROVIDER_SITE_OTHER): Payer: Medicaid Other | Admitting: Infectious Diseases

## 2020-10-16 ENCOUNTER — Telehealth: Payer: Self-pay | Admitting: *Deleted

## 2020-10-16 ENCOUNTER — Encounter: Payer: Self-pay | Admitting: Infectious Diseases

## 2020-10-16 ENCOUNTER — Other Ambulatory Visit: Payer: Self-pay

## 2020-10-16 VITALS — BP 124/80 | HR 118 | Temp 98.4°F | Ht 66.0 in | Wt 123.0 lb

## 2020-10-16 DIAGNOSIS — K750 Abscess of liver: Secondary | ICD-10-CM

## 2020-10-16 DIAGNOSIS — K651 Peritoneal abscess: Secondary | ICD-10-CM | POA: Diagnosis present

## 2020-10-16 DIAGNOSIS — Z5181 Encounter for therapeutic drug level monitoring: Secondary | ICD-10-CM | POA: Diagnosis not present

## 2020-10-16 MED ORDER — METRONIDAZOLE 500 MG PO TABS: 500.0000 mg | ORAL_TABLET | Freq: Two times a day (BID) | ORAL | 0 refills | Status: DC

## 2020-10-16 MED ORDER — LEVOFLOXACIN 750 MG PO TABS: 750.0000 mg | ORAL_TABLET | Freq: Every day | ORAL | 0 refills | Status: DC

## 2020-10-16 NOTE — Telephone Encounter (Signed)
Left message notifying [facility] that the prescriptions were sent in error, asked them to please cancel. Landis Gandy

## 2020-10-16 NOTE — Progress Notes (Signed)
[facility]                                                             [place], [place], [place], [address]                                                                  Phn. [phone]; Fax: [phone]                                                                             Date: 10/17/20  Reason for Referral: HFU for Intraabdominal abscess  Assessment Hepatic masses (liver abscesses, r/o necrotic malignancy) Peritoneal thickening with ascites concerning for infectious peritonitis versus peritoneal carcinomatosis Sigmoidal wall thickening concerning for neoplasm Multiple intra-abdominal-pelvic fluid collection concerning for abscesses Prior history of perirectal abscesses   Paracentesis 7/28 Streptococcus F , no malignant cells  Liver biopsy 8/1 Streptococcus group F. Path: Liver parenchyma with necrotizing acute inflammation. The findings are suggestive of an abscess.   Blood cultures 09/26/20 no growth in 2/2 sets TTE 10/01/20 with no vegetations S/p CT guided abdominal drain 8/3, cultures Streptococcus constellatus  Per GI, malignancy is not excluded but colonic malignancy very unlikely with recent negative colonoscopy. Liver lesions also appear more infectious than necrotic malignancy   Recently diagnosed IBD (more likely Crohn's):  Mesalamine,Follows with GI  Sacroiliitis on CT abdomen pelvis  Plan Continue Levofloxacin and Metronidazole for now pending CT abdomen/pelvis planned for 8/17  Will follow up in 1 week to review CT abdomen/pelvis and determining duration of abtx Will  not do labs today  Follow up with GI and IR as instructed   All questions and concerns were discussed and addressed. Patient verbalized understanding of the plan. ____________________________________________________________________________________________________________________ HP/Interval  Events Here for HFU after recent hospitalization 7/26-8/5 where hospital course was significant for intraabdominal abscess, Liver masses concerning for abscesses vs necrotic malignancy, peritoneal thickening with ascites concerning for infectious peritonitis vs peritoneal carcinomatosis and sigmoidal thickening concerning for neoplasm. She was discharged on PO levaquin and metronidazole given refusal for PICC line and IV abtx.   She is taking the antibiotics as instructed. Denies any side effects with the abtx.She has occasional loose stool, soft which has resolved. Denies nausea, vomiting, abdominal pain. Appetite is good, and she thinks she has gained some weight The drain has not been draining much and there is approx 10-20cc of serosanguinous fluid in the drainage bulb which is accumulated since last 2 days. Denies fevers, chills, nausea and vomiting. She has a follow up with IR tomorrow for CT abdomen/pelvis and drain evaluation.   ROS: Constitutional: Negative for fever, chills, activity change, appetite change, fatigue and unexpected weight change.  Respiratory: Negative for cough, shortness of breath Cardiovascular: Negative for chest  pain, palpitations and leg swelling.  Gastrointestinal: Negative for nausea, vomiting, abdominal pain, diarrhea/constipation, .  Genitourinary: Negative for dysuria, hematuria, flank pain Musculoskeletal: Negative for myalgias, arthralgia, back pain, joint swelling, arthralgias Skin: Negative for rashes, lesions  Neurological: Negative for weakness, dizziness or headache  Past Medical History:  Diagnosis Date   Anemia    GERD (gastroesophageal reflux disease)    IBS (irritable bowel syndrome)    Perirectal abscess    Protein-calorie malnutrition ([place]) 02/29/2016   Tobacco use    UC (ulcerative colitis) ([place])    Past Surgical History:  Procedure Laterality Date   BIOPSY  09/29/2020   Procedure: BIOPSY;  Surgeon: Jackquline Denmark, MD;  Location: ;  Service: Endoscopy;;   ESOPHAGOGASTRODUODENOSCOPY (EGD) WITH PROPOFOL N/A 09/29/2020   Procedure: ESOPHAGOGASTRODUODENOSCOPY (EGD) WITH PROPOFOL;  Surgeon: Jackquline Denmark, MD;  Location: [facility] ENDOSCOPY;  Service: Endoscopy;  Laterality: N/A;   IRRIGATION AND DEBRIDEMENT ABSCESS N/A 02/24/2016   Procedure: IRRIGATION AND DEBRIDEMENT ABSCESS;  Surgeon: Stark Klein, MD;  Location: [facility];  Service: General;  Laterality: N/A;   Current Outpatient Medications on File Prior to Visit  Medication Sig Dispense Refill   aspirin EC 81 MG tablet Take 1 tablet (81 mg total) by mouth daily with breakfast. 30 tablet 0   mesalamine (LIALDA) 1.2 g EC tablet Take 1 tablet (1.2 g total) by mouth in the morning and at bedtime. 60 tablet 1   Multiple Vitamins-Minerals (WOMENS MULTIVITAMIN PO) Take 1 tablet by mouth daily.     pantoprazole (PROTONIX) 40 MG tablet Take 1 tablet (40 mg total) by mouth daily. 30 tablet 2   POTASSIUM PO Take 1 tablet by mouth daily.     Vitamin D, Ergocalciferol, (DRISDOL) 1.25 MG (50000 UNIT) CAPS capsule Take 1 capsule (50,000 Units total) by mouth every 7 (seven) days. TAKE ONE CAPSULE BY MOUTH ONCE WEEKLY 12 capsule 0   VITAMIN E PO Take 1 tablet by mouth daily.     bismuth subsalicylate (PEPTO BISMOL) 262 MG/15ML suspension Take 30 mLs by mouth every 6 (six) hours as needed for indigestion. (Patient not taking: Reported on 10/16/2020)     ibuprofen (ADVIL) 200 MG tablet Take 200 mg by mouth every 6 (six) hours as needed for moderate pain. (Patient not taking: Reported on 10/16/2020)     ondansetron (ZOFRAN) 4 MG tablet Take 1 tablet (4 mg total) by mouth every 6 (six) hours as needed for nausea. (Patient not taking: Reported on 10/16/2020) 20 tablet 0   No current facility-administered medications on file prior to visit.   Allergies  Allergen Reactions   Bactrim [Sulfamethoxazole-Trimethoprim] Hives    Immediate body wide hives   Percocet [Oxycodone-Acetaminophen] Nausea  Only   Valium [Diazepam] Nausea Only   Social History   Socioeconomic History   Marital status: Single    Spouse name: Not on file   Number of children: Not on file   Years of education: Not on file   Highest education level: Not on file  Occupational History   Not on file  Tobacco Use   Smoking status: Every Day    Packs/day: 0.33    Years: 28.00    Pack years: 9.24    Types: Cigarettes   Smokeless tobacco: Never  Vaping Use   Vaping Use: Never used  Substance and Sexual Activity   Alcohol use: Yes    Alcohol/week: 2.0 standard drinks    Types: 2 Glasses of wine per week   Drug use:  No   Sexual activity: Not Currently    Birth control/protection: Injection  Other Topics Concern   Not on file  Social History Narrative   Not on file   Social Determinants of Health   Financial Resource Strain: Not on file  Food Insecurity: Not on file  Transportation Needs: Not on file  Physical Activity: Not on file  Stress: Not on file  Social Connections: Not on file  Intimate Partner Violence: Not on file   Family History  Problem Relation Age of Onset   Diabetes Mother    COPD Father    Leukemia Brother    Colon cancer Neg Hx    Colon polyps Neg Hx    Esophageal cancer Neg Hx    Rectal cancer Neg Hx    Stomach cancer Neg Hx     Vitals BP 124/80   Pulse (!) 118   Temp 98.4 F (36.9 C) (Oral)   Ht 5' 6"  (1.676 m)   Wt 123 lb (55.8 kg)   LMP  (LMP Unknown)   SpO2 100%   BMI 19.85 kg/m    Examination  General - not in acute distress, comfortably sitting in chair HEENT - PEERLA, no pallor and no icterus Chest - b/l clear air entry, no additional sounds CVS- Normal s1s2, RRR Abdomen - Soft, Non tender , non distended, drain site - clean and bulb has 10-20cc of serosanguinous fluid    Ext- no pedal edema Neuro: grossly normal Back - WNL Psych : calm and cooperative   Recent labs CBC Latest Ref Rng & Units 10/03/2020 10/02/2020 10/01/2020  WBC 4.0 - 10.5 K/uL  11.6(H) 15.5(H) 18.2(H)  Hemoglobin 12.0 - 15.0 g/dL 7.6(L) 7.3(L) 7.8(L)  Hematocrit 36.0 - 46.0 % 23.1(L) 22.4(L) 24.1(L)  Platelets 150 - 400 K/uL 485(H) 450(H) 497(H)   CMP Latest Ref Rng & Units 10/03/2020 10/02/2020 10/01/2020  Glucose 70 - 99 mg/dL 99 97 130(H)  BUN 6 - 20 mg/dL <5(L) <5(L) <5(L)  Creatinine 0.44 - 1.00 mg/dL 0.75 0.66 0.69  Sodium 135 - 145 mmol/L 134(L) 133(L) 134(L)  Potassium 3.5 - 5.1 mmol/L 4.4 4.3 4.3  Chloride 98 - 111 mmol/L 99 100 101  CO2 22 - 32 mmol/L 26 24 27   Calcium 8.9 - 10.3 mg/dL 8.7(L) 8.6(L) 8.7(L)  Total Protein 6.5 - 8.1 g/dL 6.4(L) 6.3(L) 6.3(L)  Total Bilirubin 0.3 - 1.2 mg/dL 0.5 0.9 0.3  Alkaline Phos 38 - 126 U/L 63 58 76  AST 15 - 41 U/L 34 34 26  ALT 0 - 44 U/L 53(H) 54(H) 56(H)     Pertinent Microbiology Results for orders placed or performed during the hospital encounter of 09/25/20  Urine Culture     Status: Abnormal   Collection Time: 09/25/20 10:01 PM   Specimen: Urine, Clean Catch  Result Value Ref Range Status   Specimen Description URINE, CLEAN CATCH  Final   Special Requests   Final    NONE Performed at [facility], [place] [address], [place], [place] [address]    Culture MULTIPLE SPECIES PRESENT, SUGGEST RECOLLECTION (A)  Final   Report Status 09/27/2020 FINAL  Final  Resp Panel by RT-PCR (Flu A&B, Covid) Nasopharyngeal Swab     Status: None   Collection Time: 09/25/20 10:09 PM   Specimen: Nasopharyngeal Swab; Nasopharyngeal(NP) swabs in vial transport medium  Result Value Ref Range Status   SARS Coronavirus 2 by RT PCR NEGATIVE NEGATIVE Final    Comment: (NOTE) SARS-CoV-2 target  nucleic acids are NOT DETECTED.  The SARS-CoV-2 RNA is generally detectable in upper respiratory specimens during the acute phase of infection. The lowest concentration of SARS-CoV-2 viral copies this assay can detect is 138 copies/mL. A negative result does not preclude SARS-Cov-2 infection and should not be used as the sole basis  for treatment or other patient management decisions. A negative result may occur with  improper specimen collection/handling, submission of specimen other than nasopharyngeal swab, presence of viral mutation(s) within the areas targeted by this assay, and inadequate number of viral copies(<138 copies/mL). A negative result must be combined with clinical observations, patient history, and epidemiological information. The expected result is Negative.  Fact Sheet for Patients:  EntrepreneurPulse.com.au  Fact Sheet for Healthcare Providers:  IncredibleEmployment.be  This test is no t yet approved or cleared by the [place] FDA and  has been authorized for detection and/or diagnosis of SARS-CoV-2 by FDA under an Emergency Use Authorization (EUA). This EUA will remain  in effect (meaning this test can be used) for the duration of the COVID-19 declaration under Section 564(b)(1) of the Act, 21 U.S.C.section 360bbb-3(b)(1), unless the authorization is terminated  or revoked sooner.       Influenza A by PCR NEGATIVE NEGATIVE Final   Influenza B by PCR NEGATIVE NEGATIVE Final    Comment: (NOTE) The Xpert Xpress SARS-CoV-2/FLU/RSV plus assay is intended as an aid in the diagnosis of influenza from Nasopharyngeal swab specimens and should not be used as a sole basis for treatment. Nasal washings and aspirates are unacceptable for Xpert Xpress SARS-CoV-2/FLU/RSV testing.  Fact Sheet for Patients: EntrepreneurPulse.com.au  Fact Sheet for Healthcare Providers: IncredibleEmployment.be  This test is not yet approved or cleared by the [place] FDA and has been authorized for detection and/or diagnosis of SARS-CoV-2 by FDA under an Emergency Use Authorization (EUA). This EUA will remain in effect (meaning this test can be used) for the duration of the COVID-19 declaration under Section 564(b)(1) of the Act, 21  U.S.C. section 360bbb-3(b)(1), unless the authorization is terminated or revoked.  Performed at [facility], [place] [address] [place], [place] [address]   Culture, blood (routine x 2)     Status: None   Collection Time: 09/26/20  9:40 PM   Specimen: BLOOD  Result Value Ref Range Status   Specimen Description BLOOD SITE NOT SPECIFIED  Final   Special Requests   Final    BOTTLES DRAWN AEROBIC AND ANAEROBIC Blood Culture adequate volume   Culture   Final    NO GROWTH 5 DAYS Performed at [facility], [address] [place], [place] [address]    Report Status 10/01/2020 FINAL  Final  Culture, blood (routine x 2)     Status: None   Collection Time: 09/26/20  9:46 PM   Specimen: BLOOD  Result Value Ref Range Status   Specimen Description BLOOD LEFT ANTECUBITAL  Final   Special Requests   Final    BOTTLES DRAWN AEROBIC AND ANAEROBIC Blood Culture adequate volume   Culture   Final    NO GROWTH 5 DAYS Performed at [facility], [place] [address], [place], [place] [address]    Report Status 10/01/2020 FINAL  Final  Body fluid culture w Gram Stain     Status: None   Collection Time: 09/27/20 11:30 AM   Specimen: Peritoneal Washings; Peritoneal Fluid  Result Value Ref Range Status   Specimen Description PERITONEAL FLUID  Final   Special Requests  NONE  Final   Gram Stain   Final    ABUNDANT WBC PRESENT, PREDOMINANTLY PMN NO ORGANISMS SEEN    Culture   Final    MODERATE STREPTOCOCCUS GROUP F CRITICAL RESULT CALLED TO, READ BACK BY AND VERIFIED WITH: RN E.BLUE AT 7893 ON 10/01/2020 BY T.SAAD. Performed at [facility], [place] [address] [place], [place] [address]    Report Status 10/01/2020 FINAL  Final   Organism ID, Bacteria STREPTOCOCCUS GROUP F  Final      Susceptibility   Streptococcus group f - MIC*    PENICILLIN INTERMEDIATE Intermediate     CEFTRIAXONE 1 SENSITIVE Sensitive     ERYTHROMYCIN <=0.12 SENSITIVE Sensitive     LEVOFLOXACIN 0.5 SENSITIVE  Sensitive     VANCOMYCIN 0.5 SENSITIVE Sensitive     * MODERATE STREPTOCOCCUS GROUP F  Aerobic/Anaerobic Culture w Gram Stain (surgical/deep wound)     Status: None   Collection Time: 10/01/20  9:30 AM   Specimen: Tissue  Result Value Ref Range Status   Specimen Description TISSUE  Final   Special Requests LEFT LIVER MASS  Final   Gram Stain NO WBC SEEN NO ORGANISMS SEEN   Final   Culture   Final    RARE STREPTOCOCCUS GROUP F Beta hemolytic streptococci are predictably susceptible to penicillin and other beta lactams. Susceptibility testing not routinely performed. CRITICAL RESULT CALLED TO, READ BACK BY AND VERIFIED WITH: RN G.REN AT 5102 ON 10/03/2020 BY T.SAAD NO ANAEROBES ISOLATED Performed at [facility], [place] [address] [place], [place] [address]    Report Status 10/06/2020 FINAL  Final  Aerobic/Anaerobic Culture w Gram Stain (surgical/deep wound)     Status: None   Collection Time: 10/01/20  9:47 AM   Specimen: Abscess  Result Value Ref Range Status   Specimen Description ABSCESS  Final   Special Requests LEFT LIVER  Final   Gram Stain NO WBC SEEN NO ORGANISMS SEEN   Final   Culture   Final    RARE STREPTOCOCCUS GROUP F Beta hemolytic streptococci are predictably susceptible to penicillin and other beta lactams. Susceptibility testing not routinely performed. NO ANAEROBES ISOLATED Performed at [facility], [place] [address], [place], [place] [address]    Report Status 10/06/2020 FINAL  Final  Culture, blood (routine x 2)     Status: None   Collection Time: 10/02/20  5:43 AM   Specimen: BLOOD RIGHT HAND  Result Value Ref Range Status   Specimen Description BLOOD RIGHT HAND  Final   Special Requests   Final    BOTTLES DRAWN AEROBIC ONLY Blood Culture adequate volume   Culture   Final    NO GROWTH 5 DAYS Performed at [facility], [place] [address], [place], [place] [address]    Report Status 10/07/2020 FINAL  Final  Culture, blood (routine x 2)      Status: None   Collection Time: 10/02/20  5:48 AM   Specimen: BLOOD  Result Value Ref Range Status   Specimen Description BLOOD LEFT ANTECUBITAL  Final   Special Requests   Final    BOTTLES DRAWN AEROBIC ONLY Blood Culture adequate volume   Culture   Final    NO GROWTH 5 DAYS Performed at [facility], [place] [address] [place],  [address]    Report Status 10/07/2020 FINAL  Final  Aerobic/Anaerobic Culture w Gram Stain (surgical/deep wound)     Status: None   Collection Time:  10/03/20  4:36 PM   Specimen: Abscess  Result Value Ref Range Status   Specimen Description ABSCESS  Final   Special Requests ABDOMEN  Final   Gram Stain   Final    ABUNDANT WBC PRESENT, PREDOMINANTLY PMN NO ORGANISMS SEEN    Culture   Final    RARE STREPTOCOCCUS CONSTELLATUS NO ANAEROBES ISOLATED Performed at [facility], [place] [address], [place], [place] [address]    Report Status 10/09/2020 FINAL  Final   Organism ID, Bacteria STREPTOCOCCUS CONSTELLATUS  Final      Susceptibility   Streptococcus constellatus - MIC*    PENICILLIN 0.25 INTERMEDIATE Intermediate     CEFTRIAXONE 1 SENSITIVE Sensitive     ERYTHROMYCIN <=0.12 SENSITIVE Sensitive     LEVOFLOXACIN 0.5 SENSITIVE Sensitive     VANCOMYCIN 0.5 SENSITIVE Sensitive     * RARE STREPTOCOCCUS CONSTELLATUS   Pertinent Imaging All pertinent labs/Imagings/notes reviewed. All pertinent plain films and CT images have been personally visualized and interpreted; radiology reports have been reviewed. Decision making incorporated into the Impression / Recommendations.  I have spent a total of 60 minutes of face-to-face and non-face-to-face time, excluding clinical staff time, preparing to see patient, ordering tests and/or medications, and provide counseling the patient    Electronically signed by:  [name], MD Infectious Disease Physician [facility] [address] [place], [place] [address] Phone: [phone]  Fax: [phone]

## 2020-10-17 ENCOUNTER — Ambulatory Visit
Admission: RE | Admit: 2020-10-17 | Discharge: 2020-10-17 | Disposition: A | Discharge status: Home / Self Care | Payer: Medicaid Other | Source: Ambulatory Visit | Attending: Infectious Diseases | Admitting: Infectious Diseases

## 2020-10-17 ENCOUNTER — Ambulatory Visit
Admission: RE | Admit: 2020-10-17 | Discharge: 2020-10-17 | Disposition: A | Discharge status: Home / Self Care | Payer: Medicaid Other | Source: Ambulatory Visit | Attending: Student | Admitting: Student

## 2020-10-17 DIAGNOSIS — K750 Abscess of liver: Secondary | ICD-10-CM | POA: Insufficient documentation

## 2020-10-17 DIAGNOSIS — K651 Peritoneal abscess: Secondary | ICD-10-CM

## 2020-10-17 DIAGNOSIS — Z5181 Encounter for therapeutic drug level monitoring: Secondary | ICD-10-CM | POA: Insufficient documentation

## 2020-10-17 HISTORY — PX: IR RADIOLOGIST EVAL & MGMT: IMG5224

## 2020-10-17 IMAGING — CT CT ABD-PELV W/ CM
2 of 4 series · 10 of 46 positions shown, 11 images · IV contrast (iopamidol)
Comparison: [DATE]

CLINICAL DATA: 50-year-old with liver abscesses and intra-abdominal
fluid collection. Patient is status post percutaneous drain
placement.

EXAM:
CT ABDOMEN AND PELVIS WITH CONTRAST
TECHNIQUE: Multidetector CT imaging of the abdomen and pelvis was performed
using the standard protocol following bolus administration of
intravenous contrast.
CONTRAST:  100mL [LA] IOPAMIDOL ([LA]) INJECTION 61%

[Series 2: abd pelvis 5.00 br40 s3 axial · axial · 0.49mm/px · z∈[+1305,+1685]mm · 7 of 92 slices shown, 8 images]
[im 8/92  soft-tissue]
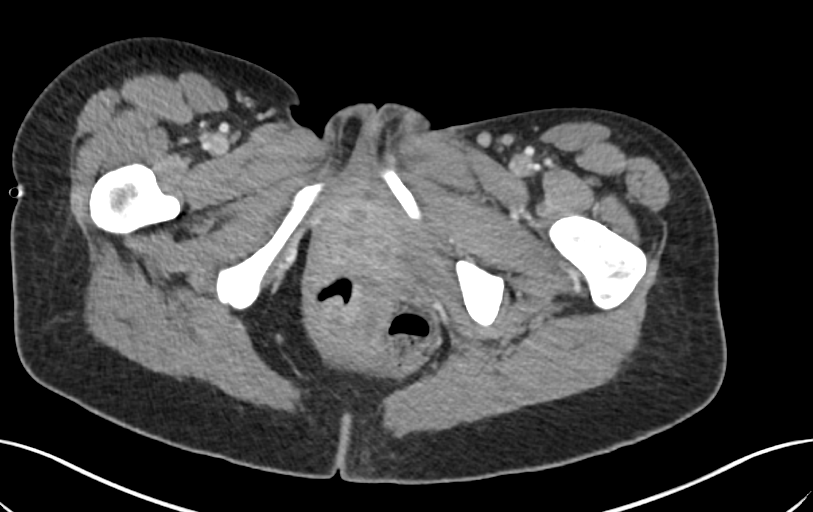
[im 8/92  bone]
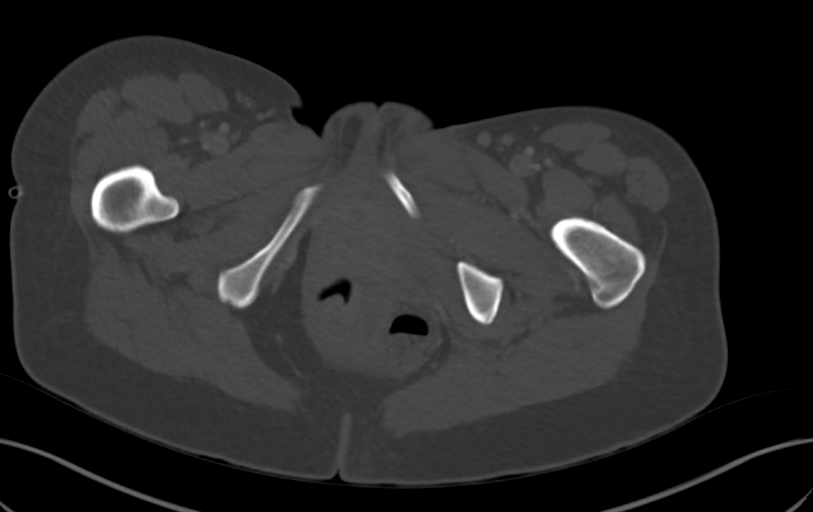
[im 20/92  soft-tissue]
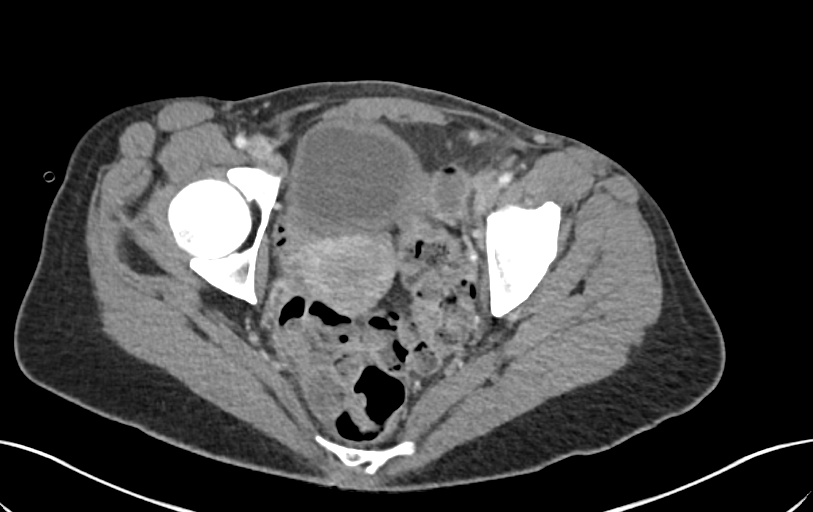
[im 32/92  soft-tissue]
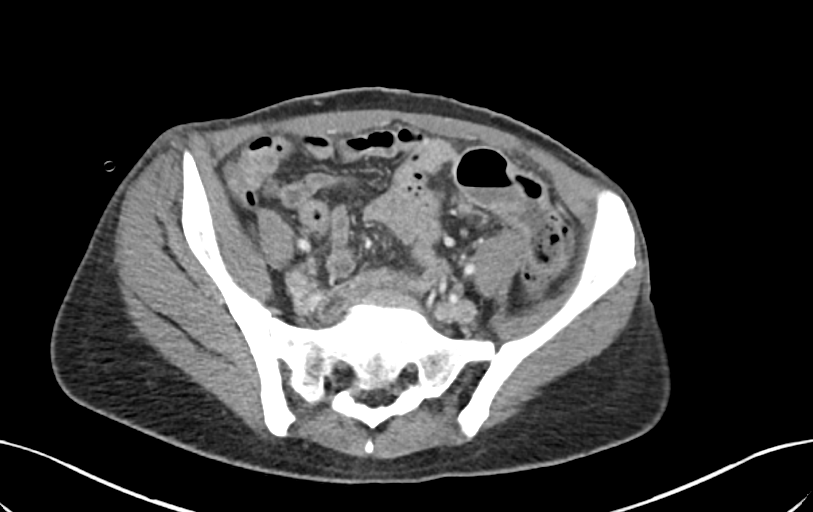
[im 48/92  soft-tissue]
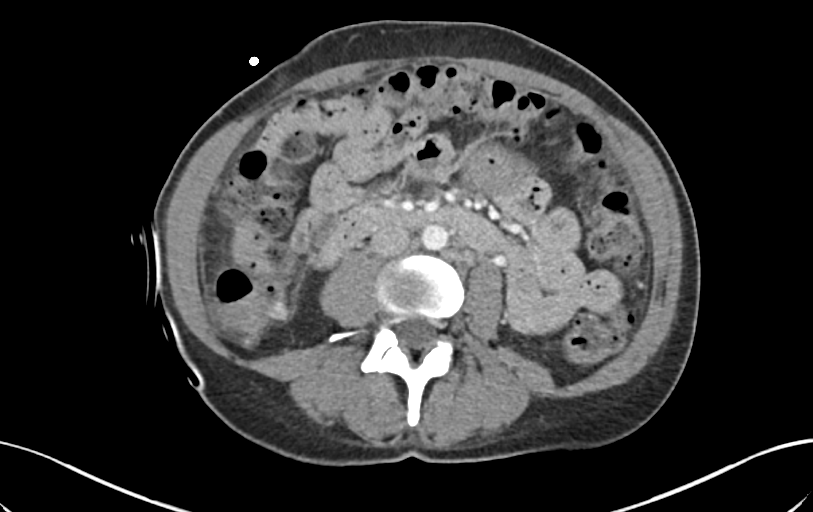
[im 60/92  soft-tissue]
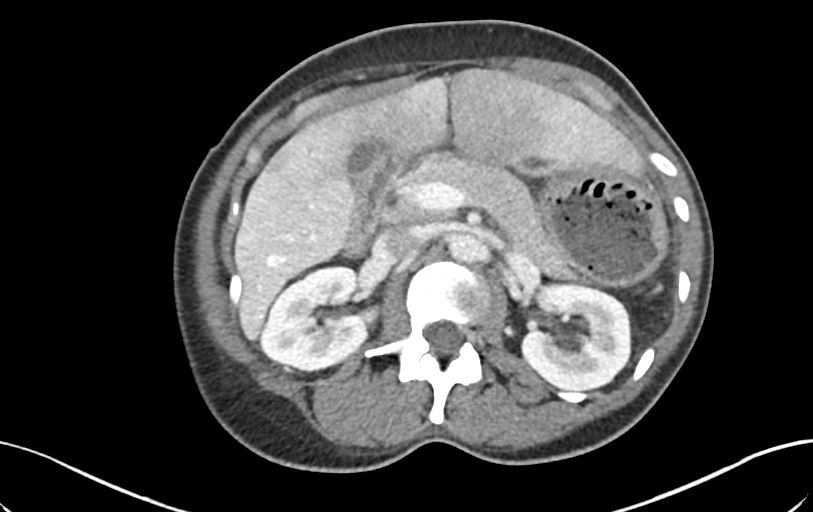
[im 72/92  soft-tissue]
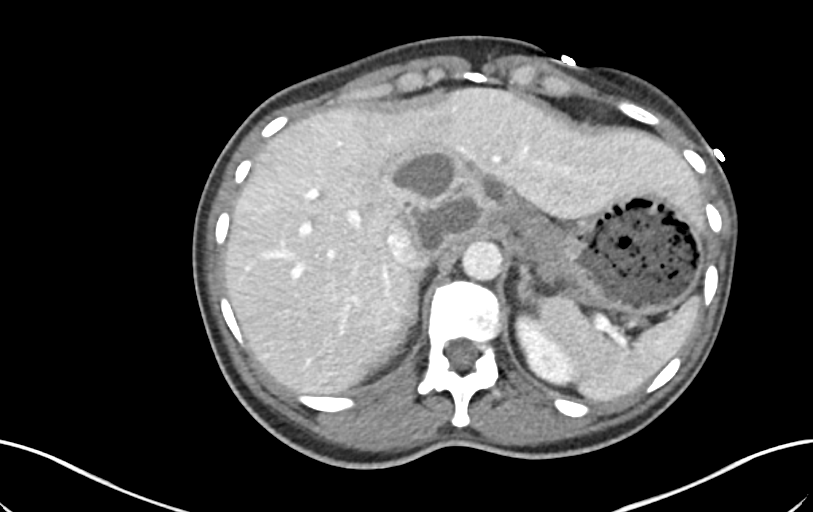
[im 84/92  soft-tissue]
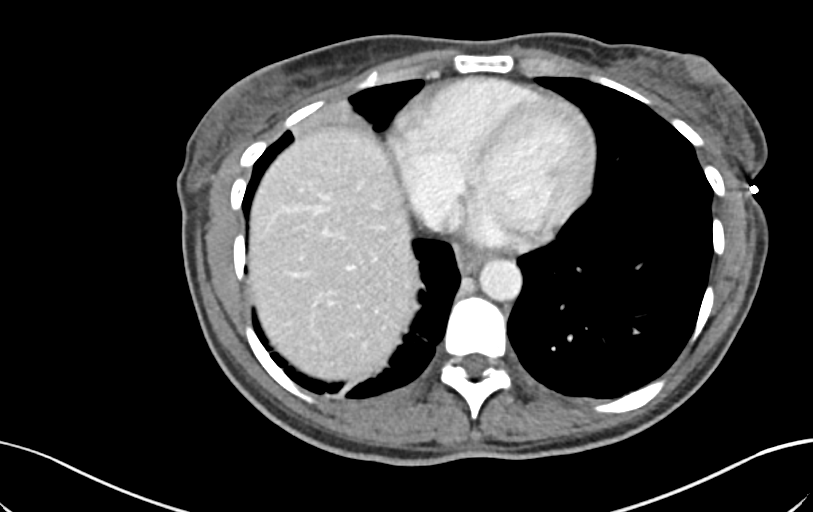

[Series 6: abd pelvis 2.00 br40 s3 cor · coronal · 0.70mm/px · 3 of 120 slices shown]
[im 40/120  soft-tissue]
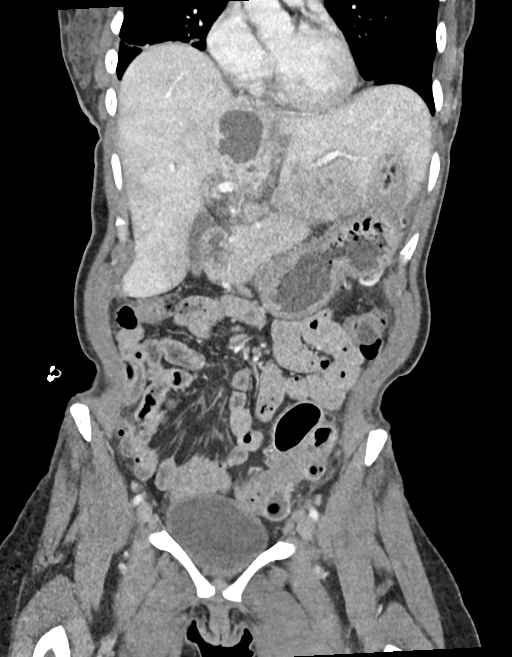
[im 53/120  soft-tissue]
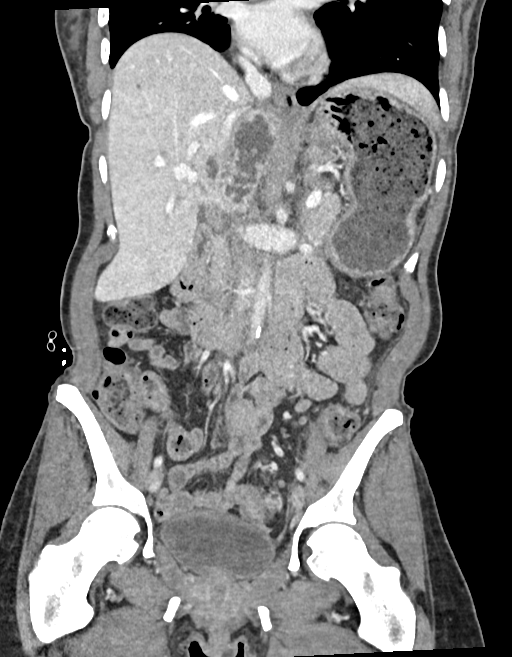
[im 67/120  soft-tissue]
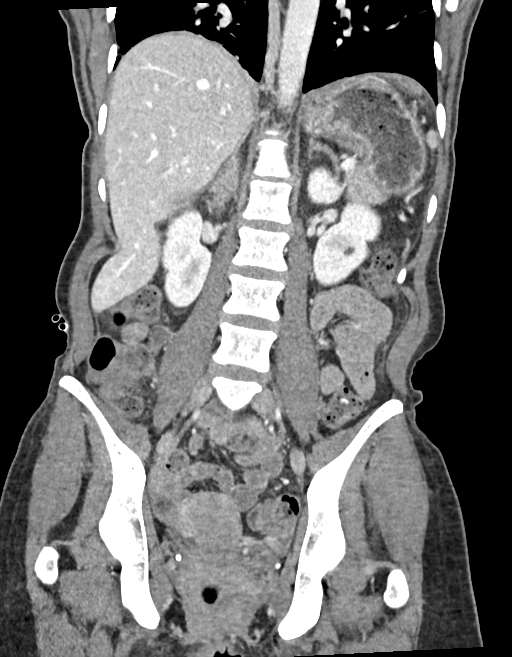

[10 of 46 positions shown; findings below may reference images not displayed]

FINDINGS: Lower chest: Small pleural effusions have resolved. Patchy densities
at the lung bases are most compatible with atelectasis.

Hepatobiliary: Liver parenchyma is heterogeneous and there is a
complex multiloculated collection centered in the caudate lobe. This
collection roughly measures 5.2 x 5.6 cm on sequence 2 image 21 and
previously measured 5.9 x 5.9 cm. Previously, there were multiple
small fluid pockets along the posterior left hepatic lobe and these
pockets have markedly decreased in size or resolved. Subcapsular
collection along the posterior aspect of the left hepatic lobe on
sequence 2 image 32 roughly measures 2.0 x 0.8 cm and previously
measured 3.6 x 1.8 cm. Few tiny low-density structures throughout
the right hepatic lobe could represent cysts but cannot exclude
small foci of infection. Evidence for a calcified gallstone. No
significant gallbladder distension. Portal venous system is patent.

Pancreas: Unremarkable. No pancreatic ductal dilatation or
surrounding inflammatory changes.

Spleen: Normal in size without focal abnormality.

Adrenals/Urinary Tract: Normal appearance of the right adrenal
gland. Mild fullness in the left adrenal gland. Evidence for left
renal cysts. Negative for hydronephrosis. Normal appearance of the
right kidney. Normal appearance of the urinary bladder.

Stomach/Bowel: Colonic diverticula. There appears to be a very large
sigmoid colon diverticulum on the left side of the pelvis on
sequence 2 image 71 measuring up to 3.0 cm. No significant
inflammatory changes in this area. There is another round air-fluid
collection adjacent to the sigmoid colon on sequence 2 image 66,
measuring up to 1.7 cm, which likely represents a pedunculated
diverticulum. There may be mild colonic wall thickening on sequence
2 image 66. Diverticula involving the right colon with mild
pericolonic stranding on sequence 2 image 46. There was mild
pericolonic stranding in this area on the previous CT examination.
No evidence for bowel obstruction. There may be mild wall thickening
near the gastric cardia adjacent to the inflammatory changes in
liver.

Vascular/Lymphatic: No significant vascular findings are present.
Few prominent lymph nodes in the sigmoid colon region on sequence 2
image 69. These lymph nodes could be reactive but nonspecific.

Reproductive: Uterus and bilateral adnexa are unremarkable.

Other: The fluid collection in the anterior abdomen has essentially
resolved since placement of the percutaneous drain. Percutaneous
drain is positioned just below the left hepatic lobe. No significant
ascites. Probable small periumbilical hernia.

Musculoskeletal: Stable sclerosis involving the right ilium and
right side of the sacrum. Findings are unchanged.
IMPRESSION: 1. Anterior abdominal fluid collection has resolved since placement
of the percutaneous drain. Drain is still present.
2. Complex hepatic fluid collections centered in the caudate lobe.
Based on the previous biopsy, this is most compatible with a hepatic
abscess that is decreasing in size. Markedly decreased fluid in the
posterior left hepatic lobe compared to the previous examination.
Heterogeneous areas in the liver are likely related to underlying
hepatic inflammation. Recommend continued follow-up to ensure
resolution of these presumed hepatic abscesses.
3. Cholelithiasis without evidence of gallbladder inflammation.
4. Extensive colonic diverticulosis. Two air-fluid collections along
the proximal sigmoid colon probably represent large colonic
diverticula. There may be component of diverticulitis in the right
colon which has decreased compared to the exam on [DATE].
Hepatic infection could be originating from colonic diverticular
disease. Recommend continued follow-up of this area to ensure that
these pockets of fluid and gas around the sigmoid region are
diverticula rather than small contained abscesses. In addition,
follow-up mild wall thickening in the proximal sigmoid colon region.

## 2020-10-17 MED ORDER — IOPAMIDOL (ISOVUE-300) INJECTION 61%
100.0000 mL | Freq: Once | INTRAVENOUS | Status: AC | PRN
  Administered 2020-10-17: 100 mL via INTRAVENOUS

## 2020-10-17 NOTE — Progress Notes (Signed)
Referring Physician(s): Dr Dennis Bast Dr Levonne Spiller  Chief Complaint: The patient is seen in follow up today s/p 10/03/20-CT guided placement of a 10.2 [place] all purpose drain catheter into the subhepatic fluid collection  History of present illness:  Abdominal pain and sepsis found to have subhepatic fluid collection. Drain placed 10/03/20.  Patient is known to IR service for previous aspiration of perihepatic ascites with Dr. Anselm Pancoast on 09/27/2020 and liver lesion biopsy with Dr. Earleen Newport on 10/01/2020  Here today for CT and evaluation of abscess She sees ID Dr [place] Bali--- was just seen yesterday' still on Levaquin and Flagyl  She feels great Denies fever chills Denies pain OP is minimal if any Flushes 5 cc daily  Has appt with ID 8/25    Past Medical History:  Diagnosis Date   Anemia    GERD (gastroesophageal reflux disease)    IBS (irritable bowel syndrome)    Perirectal abscess    Protein-calorie malnutrition ([place]) 02/29/2016   Tobacco use    UC (ulcerative colitis) ([place])     Past Surgical History:  Procedure Laterality Date   BIOPSY  09/29/2020   Procedure: BIOPSY;  Surgeon: Jackquline Denmark, MD;  Location: [facility];  Service: Endoscopy;;   ESOPHAGOGASTRODUODENOSCOPY (EGD) WITH PROPOFOL N/A 09/29/2020   Procedure: ESOPHAGOGASTRODUODENOSCOPY (EGD) WITH PROPOFOL;  Surgeon: Jackquline Denmark, MD;  Location[facility] ENDOSCOPY;  Service: Endoscopy;  Laterality: N/A;   IRRIGATION AND DEBRIDEMENT ABSCESS N/A 02/24/2016   Procedure: IRRIGATION AND DEBRIDEMENT ABSCESS;  Surgeon: Stark Klein, MD;  Location: [facility];  Service: General;  Laterality: N/A;    Allergies: Bactrim [sulfamethoxazole-trimethoprim], Percocet [oxycodone-acetaminophen], and Valium [diazepam]  Medications: Prior to Admission medications   Medication Sig Start Date End Date Taking? Authorizing Provider  aspirin EC 81 MG tablet Take 1 tablet (81 mg total) by mouth daily with breakfast. 10/05/20 10/05/21  Roxan Hockey, MD  bismuth subsalicylate (PEPTO BISMOL) 262 MG/15ML suspension Take 30 mLs by mouth every 6 (six) hours as needed for indigestion. Patient not taking: Reported on 10/16/2020    [provider]  ibuprofen (ADVIL) 200 MG tablet Take 200 mg by mouth every 6 (six) hours as needed for moderate pain. Patient not taking: Reported on 10/16/2020    [provider]  mesalamine (LIALDA) 1.2 g EC tablet Take 1 tablet (1.2 g total) by mouth in the morning and at bedtime. 07/11/20   Noralyn Pick, NP  Multiple Vitamins-Minerals (WOMENS MULTIVITAMIN PO) Take 1 tablet by mouth daily.    [provider]  ondansetron (ZOFRAN) 4 MG tablet Take 1 tablet (4 mg total) by mouth every 6 (six) hours as needed for nausea. Patient not taking: Reported on 10/16/2020 10/05/20   Roxan Hockey, MD  pantoprazole (PROTONIX) 40 MG tablet Take 1 tablet (40 mg total) by mouth daily. 10/06/20   Roxan Hockey, MD  POTASSIUM PO Take 1 tablet by mouth daily.    [provider]  Vitamin D, Ergocalciferol, (DRISDOL) 1.25 MG (50000 UNIT) CAPS capsule Take 1 capsule (50,000 Units total) by mouth every 7 (seven) days. TAKE ONE CAPSULE BY MOUTH ONCE WEEKLY 07/22/20   Noralyn Pick, NP  VITAMIN E PO Take 1 tablet by mouth daily.    [provider]     Family History  Problem Relation Age of Onset   Diabetes Mother    COPD Father    Leukemia Brother    Colon cancer Neg Hx    Colon polyps Neg Hx  Esophageal cancer Neg Hx    Rectal cancer Neg Hx    Stomach cancer Neg Hx     Social History   Socioeconomic History   Marital status: Single    Spouse name: Not on file   Number of children: Not on file   Years of education: Not on file   Highest education level: Not on file  Occupational History   Not on file  Tobacco Use   Smoking status: Every Day    Packs/day: 0.33    Years: 28.00    Pack years: 9.24    Types: Cigarettes   Smokeless tobacco: Never   Vaping Use   Vaping Use: Never used  Substance and Sexual Activity   Alcohol use: Yes    Alcohol/week: 2.0 standard drinks    Types: 2 Glasses of wine per week   Drug use: No   Sexual activity: Not Currently    Birth control/protection: Injection  Other Topics Concern   Not on file  Social History Narrative   Not on file   Social Determinants of Health   Financial Resource Strain: Not on file  Food Insecurity: Not on file  Transportation Needs: Not on file  Physical Activity: Not on file  Stress: Not on file  Social Connections: Not on file     Vital Signs: LMP  (LMP Unknown)   Physical Exam Skin:    General: Skin is warm.     Comments: Site of drain is C/D/I No bleding No sign of infection CT reviewed by Dr Anselm Pancoast:  subhepatic abscess is resolved Hepatic abscess smaller   Subhepatic abscess drain removed per [facility] Dressing placed    Imaging: No results found.  Labs:  CBC: Recent Labs    09/30/20 0239 10/01/20 0008 10/02/20 0543 10/03/20 0342  WBC 20.0* 18.2* 15.5* 11.6*  HGB 8.1* 7.8* 7.3* 7.6*  HCT 24.2* 24.1* 22.4* 23.1*  PLT 397 497* 450* 485*    COAGS: Recent Labs    09/27/20 0725 10/01/20 0008  INR 1.2 1.2    BMP: Recent Labs    09/30/20 0239 10/01/20 0008 10/02/20 0543 10/03/20 0342  NA 134* 134* 133* 134*  K 4.5 4.3 4.3 4.4  CL 101 101 100 99  CO2 27 27 24 26   GLUCOSE 151* 130* 97 99  BUN 6 <5* <5* <5*  CALCIUM 8.8* 8.7* 8.6* 8.7*  CREATININE 0.76 0.69 0.66 0.75  GFRNONAA >60 >60 >60 >60    LIVER FUNCTION TESTS: Recent Labs    09/30/20 0239 10/01/20 0008 10/02/20 0543 10/03/20 0342  BILITOT 0.5 0.3 0.9 0.5  AST 28 26 34 34  ALT 63* 56* 54* 53*  ALKPHOS 75 76 58 63  PROT 5.9* 6.3* 6.3* 6.4*  ALBUMIN 1.6* 1.7* 1.7* 1.8*    Assessment:  Subhepatic abscess drain placed 10/03/20 in IR CT today revealing subhepatic abscess resolved Hepatic abscess much smaller Subhepatic abscess drain removed in its entirety-- no  complication Keep appt with ID 8/25 Continue antibiotics She leaves here with good understanding of this plan   Signed: Lavonia Drafts, PA-C 10/17/2020, 1:17 PM   Please refer to Dr. Anselm Pancoast attestation of this note for management and plan.

## 2020-10-22 ENCOUNTER — Inpatient Hospital Stay: Payer: Medicaid Other | Admitting: Hematology and Oncology

## 2020-10-24 ENCOUNTER — Ambulatory Visit (INDEPENDENT_AMBULATORY_CARE_PROVIDER_SITE_OTHER): Payer: Medicaid Other | Admitting: Primary Care

## 2020-10-25 ENCOUNTER — Ambulatory Visit (INDEPENDENT_AMBULATORY_CARE_PROVIDER_SITE_OTHER): Payer: Medicaid Other | Admitting: Infectious Diseases

## 2020-10-25 ENCOUNTER — Other Ambulatory Visit: Payer: Self-pay

## 2020-10-25 VITALS — BP 103/68 | HR 100 | Resp 16 | Ht 66.0 in | Wt 121.0 lb

## 2020-10-25 DIAGNOSIS — K651 Peritoneal abscess: Secondary | ICD-10-CM | POA: Diagnosis not present

## 2020-10-25 DIAGNOSIS — Z5181 Encounter for therapeutic drug level monitoring: Secondary | ICD-10-CM

## 2020-10-25 DIAGNOSIS — K750 Abscess of liver: Secondary | ICD-10-CM | POA: Diagnosis not present

## 2020-10-25 MED ORDER — METRONIDAZOLE 500 MG PO TABS: 500.0000 mg | ORAL_TABLET | Freq: Three times a day (TID) | ORAL | 0 refills | Status: DC

## 2020-10-25 MED ORDER — LEVOFLOXACIN 750 MG PO TABS: 750.0000 mg | ORAL_TABLET | Freq: Every day | ORAL | 0 refills | Status: DC

## 2020-10-25 NOTE — Progress Notes (Signed)
[facility]                                                             [place], [place], [place], [address]                                                                  Phn. [phone]; Fax: [phone]                                                                             Date: 10/17/20  Reason for Referral: HFU for Intraabdominal abscess  Assessment Problem List Items Addressed This Visit       Digestive   Liver abscess   Relevant Orders   CBC (Completed)   Comprehensive metabolic panel (Completed)   C-reactive protein   Sedimentation rate (Completed)     Other   Intra-abdominal abscess ([place]) - Primary   Medication monitoring encounter    Hepatic abscesses, r/o necrotic malignancy Extensive colonic diverticulosis/diverticulitis, possible small contained abscesses in the sigmoid Recently diagnosed IBD (more likely Crohn's):  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 Path: Liver parenchyma with necrotizing acute inflammation. The findings are suggestive of an abscess.   Blood cultures 09/26/20 no growth in 2/2 sets TTE 10/01/20 with no vegetations S/p CT guided abdominal drain 8/3, cultures Streptococcus constellatus  Per GI, malignancy is not excluded but colonic malignancy very unlikely with recent negative colonoscopy. Liver lesions also appear more infectious than necrotic malignancy Repeat CT abd/pelvis 8/17  anterior abdominal fluid collection has resolved but Complex hepatic fluid collections centered in the caudate lobe ( decreasing in size to 5.2*5.6). Extensive colonic diverticulosis, component of diverticulitis in the right colon which has decrease.   Plan Continue Levofloxacin and Metronidazole for 2 more weeks, abtx refilled CBC, CMP, ESR and CRP today   Follpow up in 2 weeks for repeat CT abd/pelvis Follow up with GI   All questions and concerns were discussed and addressed. Patient verbalized understanding of the plan. ____________________________________________________________________________________________________________________ HP/Interval Events Here for HFU after recent hospitalization 7/26-8/5 where hospital course was significant for intraabdominal abscess, Liver masses concerning for abscesses vs necrotic malignancy, peritoneal thickening with ascites concerning for infectious peritonitis vs peritoneal carcinomatosis and sigmoidal thickening concerning for neoplasm. She was discharged on PO levaquin and metronidazole given refusal for PICC line and IV abtx.   She is taking the antibiotics as instructed. Denies any side effects with the abtx.She has occasional loose stool, soft which has resolved. Denies nausea, vomiting, abdominal pain. Appetite is good, and she thinks she has gained some weight The drain has not been draining much and there is approx 10-20cc of  serosanguinous fluid in the drainage bulb which is accumulated since last 2 days. Denies fevers, chills, nausea and vomiting. She has a follow up with IR tomorrow for CT abdomen/pelvis and drain evaluation.    10/25/20.  CT abdomen/pelvis 8/17 reviewed where anterior abdominal fluid collection has resolved but Complex hepatic fluid collections centered in the caudate lobe ( decreasing in size to 5.2*5.6). Extensive colonic diverticulosis, component of diverticulitis in the right colon which has decrease. Hepatic infection could be originating from colonic diverticular disease. Recommend continued follow-up of this area to ensure that these pockets of fluid and gas around the sigmoid region are diverticula rather than small contained abscesses.  Her drain was removed by IR on 8/17 given minimal drainage. She is accompanied by her 7 year old daughter. She tells me she is taking  levofloxacin and metronidazole as instructed. Denies any issues with the abtx except some soft stool which is not watery. Denies any nausea, vomiting and abdominal pain. Her appetite is good and she tells me she has gained at least 5 lbs since hospital discharge. She  needs refill for her prescriptions. Discussed CT abdomen/pelvis findings with her and need to continue abtx as her liver abscess has not completely resolved. Antibiotics refilled and follow up in 2 weeks for repeat CT abdomen/pelvis. I also discussed with her to follow up with GI for her Crohns disease and need for additional work up for +/- GI malignancy.   ROS: Constitutional: Negative for fever, chills, activity change, appetite change, fatigue Respiratory: Negative for cough, shortness of breath Cardiovascular: Negative for chest pain, palpitations and leg swelling.  Gastrointestinal: Negative for nausea, vomiting, abdominal pain, diarrhea/constipation, .  Genitourinary: Negative for dysuria, hematuria, flank pain Musculoskeletal: Negative for myalgias, arthralgia, back pain, joint swelling, arthralgias Skin: Negative for rashes, lesions  Neurological: Negative for weakness, dizziness or headache  Past Medical History:  Diagnosis Date   Anemia    GERD (gastroesophageal reflux disease)    IBS (irritable bowel syndrome)    Perirectal abscess    Protein-calorie malnutrition ([place]) 02/29/2016   Tobacco use    UC (ulcerative colitis) ([place])    Past Surgical History:  Procedure Laterality Date   BIOPSY  09/29/2020   Procedure: BIOPSY;  Surgeon: Jackquline Denmark, MD;  Location: [facility];  Service: Endoscopy;;   ESOPHAGOGASTRODUODENOSCOPY (EGD) WITH PROPOFOL N/A 09/29/2020   Procedure: ESOPHAGOGASTRODUODENOSCOPY (EGD) WITH PROPOFOL;  Surgeon: Jackquline Denmark, MD;  Location: [facility] ENDOSCOPY;  Service: Endoscopy;  Laterality: N/A;   IR RADIOLOGIST EVAL & MGMT  10/17/2020   IRRIGATION AND DEBRIDEMENT ABSCESS N/A 02/24/2016   Procedure:  IRRIGATION AND DEBRIDEMENT ABSCESS;  Surgeon: Stark Klein, MD;  Location: [facility];  Service: General;  Laterality: N/A;   Current Outpatient Medications on File Prior to Visit  Medication Sig Dispense Refill   aspirin EC 81 MG tablet Take 1 tablet (81 mg total) by mouth daily with breakfast. 30 tablet 0   bismuth subsalicylate (PEPTO BISMOL) 262 MG/15ML suspension Take 30 mLs by mouth every 6 (six) hours as needed for indigestion. (Patient not taking: Reported on 10/16/2020)     ibuprofen (ADVIL) 200 MG tablet Take 200 mg by mouth every 6 (six) hours as needed for moderate pain. (Patient not taking: Reported on 10/16/2020)     mesalamine (LIALDA) 1.2 g EC tablet Take 1 tablet (1.2 g total) by mouth in the morning and at bedtime. 60 tablet 1   Multiple Vitamins-Minerals (WOMENS MULTIVITAMIN PO) Take 1 tablet by mouth daily.  ondansetron (ZOFRAN) 4 MG tablet Take 1 tablet (4 mg total) by mouth every 6 (six) hours as needed for nausea. (Patient not taking: Reported on 10/16/2020) 20 tablet 0   pantoprazole (PROTONIX) 40 MG tablet Take 1 tablet (40 mg total) by mouth daily. 30 tablet 2   POTASSIUM PO Take 1 tablet by mouth daily.     Vitamin D, Ergocalciferol, (DRISDOL) 1.25 MG (50000 UNIT) CAPS capsule Take 1 capsule (50,000 Units total) by mouth every 7 (seven) days. TAKE ONE CAPSULE BY MOUTH ONCE WEEKLY 12 capsule 0   VITAMIN E PO Take 1 tablet by mouth daily.     No current facility-administered medications on file prior to visit.   Allergies  Allergen Reactions   Bactrim [Sulfamethoxazole-Trimethoprim] Hives    Immediate body wide hives   Percocet [Oxycodone-Acetaminophen] Nausea Only   Valium [Diazepam] Nausea Only   Social History   Socioeconomic History   Marital status: Single    Spouse name: Not on file   Number of children: Not on file   Years of education: Not on file   Highest education level: Not on file  Occupational History   Not on file  Tobacco Use   Smoking status:  Every Day    Packs/day: 0.33    Years: 28.00    Pack years: 9.24    Types: Cigarettes   Smokeless tobacco: Never  Vaping Use   Vaping Use: Never used  Substance and Sexual Activity   Alcohol use: Yes    Alcohol/week: 2.0 standard drinks    Types: 2 Glasses of wine per week   Drug use: No   Sexual activity: Not Currently    Birth control/protection: Injection  Other Topics Concern   Not on file  Social History Narrative   Not on file   Social Determinants of Health   Financial Resource Strain: Not on file  Food Insecurity: Not on file  Transportation Needs: Not on file  Physical Activity: Not on file  Stress: Not on file  Social Connections: Not on file  Intimate Partner Violence: Not on file   Family History  Problem Relation Age of Onset   Diabetes Mother    COPD Father    Leukemia Brother    Colon cancer Neg Hx    Colon polyps Neg Hx    Esophageal cancer Neg Hx    Rectal cancer Neg Hx    Stomach cancer Neg Hx     Vitals BP 103/68   Pulse 100   Resp 16   Ht _0  (1.676 m)   Wt 121 lb (54.9 kg)   LMP  (LMP Unknown)   SpO2 100%   BMI 19.53 kg/m '   Examination  General - not in acute distress, comfortably sitting in chair HEENT - PEERLA, no pallor and no icterus Chest - b/l clear air entry, no additional sounds CVS- Normal s1s2, RRR Abdomen - Soft, Non tender , non distended Ext- no pedal edema Neuro: grossly normal Back - WNL Psych : calm and cooperative   Recent labs CBC Latest Ref Rng & Units 10/03/2020 10/02/2020 10/01/2020  WBC 4.0 - 10.5 K/uL 11.6(H) 15.5(H) 18.2(H)  Hemoglobin 12.0 - 15.0 g/dL 7.6(L) 7.3(L) 7.8(L)  Hematocrit 36.0 - 46.0 % 23.1(L) 22.4(L) 24.1(L)  Platelets 150 - 400 K/uL 485(H) 450(H) 497(H)   CMP Latest Ref Rng & Units 10/03/2020 10/02/2020 10/01/2020  Glucose 70 - 99 mg/dL 99 97 130(H)  BUN 6 - 20 mg/dL <5(L) <5(L) <5(L)  Creatinine 0.44 - 1.00 mg/dL 0.75 0.66 0.69  Sodium 135 - 145 mmol/L 134(L) 133(L) 134(L)  Potassium 3.5  - 5.1 mmol/L 4.4 4.3 4.3  Chloride 98 - 111 mmol/L 99 100 101  CO2 22 - 32 mmol/L _0 Calcium 8.9 - 10.3 mg/dL 8.7(L) 8.6(L) 8.7(L)  Total Protein 6.5 - 8.1 g/dL 6.4(L) 6.3(L) 6.3(L)  Total Bilirubin 0.3 - 1.2 mg/dL 0.5 0.9 0.3  Alkaline Phos 38 - 126 U/L 63 58 76  AST 15 - 41 U/L 34 34 26  ALT 0 - 44 U/L 53(H) 54(H) 56(H)     Pertinent Microbiology Results for orders placed or performed during the hospital encounter of 09/25/20  Urine Culture     Status: Abnormal   Collection Time: 09/25/20 10:01 PM   Specimen: Urine, Clean Catch  Result Value Ref Range Status   Specimen Description URINE, CLEAN CATCH  Final   Special Requests   Final    NONE Performed at [facility], [place] [address], [place], [place] [address]    Culture MULTIPLE SPECIES PRESENT, SUGGEST RECOLLECTION (A)  Final   Report Status 09/27/2020 FINAL  Final  Resp Panel by RT-PCR (Flu A&B, Covid) Nasopharyngeal Swab     Status: None   Collection Time: 09/25/20 10:09 PM   Specimen: Nasopharyngeal Swab; Nasopharyngeal(NP) swabs in vial transport medium  Result Value Ref Range Status   SARS Coronavirus 2 by RT PCR NEGATIVE NEGATIVE Final    Comment: (NOTE) SARS-CoV-2 target nucleic acids are NOT DETECTED.  The SARS-CoV-2 RNA is generally detectable in upper respiratory specimens during the acute phase of infection. The lowest concentration of SARS-CoV-2 viral copies this assay can detect is 138 copies/mL. A negative result does not preclude SARS-Cov-2 infection and should not be used as the sole basis for treatment or other patient management decisions. A negative result may occur with  improper specimen collection/handling, submission of specimen other than nasopharyngeal swab, presence of viral mutation(s) within the areas targeted by this assay, and inadequate number of viral copies(<138 copies/mL). A negative result must be combined with clinical observations, patient history, and  epidemiological information. The expected result is Negative.  Fact Sheet for Patients:  EntrepreneurPulse.com.au  Fact Sheet for Healthcare Providers:  IncredibleEmployment.be  This test is no t yet approved or cleared by the [place] FDA and  has been authorized for detection and/or diagnosis of SARS-CoV-2 by FDA under an Emergency Use Authorization (EUA). This EUA will remain  in effect (meaning this test can be used) for the duration of the COVID-19 declaration under Section 564(b)(1) of the Act, 21 U.S.C.section 360bbb-3(b)(1), unless the authorization is terminated  or revoked sooner.       Influenza A by PCR NEGATIVE NEGATIVE Final   Influenza B by PCR NEGATIVE NEGATIVE Final    Comment: (NOTE) The Xpert Xpress SARS-CoV-2/FLU/RSV plus assay is intended as an aid in the diagnosis of influenza from Nasopharyngeal swab specimens and should not be used as a sole basis for treatment. Nasal washings and aspirates are unacceptable for Xpert Xpress SARS-CoV-2/FLU/RSV testing.  Fact Sheet for Patients: EntrepreneurPulse.com.au  Fact Sheet for Healthcare Providers: IncredibleEmployment.be  This test is not yet approved or cleared by the [place] FDA and has been authorized for detection and/or diagnosis of SARS-CoV-2 by FDA under an Emergency Use Authorization (EUA). This EUA will remain in effect (meaning this test can be used) for the duration of the COVID-19 declaration under Section 564(b)(1) of the Act,  21 U.S.C. section 360bbb-3(b)(1), unless the authorization is terminated or revoked.  Performed at [facility], [place] [address], [place], [place] [address]   Culture, blood (routine x 2)     Status: None   Collection Time: 09/26/20  9:40 PM   Specimen: BLOOD  Result Value Ref Range Status   Specimen Description BLOOD SITE NOT SPECIFIED  Final   Special Requests   Final    BOTTLES  DRAWN AEROBIC AND ANAEROBIC Blood Culture adequate volume   Culture   Final    NO GROWTH 5 DAYS Performed at [facility], [address] [place], [place] [address]    Report Status 10/01/2020 FINAL  Final  Culture, blood (routine x 2)     Status: None   Collection Time: 09/26/20  9:46 PM   Specimen: BLOOD  Result Value Ref Range Status   Specimen Description BLOOD LEFT ANTECUBITAL  Final   Special Requests   Final    BOTTLES DRAWN AEROBIC AND ANAEROBIC Blood Culture adequate volume   Culture   Final    NO GROWTH 5 DAYS Performed at [facility], [place] [address] [place], [place] [address]    Report Status 10/01/2020 FINAL  Final  Body fluid culture w Gram Stain     Status: None   Collection Time: 09/27/20 11:30 AM   Specimen: Peritoneal Washings; Peritoneal Fluid  Result Value Ref Range Status   Specimen Description PERITONEAL FLUID  Final   Special Requests NONE  Final   Gram Stain   Final    ABUNDANT WBC PRESENT, PREDOMINANTLY PMN NO ORGANISMS SEEN    Culture   Final    MODERATE STREPTOCOCCUS GROUP F CRITICAL RESULT CALLED TO, READ BACK BY AND VERIFIED WITH: RN E.BLUE AT 3903 ON 10/01/2020 BY T.SAAD. Performed at [facility], [place] [address], [place], [place] [address]    Report Status 10/01/2020 FINAL  Final   Organism ID, Bacteria STREPTOCOCCUS GROUP F  Final      Susceptibility   Streptococcus group f - MIC*    PENICILLIN INTERMEDIATE Intermediate     CEFTRIAXONE 1 SENSITIVE Sensitive     ERYTHROMYCIN <=0.12 SENSITIVE Sensitive     LEVOFLOXACIN 0.5 SENSITIVE Sensitive     VANCOMYCIN 0.5 SENSITIVE Sensitive     * MODERATE STREPTOCOCCUS GROUP F  Aerobic/Anaerobic Culture w Gram Stain (surgical/deep wound)     Status: None   Collection Time: 10/01/20  9:30 AM   Specimen: Tissue  Result Value Ref Range Status   Specimen Description TISSUE  Final   Special Requests LEFT LIVER MASS  Final   Gram Stain NO WBC SEEN NO ORGANISMS SEEN   Final    Culture   Final    RARE STREPTOCOCCUS GROUP F Beta hemolytic streptococci are predictably susceptible to penicillin and other beta lactams. Susceptibility testing not routinely performed. CRITICAL RESULT CALLED TO, READ BACK BY AND VERIFIED WITH: RN G.REN AT 3007 ON 10/03/2020 BY T.SAAD NO ANAEROBES ISOLATED Performed at [facility], [place] [address] [place],  [address]    Report Status 10/06/2020 FINAL  Final  Aerobic/Anaerobic Culture w Gram Stain (surgical/deep wound)     Status: None   Collection Time: 10/01/20  9:47 AM   Specimen: Abscess  Result Value Ref Range Status   Specimen Description ABSCESS  Final   Special Requests LEFT LIVER  Final   Gram Stain NO WBC SEEN NO ORGANISMS SEEN   Final  Culture   Final    RARE STREPTOCOCCUS GROUP F Beta hemolytic streptococci are predictably susceptible to penicillin and other beta lactams. Susceptibility testing not routinely performed. NO ANAEROBES ISOLATED Performed at [facility], [place] [address] [place], [place] [address]    Report Status 10/06/2020 FINAL  Final  Culture, blood (routine x 2)     Status: None   Collection Time: 10/02/20  5:43 AM   Specimen: BLOOD RIGHT HAND  Result Value Ref Range Status   Specimen Description BLOOD RIGHT HAND  Final   Special Requests   Final    BOTTLES DRAWN AEROBIC ONLY Blood Culture adequate volume   Culture   Final    NO GROWTH 5 DAYS Performed at [facility], [place] [address], [place], [place] [address]    Report Status 10/07/2020 FINAL  Final  Culture, blood (routine x 2)     Status: None   Collection Time: 10/02/20  5:48 AM   Specimen: BLOOD  Result Value Ref Range Status   Specimen Description BLOOD LEFT ANTECUBITAL  Final   Special Requests   Final    BOTTLES DRAWN AEROBIC ONLY Blood Culture adequate volume   Culture   Final    NO GROWTH 5 DAYS Performed at [facility], [place] [address], [place], [place] [address]    Report Status 10/07/2020 FINAL   Final  Aerobic/Anaerobic Culture w Gram Stain (surgical/deep wound)     Status: None   Collection Time: 10/03/20  4:36 PM   Specimen: Abscess  Result Value Ref Range Status   Specimen Description ABSCESS  Final   Special Requests ABDOMEN  Final   Gram Stain   Final    ABUNDANT WBC PRESENT, PREDOMINANTLY PMN NO ORGANISMS SEEN    Culture   Final    RARE STREPTOCOCCUS CONSTELLATUS NO ANAEROBES ISOLATED Performed at [facility], [place] [address], [place],  [address]    Report Status 10/09/2020 FINAL  Final   Organism ID, Bacteria STREPTOCOCCUS CONSTELLATUS  Final      Susceptibility   Streptococcus constellatus - MIC*    PENICILLIN 0.25 INTERMEDIATE Intermediate     CEFTRIAXONE 1 SENSITIVE Sensitive     ERYTHROMYCIN <=0.12 SENSITIVE Sensitive     LEVOFLOXACIN 0.5 SENSITIVE Sensitive     VANCOMYCIN 0.5 SENSITIVE Sensitive     * RARE STREPTOCOCCUS CONSTELLATUS   Pertinent Imaging CT abdpomen/pelvis 10/17/20 FINDINGS: Lower chest: Small pleural effusions have resolved. Patchy densities at the lung bases are most compatible with atelectasis.   Hepatobiliary: Liver parenchyma is heterogeneous and there is a complex multiloculated collection centered in the caudate lobe. This collection roughly measures 5.2 x 5.6 cm on sequence 2 image 21 and previously measured 5.9 x 5.9 cm. Previously, there were multiple small fluid pockets along the posterior left hepatic lobe and these pockets have markedly decreased in size or resolved. Subcapsular collection along the posterior aspect of the left hepatic lobe on sequence 2 image 32 roughly measures 2.0 x 0.8 cm and previously measured 3.6 x 1.8 cm. Few tiny low-density structures throughout the right hepatic lobe could represent cysts but cannot exclude small foci of infection. Evidence for a calcified gallstone. No significant gallbladder distension. Portal venous system is patent.   Pancreas: Unremarkable. No pancreatic ductal  dilatation or surrounding inflammatory changes.   Spleen: Normal in size without focal abnormality.   Adrenals/Urinary Tract: Normal appearance of the right adrenal gland. Mild fullness in the left adrenal gland.  Evidence for left renal cysts. Negative for hydronephrosis. Normal appearance of the right kidney. Normal appearance of the urinary bladder.   Stomach/Bowel: Colonic diverticula. There appears to be a very large sigmoid colon diverticulum on the left side of the pelvis on sequence 2 image 71 measuring up to 3.0 cm. No significant inflammatory changes in this area. There is another round air-fluid collection adjacent to the sigmoid colon on sequence 2 image 66, measuring up to 1.7 cm, which likely represents a pedunculated diverticulum. There may be mild colonic wall thickening on sequence 2 image 66. Diverticula involving the right colon with mild pericolonic stranding on sequence 2 image 46. There was mild pericolonic stranding in this area on the previous CT examination. No evidence for bowel obstruction. There may be mild wall thickening near the gastric cardia adjacent to the inflammatory changes in liver.   Vascular/Lymphatic: No significant vascular findings are present. Few prominent lymph nodes in the sigmoid colon region on sequence 2 image 69. These lymph nodes could be reactive but nonspecific.   Reproductive: Uterus and bilateral adnexa are unremarkable.   Other: The fluid collection in the anterior abdomen has essentially resolved since placement of the percutaneous drain. Percutaneous drain is positioned just below the left hepatic lobe. No significant ascites. Probable small periumbilical hernia.   Musculoskeletal: Stable sclerosis involving the right ilium and right side of the sacrum. Findings are unchanged.   IMPRESSION: 1. Anterior abdominal fluid collection has resolved since placement of the percutaneous drain. Drain is still present. 2. Complex  hepatic fluid collections centered in the caudate lobe. Based on the previous biopsy, this is most compatible with a hepatic abscess that is decreasing in size. Markedly decreased fluid in the posterior left hepatic lobe compared to the previous examination. Heterogeneous areas in the liver are likely related to underlying hepatic inflammation. Recommend continued follow-up to ensure resolution of these presumed hepatic abscesses. 3. Cholelithiasis without evidence of gallbladder inflammation. 4. Extensive colonic diverticulosis. Two air-fluid collections along the proximal sigmoid colon probably represent large colonic diverticula. There may be component of diverticulitis in the right colon which has decreased compared to the exam on 09/30/2020. Hepatic infection could be originating from colonic diverticular disease. Recommend continued follow-up of this area to ensure that these pockets of fluid and gas around the sigmoid region are diverticula rather than small contained abscesses. In addition, follow-up mild wall thickening in the proximal sigmoid colon region.  All pertinent labs/Imagings/notes reviewed. All pertinent plain films and CT images have been personally visualized and interpreted; radiology reports have been reviewed. Decision making incorporated into the Impression / Recommendations.  I have spent a total of 30 minutes of face-to-face and non-face-to-face time, excluding clinical staff time, preparing to see patient, ordering tests and/or medications, and provide counseling the patient    Electronically signed by:  [name], MD Infectious Disease Physician [facility] [address] [place], [place] [address] Phone: [phone]  Fax: [phone]

## 2020-10-26 LAB — CBC
HCT: 33.5 % — ABNORMAL LOW (ref 35.0–45.0)
Hemoglobin: 10.9 g/dL — ABNORMAL LOW (ref 11.7–15.5)
MCH: 27.4 pg (ref 27.0–33.0)
MCHC: 32.5 g/dL (ref 32.0–36.0)
MCV: 84.2 fL (ref 80.0–100.0)
MPV: 8.4 fL (ref 7.5–12.5)
Platelets: 478 10*3/uL — ABNORMAL HIGH (ref 140–400)
RBC: 3.98 10*6/uL (ref 3.80–5.10)
RDW: 14.9 % (ref 11.0–15.0)
WBC: 8.6 10*3/uL (ref 3.8–10.8)

## 2020-10-26 LAB — COMPREHENSIVE METABOLIC PANEL
AG Ratio: 0.9 (calc) — ABNORMAL LOW (ref 1.0–2.5)
ALT: 13 U/L (ref 6–29)
AST: 16 U/L (ref 10–35)
Albumin: 3.4 g/dL — ABNORMAL LOW (ref 3.6–5.1)
Alkaline phosphatase (APISO): 68 U/L (ref 37–153)
BUN: 8 mg/dL (ref 7–25)
CO2: 25 mmol/L (ref 20–32)
Calcium: 8.9 mg/dL (ref 8.6–10.4)
Chloride: 105 mmol/L (ref 98–110)
Creat: 0.64 mg/dL (ref 0.50–1.03)
Globulin: 3.8 g/dL (calc) — ABNORMAL HIGH (ref 1.9–3.7)
Glucose, Bld: 84 mg/dL (ref 65–99)
Potassium: 4.1 mmol/L (ref 3.5–5.3)
Sodium: 139 mmol/L (ref 135–146)
Total Bilirubin: 0.3 mg/dL (ref 0.2–1.2)
Total Protein: 7.2 g/dL (ref 6.1–8.1)

## 2020-10-26 LAB — SEDIMENTATION RATE: Sed Rate: 43 mm/h — ABNORMAL HIGH (ref 0–20)

## 2020-10-26 LAB — C-REACTIVE PROTEIN: CRP: 2.5 mg/L (ref <8.0)

## 2020-11-01 ENCOUNTER — Ambulatory Visit: Payer: Medicaid Other | Admitting: Nurse Practitioner

## 2020-11-02 ENCOUNTER — Telehealth: Payer: Self-pay | Admitting: Hematology and Oncology

## 2020-11-02 NOTE — Telephone Encounter (Signed)
R/s 9/6 appt per patient request. Called and spoke with patient. Confirmed appt

## 2020-11-06 ENCOUNTER — Inpatient Hospital Stay: Payer: Medicaid Other | Admitting: Hematology and Oncology

## 2020-11-12 ENCOUNTER — Ambulatory Visit (INDEPENDENT_AMBULATORY_CARE_PROVIDER_SITE_OTHER): Payer: Medicaid Other | Admitting: Primary Care

## 2020-11-13 ENCOUNTER — Ambulatory Visit: Payer: Medicaid Other | Admitting: Infectious Diseases

## 2020-11-14 ENCOUNTER — Ambulatory Visit: Payer: Medicaid Other | Admitting: Infectious Diseases

## 2020-11-20 ENCOUNTER — Telehealth: Payer: Self-pay

## 2020-11-20 ENCOUNTER — Inpatient Hospital Stay: Payer: Medicaid Other | Attending: Hematology and Oncology | Admitting: Hematology and Oncology

## 2020-11-20 NOTE — Telephone Encounter (Signed)
Attempted to call patient regarding missed appt with Dr Chryl Heck today. No answer, voicemail box full. Scheduling message sent to reschedule patient.

## 2020-11-21 ENCOUNTER — Telehealth: Payer: Self-pay | Admitting: Hematology and Oncology

## 2020-11-21 NOTE — Telephone Encounter (Signed)
Scheduled per sch msg. Called and left msg  

## 2020-11-22 ENCOUNTER — Ambulatory Visit (INDEPENDENT_AMBULATORY_CARE_PROVIDER_SITE_OTHER): Payer: Medicaid Other | Admitting: Infectious Diseases

## 2020-11-22 ENCOUNTER — Other Ambulatory Visit: Payer: Self-pay

## 2020-11-22 ENCOUNTER — Encounter: Payer: Self-pay | Admitting: Infectious Diseases

## 2020-11-22 VITALS — BP 112/77 | HR 109 | Temp 97.3°F | Wt 127.0 lb

## 2020-11-22 DIAGNOSIS — Z5181 Encounter for therapeutic drug level monitoring: Secondary | ICD-10-CM | POA: Diagnosis not present

## 2020-11-22 DIAGNOSIS — K750 Abscess of liver: Secondary | ICD-10-CM

## 2020-11-22 DIAGNOSIS — F17200 Nicotine dependence, unspecified, uncomplicated: Secondary | ICD-10-CM | POA: Diagnosis not present

## 2020-11-22 NOTE — Progress Notes (Addendum)
[facility]                                                             [place], [place], [place], [address]                                                                  Phn. [phone]; Fax: [phone]                                                                             Date: 11/22/20  Reason for follow up- Hepatic abscess  Assessment Hepatic abscesses, r/o necrotic malignancy Extensive colonic diverticulosis/diverticulitis, possible small contained abscesses in the sigmoid, wall thickening in the proximal sigmoid colon Recently diagnosed IBD (more likely Crohn's):  Mesalamine. Follow up with GI  Sacroiliitis on CT abdomen pelvis   Paracentesis 7/28 Streptococcus F , no malignant cells  Liver biopsy 8/1 Streptococcus group F. Path: Liver parenchyma with necrotizing acute inflammation. The findings are suggestive of an abscess.   Blood cultures 09/26/20 no growth in 2/2 sets TTE 10/01/20 with no vegetations S/p CT guided abdominal drain 8/3, cultures Streptococcus constellatus  Per GI, malignancy is not excluded but colonic malignancy very unlikely with recent negative colonoscopy. Liver lesions also appear more infectious than necrotic malignancy Repeat CT abd/pelvis 8/17  anterior abdominal fluid collection has resolved but Complex hepatic fluid collections centered in the caudate lobe ( decreasing in size to 5.2*5.6). Extensive colonic diverticulosis, component of diverticulitis in the right colon which has decrease.   Plan Continue Levofloxacin and Metronidazole for 2 more weeks, she tells me she has enough pills at home and does not need refills currently CBC, CMP, ESR and CRP today  CT abdomen/pelvis today  Follow up in 10-14 days after CT is done.  Follow up with GI as instructed   All questions and concerns were discussed and addressed. Patient verbalized  understanding of the plan. ____________________________________________________________________________________________________________________ HP/Interval Events Here for follow up in the setting of hepatic abscess. She has missed 2 appointments earlier and tells me that she canceled the appointment thinking its another appointment she meant to cancel. However, she has been taking her PO levofloxacin and PO metronidazole as instructed. She denies missing any doses. Denies any fevers, chills and sweats. Denies nausea, vomiting, abdominal pain and diarrhea. Appetite is good. Denies any weight changes. I discussed with her that she still has hepatic abscess in her last CT and would need to have a fu CT abdomen/pelvis to make sure it is resolved before stopping abtx.   ROS: Constitutional: Negative for fever, chills, activity change, appetite change, fatigue Respiratory: Negative for cough, shortness of breath Cardiovascular: Negative for chest pain, palpitations and leg swelling.  Gastrointestinal: Negative for nausea, vomiting, abdominal  pain, diarrhea/constipation, .  Genitourinary: Negative for dysuria, hematuria, flank pain Musculoskeletal: Negative for myalgias, arthralgia, back pain, joint swelling, arthralgias Skin: Negative for rashes, lesions  Neurological: Negative for weakness, dizziness or headache  Past Medical History:  Diagnosis Date   Anemia    GERD (gastroesophageal reflux disease)    IBS (irritable bowel syndrome)    Perirectal abscess    Protein-calorie malnutrition ([place]) 02/29/2016   Tobacco use    UC (ulcerative colitis) ([place])    Past Surgical History:  Procedure Laterality Date   BIOPSY  09/29/2020   Procedure: BIOPSY;  Surgeon: Jackquline Denmark, MD;  Location: [facility];  Service: Endoscopy;;   ESOPHAGOGASTRODUODENOSCOPY (EGD) WITH PROPOFOL N/A 09/29/2020   Procedure: ESOPHAGOGASTRODUODENOSCOPY (EGD) WITH PROPOFOL;  Surgeon: Jackquline Denmark, MD;  Location: [facility] ENDOSCOPY;   Service: Endoscopy;  Laterality: N/A;   IR RADIOLOGIST EVAL & MGMT  10/17/2020   IRRIGATION AND DEBRIDEMENT ABSCESS N/A 02/24/2016   Procedure: IRRIGATION AND DEBRIDEMENT ABSCESS;  Surgeon: Stark Klein, MD;  Location: [facility];  Service: General;  Laterality: N/A;   Current Outpatient Medications on File Prior to Visit  Medication Sig Dispense Refill   aspirin EC 81 MG tablet Take 1 tablet (81 mg total) by mouth daily with breakfast. 30 tablet 0   bismuth subsalicylate (PEPTO BISMOL) 262 MG/15ML suspension Take 30 mLs by mouth every 6 (six) hours as needed for indigestion.     ibuprofen (ADVIL) 200 MG tablet Take 200 mg by mouth every 6 (six) hours as needed for moderate pain.     levofloxacin (LEVAQUIN) 750 MG tablet Take 1 tablet (750 mg total) by mouth daily. 20 tablet 0   mesalamine (LIALDA) 1.2 g EC tablet Take 1 tablet (1.2 g total) by mouth in the morning and at bedtime. 60 tablet 1   metroNIDAZOLE (FLAGYL) 500 MG tablet Take 1 tablet (500 mg total) by mouth 3 (three) times daily. 40 tablet 0   Multiple Vitamins-Minerals (WOMENS MULTIVITAMIN PO) Take 1 tablet by mouth daily.     ondansetron (ZOFRAN) 4 MG tablet Take 1 tablet (4 mg total) by mouth every 6 (six) hours as needed for nausea. 20 tablet 0   pantoprazole (PROTONIX) 40 MG tablet Take 1 tablet (40 mg total) by mouth daily. 30 tablet 2   POTASSIUM PO Take 1 tablet by mouth daily.     Vitamin D, Ergocalciferol, (DRISDOL) 1.25 MG (50000 UNIT) CAPS capsule Take 1 capsule (50,000 Units total) by mouth every 7 (seven) days. TAKE ONE CAPSULE BY MOUTH ONCE WEEKLY 12 capsule 0   VITAMIN E PO Take 1 tablet by mouth daily.     No current facility-administered medications on file prior to visit.   Allergies  Allergen Reactions   Bactrim [Sulfamethoxazole-Trimethoprim] Hives    Immediate body wide hives   Percocet [Oxycodone-Acetaminophen] Nausea Only   Valium [Diazepam] Nausea Only   Social History   Socioeconomic History   Marital  status: Single    Spouse name: Not on file   Number of children: Not on file   Years of education: Not on file   Highest education level: Not on file  Occupational History   Not on file  Tobacco Use   Smoking status: Every Day    Packs/day: 0.33    Years: 28.00    Pack years: 9.24    Types: Cigarettes   Smokeless tobacco: Never  Vaping Use   Vaping Use: Never used  Substance and Sexual Activity   Alcohol use: Yes  Alcohol/week: 2.0 standard drinks    Types: 2 Glasses of wine per week   Drug use: No   Sexual activity: Not Currently    Birth control/protection: Injection  Other Topics Concern   Not on file  Social History Narrative   Not on file   Social Determinants of Health   Financial Resource Strain: Not on file  Food Insecurity: Not on file  Transportation Needs: Not on file  Physical Activity: Not on file  Stress: Not on file  Social Connections: Not on file  Intimate Partner Violence: Not on file   Family History  Problem Relation Age of Onset   Diabetes Mother    COPD Father    Leukemia Brother    Colon cancer Neg Hx    Colon polyps Neg Hx    Esophageal cancer Neg Hx    Rectal cancer Neg Hx    Stomach cancer Neg Hx     Vitals BP 112/77   Pulse (!) 109   Temp (!) 97.3 F (36.3 C) (Oral)   Wt 127 lb (57.6 kg)   BMI 20.50 kg/m '   Examination  General - not in acute distress, comfortably sitting in chair HEENT - no pallor and no icterus Chest - b/l clear air entry, no additional sounds CVS- Normal s1s2, RRR Abdomen - Soft, Non tender , non distended Ext- no pedal edema Neuro: grossly normal Back - WNL Psych : calm and cooperative   Recent labs CBC Latest Ref Rng & Units 10/25/2020 10/03/2020 10/02/2020  WBC 3.8 - 10.8 Thousand/uL 8.6 11.6(H) 15.5(H)  Hemoglobin 11.7 - 15.5 g/dL 10.9(L) 7.6(L) 7.3(L)  Hematocrit 35.0 - 45.0 % 33.5(L) 23.1(L) 22.4(L)  Platelets 140 - 400 Thousand/uL 478(H) 485(H) 450(H)   CMP Latest Ref Rng & Units  10/25/2020 10/03/2020 10/02/2020  Glucose 65 - 99 mg/dL 84 99 97  BUN 7 - 25 mg/dL 8 <5(L) <5(L)  Creatinine 0.50 - 1.03 mg/dL 0.64 0.75 0.66  Sodium 135 - 146 mmol/L 139 134(L) 133(L)  Potassium 3.5 - 5.3 mmol/L 4.1 4.4 4.3  Chloride 98 - 110 mmol/L 105 99 100  CO2 20 - 32 mmol/L _0 Calcium 8.6 - 10.4 mg/dL 8.9 8.7(L) 8.6(L)  Total Protein 6.1 - 8.1 g/dL 7.2 6.4(L) 6.3(L)  Total Bilirubin 0.2 - 1.2 mg/dL 0.3 0.5 0.9  Alkaline Phos 38 - 126 U/L - 63 58  AST 10 - 35 U/L 16 34 34  ALT 6 - 29 U/L 13 53(H) 54(H)     Pertinent Microbiology Results for orders placed or performed during the hospital encounter of 09/25/20  Urine Culture     Status: Abnormal   Collection Time: 09/25/20 10:01 PM   Specimen: Urine, Clean Catch  Result Value Ref Range Status   Specimen Description URINE, CLEAN CATCH  Final   Special Requests   Final    NONE Performed at [facility], [place] [address], [place], [place] [address]    Culture MULTIPLE SPECIES PRESENT, SUGGEST RECOLLECTION (A)  Final   Report Status 09/27/2020 FINAL  Final  Resp Panel by RT-PCR (Flu A&B, Covid) Nasopharyngeal Swab     Status: None   Collection Time: 09/25/20 10:09 PM   Specimen: Nasopharyngeal Swab; Nasopharyngeal(NP) swabs in vial transport medium  Result Value Ref Range Status   SARS Coronavirus 2 by RT PCR NEGATIVE NEGATIVE Final    Comment: (NOTE) SARS-CoV-2 target nucleic acids are NOT DETECTED.  The SARS-CoV-2 RNA is generally detectable in  upper respiratory specimens during the acute phase of infection. The lowest concentration of SARS-CoV-2 viral copies this assay can detect is 138 copies/mL. A negative result does not preclude SARS-Cov-2 infection and should not be used as the sole basis for treatment or other patient management decisions. A negative result may occur with  improper specimen collection/handling, submission of specimen other than nasopharyngeal swab, presence of viral mutation(s) within  the areas targeted by this assay, and inadequate number of viral copies(<138 copies/mL). A negative result must be combined with clinical observations, patient history, and epidemiological information. The expected result is Negative.  Fact Sheet for Patients:  EntrepreneurPulse.com.au  Fact Sheet for Healthcare Providers:  IncredibleEmployment.be  This test is no t yet approved or cleared by the [place] FDA and  has been authorized for detection and/or diagnosis of SARS-CoV-2 by FDA under an Emergency Use Authorization (EUA). This EUA will remain  in effect (meaning this test can be used) for the duration of the COVID-19 declaration under Section 564(b)(1) of the Act, 21 U.S.C.section 360bbb-3(b)(1), unless the authorization is terminated  or revoked sooner.       Influenza A by PCR NEGATIVE NEGATIVE Final   Influenza B by PCR NEGATIVE NEGATIVE Final    Comment: (NOTE) The Xpert Xpress SARS-CoV-2/FLU/RSV plus assay is intended as an aid in the diagnosis of influenza from Nasopharyngeal swab specimens and should not be used as a sole basis for treatment. Nasal washings and aspirates are unacceptable for Xpert Xpress SARS-CoV-2/FLU/RSV testing.  Fact Sheet for Patients: EntrepreneurPulse.com.au  Fact Sheet for Healthcare Providers: IncredibleEmployment.be  This test is not yet approved or cleared by the [place] FDA and has been authorized for detection and/or diagnosis of SARS-CoV-2 by FDA under an Emergency Use Authorization (EUA). This EUA will remain in effect (meaning this test can be used) for the duration of the COVID-19 declaration under Section 564(b)(1) of the Act, 21 U.S.C. section 360bbb-3(b)(1), unless the authorization is terminated or revoked.  Performed at [facility], [place] [address] [place], [place] [address]   Culture, blood (routine x 2)     Status: None    Collection Time: 09/26/20  9:40 PM   Specimen: BLOOD  Result Value Ref Range Status   Specimen Description BLOOD SITE NOT SPECIFIED  Final   Special Requests   Final    BOTTLES DRAWN AEROBIC AND ANAEROBIC Blood Culture adequate volume   Culture   Final    NO GROWTH 5 DAYS Performed at [facility], [address], [place], [place] [address]    Report Status 10/01/2020 FINAL  Final  Culture, blood (routine x 2)     Status: None   Collection Time: 09/26/20  9:46 PM   Specimen: BLOOD  Result Value Ref Range Status   Specimen Description BLOOD LEFT ANTECUBITAL  Final   Special Requests   Final    BOTTLES DRAWN AEROBIC AND ANAEROBIC Blood Culture adequate volume   Culture   Final    NO GROWTH 5 DAYS Performed at [facility], [place] [address], [place], [place] [address]    Report Status 10/01/2020 FINAL  Final  Body fluid culture w Gram Stain     Status: None   Collection Time: 09/27/20 11:30 AM   Specimen: Peritoneal Washings; Peritoneal Fluid  Result Value Ref Range Status   Specimen Description PERITONEAL FLUID  Final   Special Requests NONE  Final   Gram Stain   Final  ABUNDANT WBC PRESENT, PREDOMINANTLY PMN NO ORGANISMS SEEN    Culture   Final    MODERATE STREPTOCOCCUS GROUP F CRITICAL RESULT CALLED TO, READ BACK BY AND VERIFIED WITH: RN E.BLUE AT 5056 ON 10/01/2020 BY T.SAAD. Performed at [facility], [place] [address], [place], [place] [address]    Report Status 10/01/2020 FINAL  Final   Organism ID, Bacteria STREPTOCOCCUS GROUP F  Final      Susceptibility   Streptococcus group f - MIC*    PENICILLIN INTERMEDIATE Intermediate     CEFTRIAXONE 1 SENSITIVE Sensitive     ERYTHROMYCIN <=0.12 SENSITIVE Sensitive     LEVOFLOXACIN 0.5 SENSITIVE Sensitive     VANCOMYCIN 0.5 SENSITIVE Sensitive     * MODERATE STREPTOCOCCUS GROUP F  Aerobic/Anaerobic Culture w Gram Stain (surgical/deep wound)     Status: None   Collection Time: 10/01/20  9:30 AM   Specimen:  Tissue  Result Value Ref Range Status   Specimen Description TISSUE  Final   Special Requests LEFT LIVER MASS  Final   Gram Stain NO WBC SEEN NO ORGANISMS SEEN   Final   Culture   Final    RARE STREPTOCOCCUS GROUP F Beta hemolytic streptococci are predictably susceptible to penicillin and other beta lactams. Susceptibility testing not routinely performed. CRITICAL RESULT CALLED TO, READ BACK BY AND VERIFIED WITH: RN G.REN AT 0165 ON 10/03/2020 BY T.SAAD NO ANAEROBES ISOLATED Performed at [facility], [place] [address] [place], [place] [address]    Report Status 10/06/2020 FINAL  Final  Aerobic/Anaerobic Culture w Gram Stain (surgical/deep wound)     Status: None   Collection Time: 10/01/20  9:47 AM   Specimen: Abscess  Result Value Ref Range Status   Specimen Description ABSCESS  Final   Special Requests LEFT LIVER  Final   Gram Stain NO WBC SEEN NO ORGANISMS SEEN   Final   Culture   Final    RARE STREPTOCOCCUS GROUP F Beta hemolytic streptococci are predictably susceptible to penicillin and other beta lactams. Susceptibility testing not routinely performed. NO ANAEROBES ISOLATED Performed at [facility], [place] [address], [place], [place] [address]    Report Status 10/06/2020 FINAL  Final  Culture, blood (routine x 2)     Status: None   Collection Time: 10/02/20  5:43 AM   Specimen: BLOOD RIGHT HAND  Result Value Ref Range Status   Specimen Description BLOOD RIGHT HAND  Final   Special Requests   Final    BOTTLES DRAWN AEROBIC ONLY Blood Culture adequate volume   Culture   Final    NO GROWTH 5 DAYS Performed at [facility], [place] [address] [place], [place] [address]    Report Status 10/07/2020 FINAL  Final  Culture, blood (routine x 2)     Status: None   Collection Time: 10/02/20  5:48 AM   Specimen: BLOOD  Result Value Ref Range Status   Specimen Description BLOOD LEFT ANTECUBITAL  Final   Special Requests   Final    BOTTLES DRAWN AEROBIC ONLY Blood  Culture adequate volume   Culture   Final    NO GROWTH 5 DAYS Performed at [facility], [place] [address] [place], [place] [address]    Report Status 10/07/2020 FINAL  Final  Aerobic/Anaerobic Culture w Gram Stain (surgical/deep wound)     Status: None   Collection Time: 10/03/20  4:36 PM   Specimen: Abscess  Result Value Ref Range  Status   Specimen Description ABSCESS  Final   Special Requests ABDOMEN  Final   Gram Stain   Final    ABUNDANT WBC PRESENT, PREDOMINANTLY PMN NO ORGANISMS SEEN    Culture   Final    RARE STREPTOCOCCUS CONSTELLATUS NO ANAEROBES ISOLATED Performed at [facility], [address], [place], [place] [address]    Report Status 10/09/2020 FINAL  Final   Organism ID, Bacteria STREPTOCOCCUS CONSTELLATUS  Final      Susceptibility   Streptococcus constellatus - MIC*    PENICILLIN 0.25 INTERMEDIATE Intermediate     CEFTRIAXONE 1 SENSITIVE Sensitive     ERYTHROMYCIN <=0.12 SENSITIVE Sensitive     LEVOFLOXACIN 0.5 SENSITIVE Sensitive     VANCOMYCIN 0.5 SENSITIVE Sensitive     * RARE STREPTOCOCCUS CONSTELLATUS   Pertinent Imaging CT abdpomen/pelvis 10/17/20 FINDINGS: Lower chest: Small pleural effusions have resolved. Patchy densities at the lung bases are most compatible with atelectasis.   Hepatobiliary: Liver parenchyma is heterogeneous and there is a complex multiloculated collection centered in the caudate lobe. This collection roughly measures 5.2 x 5.6 cm on sequence 2 image 21 and previously measured 5.9 x 5.9 cm. Previously, there were multiple small fluid pockets along the posterior left hepatic lobe and these pockets have markedly decreased in size or resolved. Subcapsular collection along the posterior aspect of the left hepatic lobe on sequence 2 image 32 roughly measures 2.0 x 0.8 cm and previously measured 3.6 x 1.8 cm. Few tiny low-density structures throughout the right hepatic lobe could represent cysts but cannot  exclude small foci of infection. Evidence for a calcified gallstone. No significant gallbladder distension. Portal venous system is patent.   Pancreas: Unremarkable. No pancreatic ductal dilatation or surrounding inflammatory changes.   Spleen: Normal in size without focal abnormality.   Adrenals/Urinary Tract: Normal appearance of the right adrenal gland. Mild fullness in the left adrenal gland. Evidence for left renal cysts. Negative for hydronephrosis. Normal appearance of the right kidney. Normal appearance of the urinary bladder.   Stomach/Bowel: Colonic diverticula. There appears to be a very large sigmoid colon diverticulum on the left side of the pelvis on sequence 2 image 71 measuring up to 3.0 cm. No significant inflammatory changes in this area. There is another round air-fluid collection adjacent to the sigmoid colon on sequence 2 image 66, measuring up to 1.7 cm, which likely represents a pedunculated diverticulum. There may be mild colonic wall thickening on sequence 2 image 66. Diverticula involving the right colon with mild pericolonic stranding on sequence 2 image 46. There was mild pericolonic stranding in this area on the previous CT examination. No evidence for bowel obstruction. There may be mild wall thickening near the gastric cardia adjacent to the inflammatory changes in liver.   Vascular/Lymphatic: No significant vascular findings are present. Few prominent lymph nodes in the sigmoid colon region on sequence 2 image 69. These lymph nodes could be reactive but nonspecific.   Reproductive: Uterus and bilateral adnexa are unremarkable.   Other: The fluid collection in the anterior abdomen has essentially resolved since placement of the percutaneous drain. Percutaneous drain is positioned just below the left hepatic lobe. No significant ascites. Probable small periumbilical hernia.   Musculoskeletal: Stable sclerosis involving the right ilium and right  side of the sacrum. Findings are unchanged.   IMPRESSION: 1. Anterior abdominal fluid collection has resolved since placement of the percutaneous drain. Drain is still present. 2. Complex hepatic fluid collections centered in the caudate  lobe. Based on the previous biopsy, this is most compatible with a hepatic abscess that is decreasing in size. Markedly decreased fluid in the posterior left hepatic lobe compared to the previous examination. Heterogeneous areas in the liver are likely related to underlying hepatic inflammation. Recommend continued follow-up to ensure resolution of these presumed hepatic abscesses. 3. Cholelithiasis without evidence of gallbladder inflammation. 4. Extensive colonic diverticulosis. Two air-fluid collections along the proximal sigmoid colon probably represent large colonic diverticula. There may be component of diverticulitis in the right colon which has decreased compared to the exam on 09/30/2020. Hepatic infection could be originating from colonic diverticular disease. Recommend continued follow-up of this area to ensure that these pockets of fluid and gas around the sigmoid region are diverticula rather than small contained abscesses. In addition, follow-up mild wall thickening in the proximal sigmoid colon region.  All pertinent labs/Imagings/notes reviewed. All pertinent plain films and CT images have been personally visualized and interpreted; radiology reports have been reviewed. Decision making incorporated into the Impression / Recommendations.  I have spent a total of 35 minutes of face-to-face and non-face-to-face time, excluding clinical staff time, preparing to see patient, ordering tests and/or medications, and provide counseling the patient    Electronically signed by:  [name], MD Infectious Disease Physician [facility] [address] [place], [place] [address] Phone:  [phone]  Fax: [phone]

## 2020-11-23 LAB — CBC
HCT: 38.7 % (ref 35.0–45.0)
Hemoglobin: 12.4 g/dL (ref 11.7–15.5)
MCH: 27.7 pg (ref 27.0–33.0)
MCHC: 32 g/dL (ref 32.0–36.0)
MCV: 86.6 fL (ref 80.0–100.0)
MPV: 9.1 fL (ref 7.5–12.5)
Platelets: 419 10*3/uL — ABNORMAL HIGH (ref 140–400)
RBC: 4.47 10*6/uL (ref 3.80–5.10)
RDW: 13.7 % (ref 11.0–15.0)
WBC: 7.8 10*3/uL (ref 3.8–10.8)

## 2020-11-23 LAB — SEDIMENTATION RATE: Sed Rate: 41 mm/h — ABNORMAL HIGH (ref 0–20)

## 2020-11-23 LAB — C-REACTIVE PROTEIN: CRP: 8 mg/L — ABNORMAL HIGH (ref <8.0)

## 2020-11-27 ENCOUNTER — Inpatient Hospital Stay: Payer: Medicaid Other | Admitting: Hematology and Oncology

## 2020-11-27 NOTE — Assessment & Plan Note (Deleted)
This is a very pleasant 50 year old female patient with no significant past medical history who recently went for screening colonoscopy and was found to have inflammatory bowel disease on findings, also noticed to have some iron deficiency anemia referred to hematology.  Patient comes today to the appointment by herself.   She denies any new health complaints.  She absolutely has no abdominal complaints, fatigue or shortness of breath on exertion from iron deficiency. Physical examination today unremarkable, no lymphadenopathy or hepatosplenomegaly. I reviewed labs which showed ferritin of 10, hemoglobin of 10.6 consistent with mild anemia. She absolutely denies any health complaints today.   We have discussed about common cause of iron deficiency which is likely chronic blood loss versus malabsorption versus hemolysis.  In this patient, this is likely a result of chronic blood loss. Have recommended that she start oral iron supplementation ferrous sulfate 65 mg / 325 mg twice a day along with a stool softener daily and return to clinic in 8 to 10 weeks for follow-up with repeat labs.  We have discussed that if she is unable to improve her iron despite oral iron supplementation, we can certainly consider intravenous iron, I have high threshold to use intravenous iron in this patient because she is quite asymptomatic. She is agreeable to these recommendations.  I have written the name of the iron supplement and a stool softener and given it to her.

## 2020-11-29 ENCOUNTER — Ambulatory Visit (HOSPITAL_COMMUNITY)
Admission: RE | Admit: 2020-11-29 | Discharge: 2020-11-29 | Disposition: A | Discharge status: Home / Self Care | Payer: Medicaid Other | Source: Ambulatory Visit | Attending: Infectious Diseases | Admitting: Infectious Diseases

## 2020-11-29 ENCOUNTER — Encounter (HOSPITAL_COMMUNITY): Payer: Self-pay

## 2020-11-29 ENCOUNTER — Other Ambulatory Visit: Payer: Self-pay

## 2020-11-29 DIAGNOSIS — K750 Abscess of liver: Secondary | ICD-10-CM | POA: Diagnosis present

## 2020-11-29 LAB — POCT I-STAT CREATININE: Creatinine, Ser: 0.9 mg/dL (ref 0.44–1.00)

## 2020-11-29 IMAGING — CT CT ABD-PELV W/ CM
2 of 5 series · 15 of 46 positions shown, 17 images · IV contrast (APPLIED)
Comparison: Prior CT abdomen/pelvis [DATE]

CLINICAL DATA: Hepatic abscess

EXAM:
CT ABDOMEN AND PELVIS WITH CONTRAST
TECHNIQUE: Multidetector CT imaging of the abdomen and pelvis was performed
using the standard protocol following bolus administration of
intravenous contrast.
CONTRAST:  80mL OMNIPAQUE IOHEXOL 350 MG/ML SOLN

[Series 2: axial st · axial · 0.66mm/px · z∈[-538,-168]mm · 12 of 88 slices shown, 14 images]
[im 7/88  soft-tissue]
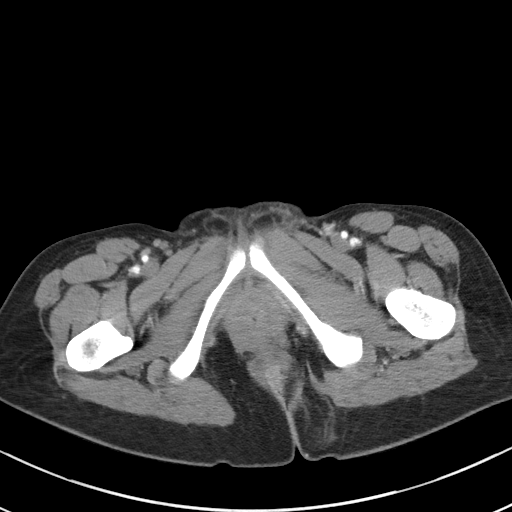
[im 7/88  bone]
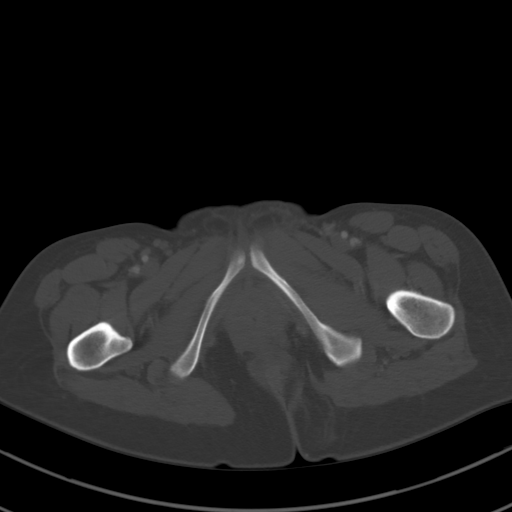
[im 14/88  soft-tissue]
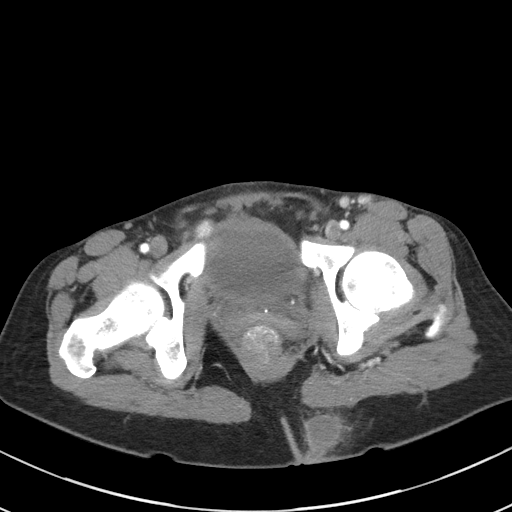
[im 21/88  soft-tissue]
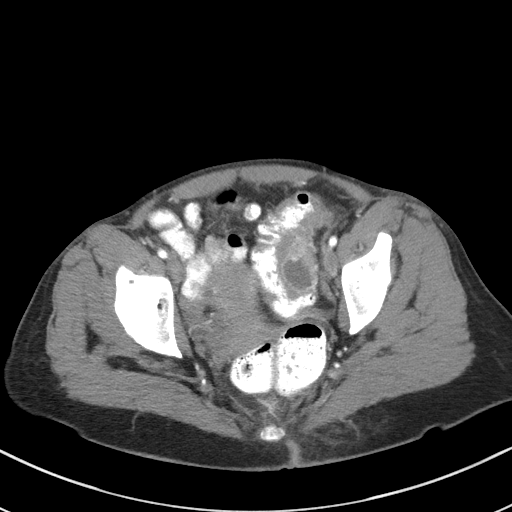
[im 27/88  soft-tissue]
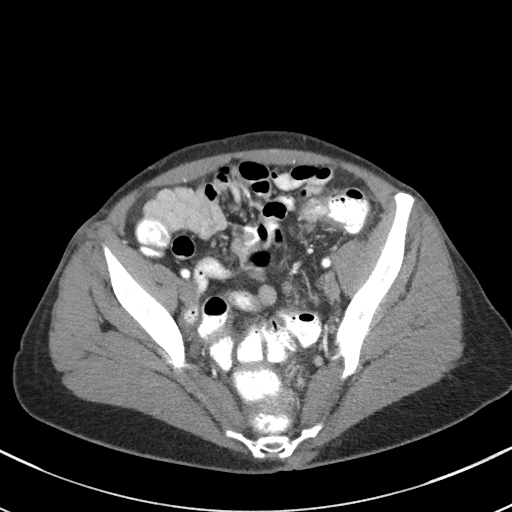
[im 34/88  soft-tissue]
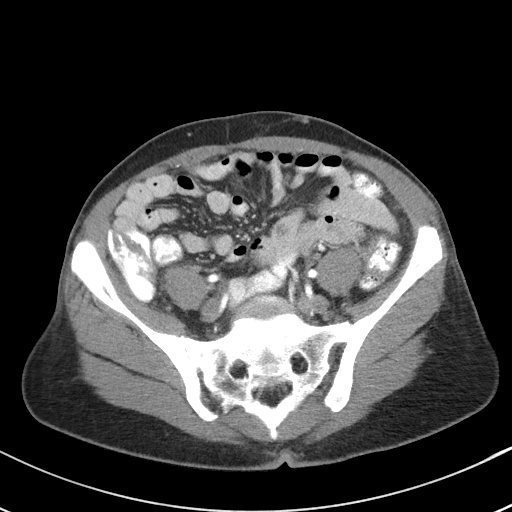
[im 41/88  soft-tissue]
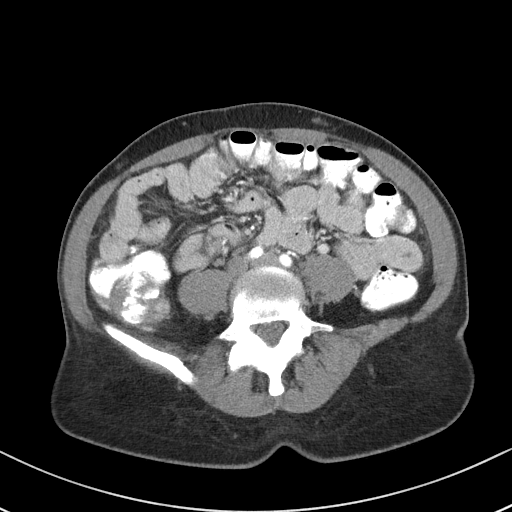
[im 47/88  soft-tissue]
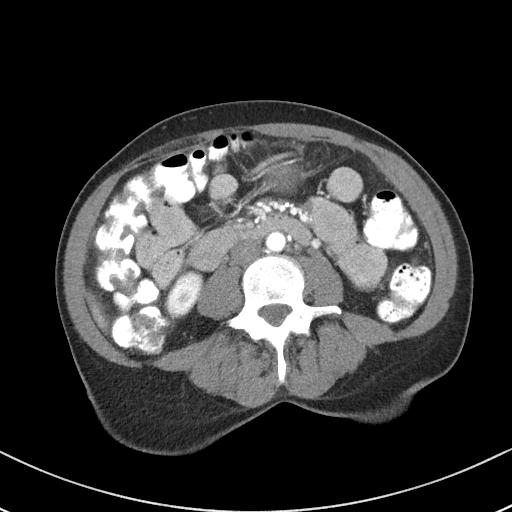
[im 54/88  soft-tissue]
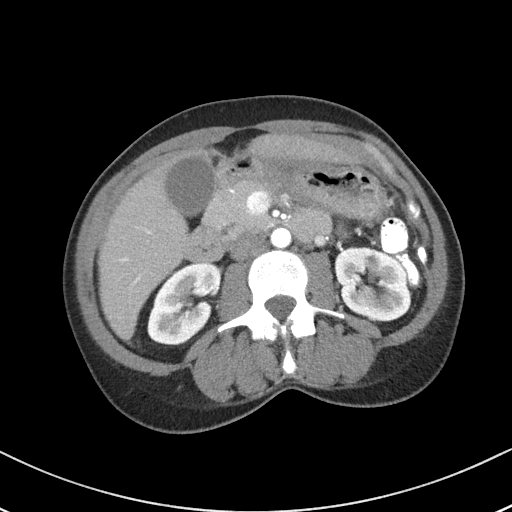
[im 61/88  soft-tissue]
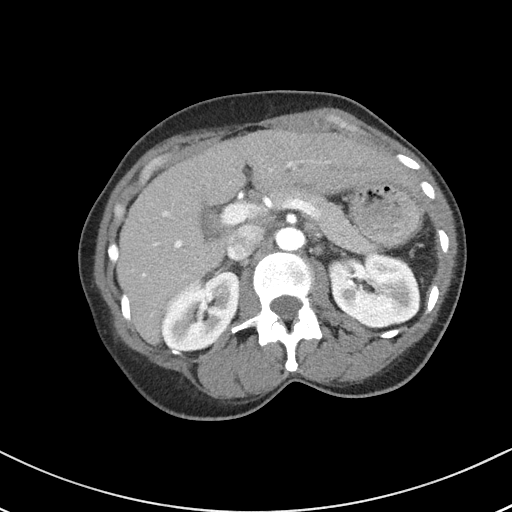
[im 61/88  bone]
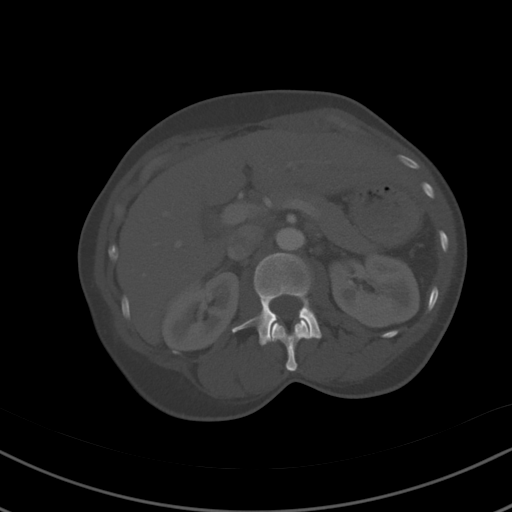
[im 67/88  soft-tissue]
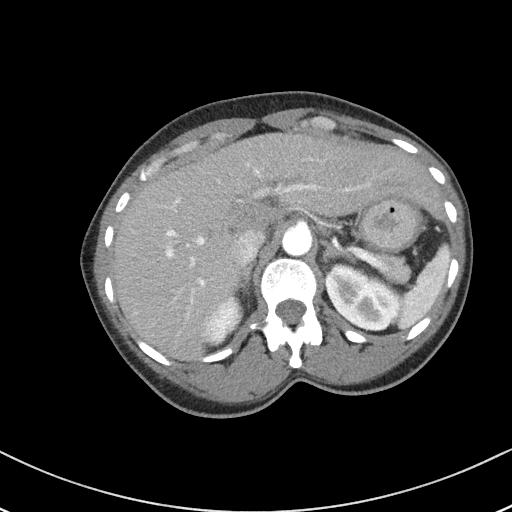
[im 74/88  soft-tissue]
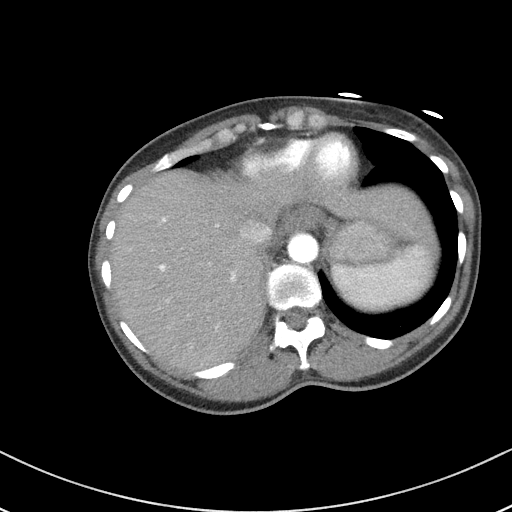
[im 81/88  soft-tissue]
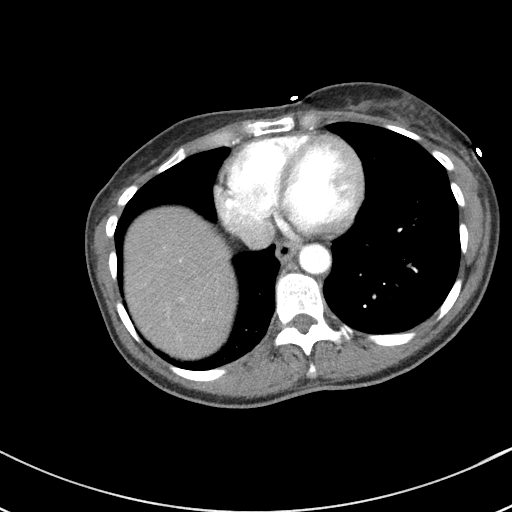

[Series 6: coronal st · coronal · 0.60mm/px · 3 of 101 slices shown]
[im 34/101  soft-tissue]
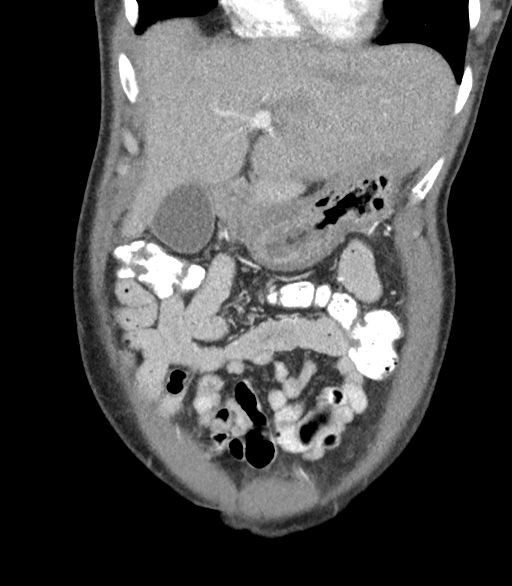
[im 45/101  soft-tissue]
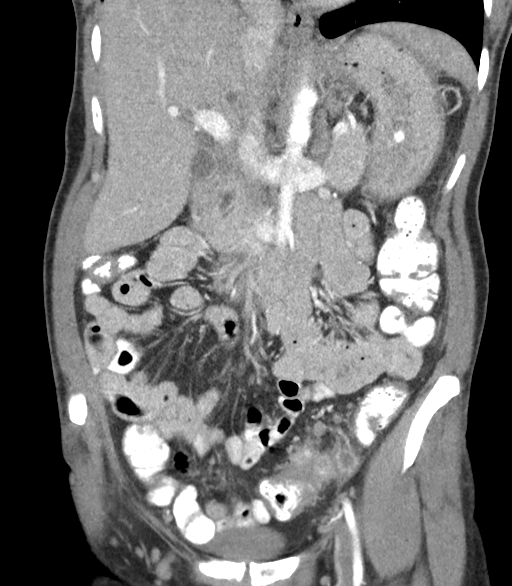
[im 56/101  soft-tissue]
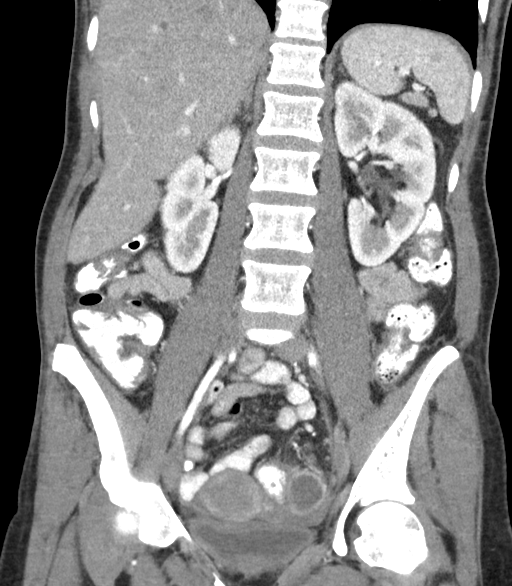

[15 of 46 positions shown; findings below may reference images not displayed]

FINDINGS: Lower chest: No acute abnormality.

Hepatobiliary: Remarkable interval improvement in the appearance of
multi loculated fluid collection previously identified in the
caudate lobe. The caudate lobe has returned to a more normal size.
Only a vague region of relatively decreased attenuation remains
present at 2.4 x 1.3 cm. No visible residual fluid loculations are
present. Similarly, the heterogeneity and small abscesses present in
the posterior aspect of the left hepatic lobe have also completely
resolved. Two small focal low-attenuation foci in the more superior
aspect of the right hepatic lobe remain unchanged and likely reflect
small cysts. Gallbladder is unremarkable. No intra or extrahepatic
biliary ductal dilatation.

Pancreas: Unremarkable. No pancreatic ductal dilatation or
surrounding inflammatory changes.

Spleen: No splenic injury or perisplenic hematoma.

Adrenals/Urinary Tract: Normal adrenal glands. Normal right kidney.
Stable 2.2 cm simple cyst in the interpolar left kidney. No
hydronephrosis or nephrolithiasis. Unremarkable ureters and bladder.

Stomach/Bowel: Normal appendix in the right lower quadrant. No
evidence of bowel obstruction. Highly abnormal appearance of the
sigmoid colon with diffuse wall thickening which is increased
compared to prior. Similar to slightly enlarged intramural fluid
collection measuring 2.4 x 1.8 cm containing a small amount of
contrast material. This may represent a giant diverticulum versus
focal intramural abscess. Giant diverticulum is favored. A second
smaller but similar appearing giant diverticulum is present more
proximally (best seen on coronal reformatted image 49/50). Similar
appearance of abnormal lymphadenopathy in the associated sigmoid
mesentery measuring up to 0.9 cm in short axis. This is unchanged
compared to prior.

Vascular/Lymphatic: No significant vascular findings are present.
Abnormal sigmoid mesenteric lymphadenopathy as described above.

Reproductive: Uterus and bilateral adnexa are unremarkable.
Circumscribed 11 mm fluid attenuation cystic structure within the
inferior aspect of the left labia incidentally noted.

Other: Interval removal of percutaneous drainage catheter. No free
fluid in the abdomen or pelvis. No abdominal wall hernia.

Musculoskeletal: No acute or significant osseous findings.
IMPRESSION: 1. Remarkable interval improvement in multifocal hepatic abscesses
compared to prior imaging from [DATE]. No residual hepatic
abscess, only minimal residual hypoenhancement likely reflecting
areas of edema or scarring.
2. Persistent and worsening diffuse submucosal edema and thickening
along a highly diseased segment of the sigmoid colon with 2 focal
intramural and slightly exophytic fluid and contrast collections
either representing intramural abscesses or giant diverticula. Giant
diverticula are favored. Additionally, there is persistent abnormal
lymphadenopathy within the sigmoid mesocolon. Differential
considerations include colonic neoplasm with superimposed
inflammation/infection and metastatic adenopathy versus chronic
smoldering diverticulitis and associated reactive adenopathy. This
abnormality likely represents the source of the patient's prior
hepatic abscesses and may place the patient at risk for developing
recurrent hepatic abscesses in the future.
3. Additional ancillary findings as above.

## 2020-11-29 MED ORDER — IOHEXOL 350 MG/ML SOLN
80.0000 mL | Freq: Once | INTRAVENOUS | Status: AC | PRN
  Administered 2020-11-29: 80 mL via INTRAVENOUS

## 2020-12-03 ENCOUNTER — Ambulatory Visit: Payer: Medicaid Other | Admitting: Nurse Practitioner

## 2020-12-06 ENCOUNTER — Ambulatory Visit: Payer: Medicaid Other | Admitting: Infectious Disease

## 2020-12-13 ENCOUNTER — Other Ambulatory Visit: Payer: Self-pay

## 2020-12-13 ENCOUNTER — Ambulatory Visit (INDEPENDENT_AMBULATORY_CARE_PROVIDER_SITE_OTHER): Payer: Medicaid Other | Admitting: Infectious Disease

## 2020-12-13 ENCOUNTER — Encounter: Payer: Self-pay | Admitting: Infectious Disease

## 2020-12-13 VITALS — BP 127/84 | HR 95 | Temp 98.5°F | Wt 125.6 lb

## 2020-12-13 DIAGNOSIS — K5792 Diverticulitis of intestine, part unspecified, without perforation or abscess without bleeding: Secondary | ICD-10-CM | POA: Diagnosis not present

## 2020-12-13 DIAGNOSIS — R7881 Bacteremia: Secondary | ICD-10-CM

## 2020-12-13 DIAGNOSIS — K67 Disorders of peritoneum in infectious diseases classified elsewhere: Secondary | ICD-10-CM

## 2020-12-13 DIAGNOSIS — B955 Unspecified streptococcus as the cause of diseases classified elsewhere: Secondary | ICD-10-CM

## 2020-12-13 DIAGNOSIS — K750 Abscess of liver: Secondary | ICD-10-CM

## 2020-12-13 DIAGNOSIS — K529 Noninfective gastroenteritis and colitis, unspecified: Secondary | ICD-10-CM

## 2020-12-13 HISTORY — DX: Diverticulitis of intestine, part unspecified, without perforation or abscess without bleeding: K57.92

## 2020-12-13 NOTE — Progress Notes (Signed)
Subjective:  Chief complaint loose stools with antibiotics   Patient ID: Nicole Browning, female    DOB: 21-Dec-1970, 50 y.o.   MRN: [id number]  HPI  50 year old black woman who was recently diagnosed with inflammatory bowel disease via colonoscopy in April 2022 who presented this August with abdominal pain nausea and vomiting.  Initial imaging with CT abdomen pelvis was concerning for sigmoid mass with possible hepatic metastases.  She was started on ceftriaxone for possible urinary tract infection though I do not see documentation of urinary symptoms.  She also has multiple intra-abdominal fluid collections concerning for abscesses she underwent IR guided drainage of 1 of these which yielded Streptococcus F.  She then underwent liver biopsy on August 1 with purulent material encountered biopsy sent which was showing necrotizing infection culture yielded good Streptococcus constellatus.  Patient was changed over to levofloxacin and metronidazole and was discharged home on this along with a drain that was still in place in the abdomen.  Ultimately drain was removed and she has had full CT scan since then including 1 performed in November 29, 2020 which showed complete resolution of prior multiloculated fluid collection seen in the caudate lobe of the liver, continued area in the colon containing small amount of contrast material thought to likely be a large diverticulum versus a focal intramural abscess with the former thought to be more likely.  She is remained on levofloxacin and metronidazole and came back for a follow-up visit as requested.  She feels relatively well other than the loose stools she is having from time to time on the antibiotics she also says that this aggravates her hemorrhoids.     Past Medical History:  Diagnosis Date   Anemia    Diverticulitis 12/13/2020   GERD (gastroesophageal reflux disease)    IBS (irritable bowel syndrome)    Perirectal abscess     Protein-calorie malnutrition ([place]) 02/29/2016   Tobacco use    UC (ulcerative colitis) ([place])     Past Surgical History:  Procedure Laterality Date   BIOPSY  09/29/2020   Procedure: BIOPSY;  Surgeon: Jackquline Denmark, MD;  Location: [facility] ENDOSCOPY;  Service: Endoscopy;;   ESOPHAGOGASTRODUODENOSCOPY (EGD) WITH PROPOFOL N/A 09/29/2020   Procedure: ESOPHAGOGASTRODUODENOSCOPY (EGD) WITH PROPOFOL;  Surgeon: Jackquline Denmark, MD;  Location: [facility] ENDOSCOPY;  Service: Endoscopy;  Laterality: N/A;   IR RADIOLOGIST EVAL & MGMT  10/17/2020   IRRIGATION AND DEBRIDEMENT ABSCESS N/A 02/24/2016   Procedure: IRRIGATION AND DEBRIDEMENT ABSCESS;  Surgeon: Stark Klein, MD;  Location: [place];  Service: General;  Laterality: N/A;    Family History  Problem Relation Age of Onset   Diabetes Mother    COPD Father    Leukemia Brother    Colon cancer Neg Hx    Colon polyps Neg Hx    Esophageal cancer Neg Hx    Rectal cancer Neg Hx    Stomach cancer Neg Hx       Social History   Socioeconomic History   Marital status: Single    Spouse name: Not on file   Number of children: Not on file   Years of education: Not on file   Highest education level: Not on file  Occupational History   Not on file  Tobacco Use   Smoking status: Every Day    Packs/day: 0.33    Years: 28.00    Pack years: 9.24    Types: Cigarettes   Smokeless tobacco: Never  Vaping Use   Vaping Use: Never used  Substance and Sexual Activity   Alcohol use: Yes    Alcohol/week: 2.0 standard drinks    Types: 2 Glasses of wine per week   Drug use: No   Sexual activity: Not Currently    Birth control/protection: Injection  Other Topics Concern   Not on file  Social History Narrative   Not on file   Social Determinants of Health   Financial Resource Strain: Not on file  Food Insecurity: Not on file  Transportation Needs: Not on file  Physical Activity: Not on file  Stress: Not on file  Social Connections: Not on file    Allergies   Allergen Reactions   Bactrim [Sulfamethoxazole-Trimethoprim] Hives    Immediate body wide hives   Percocet [Oxycodone-Acetaminophen] Nausea Only   Valium [Diazepam] Nausea Only     Current Outpatient Medications:    aspirin EC 81 MG tablet, Take 1 tablet (81 mg total) by mouth daily with breakfast., Disp: 30 tablet, Rfl: 0   bismuth subsalicylate (PEPTO BISMOL) 262 MG/15ML suspension, Take 30 mLs by mouth every 6 (six) hours as needed for indigestion., Disp: , Rfl:    ibuprofen (ADVIL) 200 MG tablet, Take 200 mg by mouth every 6 (six) hours as needed for moderate pain., Disp: , Rfl:    levofloxacin (LEVAQUIN) 750 MG tablet, Take 1 tablet (750 mg total) by mouth daily., Disp: 20 tablet, Rfl: 0   mesalamine (LIALDA) 1.2 g EC tablet, Take 1 tablet (1.2 g total) by mouth in the morning and at bedtime., Disp: 60 tablet, Rfl: 1   metroNIDAZOLE (FLAGYL) 500 MG tablet, Take 1 tablet (500 mg total) by mouth 3 (three) times daily., Disp: 40 tablet, Rfl: 0   Multiple Vitamins-Minerals (WOMENS MULTIVITAMIN PO), Take 1 tablet by mouth daily., Disp: , Rfl:    ondansetron (ZOFRAN) 4 MG tablet, Take 1 tablet (4 mg total) by mouth every 6 (six) hours as needed for nausea., Disp: 20 tablet, Rfl: 0   pantoprazole (PROTONIX) 40 MG tablet, Take 1 tablet (40 mg total) by mouth daily., Disp: 30 tablet, Rfl: 2   POTASSIUM PO, Take 1 tablet by mouth daily., Disp: , Rfl:    Vitamin D, Ergocalciferol, (DRISDOL) 1.25 MG (50000 UNIT) CAPS capsule, Take 1 capsule (50,000 Units total) by mouth every 7 (seven) days. TAKE ONE CAPSULE BY MOUTH ONCE WEEKLY, Disp: 12 capsule, Rfl: 0   VITAMIN E PO, Take 1 tablet by mouth daily., Disp: , Rfl:     Review of Systems  Constitutional:  Negative for activity change, appetite change, chills, diaphoresis, fatigue, fever and unexpected weight change.  HENT:  Negative for congestion, rhinorrhea, sinus pressure, sneezing, sore throat and trouble swallowing.   Eyes:  Negative for  photophobia and visual disturbance.  Respiratory:  Negative for cough, chest tightness, shortness of breath, wheezing and stridor.   Cardiovascular:  Negative for chest pain, palpitations and leg swelling.  Gastrointestinal:  Positive for constipation and rectal pain. Negative for abdominal distention, abdominal pain, anal bleeding, blood in stool, diarrhea, nausea and vomiting.  Genitourinary:  Negative for difficulty urinating, dysuria, flank pain and hematuria.  Musculoskeletal:  Negative for arthralgias, back pain, gait problem, joint swelling and myalgias.  Skin:  Negative for color change, pallor, rash and wound.  Neurological:  Negative for dizziness, tremors, weakness and light-headedness.  Hematological:  Negative for adenopathy. Does not bruise/bleed easily.  Psychiatric/Behavioral:  Negative for agitation, behavioral problems, confusion, decreased concentration, dysphoric mood and sleep disturbance.       Objective:  Physical Exam Constitutional:      General: She is not in acute distress.    Appearance: Normal appearance. She is well-developed. She is not ill-appearing or diaphoretic.  HENT:     Head: Normocephalic and atraumatic.     Right Ear: Hearing and external ear normal.     Left Ear: Hearing and external ear normal.     Nose: No nasal deformity or rhinorrhea.  Eyes:     General: No scleral icterus.    Conjunctiva/sclera: Conjunctivae normal.     Right eye: Right conjunctiva is not injected.     Left eye: Left conjunctiva is not injected.     Pupils: Pupils are equal, round, and reactive to light.  Neck:     Vascular: No JVD.  Cardiovascular:     Rate and Rhythm: Normal rate and regular rhythm.     Heart sounds: Normal heart sounds, S1 normal and S2 normal. No murmur heard.   No friction rub.  Pulmonary:     Effort: Pulmonary effort is normal. No respiratory distress.  Abdominal:     General: Bowel sounds are normal. There is no distension.     Palpations:  Abdomen is soft.     Tenderness: There is no abdominal tenderness.  Musculoskeletal:        General: Normal range of motion.     Right shoulder: Normal.     Left shoulder: Normal.     Cervical back: Normal range of motion and neck supple.     Right hip: Normal.     Left hip: Normal.     Right knee: Normal.     Left knee: Normal.  Lymphadenopathy:     Head:     Right side of head: No submandibular, preauricular or posterior auricular adenopathy.     Left side of head: No submandibular, preauricular or posterior auricular adenopathy.     Cervical: No cervical adenopathy.     Right cervical: No superficial or deep cervical adenopathy.    Left cervical: No superficial or deep cervical adenopathy.  Skin:    General: Skin is warm and dry.     Coloration: Skin is not pale.     Findings: No abrasion, bruising, ecchymosis, erythema, lesion or rash.     Nails: There is no clubbing.  Neurological:     General: No focal deficit present.     Mental Status: She is alert and oriented to person, place, and time.     Sensory: No sensory deficit.     Coordination: Coordination normal.     Gait: Gait normal.  Psychiatric:        Attention and Perception: She is attentive.        Mood and Affect: Mood normal.        Speech: Speech normal.        Behavior: Behavior normal. Behavior is cooperative.        Thought Content: Thought content normal.        Judgment: Judgment normal.          Assessment & Plan:   Intra-abdominal and liver abscesses with group F streptococcus isolated as well as Streptococcus constellatus status post IV therapy followed by levofloxacin and metronidazole with radiographic resolution of her liver abscesses and intra-abdominal abscesses.  Source appears to be a large diverticulum seen on CT scan from September 29 20,022.  I think it is safe to stop her antibiotics at present.  I do think she needs to be followed  up both by GI and surgery in particular for  contemplation for partial colectomy.  Large diverticulum (note radiology notes that this could also potentially be an intra mural abscess though this seems less likely):  Again another reason why she needs to be seen by general surgery so they can review these films.  IBD: needs followup with GI for this as well  Vaccine counseling I recommended that she get an updated bivalant booster for COVID 19 and flu shot but she worried she would get sick after having the vaccine and that this might make her miss a day of work.    I spent 42  minutes with the patient including  face to face counseling of the patient regarding the nature of diverticulosis diverticulitis and the ways that infection can spread from diverticulitis to areas such as the liver or bloodstream or abdomen personally reviewing CT of the abdomen pelvis performed on November 20, 2020, reviewing culture data is from her liver abscess or intra-abdominal abscess along with , review of medical records in preparation for the visit and during the visit and in coordination of her care.

## 2020-12-14 ENCOUNTER — Ambulatory Visit: Payer: Medicaid Other | Admitting: Nurse Practitioner

## 2021-01-21 ENCOUNTER — Encounter: Payer: Self-pay | Admitting: Radiology

## 2021-02-20 ENCOUNTER — Encounter: Payer: Medicaid Other | Admitting: Obstetrics and Gynecology

## 2021-06-21 ENCOUNTER — Ambulatory Visit (HOSPITAL_COMMUNITY)
Admission: RE | Admit: 2021-06-21 | Discharge: 2021-06-21 | Disposition: A | Discharge status: Home / Self Care | Payer: Medicaid Other | Source: Ambulatory Visit | Attending: Internal Medicine | Admitting: Internal Medicine

## 2021-06-21 ENCOUNTER — Encounter (HOSPITAL_COMMUNITY): Payer: Self-pay

## 2021-06-21 VITALS — BP 111/70 | HR 96 | Temp 98.2°F | Resp 16 | Ht 66.0 in | Wt 125.7 lb

## 2021-06-21 DIAGNOSIS — L0231 Cutaneous abscess of buttock: Secondary | ICD-10-CM

## 2021-06-21 MED ORDER — DOXYCYCLINE HYCLATE 100 MG PO CAPS: 100.0000 mg | ORAL_CAPSULE | Freq: Two times a day (BID) | ORAL | 0 refills | Status: DC

## 2021-06-21 NOTE — ED Triage Notes (Signed)
Pt reports abscess on buttocks x 3 days. States she has been taking epsom salt baths and abscess is now ready to "pop".  ?

## 2021-06-21 NOTE — Discharge Instructions (Signed)
You have been prescribed antibiotics for your abscess to your left buttocks.  Please continue warm Epsom salt soaks and/or warm compresses.  Go to the hospital if no improvement in symptoms in the next 48 to 72 hours. ?

## 2021-06-21 NOTE — ED Provider Notes (Signed)
?[place] ? ? ? ?CSN: [id number] ?Arrival date & time: 06/21/21  1820 ? ? ?  ? ?History   ?Chief Complaint ?Chief Complaint  ?Patient presents with  ? Abscess  ? ? ?HPI ?Nicole Browning is a 51 y.o. female.  ? ?Patient presents with abscess to left buttock that has been present for approximately 3 days.  Patient reports history of the same in the same location.  She has been using Epsom salt soaks with no improvement.  Denies any purulent drainage from the area.  Denies fevers, body aches, chills. ? ? ?Abscess ? ?Past Medical History:  ?Diagnosis Date  ? Anemia   ? Diverticulitis 12/13/2020  ? GERD (gastroesophageal reflux disease)   ? IBS (irritable bowel syndrome)   ? Perirectal abscess   ? Protein-calorie malnutrition ([place]) 02/29/2016  ? Tobacco use   ? UC (ulcerative colitis) ([place])   ? ? ?Patient Active Problem List  ? Diagnosis Date Noted  ? Diverticulitis 12/13/2020  ? Smoking 11/22/2020  ? Liver abscess 10/17/2020  ? Medication monitoring encounter 10/17/2020  ? Streptococcal bacteremia   ? Intra-abdominal abscess ([place]) 10/03/2020  ? AKI (acute kidney injury) ([place]) 10/03/2020  ? Protein-calorie malnutrition, severe 09/28/2020  ? IDA (iron deficiency anemia) 07/24/2020  ? IBD (inflammatory bowel disease) 07/24/2020  ? Protein-calorie malnutrition ([place]) 02/29/2016  ? Elevated hemoglobin A1c 02/27/2016  ? GERD (gastroesophageal reflux disease)   ? Tobacco use   ? Anemia of chronic disease   ? Prediabetes   ? Perirectal abscess 02/24/2016  ? ? ?Past Surgical History:  ?Procedure Laterality Date  ? BIOPSY  09/29/2020  ? Procedure: BIOPSY;  Surgeon: Jackquline Denmark, MD;  Location: [facility] ENDOSCOPY;  Service: Endoscopy;;  ? ESOPHAGOGASTRODUODENOSCOPY (EGD) WITH PROPOFOL N/A 09/29/2020  ? Procedure: ESOPHAGOGASTRODUODENOSCOPY (EGD) WITH PROPOFOL;  Surgeon: Jackquline Denmark, MD;  Location: [facility] ENDOSCOPY;  Service: Endoscopy;  Laterality: N/A;  ? IR RADIOLOGIST EVAL & MGMT  10/17/2020  ? IRRIGATION AND DEBRIDEMENT  ABSCESS N/A 02/24/2016  ? Procedure: IRRIGATION AND DEBRIDEMENT ABSCESS;  Surgeon: Stark Klein, MD;  Location: [place];  Service: General;  Laterality: N/A;  ? ? ?OB History   ?No obstetric history on file. ?  ? ? ? ?Home Medications   ? ?Prior to Admission medications   ?Medication Sig Start Date End Date Taking? Authorizing Provider  ?doxycycline (VIBRAMYCIN) 100 MG capsule Take 1 capsule (100 mg total) by mouth 2 (two) times daily. 06/21/21  Yes Teodora Medici, FNP  ?aspirin EC 81 MG tablet Take 1 tablet (81 mg total) by mouth daily with breakfast. 10/05/20 10/05/21  Roxan Hockey, MD  ?bismuth subsalicylate (PEPTO BISMOL) 262 MG/15ML suspension Take 30 mLs by mouth every 6 (six) hours as needed for indigestion.    [provider]  ?ibuprofen (ADVIL) 200 MG tablet Take 200 mg by mouth every 6 (six) hours as needed for moderate pain.    [provider]  ?levofloxacin (LEVAQUIN) 750 MG tablet Take 1 tablet (750 mg total) by mouth daily. 10/25/20   [name], MD  ?mesalamine (LIALDA) 1.2 g EC tablet Take 1 tablet (1.2 g total) by mouth in the morning and at bedtime. 07/11/20   Noralyn Pick, NP  ?metroNIDAZOLE (FLAGYL) 500 MG tablet Take 1 tablet (500 mg total) by mouth 3 (three) times daily. 10/25/20   [name], MD  ?Multiple Vitamins-Minerals (WOMENS MULTIVITAMIN PO) Take 1 tablet by mouth daily.    [provider]  ?ondansetron (ZOFRAN) 4 MG tablet Take  1 tablet (4 mg total) by mouth every 6 (six) hours as needed for nausea. 10/05/20   Roxan Hockey, MD  ?pantoprazole (PROTONIX) 40 MG tablet Take 1 tablet (40 mg total) by mouth daily. 10/06/20   Roxan Hockey, MD  ?POTASSIUM PO Take 1 tablet by mouth daily.    [provider]  ?Vitamin D, Ergocalciferol, (DRISDOL) 1.25 MG (50000 UNIT) CAPS capsule Take 1 capsule (50,000 Units total) by mouth every 7 (seven) days. TAKE ONE CAPSULE BY MOUTH ONCE WEEKLY 07/22/20   Noralyn Pick, NP  ?VITAMIN E PO  Take 1 tablet by mouth daily.    [provider]  ? ? ?Family History ?Family History  ?Problem Relation Age of Onset  ? Diabetes Mother   ? COPD Father   ? Leukemia Brother   ? Colon cancer Neg Hx   ? Colon polyps Neg Hx   ? Esophageal cancer Neg Hx   ? Rectal cancer Neg Hx   ? Stomach cancer Neg Hx   ? ? ?Social History ?Social History  ? ?Tobacco Use  ? Smoking status: Every Day  ?  Packs/day: 0.33  ?  Years: 28.00  ?  Pack years: 9.24  ?  Types: Cigarettes  ? Smokeless tobacco: Never  ?Vaping Use  ? Vaping Use: Never used  ?Substance Use Topics  ? Alcohol use: Yes  ?  Alcohol/week: 2.0 standard drinks  ?  Types: 2 Glasses of wine per week  ? Drug use: No  ? ? ? ?Allergies   ?Bactrim [sulfamethoxazole-trimethoprim], Percocet [oxycodone-acetaminophen], and Valium [diazepam] ? ? ?Review of Systems ?Review of Systems ?Per HPI ? ?Physical Exam ?Triage Vital Signs ?ED Triage Vitals  ?Enc Vitals Group  ?   BP 06/21/21 1851 111/70  ?   Pulse Rate 06/21/21 1851 96  ?   Resp 06/21/21 1851 16  ?   Temp 06/21/21 1851 98.2 ?F (36.8 ?C)  ?   Temp Source 06/21/21 1851 Oral  ?   SpO2 06/21/21 1851 100 %  ?   Weight 06/21/21 1850 125 lb 10.6 oz (57 kg)  ?   Height 06/21/21 1850 5' 6"  (1.676 m)  ?   Head Circumference --   ?   Peak Flow --   ?   Pain Score 06/21/21 1850 9  ?   Pain Loc --   ?   Pain Edu? --   ?   Excl. in [place]? --   ? ?No data found. ? ?Updated Vital Signs ?BP 111/70 (BP Location: Right Arm)   Pulse 96   Temp 98.2 ?F (36.8 ?C) (Oral)   Resp 16   Ht 5' 6"  (1.676 m)   Wt 125 lb 10.6 oz (57 kg)   SpO2 100%   BMI 20.28 kg/m?  ? ?Visual Acuity ?Right Eye Distance:   ?Left Eye Distance:   ?Bilateral Distance:   ? ?Right Eye Near:   ?Left Eye Near:    ?Bilateral Near:    ? ?Physical Exam ?Constitutional:   ?   General: She is not in acute distress. ?   Appearance: Normal appearance. She is not toxic-appearing or diaphoretic.  ?HENT:  ?   Head: Normocephalic and atraumatic.  ?Eyes:  ?   Extraocular  Movements: Extraocular movements intact.  ?   Conjunctiva/sclera: Conjunctivae normal.  ?Pulmonary:  ?   Effort: Pulmonary effort is normal.  ?Skin: ? ?    ?   Comments: Approximately 2.5 to 3 cm in diameter indurated  area to left buttocks.  No drainage noted.  ?Neurological:  ?   General: No focal deficit present.  ?   Mental Status: She is alert and oriented to person, place, and time. Mental status is at baseline.  ?Psychiatric:     ?   Mood and Affect: Mood normal.     ?   Behavior: Behavior normal.     ?   Thought Content: Thought content normal.     ?   Judgment: Judgment normal.  ? ? ? ?UC Treatments / Results  ?Labs ?(all labs ordered are listed, but only abnormal results are displayed) ?Labs Reviewed - No data to display ? ?EKG ? ? ?Radiology ?No results found. ? ?Procedures ?Procedures (including critical care time) ? ?Medications Ordered in UC ?Medications - No data to display ? ?Initial Impression / Assessment and Plan / UC Course  ?I have reviewed the triage vital signs and the nursing notes. ? ?Pertinent labs & imaging results that were available during my care of the patient were reviewed by me and considered in my medical decision making (see chart for details). ? ?  ? ?Will defer I&D at this time as abscess is indurated and not conducive to drainage.  Will treat with doxycycline antibiotic.  Patient was given strict return precautions and advised that she may need to follow-up at ED or urgent care if symptoms persist, worsen, do not improve.  Patient to continue warm Epsom salt soaks.  Discussed return precautions.  May need to see general surgery as well given recurrent issue.  Patient verbalized understanding and was agreeable with plan. ?Final Clinical Impressions(s) / UC Diagnoses  ? ?Final diagnoses:  ?Abscess of left buttock  ? ? ? ?Discharge Instructions   ? ?  ?You have been prescribed antibiotics for your abscess to your left buttocks.  Please continue warm Epsom salt soaks and/or warm  compresses.  Go to the hospital if no improvement in symptoms in the next 48 to 72 hours. ? ? ? ?ED Prescriptions   ? ? Medication Sig Dispense Auth. Provider  ? doxycycline (VIBRAMYCIN) 100 MG capsule Take 1 capsule (100 mg

## 2021-08-08 ENCOUNTER — Ambulatory Visit: Payer: Medicaid Other | Admitting: Gastroenterology

## 2021-09-12 ENCOUNTER — Encounter (HOSPITAL_COMMUNITY): Payer: Self-pay | Admitting: Emergency Medicine

## 2021-09-12 ENCOUNTER — Ambulatory Visit (HOSPITAL_COMMUNITY)
Admission: EM | Admit: 2021-09-12 | Discharge: 2021-09-12 | Disposition: A | Discharge status: Home / Self Care | Payer: Medicaid Other | Attending: Internal Medicine | Admitting: Internal Medicine

## 2021-09-12 DIAGNOSIS — L0501 Pilonidal cyst with abscess: Secondary | ICD-10-CM

## 2021-09-12 MED ORDER — DOXYCYCLINE HYCLATE 100 MG PO CAPS: 100.0000 mg | ORAL_CAPSULE | Freq: Two times a day (BID) | ORAL | 0 refills | Status: DC

## 2021-09-12 NOTE — ED Provider Notes (Addendum)
[facility]    CSN: [id number] Arrival date & time: 09/12/21  1214      History   Chief Complaint Chief Complaint  Patient presents with   Abscess    HPI Nicole Browning is a 51 y.o. female.   Patient presents to urgent care for evaluation of her pilonidal abscess that has grown significantly in size over the last 3 days.  Patient states that she burned a paperclip edge and used that to puncture her abscess a few days ago and attempt to allow it to drain.  The abscesses began to drain only slightly but continues to grow in size and tenderness.  Patient states that it is difficult for her to sit comfortably due to the abscess and walk comfortably.  She frequently gets these abscesses in different areas of her body.  Drainage from abscess is white and thick.  Patient denies fever/chills, body aches, nausea, vomiting, abdominal pain, and difficulty defecating.  No urinary symptoms reported.  She has attempted use of Epsom salt soaks without relief of abscess.   Abscess   Past Medical History:  Diagnosis Date   Anemia    Diverticulitis 12/13/2020   GERD (gastroesophageal reflux disease)    IBS (irritable bowel syndrome)    Perirectal abscess    Protein-calorie malnutrition ([place]) 02/29/2016   Tobacco use    UC (ulcerative colitis) ([place])     Patient Active Problem List   Diagnosis Date Noted   Diverticulitis 12/13/2020   Smoking 11/22/2020   Liver abscess 10/17/2020   Medication monitoring encounter 10/17/2020   Streptococcal bacteremia    Intra-abdominal abscess ([place]) 10/03/2020   AKI (acute kidney injury) ([place]) 10/03/2020   Protein-calorie malnutrition, severe 09/28/2020   IDA (iron deficiency anemia) 07/24/2020   IBD (inflammatory bowel disease) 07/24/2020   Protein-calorie malnutrition ([place]) 02/29/2016   Elevated hemoglobin A1c 02/27/2016   GERD (gastroesophageal reflux disease)    Tobacco use    Anemia of chronic disease    Prediabetes    Perirectal  abscess 02/24/2016    Past Surgical History:  Procedure Laterality Date   BIOPSY  09/29/2020   Procedure: BIOPSY;  Surgeon: Jackquline Denmark, MD;  Location: [place];  Service: Endoscopy;;   ESOPHAGOGASTRODUODENOSCOPY (EGD) WITH PROPOFOL N/A 09/29/2020   Procedure: ESOPHAGOGASTRODUODENOSCOPY (EGD) WITH PROPOFOL;  Surgeon: Jackquline Denmark, MD;  Location: [facility] ENDOSCOPY;  Service: Endoscopy;  Laterality: N/A;   IR RADIOLOGIST EVAL & MGMT  10/17/2020   IRRIGATION AND DEBRIDEMENT ABSCESS N/A 02/24/2016   Procedure: IRRIGATION AND DEBRIDEMENT ABSCESS;  Surgeon: Stark Klein, MD;  Location: [place];  Service: General;  Laterality: N/A;    OB History   No obstetric history on file.      Home Medications    Prior to Admission medications   Medication Sig Start Date End Date Taking? Authorizing Provider  doxycycline (VIBRAMYCIN) 100 MG capsule Take 1 capsule (100 mg total) by mouth 2 (two) times daily. 09/12/21  Yes Talbot Grumbling, FNP  aspirin EC 81 MG tablet Take 1 tablet (81 mg total) by mouth daily with breakfast. 10/05/20 10/05/21  Roxan Hockey, MD  bismuth subsalicylate (PEPTO BISMOL) 262 MG/15ML suspension Take 30 mLs by mouth every 6 (six) hours as needed for indigestion.    [provider]  ibuprofen (ADVIL) 200 MG tablet Take 200 mg by mouth every 6 (six) hours as needed for moderate pain.    [provider]  mesalamine (LIALDA) 1.2 g EC tablet Take 1 tablet (1.2 g total)  by mouth in the morning and at bedtime. 07/11/20   Noralyn Pick, NP  Multiple Vitamins-Minerals (WOMENS MULTIVITAMIN PO) Take 1 tablet by mouth daily.    [provider]  pantoprazole (PROTONIX) 40 MG tablet Take 1 tablet (40 mg total) by mouth daily. 10/06/20   Roxan Hockey, MD  POTASSIUM PO Take 1 tablet by mouth daily.    [provider]  Vitamin D, Ergocalciferol, (DRISDOL) 1.25 MG (50000 UNIT) CAPS capsule Take 1 capsule (50,000 Units total) by mouth every 7  (seven) days. TAKE ONE CAPSULE BY MOUTH ONCE WEEKLY 07/22/20   Noralyn Pick, NP  VITAMIN E PO Take 1 tablet by mouth daily.    [provider]    Family History Family History  Problem Relation Age of Onset   Diabetes Mother    COPD Father    Leukemia Brother    Colon cancer Neg Hx    Colon polyps Neg Hx    Esophageal cancer Neg Hx    Rectal cancer Neg Hx    Stomach cancer Neg Hx     Social History Social History   Tobacco Use   Smoking status: Every Day    Packs/day: 0.33    Years: 28.00    Total pack years: 9.24    Types: Cigarettes   Smokeless tobacco: Never  Vaping Use   Vaping Use: Never used  Substance Use Topics   Alcohol use: Yes    Alcohol/week: 2.0 standard drinks of alcohol    Types: 2 Glasses of wine per week   Drug use: No     Allergies   Bactrim [sulfamethoxazole-trimethoprim], Percocet [oxycodone-acetaminophen], and Valium [diazepam]   Review of Systems Review of Systems Per HPI  Physical Exam Triage Vital Signs ED Triage Vitals  Enc Vitals Group     BP 09/12/21 1402 107/75     Pulse Rate 09/12/21 1402 (!) 106     Resp 09/12/21 1402 18     Temp 09/12/21 1402 98.1 F (36.7 C)     Temp Source 09/12/21 1402 Oral     SpO2 09/12/21 1402 97 %     Weight --      Height --      Head Circumference --      Peak Flow --      Pain Score 09/12/21 1400 10     Pain Loc --      Pain Edu? --      Excl. in [place]? --    No data found.  Updated Vital Signs BP 107/75 (BP Location: Right Arm)   Pulse (!) 106   Temp 98.1 F (36.7 C) (Oral)   Resp 18   SpO2 97%   Visual Acuity Right Eye Distance:   Left Eye Distance:   Bilateral Distance:    Right Eye Near:   Left Eye Near:    Bilateral Near:     Physical Exam Vitals and nursing note reviewed. Exam conducted with a chaperone present Morey Hummingbird, Therapist, sports).  Constitutional:      Appearance: Normal appearance. She is not ill-appearing or toxic-appearing.     Comments: Very pleasant  patient sitting on exam in position of comfort table in no acute distress.   HENT:     Head: Normocephalic and atraumatic.     Right Ear: Hearing and external ear normal.     Left Ear: Hearing and external ear normal.     Nose: Nose normal.     Mouth/Throat:  Lips: Pink.     Mouth: Mucous membranes are moist.  Eyes:     General: Lids are normal. Vision grossly intact. Gaze aligned appropriately.     Extraocular Movements: Extraocular movements intact.     Conjunctiva/sclera: Conjunctivae normal.  Pulmonary:     Effort: Pulmonary effort is normal.  Abdominal:     Palpations: Abdomen is soft.  Genitourinary:    Exam position: Knee-chest position.       Comments: Large pilonidal cyst present.  Pilonidal abscess is approximately 2.5-3cm in diameter and is very fluctuant.  No swelling of the soft tissue surrounding the abscess present.  No erythema or warmth to the abscess.  There is a small area to the abscess that is draining at this time.  External hemorrhoids are also present to exam.  Hemorrhoids are not inflamed and not bleeding.  Musculoskeletal:     Cervical back: Neck supple.  Skin:    General: Skin is warm and dry.     Capillary Refill: Capillary refill takes less than 2 seconds.     Findings: Abscess present. No rash.  Neurological:     General: No focal deficit present.     Mental Status: She is alert and oriented to person, place, and time. Mental status is at baseline.     Cranial Nerves: No dysarthria or facial asymmetry.     Gait: Gait is intact.  Psychiatric:        Mood and Affect: Mood normal.        Speech: Speech normal.        Behavior: Behavior normal.        Thought Content: Thought content normal.        Judgment: Judgment normal.      UC Treatments / Results  Labs (all labs ordered are listed, but only abnormal results are displayed) Labs Reviewed - No data to display  EKG   Radiology No results found.  Procedures Incision and  Drainage  Date/Time: 09/12/2021 3:25 PM  Performed by: Talbot Grumbling, FNP Authorized by: Talbot Grumbling, FNP   Consent:    Consent obtained:  Verbal   Consent given by:  Patient   Risks, benefits, and alternatives were discussed: yes     Risks discussed:  Bleeding, incomplete drainage, pain, infection and damage to other organs Universal protocol:    Patient identity confirmed:  Verbally with patient Location:    Type:  Pilonidal cyst   Size:  2-3cm by 2-3cm   Location:  Anogenital   Anogenital location:  Pilonidal Pre-procedure details:    Skin preparation:  Povidone-iodine Sedation:    Sedation type:  None Anesthesia:    Anesthesia method:  Local infiltration   Local anesthetic:  Lidocaine 1% WITH epi Procedure type:    Complexity:  Simple Procedure details:    Incision types:  Stab incision   Incision depth:  Dermal   Wound management:  Irrigated with saline and probed and deloculated   Drainage:  Purulent and bloody   Drainage amount:  Copious   Wound treatment:  Wound left open   Packing materials:  None Post-procedure details:    Procedure completion:  Tolerated well, no immediate complications  (including critical care time)  Medications Ordered in UC Medications - No data to display  Initial Impression / Assessment and Plan / UC Course  I have reviewed the triage vital signs and the nursing notes.  Pertinent labs & imaging results that were available during my care of  the patient were reviewed by me and considered in my medical decision making (see chart for details).  1.  Pilonidal abscess Incision and drainage performed.  See procedure note above for further detail.  Wound cleansed and dressed in clinic with nonstick gauze and tape.  Patient to change dressing twice daily and monitor for worsening signs of infection.  Patient placed on doxycycline antibiotic twice daily for the next 10 days.  She may use Tylenol and ibuprofen over-the-counter  for pain as needed at home.  Encourage patient to massage the area gently at least once daily to express further infected material.  Wound left open to continue to drain.   Discussed physical exam and available lab work findings in clinic with patient.  Counseled patient regarding appropriate use of medications and potential side effects for all medications recommended or prescribed today. Discussed red flag signs and symptoms of worsening condition,when to call the PCP office, return to urgent care, and when to seek higher level of care in the emergency department. Patient verbalizes understanding and agreement with plan. All questions answered. Patient discharged in stable condition.  Final Clinical Impressions(s) / UC Diagnoses   Final diagnoses:  Pilonidal abscess     Discharge Instructions      Take doxycycline antibiotic twice daily for the next 10 days.  We have left your wound open so that it can continue to drain.  Gently massage the wound area while in the shower once daily to help the wound drain more.  Use warm compresses to the area.  Change the dressing twice a day with a nonstick gauze and tape.   You may take Tylenol and ibuprofen over-the-counter as needed for pain.  Return to urgent care if you experience any new or worsening symptoms.  If your symptoms are severe, please go to the emergency room.  I hope you feel better!    ED Prescriptions     Medication Sig Dispense Auth. Provider   doxycycline (VIBRAMYCIN) 100 MG capsule Take 1 capsule (100 mg total) by mouth 2 (two) times daily. 20 capsule Talbot Grumbling, FNP      PDMP not reviewed this encounter.   Talbot Grumbling, FNP 09/12/21 [place], [place], FNP 09/14/21 1935

## 2021-09-12 NOTE — ED Provider Notes (Incomplete Revision)
[place]    CSN: [id number] Arrival date & time: 09/12/21  1214      History   Chief Complaint Chief Complaint  Patient presents with   Abscess    HPI Nicole Browning is a 51 y.o. female.   Patient presents to urgent care for evaluation of her pilonidal abscess that has grown significantly in size over the last 3 days.  Patient states that she burned a paperclip edge and used that to puncture her abscess a few days ago and attempt to allow it to drain.  The abscesses began to drain only slightly but continues to grow in size and tenderness.  Patient states that it is difficult for her to sit comfortably due to the abscess and walk comfortably.  She frequently gets these abscesses in different areas of her body.  Drainage from abscess is white and thick.  Patient denies fever/chills, body aches, nausea, vomiting, abdominal pain, and difficulty defecating.  No urinary symptoms reported.  She has attempted use of Epsom salt soaks without relief of abscess.   Abscess   Past Medical History:  Diagnosis Date   Anemia    Diverticulitis 12/13/2020   GERD (gastroesophageal reflux disease)    IBS (irritable bowel syndrome)    Perirectal abscess    Protein-calorie malnutrition ([place]) 02/29/2016   Tobacco use    UC (ulcerative colitis) ([place])     Patient Active Problem List   Diagnosis Date Noted   Diverticulitis 12/13/2020   Smoking 11/22/2020   Liver abscess 10/17/2020   Medication monitoring encounter 10/17/2020   Streptococcal bacteremia    Intra-abdominal abscess ([place]) 10/03/2020   AKI (acute kidney injury) ([place]) 10/03/2020   Protein-calorie malnutrition, severe 09/28/2020   IDA (iron deficiency anemia) 07/24/2020   IBD (inflammatory bowel disease) 07/24/2020   Protein-calorie malnutrition ([place]) 02/29/2016   Elevated hemoglobin A1c 02/27/2016   GERD (gastroesophageal reflux disease)    Tobacco use    Anemia of chronic disease    Prediabetes    Perirectal  abscess 02/24/2016    Past Surgical History:  Procedure Laterality Date   BIOPSY  09/29/2020   Procedure: BIOPSY;  Surgeon: Jackquline Denmark, MD;  Location: [place];  Service: Endoscopy;;   ESOPHAGOGASTRODUODENOSCOPY (EGD) WITH PROPOFOL N/A 09/29/2020   Procedure: ESOPHAGOGASTRODUODENOSCOPY (EGD) WITH PROPOFOL;  Surgeon: Jackquline Denmark, MD;  Location: [facility] ENDOSCOPY;  Service: Endoscopy;  Laterality: N/A;   IR RADIOLOGIST EVAL & MGMT  10/17/2020   IRRIGATION AND DEBRIDEMENT ABSCESS N/A 02/24/2016   Procedure: IRRIGATION AND DEBRIDEMENT ABSCESS;  Surgeon: Stark Klein, MD;  Location: [place];  Service: General;  Laterality: N/A;    OB History   No obstetric history on file.      Home Medications    Prior to Admission medications   Medication Sig Start Date End Date Taking? Authorizing Provider  doxycycline (VIBRAMYCIN) 100 MG capsule Take 1 capsule (100 mg total) by mouth 2 (two) times daily. 09/12/21  Yes Talbot Grumbling, FNP  aspirin EC 81 MG tablet Take 1 tablet (81 mg total) by mouth daily with breakfast. 10/05/20 10/05/21  Roxan Hockey, MD  bismuth subsalicylate (PEPTO BISMOL) 262 MG/15ML suspension Take 30 mLs by mouth every 6 (six) hours as needed for indigestion.    [provider]  ibuprofen (ADVIL) 200 MG tablet Take 200 mg by mouth every 6 (six) hours as needed for moderate pain.    [provider]  mesalamine (LIALDA) 1.2 g EC tablet Take 1 tablet (1.2 g total)  by mouth in the morning and at bedtime. 07/11/20   Noralyn Pick, NP  Multiple Vitamins-Minerals (WOMENS MULTIVITAMIN PO) Take 1 tablet by mouth daily.    [provider]  pantoprazole (PROTONIX) 40 MG tablet Take 1 tablet (40 mg total) by mouth daily. 10/06/20   Roxan Hockey, MD  POTASSIUM PO Take 1 tablet by mouth daily.    [provider]  Vitamin D, Ergocalciferol, (DRISDOL) 1.25 MG (50000 UNIT) CAPS capsule Take 1 capsule (50,000 Units total) by mouth every 7  (seven) days. TAKE ONE CAPSULE BY MOUTH ONCE WEEKLY 07/22/20   Noralyn Pick, NP  VITAMIN E PO Take 1 tablet by mouth daily.    [provider]    Family History Family History  Problem Relation Age of Onset   Diabetes Mother    COPD Father    Leukemia Brother    Colon cancer Neg Hx    Colon polyps Neg Hx    Esophageal cancer Neg Hx    Rectal cancer Neg Hx    Stomach cancer Neg Hx     Social History Social History   Tobacco Use   Smoking status: Every Day    Packs/day: 0.33    Years: 28.00    Total pack years: 9.24    Types: Cigarettes   Smokeless tobacco: Never  Vaping Use   Vaping Use: Never used  Substance Use Topics   Alcohol use: Yes    Alcohol/week: 2.0 standard drinks of alcohol    Types: 2 Glasses of wine per week   Drug use: No     Allergies   Bactrim [sulfamethoxazole-trimethoprim], Percocet [oxycodone-acetaminophen], and Valium [diazepam]   Review of Systems Review of Systems   Physical Exam Triage Vital Signs ED Triage Vitals  Enc Vitals Group     BP 09/12/21 1402 107/75     Pulse Rate 09/12/21 1402 (!) 106     Resp 09/12/21 1402 18     Temp 09/12/21 1402 98.1 F (36.7 C)     Temp Source 09/12/21 1402 Oral     SpO2 09/12/21 1402 97 %     Weight --      Height --      Head Circumference --      Peak Flow --      Pain Score 09/12/21 1400 10     Pain Loc --      Pain Edu? --      Excl. in [place]? --    No data found.  Updated Vital Signs BP 107/75 (BP Location: Right Arm)   Pulse (!) 106   Temp 98.1 F (36.7 C) (Oral)   Resp 18   SpO2 97%   Visual Acuity Right Eye Distance:   Left Eye Distance:   Bilateral Distance:    Right Eye Near:   Left Eye Near:    Bilateral Near:     Physical Exam Vitals and nursing note reviewed. Exam conducted with a chaperone present Morey Hummingbird, Therapist, sports).  Constitutional:      Appearance: Normal appearance. She is not ill-appearing or toxic-appearing.     Comments: Very pleasant patient  sitting on exam in position of comfort table in no acute distress.   HENT:     Head: Normocephalic and atraumatic.     Right Ear: Hearing and external ear normal.     Left Ear: Hearing and external ear normal.     Nose: Nose normal.     Mouth/Throat:  Lips: Pink.     Mouth: Mucous membranes are moist.  Eyes:     General: Lids are normal. Vision grossly intact. Gaze aligned appropriately.     Extraocular Movements: Extraocular movements intact.     Conjunctiva/sclera: Conjunctivae normal.  Pulmonary:     Effort: Pulmonary effort is normal.  Abdominal:     Palpations: Abdomen is soft.  Genitourinary:    Exam position: Knee-chest position.       Comments: Large pilonidal cyst present.  Pilonidal abscess is approximately 2.5-3cm in diameter and is very fluctuant.  No swelling of the soft tissue surrounding the abscess present.  No erythema or warmth to the abscess.  There is a small area to the abscess that is draining at this time.  External hemorrhoids are also present to exam.  Hemorrhoids are not inflamed and not bleeding.  Musculoskeletal:     Cervical back: Neck supple.  Skin:    General: Skin is warm and dry.     Capillary Refill: Capillary refill takes less than 2 seconds.     Findings: Abscess present. No rash.  Neurological:     General: No focal deficit present.     Mental Status: She is alert and oriented to person, place, and time. Mental status is at baseline.     Cranial Nerves: No dysarthria or facial asymmetry.     Gait: Gait is intact.  Psychiatric:        Mood and Affect: Mood normal.        Speech: Speech normal.        Behavior: Behavior normal.        Thought Content: Thought content normal.        Judgment: Judgment normal.      UC Treatments / Results  Labs (all labs ordered are listed, but only abnormal results are displayed) Labs Reviewed - No data to display  EKG   Radiology No results found.  Procedures Incision and  Drainage  Date/Time: 09/12/2021 3:25 PM  Performed by: Talbot Grumbling, FNP Authorized by: Talbot Grumbling, FNP   Consent:    Consent obtained:  Verbal   Consent given by:  Patient   Risks, benefits, and alternatives were discussed: yes     Risks discussed:  Bleeding, incomplete drainage, pain, infection and damage to other organs Universal protocol:    Patient identity confirmed:  Verbally with patient Location:    Type:  Pilonidal cyst   Size:  2-3cm by 2-3cm   Location:  Anogenital   Anogenital location:  Pilonidal Pre-procedure details:    Skin preparation:  Povidone-iodine Sedation:    Sedation type:  None Anesthesia:    Anesthesia method:  Local infiltration   Local anesthetic:  Lidocaine 1% WITH epi Procedure type:    Complexity:  Simple Procedure details:    Incision types:  Stab incision   Incision depth:  Dermal   Wound management:  Irrigated with saline and probed and deloculated   Drainage:  Purulent and bloody   Drainage amount:  Copious   Wound treatment:  Wound left open   Packing materials:  None Post-procedure details:    Procedure completion:  Tolerated well, no immediate complications  (including critical care time)  Medications Ordered in UC Medications - No data to display  Initial Impression / Assessment and Plan / UC Course  I have reviewed the triage vital signs and the nursing notes.  Pertinent labs & imaging results that were available during my care of  the patient were reviewed by me and considered in my medical decision making (see chart for details).  1.  Pilonidal abscess Incision and drainage performed.  See procedure note above for further detail.  Wound cleansed and dressed in clinic with nonstick gauze and tape.  Patient to change dressing twice daily and monitor for worsening signs of infection.  Patient placed on doxycycline antibiotic twice daily for the next 10 days.  She may use Tylenol and ibuprofen over-the-counter  for pain as needed at home.  Encourage patient to massage the area gently at least once daily to express further infected material.  Wound left open to continue to drain.   Discussed physical exam and available lab work findings in clinic with patient.  Counseled patient regarding appropriate use of medications and potential side effects for all medications recommended or prescribed today. Discussed red flag signs and symptoms of worsening condition,when to call the PCP office, return to urgent care, and when to seek higher level of care in the emergency department. Patient verbalizes understanding and agreement with plan. All questions answered. Patient discharged in stable condition.  Final Clinical Impressions(s) / UC Diagnoses   Final diagnoses:  Pilonidal abscess     Discharge Instructions      Take doxycycline antibiotic twice daily for the next 10 days.  We have left your wound open so that it can continue to drain.  Gently massage the wound area while in the shower once daily to help the wound drain more.  Use warm compresses to the area.  Change the dressing twice a day with a nonstick gauze and tape.   You may take Tylenol and ibuprofen over-the-counter as needed for pain.  Return to urgent care if you experience any new or worsening symptoms.  If your symptoms are severe, please go to the emergency room.  I hope you feel better!    ED Prescriptions     Medication Sig Dispense Auth. Provider   doxycycline (VIBRAMYCIN) 100 MG capsule Take 1 capsule (100 mg total) by mouth 2 (two) times daily. 20 capsule Talbot Grumbling, FNP      PDMP not reviewed this encounter.   Talbot Grumbling, [place] 09/12/21 1528

## 2021-09-12 NOTE — Discharge Instructions (Addendum)
Take doxycycline antibiotic twice daily for the next 10 days.  We have left your wound open so that it can continue to drain.  Gently massage the wound area while in the shower once daily to help the wound drain more.  Use warm compresses to the area.  Change the dressing twice a day with a nonstick gauze and tape.   You may take Tylenol and ibuprofen over-the-counter as needed for pain.  Return to urgent care if you experience any new or worsening symptoms.  If your symptoms are severe, please go to the emergency room.  I hope you feel better!

## 2021-09-12 NOTE — ED Triage Notes (Signed)
Pt reports buttock abscess that tried draining herself. Had little bit to come out but now swollen back up

## 2022-02-21 ENCOUNTER — Ambulatory Visit (HOSPITAL_COMMUNITY): Payer: Self-pay

## 2022-02-28 ENCOUNTER — Ambulatory Visit: Payer: Self-pay

## 2022-02-28 ENCOUNTER — Ambulatory Visit
Admission: RE | Admit: 2022-02-28 | Discharge: 2022-02-28 | Disposition: A | Discharge status: Home / Self Care | Payer: Medicaid Other | Source: Ambulatory Visit | Attending: Emergency Medicine | Admitting: Emergency Medicine

## 2022-02-28 VITALS — BP 101/67 | HR 69 | Temp 99.1°F | Resp 17

## 2022-02-28 DIAGNOSIS — R051 Acute cough: Secondary | ICD-10-CM | POA: Diagnosis not present

## 2022-02-28 DIAGNOSIS — G9331 Postviral fatigue syndrome: Secondary | ICD-10-CM | POA: Diagnosis not present

## 2022-02-28 DIAGNOSIS — J329 Chronic sinusitis, unspecified: Secondary | ICD-10-CM

## 2022-02-28 MED ORDER — FLUTICASONE PROPIONATE 50 MCG/ACT NA SUSP: 1.0000 | Freq: Every day | NASAL | 2 refills | Status: DC

## 2022-02-28 MED ORDER — IBUPROFEN 400 MG PO TABS: 400.0000 mg | ORAL_TABLET | Freq: Three times a day (TID) | ORAL | 0 refills | Status: DC | PRN

## 2022-02-28 MED ORDER — AZITHROMYCIN 250 MG PO TABS: ORAL_TABLET | ORAL | 0 refills | Status: AC

## 2022-02-28 MED ORDER — CETIRIZINE HCL 10 MG PO TABS: 10.0000 mg | ORAL_TABLET | Freq: Every day | ORAL | 1 refills | Status: DC

## 2022-02-28 NOTE — Discharge Instructions (Signed)
Please read below to learn more about the medications, dosages and frequencies that I recommend to help alleviate your symptoms and to get you feeling better soon:   Z-Pak (azithromycin):  take 2 tablets the first day, then take 1 tablet daily every day thereafter until complete.       Zyrtec (cetirizine): This is an excellent second-generation antihistamine that helps to reduce respiratory inflammatory response to environmental allergens.  In some patients, this medication can cause daytime sleepiness so I recommend that you take 1 tablet daily at bedtime.     Flonase (fluticasone): This is a steroid nasal spray that you use once daily, 1 spray in each nare.  This medication does not work well if you decide to use it only used as you feel you need to, it works best used on a daily basis.  After 3 to 5 days of use, you will notice significant reduction of the inflammation and mucus production that is currently being caused by exposure to allergens, whether seasonal or environmental.  The most common side effect of this medication is nosebleeds.  If you experience a nosebleed, please discontinue use for 1 week, then feel free to resume.  I have provided you with a prescription.     Advil, Motrin (ibuprofen): This is a good anti-inflammatory medication which not only addresses aches, pains but also significantly reduces soft tissue inflammation of the upper airways that causes sinus and nasal congestion as well as inflammation of the lower airways which makes you feel like your breathing is constricted or your cough feel tight.  I recommend that you take 400 mg every 8 hours as needed.      Robitussin, Mucinex (guaifenesin): This is an expectorant.  This helps break up chest congestion and loosen up thick nasal drainage making phlegm and drainage more liquid and therefore easier to remove.  I recommend being 400 mg three times daily as needed.      Please follow-up within the next 5-7 days either with your  primary care provider or urgent care if your symptoms do not resolve.  If you do not have a primary care provider, we will assist you in finding one.        Thank you for visiting urgent care today.  We appreciate the opportunity to participate in your care.

## 2022-02-28 NOTE — ED Triage Notes (Signed)
Pt states she had the flu last week and now she had chest congestion, and fever.  Home interventions: Nyquil   Started: 2 weeks ago

## 2022-02-28 NOTE — ED Provider Notes (Signed)
[facility]    CSN: [id number] Arrival date & time: 02/28/22  1701    HISTORY   Chief Complaint  Patient presents with   Fever    Entered by patient   Nasal Congestion   HPI Nicole Browning is a pleasant, 51 y.o. female who presents to urgent care today. Pt states she had an influenza-like illness last week and now she has chest congestion, cough productive of dark brown sputum, rhinorrhea, tactile fever and chills.  Patient had a mildly elevated temperature on arrival today, vital signs are otherwise normal.  Patient states she is been taking NyQuil.  Patient states overall her symptoms have been persistent for the past 2 weeks.  Patient denies nausea, vomiting, diarrhea.  The history is provided by the patient.   Past Medical History:  Diagnosis Date   Anemia    Diverticulitis 12/13/2020   GERD (gastroesophageal reflux disease)    IBS (irritable bowel syndrome)    Perirectal abscess    Protein-calorie malnutrition (HCC) 02/29/2016   Tobacco use    UC (ulcerative colitis) (HCC)    Patient Active Problem List   Diagnosis Date Noted   Diverticulitis 12/13/2020   Smoking 11/22/2020   Liver abscess 10/17/2020   Medication monitoring encounter 10/17/2020   Streptococcal bacteremia    Intra-abdominal abscess (HCC) 10/03/2020   AKI (acute kidney injury) (HCC) 10/03/2020   Protein-calorie malnutrition, severe 09/28/2020   IDA (iron deficiency anemia) 07/24/2020   IBD (inflammatory bowel disease) 07/24/2020   Protein-calorie malnutrition (HCC) 02/29/2016   Elevated hemoglobin A1c 02/27/2016   GERD (gastroesophageal reflux disease)    Tobacco use    Anemia of chronic disease    Prediabetes    Perirectal abscess 02/24/2016   Past Surgical History:  Procedure Laterality Date   BIOPSY  09/29/2020   Procedure: BIOPSY;  Surgeon: Lynann Bologna, MD;  Location: [facility] ENDOSCOPY;  Service: Endoscopy;;   ESOPHAGOGASTRODUODENOSCOPY (EGD) WITH PROPOFOL N/A 09/29/2020    Procedure: ESOPHAGOGASTRODUODENOSCOPY (EGD) WITH PROPOFOL;  Surgeon: Lynann Bologna, MD;  Location: [facility] ENDOSCOPY;  Service: Endoscopy;  Laterality: N/A;   IR RADIOLOGIST EVAL & MGMT  10/17/2020   IRRIGATION AND DEBRIDEMENT ABSCESS N/A 02/24/2016   Procedure: IRRIGATION AND DEBRIDEMENT ABSCESS;  Surgeon: Almond Lint, MD;  Location: [facility] OR;  Service: General;  Laterality: N/A;   OB History   No obstetric history on file.    Home Medications    Prior to Admission medications   Medication Sig Start Date End Date Taking? Authorizing Provider  bismuth subsalicylate (PEPTO BISMOL) 262 MG/15ML suspension Take 30 mLs by mouth every 6 (six) hours as needed for indigestion.    [provider]  doxycycline (VIBRAMYCIN) 100 MG capsule Take 1 capsule (100 mg total) by mouth 2 (two) times daily. 09/12/21   Carlisle Beers, FNP  ibuprofen (ADVIL) 200 MG tablet Take 200 mg by mouth every 6 (six) hours as needed for moderate pain.    [provider]  mesalamine (LIALDA) 1.2 g EC tablet Take 1 tablet (1.2 g total) by mouth in the morning and at bedtime. 07/11/20   Arnaldo Natal, NP  Multiple Vitamins-Minerals (WOMENS MULTIVITAMIN PO) Take 1 tablet by mouth daily.    [provider]  pantoprazole (PROTONIX) 40 MG tablet Take 1 tablet (40 mg total) by mouth daily. 10/06/20   Shon Hale, MD  POTASSIUM PO Take 1 tablet by mouth daily.    [provider]  Vitamin D, Ergocalciferol, (DRISDOL) 1.25 MG (50000 UNIT) CAPS  capsule Take 1 capsule (50,000 Units total) by mouth every 7 (seven) days. TAKE ONE CAPSULE BY MOUTH ONCE WEEKLY 07/22/20   Arnaldo Natal, NP  VITAMIN E PO Take 1 tablet by mouth daily.    [provider]    Family History Family History  Problem Relation Age of Onset   Diabetes Mother    COPD Father    Leukemia Brother    Colon cancer Neg Hx    Colon polyps Neg Hx    Esophageal cancer Neg Hx    Rectal cancer Neg Hx     Stomach cancer Neg Hx    Social History Social History   Tobacco Use   Smoking status: Every Day    Packs/day: 0.33    Years: 28.00    Total pack years: 9.24    Types: Cigarettes   Smokeless tobacco: Never  Vaping Use   Vaping Use: Never used  Substance Use Topics   Alcohol use: Yes    Alcohol/week: 2.0 standard drinks of alcohol    Types: 2 Glasses of wine per week   Drug use: No   Allergies   Bactrim [sulfamethoxazole-trimethoprim], Percocet [oxycodone-acetaminophen], and Valium [diazepam]  Review of Systems Review of Systems Pertinent findings revealed after performing a 14 point review of systems has been noted in the history of present illness.  Physical Exam Triage Vital Signs ED Triage Vitals  Enc Vitals Group     BP 12/28/20 0827 (!) 147/82     Pulse Rate 12/28/20 0827 72     Resp 12/28/20 0827 18     Temp 12/28/20 0827 98.3 F (36.8 C)     Temp Source 12/28/20 0827 Oral     SpO2 12/28/20 0827 98 %     Weight --      Height --      Head Circumference --      Peak Flow --      Pain Score 12/28/20 0826 5     Pain Loc --      Pain Edu? --      Excl. in GC? --   No data found.  Updated Vital Signs BP 101/67 (BP Location: Right Arm)   Pulse 69   Temp 99.1 F (37.3 C) (Oral)   Resp 17   SpO2 96%   Physical Exam Vitals and nursing note reviewed.  Constitutional:      General: She is not in acute distress.    Appearance: Normal appearance. She is not ill-appearing.  HENT:     Head: Normocephalic and atraumatic.     Salivary Glands: Right salivary gland is not diffusely enlarged or tender. Left salivary gland is not diffusely enlarged or tender.     Right Ear: Hearing, ear canal and external ear normal. No drainage. A middle ear effusion is present. There is no impacted cerumen. Tympanic membrane is bulging. Tympanic membrane is not injected or erythematous.     Left Ear: Hearing, ear canal and external ear normal. No drainage. A middle ear effusion is  present. There is no impacted cerumen. Tympanic membrane is bulging. Tympanic membrane is not injected or erythematous.     Ears:     Comments: Bilateral EACs normal, both TMs bulging with slightly purulent fluid    Nose: Rhinorrhea present. No nasal deformity, septal deviation, signs of injury, nasal tenderness, mucosal edema or congestion. Rhinorrhea is clear.     Right Nostril: No foreign body, epistaxis, septal hematoma or occlusion.  Left Nostril: Occlusion present. No foreign body, epistaxis or septal hematoma.     Right Turbinates: Enlarged and swollen. Not pale.     Left Turbinates: Enlarged and swollen. Not pale.     Right Sinus: No maxillary sinus tenderness or frontal sinus tenderness.     Left Sinus: No maxillary sinus tenderness or frontal sinus tenderness.     Mouth/Throat:     Lips: Pink. No lesions.     Mouth: Mucous membranes are moist. No oral lesions.     Tongue: No lesions. Tongue does not deviate from midline.     Palate: No mass and lesions.     Pharynx: Oropharynx is clear. Uvula midline. No pharyngeal swelling, oropharyngeal exudate, posterior oropharyngeal erythema or uvula swelling.     Tonsils: No tonsillar exudate. 0 on the right. 0 on the left.  Eyes:     General: Lids are normal.        Right eye: No discharge.        Left eye: No discharge.     Extraocular Movements: Extraocular movements intact.     Conjunctiva/sclera: Conjunctivae normal.     Right eye: Right conjunctiva is not injected.     Left eye: Left conjunctiva is not injected.  Neck:     Trachea: Trachea and phonation normal.  Cardiovascular:     Rate and Rhythm: Regular rhythm.     Pulses: Normal pulses.     Heart sounds: Normal heart sounds. No murmur heard.    No friction rub. No gallop.  Pulmonary:     Effort: Pulmonary effort is normal. No accessory muscle usage, prolonged expiration or respiratory distress.     Breath sounds: Normal breath sounds. No stridor, decreased air movement  or transmitted upper airway sounds. No decreased breath sounds, wheezing, rhonchi or rales.  Chest:     Chest wall: No tenderness.  Musculoskeletal:        General: Normal range of motion.     Cervical back: Normal range of motion and neck supple. Normal range of motion.  Lymphadenopathy:     Cervical: No cervical adenopathy.  Skin:    General: Skin is warm and dry.     Findings: No erythema or rash.  Neurological:     General: No focal deficit present.     Mental Status: She is alert and oriented to person, place, and time.  Psychiatric:        Mood and Affect: Mood normal.        Behavior: Behavior normal.     Visual Acuity Right Eye Distance:   Left Eye Distance:   Bilateral Distance:    Right Eye Near:   Left Eye Near:    Bilateral Near:     UC Couse / Diagnostics / Procedures:     Radiology No results found.  Procedures Procedures (including critical care time) EKG  Pending results:  Labs Reviewed - No data to display  Medications Ordered in UC: Medications - No data to display  UC Diagnoses / Final Clinical Impressions(s)   I have reviewed the triage vital signs and the nursing notes.  Pertinent labs & imaging results that were available during my care of the patient were reviewed by me and considered in my medical decision making (see chart for details).    Final diagnoses:  Post viral syndrome  Acute cough  Rhinosinusitis   Given duration of symptoms, will treat patient empirically for presumed secondary bacterial lower respiratory tract infection with azithromycin.  Patient provided with supportive medications to alleviate nasal congestion, rhinorrhea and postnasal drip.  Patient provided with ibuprofen to alleviate respiratory inflammation. Please see discharge instructions below for further details of plan of care as provided to patient. ED Prescriptions     Medication Sig Dispense Auth. Provider   azithromycin (ZITHROMAX) 250 MG tablet Take 2  tablets (500 mg total) by mouth daily for 1 day, THEN 1 tablet (250 mg total) daily for 4 days. 6 tablet Theadora Rama Scales, PA-C   cetirizine (ZYRTEC ALLERGY) 10 MG tablet Take 1 tablet (10 mg total) by mouth at bedtime. 90 tablet Theadora Rama Scales, PA-C   fluticasone (FLONASE) 50 MCG/ACT nasal spray Place 1 spray into both nostrils daily. Begin by using 2 sprays in each nare daily for 3 to 5 days, then decrease to 1 spray in each nare daily. 15.8 mL Theadora Rama Scales, PA-C   ibuprofen (ADVIL) 400 MG tablet Take 1 tablet (400 mg total) by mouth every 8 (eight) hours as needed for up to 30 doses. 30 tablet Theadora Rama Scales, PA-C      PDMP not reviewed this encounter.  Disposition Upon Discharge:  Condition: stable for discharge home Home: take medications as prescribed; routine discharge instructions as discussed; follow up as advised.  Patient presented with an acute illness with associated systemic symptoms and significant discomfort requiring urgent management. In my opinion, this is a condition that a prudent lay person (someone who possesses an average knowledge of health and medicine) may potentially expect to result in complications if not addressed urgently such as respiratory distress, impairment of bodily function or dysfunction of bodily organs.   Routine symptom specific, illness specific and/or disease specific instructions were discussed with the patient and/or caregiver at length.   As such, the patient has been evaluated and assessed, work-up was performed and treatment was provided in alignment with urgent care protocols and evidence based medicine.  Patient/parent/caregiver has been advised that the patient may require follow up for further testing and treatment if the symptoms continue in spite of treatment, as clinically indicated and appropriate.  If the patient was tested for COVID-19, Influenza and/or RSV, then the patient/parent/guardian was advised to  isolate at home pending the results of his/her diagnostic coronavirus test and potentially longer if they're positive. I have also advised pt that if his/her COVID-19 test returns positive, it's recommended to self-isolate for at least 10 days after symptoms first appeared AND until fever-free for 24 hours without fever reducer AND other symptoms have improved or resolved. Discussed self-isolation recommendations as well as instructions for household member/close contacts as per the [facility] and [place] [facility], and also gave patient the COVID packet with this information.  Patient/parent/caregiver has been advised to return to the [facility] or PCP in 3-5 days if no better; to PCP or the Emergency Department if new signs and symptoms develop, or if the current signs or symptoms continue to change or worsen for further workup, evaluation and treatment as clinically indicated and appropriate  The patient will follow up with their current PCP if and as advised. If the patient does not currently have a PCP we will assist them in obtaining one.   The patient may need specialty follow up if the symptoms continue, in spite of conservative treatment and management, for further workup, evaluation, consultation and treatment as clinically indicated and appropriate.  Patient/parent/caregiver verbalized understanding and agreement of plan as discussed.  All questions were addressed during visit.  Please see discharge  instructions below for further details of plan.  Discharge Instructions:   Discharge Instructions      Please read below to learn more about the medications, dosages and frequencies that I recommend to help alleviate your symptoms and to get you feeling better soon:   Z-Pak (azithromycin):  take 2 tablets the first day, then take 1 tablet daily every day thereafter until complete.       Zyrtec (cetirizine): This is an excellent second-generation antihistamine that helps to reduce respiratory inflammatory response  to environmental allergens.  In some patients, this medication can cause daytime sleepiness so I recommend that you take 1 tablet daily at bedtime.     Flonase (fluticasone): This is a steroid nasal spray that you use once daily, 1 spray in each nare.  This medication does not work well if you decide to use it only used as you feel you need to, it works best used on a daily basis.  After 3 to 5 days of use, you will notice significant reduction of the inflammation and mucus production that is currently being caused by exposure to allergens, whether seasonal or environmental.  The most common side effect of this medication is nosebleeds.  If you experience a nosebleed, please discontinue use for 1 week, then feel free to resume.  I have provided you with a prescription.     Advil, Motrin (ibuprofen): This is a good anti-inflammatory medication which not only addresses aches, pains but also significantly reduces soft tissue inflammation of the upper airways that causes sinus and nasal congestion as well as inflammation of the lower airways which makes you feel like your breathing is constricted or your cough feel tight.  I recommend that you take 400 mg every 8 hours as needed.      Robitussin, Mucinex (guaifenesin): This is an expectorant.  This helps break up chest congestion and loosen up thick nasal drainage making phlegm and drainage more liquid and therefore easier to remove.  I recommend being 400 mg three times daily as needed.      Please follow-up within the next 5-7 days either with your primary care provider or urgent care if your symptoms do not resolve.  If you do not have a primary care provider, we will assist you in finding one.        Thank you for visiting urgent care today.  We appreciate the opportunity to participate in your care.       This office note has been dictated using Teaching laboratory technician.  Unfortunately, this method of dictation can sometimes lead to  typographical or grammatical errors.  I apologize for your inconvenience in advance if this occurs.  Please do not hesitate to reach out to me if clarification is needed.      Theadora Rama Scales, [place] 03/02/22 [phone]

## 2022-06-06 ENCOUNTER — Ambulatory Visit (HOSPITAL_COMMUNITY)
Admission: RE | Admit: 2022-06-06 | Discharge: 2022-06-06 | Disposition: A | Discharge status: Home / Self Care | Payer: Medicaid Other | Source: Ambulatory Visit | Attending: Physician Assistant

## 2022-06-06 ENCOUNTER — Encounter (HOSPITAL_COMMUNITY): Payer: Self-pay

## 2022-06-06 VITALS — BP 120/84 | HR 94 | Temp 98.7°F | Resp 16

## 2022-06-06 DIAGNOSIS — N611 Abscess of the breast and nipple: Secondary | ICD-10-CM | POA: Diagnosis not present

## 2022-06-06 MED ORDER — DOXYCYCLINE HYCLATE 100 MG PO CAPS: 100.0000 mg | ORAL_CAPSULE | Freq: Two times a day (BID) | ORAL | 0 refills | Status: AC

## 2022-06-06 MED ORDER — TRAMADOL HCL 50 MG PO TABS: 50.0000 mg | ORAL_TABLET | Freq: Four times a day (QID) | ORAL | 0 refills | Status: AC | PRN

## 2022-06-06 NOTE — Discharge Instructions (Signed)
Take antibiotic as prescribed Continue with warm compress If pain, swelling, and redness increases while on antibiotic return for drainage.  Keep upcoming appointment with Primary Care Physician.

## 2022-06-06 NOTE — ED Triage Notes (Signed)
Pt states left breast pain with a lump and drainage for one week.

## 2022-06-06 NOTE — ED Provider Notes (Signed)
[facility]    CSN: [id number] Arrival date & time: 06/06/22  1906      History   Chief Complaint Chief Complaint  Patient presents with   Breast Problem    Breast been hurting for about a week now - Entered by patient    HPI Nicole Browning is a 52 y.o. female.   HPI  Past Medical History:  Diagnosis Date   Anemia    Diverticulitis 12/13/2020   GERD (gastroesophageal reflux disease)    IBS (irritable bowel syndrome)    Perirectal abscess    Protein-calorie malnutrition 02/29/2016   Tobacco use    UC (ulcerative colitis)     Patient Active Problem List   Diagnosis Date Noted   Diverticulitis 12/13/2020   Smoking 11/22/2020   Liver abscess 10/17/2020   Medication monitoring encounter 10/17/2020   Streptococcal bacteremia    Intra-abdominal abscess 10/03/2020   AKI (acute kidney injury) 10/03/2020   Protein-calorie malnutrition, severe 09/28/2020   IDA (iron deficiency anemia) 07/24/2020   IBD (inflammatory bowel disease) 07/24/2020   Protein-calorie malnutrition 02/29/2016   Elevated hemoglobin A1c 02/27/2016   GERD (gastroesophageal reflux disease)    Tobacco use    Anemia of chronic disease    Prediabetes    Perirectal abscess 02/24/2016    Past Surgical History:  Procedure Laterality Date   BIOPSY  09/29/2020   Procedure: BIOPSY;  Surgeon: Lynann BolognaGupta, Rajesh, MD;  [facility];  Service: Endoscopy;;   ESOPHAGOGASTRODUODENOSCOPY (EGD) WITH PROPOFOL N/A 09/29/2020   Procedure: ESOPHAGOGASTRODUODENOSCOPY (EGD) WITH PROPOFOL;  Surgeon: Lynann BolognaGupta, Rajesh, MD;  [facility];  Service: Endoscopy;  Laterality: N/A;   IR RADIOLOGIST EVAL & MGMT  10/17/2020   IRRIGATION AND DEBRIDEMENT ABSCESS N/A 02/24/2016   Procedure: IRRIGATION AND DEBRIDEMENT ABSCESS;  Surgeon: Almond LintFaera Byerly, MD;  Location: [facility] OR;  Service: General;  Laterality: N/A;    OB History   No obstetric history on file.      Home Medications    Prior to Admission  medications   Medication Sig Start Date End Date Taking? Authorizing Provider  doxycycline (VIBRAMYCIN) 100 MG capsule Take 1 capsule (100 mg total) by mouth 2 (two) times daily for 7 days. 06/06/22 06/13/22 Yes Ward, Tylene FantasiaJessica Z, PA-C  traMADol (ULTRAM) 50 MG tablet Take 1 tablet (50 mg total) by mouth every 6 (six) hours as needed for up to 3 days. 06/06/22 06/09/22 Yes Ward, Tylene FantasiaJessica Z, PA-C  bismuth subsalicylate (PEPTO BISMOL) 262 MG/15ML suspension Take 30 mLs by mouth every 6 (six) hours as needed for indigestion.    [provider]  cetirizine (ZYRTEC ALLERGY) 10 MG tablet Take 1 tablet (10 mg total) by mouth at bedtime. 02/28/22 08/27/22  Theadora RamaMorgan, Lindsay Scales, PA-C  fluticasone (FLONASE) 50 MCG/ACT nasal spray Place 1 spray into both nostrils daily. Begin by using 2 sprays in each nare daily for 3 to 5 days, then decrease to 1 spray in each nare daily. 02/28/22   Theadora RamaMorgan, Lindsay Scales, PA-C  ibuprofen (ADVIL) 400 MG tablet Take 1 tablet (400 mg total) by mouth every 8 (eight) hours as needed for up to 30 doses. 02/28/22   Theadora RamaMorgan, Lindsay Scales, PA-C  mesalamine (LIALDA) 1.2 g EC tablet Take 1 tablet (1.2 g total) by mouth in the morning and at bedtime. 07/11/20   Arnaldo NatalKennedy-Smith, Colleen M, NP  Multiple Vitamins-Minerals (WOMENS MULTIVITAMIN PO) Take 1 tablet by mouth daily.    [provider]  pantoprazole (PROTONIX) 40 MG tablet Take 1  tablet (40 mg total) by mouth daily. 10/06/20   Shon Hale, MD  POTASSIUM PO Take 1 tablet by mouth daily.    [provider]  Vitamin D, Ergocalciferol, (DRISDOL) 1.25 MG (50000 UNIT) CAPS capsule Take 1 capsule (50,000 Units total) by mouth every 7 (seven) days. TAKE ONE CAPSULE BY MOUTH ONCE WEEKLY 07/22/20   Arnaldo Natal, NP  VITAMIN E PO Take 1 tablet by mouth daily.    [provider]    Family History Family History  Problem Relation Age of Onset   Diabetes Mother    COPD Father    Leukemia Brother     Colon cancer Neg Hx    Colon polyps Neg Hx    Esophageal cancer Neg Hx    Rectal cancer Neg Hx    Stomach cancer Neg Hx     Social History Social History   Tobacco Use   Smoking status: Every Day    Packs/day: 0.33    Years: 28.00    Additional pack years: 0.00    Total pack years: 9.24    Types: Cigarettes   Smokeless tobacco: Never  Vaping Use   Vaping Use: Never used  Substance Use Topics   Alcohol use: Yes    Alcohol/week: 2.0 standard drinks of alcohol    Types: 2 Glasses of wine per week   Drug use: No     Allergies   Bactrim [sulfamethoxazole-trimethoprim], Percocet [oxycodone-acetaminophen], and Valium [diazepam]   Review of Systems Review of Systems   Physical Exam Triage Vital Signs ED Triage Vitals  Enc Vitals Group     BP 06/06/22 1917 120/84     Pulse Rate 06/06/22 1917 94     Resp 06/06/22 1917 16     Temp 06/06/22 1917 98.7 F (37.1 C)     Temp Source 06/06/22 1917 Oral     SpO2 06/06/22 1917 99 %     Weight --      Height --      Head Circumference --      Peak Flow --      Pain Score 06/06/22 1918 9     Pain Loc --      Pain Edu? --      Excl. in GC? --    No data found.  Updated Vital Signs BP 120/84 (BP Location: Right Arm)   Pulse 94   Temp 98.7 F (37.1 C) (Oral)   Resp 16   SpO2 99%   Visual Acuity Right Eye Distance:   Left Eye Distance:   Bilateral Distance:    Right Eye Near:   Left Eye Near:    Bilateral Near:     Physical Exam   UC Treatments / Results  Labs (all labs ordered are listed, but only abnormal results are displayed) Labs Reviewed - No data to display  EKG   Radiology No results found.  Procedures Procedures (including critical care time)  Medications Ordered in UC Medications - No data to display  Initial Impression / Assessment and Plan / UC Course  I have reviewed the triage vital signs and the nursing notes.  Pertinent labs & imaging results that were available during my care of  the patient were reviewed by me and considered in my medical decision making (see chart for details).     *** Final Clinical Impressions(s) / UC Diagnoses   Final diagnoses:  Abscess of breast     Discharge Instructions  Take antibiotic as prescribed Continue with warm compress If pain, swelling, and redness increases while on antibiotic return for drainage.  Keep upcoming appointment with Primary Care Physician.    ED Prescriptions     Medication Sig Dispense Auth. Provider   doxycycline (VIBRAMYCIN) 100 MG capsule Take 1 capsule (100 mg total) by mouth 2 (two) times daily for 7 days. 14 capsule Ward, Shanda Bumps Z, PA-C   traMADol (ULTRAM) 50 MG tablet Take 1 tablet (50 mg total) by mouth every 6 (six) hours as needed for up to 3 days. 12 tablet Ward, Tylene Fantasia, PA-C      I have reviewed the PDMP during this encounter.

## 2022-06-12 ENCOUNTER — Encounter (HOSPITAL_COMMUNITY): Payer: Self-pay

## 2022-06-12 ENCOUNTER — Ambulatory Visit (HOSPITAL_COMMUNITY)
Admission: EM | Admit: 2022-06-12 | Discharge: 2022-06-12 | Disposition: A | Discharge status: Home / Self Care | Payer: Medicaid Other | Attending: Physician Assistant | Admitting: Physician Assistant

## 2022-06-12 DIAGNOSIS — N611 Abscess of the breast and nipple: Secondary | ICD-10-CM

## 2022-06-12 MED ORDER — CEPHALEXIN 500 MG PO CAPS
500.0000 mg | ORAL_CAPSULE | Freq: Four times a day (QID) | ORAL | 0 refills | Status: DC
Start: 2022-06-12 — End: 2022-11-03

## 2022-06-12 MED ORDER — TRAMADOL HCL 50 MG PO TABS: 50.0000 mg | ORAL_TABLET | Freq: Three times a day (TID) | ORAL | 0 refills | Status: AC | PRN

## 2022-06-12 NOTE — Discharge Instructions (Signed)
We drained approximately 14 mL of pus from your breast.  It is very important that you follow-up with the surgeon.  Please call them to schedule an appointment first thing tomorrow.  I have placed orders for a mammogram and ultrasound.  Someone should contact you to schedule this appointment if you do not hear from them by next week please contact me.  In addition to doxycycline, start cephalexin 500 mg 4 times daily.  I have called in a refill of your tramadol.  This will make you sedated so do not drive or drink alcohol with taking it.  As we discussed, if you have trouble getting into see a surgeon, enlarging lesion, increased pain, fever, nausea, vomiting you need to go to the emergency room immediately.

## 2022-06-12 NOTE — ED Triage Notes (Signed)
Pt presents to the office for follow/up on breast abscess. Pt stated the medication has not help the abscess.

## 2022-06-12 NOTE — ED Provider Notes (Signed)
[facility]    CSN: [id number] Arrival date & time: 06/12/22  1709      History   Chief Complaint Chief Complaint  Patient presents with   Abscess    HPI Nicole Browning is a 52 y.o. female.   Patient presents today with a several week history of breast abscess.  She was seen by our clinic on 06/06/2022 at which point she was started on doxycycline.  She has been taking this medication without significant side effects.  She was also prescribed tramadol and reports that she has been taking this regularly because the pain has become more intense.  It is currently rated 10 on a 0-10 pain scale, described as sharp, worse with palpation or manipulation of the breast, no alleviating factors identified.  She reports having a mammogram approximately 2 years ago that was normal.  Denies any family history of breast cancer.  She did contact the breast center and is scheduled to have a screening mammogram in May but was unable to get an appointment sooner.  Denies any nipple discharge or inversion.  Denies additional antibiotics in the past 90 days.  Does report a history of recurrent skin abscesses that have often required drainage.    Past Medical History:  Diagnosis Date   Anemia    Diverticulitis 12/13/2020   GERD (gastroesophageal reflux disease)    IBS (irritable bowel syndrome)    Perirectal abscess    Protein-calorie malnutrition 02/29/2016   Tobacco use    UC (ulcerative colitis)     Patient Active Problem List   Diagnosis Date Noted   Diverticulitis 12/13/2020   Smoking 11/22/2020   Liver abscess 10/17/2020   Medication monitoring encounter 10/17/2020   Streptococcal bacteremia    Intra-abdominal abscess 10/03/2020   AKI (acute kidney injury) 10/03/2020   Protein-calorie malnutrition, severe 09/28/2020   IDA (iron deficiency anemia) 07/24/2020   IBD (inflammatory bowel disease) 07/24/2020   Protein-calorie malnutrition 02/29/2016   Elevated hemoglobin A1c  02/27/2016   GERD (gastroesophageal reflux disease)    Tobacco use    Anemia of chronic disease    Prediabetes    Perirectal abscess 02/24/2016    Past Surgical History:  Procedure Laterality Date   BIOPSY  09/29/2020   Procedure: BIOPSY;  Surgeon: Lynann Bologna, MD;  Location: [facility] ENDOSCOPY;  Service: Endoscopy;;   ESOPHAGOGASTRODUODENOSCOPY (EGD) WITH PROPOFOL N/A 09/29/2020   Procedure: ESOPHAGOGASTRODUODENOSCOPY (EGD) WITH PROPOFOL;  Surgeon: Lynann Bologna, MD;  Location: [facility] ENDOSCOPY;  Service: Endoscopy;  Laterality: N/A;   IR RADIOLOGIST EVAL & MGMT  10/17/2020   IRRIGATION AND DEBRIDEMENT ABSCESS N/A 02/24/2016   Procedure: IRRIGATION AND DEBRIDEMENT ABSCESS;  Surgeon: Almond Lint, MD;  Location: [facility] OR;  Service: General;  Laterality: N/A;    OB History   No obstetric history on file.      Home Medications    Prior to Admission medications   Medication Sig Start Date End Date Taking? Authorizing Provider  cephALEXin (KEFLEX) 500 MG capsule Take 1 capsule (500 mg total) by mouth 4 (four) times daily. 06/12/22  Yes Deborrah Mabin K, PA-C  traMADol (ULTRAM) 50 MG tablet Take 1 tablet (50 mg total) by mouth every 8 (eight) hours as needed for up to 3 days. 06/12/22 06/15/22 Yes Halley Shepheard K, PA-C  bismuth subsalicylate (PEPTO BISMOL) 262 MG/15ML suspension Take 30 mLs by mouth every 6 (six) hours as needed for indigestion.    [provider]  cetirizine (ZYRTEC ALLERGY) 10 MG tablet Take 1  tablet (10 mg total) by mouth at bedtime. 02/28/22 08/27/22  Theadora RamaMorgan, Lindsay Scales, PA-C  doxycycline (VIBRAMYCIN) 100 MG capsule Take 1 capsule (100 mg total) by mouth 2 (two) times daily for 7 days. 06/06/22 06/13/22  Ward, Tylene FantasiaJessica Z, PA-C  fluticasone (FLONASE) 50 MCG/ACT nasal spray Place 1 spray into both nostrils daily. Begin by using 2 sprays in each nare daily for 3 to 5 days, then decrease to 1 spray in each nare daily. 02/28/22   Theadora RamaMorgan, Lindsay Scales, PA-C  ibuprofen (ADVIL) 400  MG tablet Take 1 tablet (400 mg total) by mouth every 8 (eight) hours as needed for up to 30 doses. 02/28/22   Theadora RamaMorgan, Lindsay Scales, PA-C  mesalamine (LIALDA) 1.2 g EC tablet Take 1 tablet (1.2 g total) by mouth in the morning and at bedtime. 07/11/20   Arnaldo NatalKennedy-Smith, Colleen M, NP  Multiple Vitamins-Minerals (WOMENS MULTIVITAMIN PO) Take 1 tablet by mouth daily.    [provider]  pantoprazole (PROTONIX) 40 MG tablet Take 1 tablet (40 mg total) by mouth daily. 10/06/20   Shon HaleEmokpae, Courage, MD  POTASSIUM PO Take 1 tablet by mouth daily.    [provider]  Vitamin D, Ergocalciferol, (DRISDOL) 1.25 MG (50000 UNIT) CAPS capsule Take 1 capsule (50,000 Units total) by mouth every 7 (seven) days. TAKE ONE CAPSULE BY MOUTH ONCE WEEKLY 07/22/20   Arnaldo NatalKennedy-Smith, Colleen M, NP  VITAMIN E PO Take 1 tablet by mouth daily.    [provider]    Family History Family History  Problem Relation Age of Onset   Diabetes Mother    COPD Father    Leukemia Brother    Colon cancer Neg Hx    Colon polyps Neg Hx    Esophageal cancer Neg Hx    Rectal cancer Neg Hx    Stomach cancer Neg Hx     Social History Social History   Tobacco Use   Smoking status: Every Day    Packs/day: 0.33    Years: 28.00    Additional pack years: 0.00    Total pack years: 9.24    Types: Cigarettes   Smokeless tobacco: Never  Vaping Use   Vaping Use: Never used  Substance Use Topics   Alcohol use: Yes    Alcohol/week: 2.0 standard drinks of alcohol    Types: 2 Glasses of wine per week   Drug use: No     Allergies   Bactrim [sulfamethoxazole-trimethoprim], Percocet [oxycodone-acetaminophen], and Valium [diazepam]   Review of Systems Review of Systems  Constitutional:  Negative for activity change, appetite change, fatigue and fever.  Gastrointestinal:  Negative for abdominal pain, diarrhea, nausea and vomiting.  Musculoskeletal:  Negative for arthralgias and myalgias.  Skin:  Positive for  color change and wound.     Physical Exam Triage Vital Signs ED Triage Vitals  Enc Vitals Group     BP 06/12/22 1848 116/84     Pulse Rate 06/12/22 1848 78     Resp 06/12/22 1848 18     Temp 06/12/22 1848 98.1 F (36.7 C)     Temp Source 06/12/22 1848 Oral     SpO2 06/12/22 1848 98 %     Weight --      Height --      Head Circumference --      Peak Flow --      Pain Score 06/12/22 1846 10     Pain Loc --      Pain Edu? --  Excl. in GC? --    No data found.  Updated Vital Signs BP 116/84 (BP Location: Left Arm)   Pulse 78   Temp 98.1 F (36.7 C) (Oral)   Resp 18   SpO2 98%   Visual Acuity Right Eye Distance:   Left Eye Distance:   Bilateral Distance:    Right Eye Near:   Left Eye Near:    Bilateral Near:     Physical Exam Vitals reviewed.  Constitutional:      General: She is awake. She is not in acute distress.    Appearance: Normal appearance. She is well-developed. She is not ill-appearing.     Comments: Very pleasant female presented age in no acute distress sitting comfortably in exam room  HENT:     Head: Normocephalic and atraumatic.  Cardiovascular:     Rate and Rhythm: Normal rate and regular rhythm.     Heart sounds: Normal heart sounds, S1 normal and S2 normal. No murmur heard. Pulmonary:     Effort: Pulmonary effort is normal.     Breath sounds: Normal breath sounds. No wheezing, rhonchi or rales.     Comments: Clear to auscultation bilaterally Chest:  Breasts:    Left: Swelling, mass and skin change present. No inverted nipple.       Comments: 2 cm x 2 cm fluctuant nodule involving superior areola with overlying erythema.  No streaking or evidence of lymphangitis.  Area is tender to palpation. Abdominal:     Palpations: Abdomen is soft.     Tenderness: There is no abdominal tenderness.  Psychiatric:        Behavior: Behavior is cooperative.      UC Treatments / Results  Labs (all labs ordered are listed, but only abnormal  results are displayed) Labs Reviewed - No data to display  EKG   Radiology No results found.  Procedures Procedures (including critical care time)  Medications Ordered in UC Medications - No data to display  Initial Impression / Assessment and Plan / UC Course  I have reviewed the triage vital signs and the nursing notes.  Pertinent labs & imaging results that were available during my care of the patient were reviewed by me and considered in my medical decision making (see chart for details).     Patient is well-appearing, afebrile, nontoxic, nontachycardic.  She does have a significant abscess on her nipple.  Consult Dr. Tracie Harrier to discuss treatment options and ultimately decided on aspiration given its location.  Area was cleaned with alcohol and then topical freeze spray was used to anesthetize the skin.  An 18-gauge needle was introduced in the central fluctuance and a total of 14 mL of purulent drainage was removed from the lesion.  Hemostasis was achieved with direct pressure.  Patient did report improvement of pain but not resolution.  She was able to tolerate procedure without complication.  Discussed that ultimately she will need to be followed by a surgeon as she will likely need additional intervention.  She was given contact information for local provider with instruction to call to schedule appointment tomorrow.  Stat orders for mammogram and ultrasound were ordered and she is to contact us if she has not heard about scheduling within a few days.  She is to continue her previously prescribed doxycycline and will add cephalexin for additional coverage.  She was given a refill of tramadol up to 3 times a day for 3 days (#9, refill 0) to manage her symptoms.  She  denies history of seizures.  Review of [place] controlled substance database shows no inappropriate refills.  We discussed that she should have a low threshold for going to the emergency room for additional  intervention.  If she has any recurrent/enlarging lesion, fever, increasing pain, nausea, vomiting, difficulty scheduling with surgery she should go to the ER for further evaluation and management which she expressed understanding.  Strict return precautions given.  Work excuse note provided.  Final Clinical Impressions(s) / UC Diagnoses   Final diagnoses:  Acute abscess of areola  Left breast abscess     Discharge Instructions      We drained approximately 14 mL of pus from your breast.  It is very important that you follow-up with the surgeon.  Please call them to schedule an appointment first thing tomorrow.  I have placed orders for a mammogram and ultrasound.  Someone should contact you to schedule this appointment if you do not hear from them by next week please contact me.  In addition to doxycycline, start cephalexin 500 mg 4 times daily.  I have called in a refill of your tramadol.  This will make you sedated so do not drive or drink alcohol with taking it.  As we discussed, if you have trouble getting into see a surgeon, enlarging lesion, increased pain, fever, nausea, vomiting you need to go to the emergency room immediately.     ED Prescriptions     Medication Sig Dispense Auth. Provider   cephALEXin (KEFLEX) 500 MG capsule Take 1 capsule (500 mg total) by mouth 4 (four) times daily. 20 capsule Madlynn Lundeen K, PA-C   traMADol (ULTRAM) 50 MG tablet Take 1 tablet (50 mg total) by mouth every 8 (eight) hours as needed for up to 3 days. 9 tablet Khalessi Blough K, PA-C      I have reviewed the PDMP during this encounter.   Jeani Hawking, PA-C 06/12/22 1944

## 2022-06-13 ENCOUNTER — Other Ambulatory Visit: Payer: Self-pay | Admitting: Physician Assistant

## 2022-06-13 DIAGNOSIS — N611 Abscess of the breast and nipple: Secondary | ICD-10-CM

## 2022-06-17 ENCOUNTER — Ambulatory Visit
Admission: RE | Admit: 2022-06-17 | Discharge: 2022-06-17 | Disposition: A | Discharge status: Home / Self Care | Payer: Medicaid Other | Source: Ambulatory Visit | Attending: Physician Assistant | Admitting: Physician Assistant

## 2022-06-17 ENCOUNTER — Other Ambulatory Visit (HOSPITAL_COMMUNITY): Payer: Self-pay | Admitting: Physician Assistant

## 2022-06-17 ENCOUNTER — Other Ambulatory Visit: Payer: Medicaid Other

## 2022-06-17 DIAGNOSIS — N6310 Unspecified lump in the right breast, unspecified quadrant: Secondary | ICD-10-CM

## 2022-06-17 DIAGNOSIS — N611 Abscess of the breast and nipple: Secondary | ICD-10-CM

## 2022-06-17 DIAGNOSIS — R599 Enlarged lymph nodes, unspecified: Secondary | ICD-10-CM

## 2022-06-24 ENCOUNTER — Ambulatory Visit
Admission: RE | Admit: 2022-06-24 | Discharge: 2022-06-24 | Disposition: A | Discharge status: Home / Self Care | Payer: Medicaid Other | Source: Ambulatory Visit | Attending: Physician Assistant | Admitting: Physician Assistant

## 2022-06-24 ENCOUNTER — Other Ambulatory Visit (HOSPITAL_COMMUNITY): Payer: Self-pay | Admitting: Physician Assistant

## 2022-06-24 DIAGNOSIS — N6310 Unspecified lump in the right breast, unspecified quadrant: Secondary | ICD-10-CM

## 2022-06-24 DIAGNOSIS — R599 Enlarged lymph nodes, unspecified: Secondary | ICD-10-CM

## 2022-06-24 DIAGNOSIS — N611 Abscess of the breast and nipple: Secondary | ICD-10-CM

## 2022-06-24 HISTORY — PX: BREAST BIOPSY: SHX20

## 2022-06-25 ENCOUNTER — Other Ambulatory Visit: Payer: Medicaid Other

## 2022-06-26 ENCOUNTER — Telehealth: Payer: Self-pay | Admitting: Hematology and Oncology

## 2022-06-26 LAB — AEROBIC/ANAEROBIC CULTURE W GRAM STAIN (SURGICAL/DEEP WOUND)

## 2022-06-26 NOTE — Telephone Encounter (Signed)
Spoke to patient to confirm upcoming morning [facility] clinic on 5/1, paperwork will be sent via mail.   Gave location and time, also informed patient that the surgeon's office would be calling as well to get information from them similar to the packet that they will be receiving so make sure to do both.  Reminded patient that all providers will be coming to the clinic to see them HERE and if they had any questions to not hesitate to reach back out to myself or their navigators.

## 2022-06-27 ENCOUNTER — Ambulatory Visit: Payer: Self-pay | Admitting: Surgery

## 2022-06-29 LAB — AEROBIC/ANAEROBIC CULTURE W GRAM STAIN (SURGICAL/DEEP WOUND)

## 2022-06-30 ENCOUNTER — Ambulatory Visit: Payer: Self-pay | Admitting: Surgery

## 2022-06-30 ENCOUNTER — Encounter: Payer: Self-pay | Admitting: *Deleted

## 2022-07-01 ENCOUNTER — Other Ambulatory Visit: Payer: Self-pay | Admitting: *Deleted

## 2022-07-01 ENCOUNTER — Other Ambulatory Visit: Payer: Self-pay | Admitting: Physician Assistant

## 2022-07-01 DIAGNOSIS — N611 Abscess of the breast and nipple: Secondary | ICD-10-CM

## 2022-07-01 DIAGNOSIS — C50411 Malignant neoplasm of upper-outer quadrant of right female breast: Secondary | ICD-10-CM | POA: Insufficient documentation

## 2022-07-02 ENCOUNTER — Inpatient Hospital Stay: Payer: Medicaid Other

## 2022-07-02 ENCOUNTER — Inpatient Hospital Stay: Payer: Medicaid Other | Admitting: Hematology and Oncology

## 2022-07-02 ENCOUNTER — Inpatient Hospital Stay: Payer: Medicaid Other | Admitting: Licensed Clinical Social Worker

## 2022-07-02 ENCOUNTER — Ambulatory Visit: Payer: Medicaid Other | Admitting: Physical Therapy

## 2022-07-02 ENCOUNTER — Ambulatory Visit
Admission: RE | Admit: 2022-07-02 | Discharge: 2022-07-02 | Disposition: A | Discharge status: Home / Self Care | Payer: Medicaid Other | Source: Ambulatory Visit | Attending: Radiation Oncology | Admitting: Radiation Oncology

## 2022-07-02 ENCOUNTER — Telehealth: Payer: Self-pay | Admitting: Hematology and Oncology

## 2022-07-02 ENCOUNTER — Encounter: Payer: Medicaid Other | Admitting: Genetic Counselor

## 2022-07-02 NOTE — Progress Notes (Deleted)
REFERRING PROVIDER: Rachel Moulds, MD  PRIMARY PROVIDER:  Grayce Sessions, NP  PRIMARY REASON FOR VISIT:  ***  HISTORY OF PRESENT ILLNESS:   Nicole Browning, a 52 y.o. female, was seen for a [place] cancer genetics consultation during the breast multidisciplinary clinic at the request of Dr. Al Pimple due to a personal history of cancer.  Nicole Browning presents to clinic today to discuss the possibility of a hereditary predisposition to cancer, to discuss genetic testing, and to further clarify her future cancer risks, as well as potential cancer risks for family members.   In April 2024, at the age of 52, Nicole Browning was diagnosed with invasive ductal carcinoma of the right breast (***). The treatment plan ***.   RISK FACTORS:  Menarche was at age ***.  First live birth at age ***.  OCP use for approximately {Numbers 1-12 multi-select:20307} years.  Ovaries intact: {Yes/No-Ex:120004}.  Uterus intact: {Yes/No-Ex:120004}.  Menopausal status: {Menopause:31378}.  HRT use: {Numbers 1-12 multi-select:20307} years. Colonoscopy: {Yes/No-Ex:120004}; {normal/abnormal/not examined:14677}. Mammogram within the last year: {Yes/No-Ex:120004}. Number of breast biopsies: {Numbers 1-12 multi-select:20307}. Up to date with pelvic exams: {Yes/No-Ex:120004}. Any excessive radiation exposure in the past: {Yes/No-Ex:120004}  Past Medical History:  Diagnosis Date   Anemia    Diverticulitis 12/13/2020   GERD (gastroesophageal reflux disease)    IBS (irritable bowel syndrome)    Perirectal abscess    Protein-calorie malnutrition (HCC) 02/29/2016   Tobacco use    UC (ulcerative colitis) (HCC)     Past Surgical History:  Procedure Laterality Date   BIOPSY  09/29/2020   Procedure: BIOPSY;  Surgeon: Lynann Bologna, MD;  Location: [facility] ENDOSCOPY;  Service: Endoscopy;;   BREAST BIOPSY Right 06/24/2022   [place] RT BREAST BX W LOC DEV 1ST LESION IMG BX SPEC US GUIDE 06/24/2022 [facility]   BREAST  BIOPSY Right 06/24/2022   [place] RT BREAST BX W LOC DEV EA ADD LESION IMG BX SPEC US GUIDE 06/24/2022 [facility]   ESOPHAGOGASTRODUODENOSCOPY (EGD) WITH PROPOFOL N/A 09/29/2020   Procedure: ESOPHAGOGASTRODUODENOSCOPY (EGD) WITH PROPOFOL;  Surgeon: Lynann Bologna, MD;  Location: [facility] ENDOSCOPY;  Service: Endoscopy;  Laterality: N/A;   IR RADIOLOGIST EVAL & MGMT  10/17/2020   IRRIGATION AND DEBRIDEMENT ABSCESS N/A 02/24/2016   Procedure: IRRIGATION AND DEBRIDEMENT ABSCESS;  Surgeon: Almond Lint, MD;  Location: [facility] OR;  Service: General;  Laterality: N/A;    Social History   Socioeconomic History   Marital status: Single    Spouse name: Not on file   Number of children: Not on file   Years of education: Not on file   Highest education level: Not on file  Occupational History   Not on file  Tobacco Use   Smoking status: Every Day    Packs/day: 0.33    Years: 28.00    Additional pack years: 0.00    Total pack years: 9.24    Types: Cigarettes   Smokeless tobacco: Never  Vaping Use   Vaping Use: Never used  Substance and Sexual Activity   Alcohol use: Yes    Alcohol/week: 2.0 standard drinks of alcohol    Types: 2 Glasses of wine per week   Drug use: No   Sexual activity: Not Currently    Birth control/protection: Injection  Other Topics Concern   Not on file  Social History Narrative   Not on file   Social Determinants of Health   Financial Resource Strain: Not on file  Food Insecurity: Not on file  Transportation Needs: Not  on file  Physical Activity: Not on file  Stress: Not on file  Social Connections: Not on file     FAMILY HISTORY:  We obtained a detailed, 4-generation family history.  Significant diagnoses are listed below: Family History  Problem Relation Age of Onset   Diabetes Mother    COPD Father    Leukemia Brother    Colon cancer Neg Hx    Colon polyps Neg Hx    Esophageal cancer Neg Hx    Rectal cancer Neg Hx    Stomach cancer Neg Hx     Ms.  Browning is {aware/unaware} of previous family history of genetic testing for hereditary cancer risks. There {IS NO:12509} reported Ashkenazi Jewish ancestry.  GENETIC COUNSELING ASSESSMENT: Nicole Browning is a 52 y.o. female with a personal history of cancer which is somewhat suggestive of a hereditary cancer syndrome and predisposition to cancer. We, therefore, discussed and recommended the following at today's visit.   DISCUSSION: We discussed that 5 - 10% of cancer is hereditary, with most cases of hereditary breast cancer associated with mutations in BRCA1/2.  There are other genes that can be associated with hereditary breast and *** cancer syndromes. Type of cancer risk and level of risk are gene-specific. We discussed that testing is beneficial for several reasons including knowing how to follow individuals after completing their treatment, identifying whether potential treatment options would be beneficial, and understanding if other family members could be at risk for cancer and allowing them to undergo genetic testing.   ***We reviewed the characteristics, features and inheritance patterns of hereditary cancer syndromes. We also discussed genetic testing, including the appropriate family members to test, the process of testing, insurance coverage and turn-around-time for results. We discussed the implications of a negative, positive and/or variant of uncertain significant result. In order to get genetic test results in a timely manner so that Ms. Romanoski can use these genetic test results for surgical decisions, we recommended Nicole Browning pursue genetic testing for the ***. Once complete, we recommend Nicole Browning pursue reflex genetic testing to a more comprehensive gene panel.   Nicole Browning  was offered a common hereditary cancer panel (48 genes) and an expanded pan-cancer panel (70 genes). Nicole Browning was informed of the benefits and limitations of each panel, including that expanded  pan-cancer panels contain genes that do not have clear management guidelines at this point in time.  We also discussed that as the number of genes included on a panel increases, the chances of variants of uncertain significance increases.  After considering the benefits and limitations of each gene panel, Ms. Amparo elected to have ***.  ****  Based on Ms. Caywood's {Personal/family:20331} history of cancer, she meets medical criteria for genetic testing. Despite that she meets criteria, she may still have an out of pocket cost. We discussed that if ***her out of pocket cost for testing is over $100, the laboratory should contact them to discuss self-pay prices, patient pay assistance programs, if applicable, and other billing options.   PLAN: After considering the risks, benefits, and limitations, Ms. Ewing provided informed consent to pursue genetic testing and the blood sample was sent to {Lab} Laboratories for analysis of the {test}. Results should be available within approximately 1-2 weeks' time, at which point they will be disclosed by telephone to Ms. Bronk, as will any additional recommendations warranted by these results. Ms. January will receive a summary of her genetic counseling visit and a copy of her results once available.  This information will also be available in Epic.   *** Despite our recommendation, Ms. Zeitlin did not wish to pursue genetic testing at today's visit. We understand this decision and remain available to coordinate genetic testing at any time in the future. We, therefore, recommend Ms. Fraleigh continue to follow the cancer screening guidelines given by her primary healthcare provider.  ***Based on Ms. Sitts's family history, we recommended her ***, who was diagnosed with *** at age ***, have genetic counseling and testing. Ms. Lanza will let us know if we can be of any assistance in coordinating genetic counseling and/or testing for this family member.    Lastly, we encouraged Ms. Naraine to remain in contact with cancer genetics annually so that we can continuously update the family history and inform her of any changes in cancer genetics and testing that may be of benefit for this family.   Ms. Vaux questions were answered to her satisfaction today. Our contact information was provided should additional questions or concerns arise. Thank you for the referral and allowing [place] to share in the care of your patient.   Lalla Brothers, MS, [facility] Genetic Counselor [email or web address] (P) [phone]  The patient was seen for a total of *** minutes in face-to-face ***audio and video genetic counseling.  ***The patient brought ***.  ***The patient was seen alone.  Drs. Pamelia Hoit and/or Mosetta Putt were available to discuss this case as needed.  _______________________________________________________________________ For Office Staff:  Number of people involved in session: *** Was an Intern/ student involved with case: {YES/NO:63}

## 2022-07-02 NOTE — Telephone Encounter (Signed)
Spoke to patient who missed her appointment today and tried to reschedule for next week, realized she had another appointment, so the team decided to just send patient over to a surgeon, I let the patient know the new plan, to not come to appointment with [place] that the surgeons office would coordinate an appointment for her now

## 2022-07-09 ENCOUNTER — Other Ambulatory Visit: Payer: Medicaid Other

## 2022-07-17 ENCOUNTER — Telehealth: Payer: Self-pay | Admitting: Hematology and Oncology

## 2022-07-17 NOTE — Telephone Encounter (Signed)
Called patient received no answer. Unable to leave patient a vm

## 2022-07-18 ENCOUNTER — Encounter: Payer: Self-pay | Admitting: *Deleted

## 2022-07-21 ENCOUNTER — Other Ambulatory Visit: Payer: Self-pay | Admitting: General Surgery

## 2022-07-21 DIAGNOSIS — Z170 Estrogen receptor positive status [ER+]: Secondary | ICD-10-CM

## 2022-07-22 ENCOUNTER — Telehealth: Payer: Self-pay | Admitting: Hematology and Oncology

## 2022-07-22 NOTE — Telephone Encounter (Signed)
scheduled per referral, pt has been called and confirmed date and time. Pt is aware of location and to arrive early for check in   

## 2022-07-23 ENCOUNTER — Other Ambulatory Visit: Payer: Self-pay | Admitting: General Surgery

## 2022-07-23 ENCOUNTER — Other Ambulatory Visit: Payer: Self-pay | Admitting: *Deleted

## 2022-07-23 ENCOUNTER — Telehealth: Payer: Self-pay

## 2022-07-23 ENCOUNTER — Encounter: Payer: Self-pay | Admitting: *Deleted

## 2022-07-23 DIAGNOSIS — Z170 Estrogen receptor positive status [ER+]: Secondary | ICD-10-CM

## 2022-07-23 NOTE — Telephone Encounter (Signed)
Rn attempted to call pt to obtain meaningful use and nurse evaluation information without success. Voicemail message was left for pt. Rn will attempt to call back later this afternoon.

## 2022-07-24 ENCOUNTER — Ambulatory Visit
Admission: RE | Admit: 2022-07-24 | Discharge: 2022-07-24 | Disposition: A | Discharge status: Home / Self Care | Payer: Medicaid Other | Source: Ambulatory Visit | Attending: Radiation Oncology | Admitting: Radiation Oncology

## 2022-07-24 ENCOUNTER — Ambulatory Visit: Payer: Medicaid Other

## 2022-07-24 ENCOUNTER — Telehealth: Payer: Self-pay | Admitting: Radiation Oncology

## 2022-07-24 DIAGNOSIS — D0511 Intraductal carcinoma in situ of right breast: Secondary | ICD-10-CM

## 2022-07-24 NOTE — Telephone Encounter (Signed)
5/23 @ 2:27 pm called patient's contact # no answer cannot leave voicemail.  Also called/spoke to patient's sister Marcelino Duster, she will relay message we are trying to call her about missed appt to reschedule.

## 2022-07-25 ENCOUNTER — Telehealth: Payer: Self-pay | Admitting: Radiation Oncology

## 2022-07-25 NOTE — Telephone Encounter (Signed)
5/24 @ 8:56 am Called patient to get her reschedule after her surgery date -per Carolann Littler, RN.  Patient stated she will need to reschedule her surgery due to daughter's graduation the next day.  She want to be there for her.  Called Dr. Arita Miss office left voicemail for her surgery scheduling team so they are aware.  Waiting on new surgery date, before reschedule consult.

## 2022-07-30 ENCOUNTER — Telehealth: Payer: Self-pay | Admitting: Radiation Oncology

## 2022-07-30 NOTE — Telephone Encounter (Signed)
5/29 @ 9:52 am Left voicemail for patient to call our office to be reschedule after new surgery date 6/13.

## 2022-08-01 ENCOUNTER — Telehealth: Payer: Self-pay | Admitting: Hematology and Oncology

## 2022-08-01 NOTE — Telephone Encounter (Signed)
scheduled per referral, pt has been called and confirmed date and time. Pt is aware of location and to arrive early for check in, I offered sooner appt but pt requested 6/27, also am sending letter per pt request

## 2022-08-05 ENCOUNTER — Other Ambulatory Visit: Payer: Self-pay

## 2022-08-05 ENCOUNTER — Encounter (HOSPITAL_BASED_OUTPATIENT_CLINIC_OR_DEPARTMENT_OTHER): Payer: Self-pay | Admitting: General Surgery

## 2022-08-05 ENCOUNTER — Ambulatory Visit: Payer: Medicaid Other

## 2022-08-07 ENCOUNTER — Encounter: Payer: Self-pay | Admitting: Rehabilitation

## 2022-08-07 ENCOUNTER — Other Ambulatory Visit: Payer: Medicaid Other

## 2022-08-07 ENCOUNTER — Ambulatory Visit: Payer: Medicaid Other | Attending: General Surgery | Admitting: Rehabilitation

## 2022-08-07 ENCOUNTER — Other Ambulatory Visit: Payer: Self-pay

## 2022-08-07 ENCOUNTER — Ambulatory Visit: Payer: Medicaid Other | Admitting: Rehabilitation

## 2022-08-07 ENCOUNTER — Ambulatory Visit: Payer: Medicaid Other | Admitting: Hematology and Oncology

## 2022-08-07 DIAGNOSIS — Z170 Estrogen receptor positive status [ER+]: Secondary | ICD-10-CM | POA: Insufficient documentation

## 2022-08-07 DIAGNOSIS — R293 Abnormal posture: Secondary | ICD-10-CM | POA: Insufficient documentation

## 2022-08-07 DIAGNOSIS — C50411 Malignant neoplasm of upper-outer quadrant of right female breast: Secondary | ICD-10-CM | POA: Diagnosis present

## 2022-08-07 NOTE — Therapy (Signed)
OUTPATIENT PHYSICAL THERAPY BREAST CANCER BASELINE EVALUATION   Patient Name: Nicole Browning MRN: [id number] DOB:[birth year], 52 y.o., female Today's Date: 08/07/2022  END OF SESSION:  PT End of Session - 08/07/22 1602     Visit Number 1    Number of Visits 2    Date for PT Re-Evaluation 10/02/22    Authorization Type [employer] medicaid - auth needed    PT Start Time 1520    PT Stop Time 1546    PT Time Calculation (min) 26 min    Activity Tolerance Patient tolerated treatment well    Behavior During Therapy [facility] for tasks assessed/performed             Past Medical History:  Diagnosis Date   Anemia    Diverticulitis 12/13/2020   GERD (gastroesophageal reflux disease)    IBS (irritable bowel syndrome)    Perirectal abscess    Protein-calorie malnutrition (HCC) 02/29/2016   Tobacco use    UC (ulcerative colitis) (HCC)    Past Surgical History:  Procedure Laterality Date   BIOPSY  09/29/2020   Procedure: BIOPSY;  Surgeon: Lynann Bologna, MD;  Location: [facility] ENDOSCOPY;  Service: Endoscopy;;   BREAST BIOPSY Right 06/24/2022   [place] RT BREAST BX W LOC DEV 1ST LESION IMG BX SPEC US GUIDE 06/24/2022 [facility]   BREAST BIOPSY Right 06/24/2022   [place] RT BREAST BX W LOC DEV EA ADD LESION IMG BX SPEC US GUIDE 06/24/2022 [facility]   ESOPHAGOGASTRODUODENOSCOPY (EGD) WITH PROPOFOL N/A 09/29/2020   Procedure: ESOPHAGOGASTRODUODENOSCOPY (EGD) WITH PROPOFOL;  Surgeon: Lynann Bologna, MD;  Location: [facility] ENDOSCOPY;  Service: Endoscopy;  Laterality: N/A;   IR RADIOLOGIST EVAL & MGMT  10/17/2020   IRRIGATION AND DEBRIDEMENT ABSCESS N/A 02/24/2016   Procedure: IRRIGATION AND DEBRIDEMENT ABSCESS;  Surgeon: Almond Lint, MD;  Location: [facility] OR;  Service: General;  Laterality: N/A;   Patient Active Problem List   Diagnosis Date Noted   Malignant neoplasm of upper-outer quadrant of right breast in female, estrogen receptor positive (HCC) 07/01/2022   Diverticulitis 12/13/2020   Smoking  11/22/2020   Liver abscess 10/17/2020   Medication monitoring encounter 10/17/2020   Streptococcal bacteremia    Intra-abdominal abscess (HCC) 10/03/2020   AKI (acute kidney injury) (HCC) 10/03/2020   Protein-calorie malnutrition, severe 09/28/2020   IDA (iron deficiency anemia) 07/24/2020   IBD (inflammatory bowel disease) 07/24/2020   Protein-calorie malnutrition (HCC) 02/29/2016   Elevated hemoglobin A1c 02/27/2016   GERD (gastroesophageal reflux disease)    Tobacco use    Anemia of chronic disease    Prediabetes    Perirectal abscess 02/24/2016    REFERRING PROVIDER: Dr. Donell Beers  REFERRING DIAG: C50.411,Z17.0 (ICD-10-CM) - Malignant neoplasm of upper-outer quadrant of right breast in female, estrogen receptor positive (HCC)   THERAPY DIAG:  Malignant neoplasm of upper-outer quadrant of right breast in female, estrogen receptor positive (HCC)  Abnormal posture  Rationale for Evaluation and Treatment: Rehabilitation  ONSET DATE: 07/2022  SUBJECTIVE:  SUBJECTIVE STATEMENT: Patient reports she is here today to be seen by her medical team for her newly diagnosed right breast cancer.   PERTINENT HISTORY:  Active Lt breast abscess with infection with recurrent aspiration. Rt breast cancer. Pt will have lumpectomy and SLNB on 08/14/22.   PATIENT GOALS:   reduce lymphedema risk and learn post op HEP.   PAIN:  Are you having pain? No  PRECAUTIONS: Active CA   HAND DOMINANCE: right  WEIGHT BEARING RESTRICTIONS: No  FALLS:  Has patient fallen in last 6 months? 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   LEISURE: dancing, dog walks   PRIOR LEVEL OF FUNCTION:  Independent   OBJECTIVE:  COGNITION: Overall cognitive status: Within functional limits for tasks assessed    POSTURE:  Forward head and rounded shoulders posture  UPPER EXTREMITY AROM/PROM:  A/PROM RIGHT   eval   Shoulder extension 70  Shoulder flexion 165  Shoulder abduction 170  Shoulder internal rotation   Shoulder external rotation 80    (Blank rows = not tested)  CERVICAL AROM: All within normal limits:   UPPER EXTREMITY STRENGTH: normal   LYMPHEDEMA ASSESSMENTS:   LANDMARK LEFT eval  10 cm proximal to olecranon process 23.1  Olecranon process 21.5  10 cm proximal to ulnar styloid process 18  Just proximal to ulnar styloid process 14.2  Across hand at thumb web space 18.0  At base of 2nd digit 5.8  (Blank rows = not tested)  LANDMARK RIGHT eval  10 cm proximal to olecranon process 24  Olecranon process 22  10 cm proximal to ulnar styloid process 18  Just proximal to ulnar styloid process 14.2  Across hand at thumb web space 18.3  At base of 2nd digit 5.6  (Blank rows = not tested)  L-DEX LYMPHEDEMA SCREENING: The patient was assessed using the L-Dex machine today to produce a lymphedema index baseline score. The patient will be reassessed on a regular basis (typically every 3 months) to obtain new L-Dex scores. If the score is > 6.5 points away from his/her baseline score indicating onset of subclinical lymphedema, it will be recommended to wear a compression garment for 4 weeks, 12 hours per day and then be reassessed. If the score continues to be > 6.5 points from baseline at reassessment, we will initiate lymphedema treatment. Assessing in this manner has a 95% rate of preventing clinically significant lymphedema.  QUICK DASH SURVEY: Not completed  PATIENT EDUCATION:  Education details: Lymphedema risk reduction and post op shoulder/posture HEP Person educated: Patient Education method: Explanation, Demonstration, Handout Education comprehension:  Patient verbalized understanding and returned demonstration  HOME EXERCISE PROGRAM: Patient was instructed today in a home exercise program today for post op shoulder range of motion. These included active assist shoulder flexion in sitting, scapular retraction, wall walking with shoulder abduction, and hands behind head external rotation.  She was encouraged to do these twice a day, holding 3 seconds and repeating 5 times when permitted by her physician.   ASSESSMENT:  CLINICAL IMPRESSION: Pt will benefit from a post op PT reassessment to determine needs and from L-Dex screens every 3 months for 2 years to detect subclinical lymphedema.  Pt will benefit from skilled therapeutic intervention to improve on the following deficits: Decreased knowledge of precautions, impaired UE functional use, pain, decreased ROM, postural dysfunction.   PT treatment/interventions: ADL/self-care home management, pt/family education, therapeutic  exercise  REHAB POTENTIAL: Excellent  CLINICAL DECISION MAKING: Stable/uncomplicated  EVALUATION COMPLEXITY: Low   GOALS: Goals reviewed with patient? YES  LONG TERM GOALS: (STG=LTG)    Name Target Date Goal status  1 Pt will be able to verbalize understanding of pertinent lymphedema risk reduction practices relevant to her dx specifically related to skin care.  Baseline:  No knowledge 08/07/2022 Achieved at eval  2 Pt will be able to return demo and/or verbalize understanding of the post op HEP related to regaining shoulder ROM. Baseline:  No knowledge 08/07/2022 Achieved at eval  3 Pt will be able to verbalize understanding of the importance of attending the post op After Breast CA Class for further lymphedema risk reduction education and therapeutic exercise.  Baseline:  No knowledge 08/07/2022 Achieved at eval  4 Pt will demo she has regained full shoulder ROM and function post operatively compared to baselines.  Baseline: See objective measurements taken today.  10/02/22     PLAN:  PT FREQUENCY/DURATION: EVAL and 1 follow up appointment.   PLAN FOR NEXT SESSION: will reassess 3-4 weeks post op to determine needs.   Patient will follow up at outpatient cancer rehab 3-4 weeks following surgery.  If the patient requires physical therapy at that time, a specific plan will be dictated and sent to the referring physician for approval. The patient was educated today on appropriate basic range of motion exercises to begin post operatively and the importance of attending the After Breast Cancer class following surgery.  Patient was educated today on lymphedema risk reduction practices as it pertains to recommendations that will benefit the patient immediately following surgery.  She verbalized good understanding.    Physical Therapy Information for After Breast Cancer Surgery/Treatment:  Lymphedema is a swelling condition that you may be at risk for in your arm if you have lymph nodes removed from the armpit area.  After a sentinel node biopsy, the risk is approximately 5-9% and is higher after an axillary node dissection.  There is treatment available for this condition and it is not life-threatening.  Contact your physician or physical therapist with concerns. You may begin the 4 shoulder/posture exercises (see additional sheet) when permitted by your physician (typically a week after surgery).  If you have drains, you may need to wait until those are removed before beginning range of motion exercises.  A general recommendation is to not lift your arms above shoulder height until drains are removed.  These exercises should be done to your tolerance and gently.  This is not a "no pain/no gain" type of recovery so listen to your body and stretch into the range of motion that you can tolerate, stopping if you have pain.  If you are having immediate reconstruction, ask your plastic surgeon about doing exercises as he or she may want you to wait. We encourage you to  attend the free one time ABC (After Breast Cancer) class offered by [facility].  You will learn information related to lymphedema risk, prevention and treatment and additional exercises to regain mobility following surgery.  You can call [phone] for more information.  This is offered the 1st and 3rd Monday of each month.  You only attend the class one time. While undergoing any medical procedure or treatment, try to avoid blood pressure being taken or needle sticks from occurring on the arm on the side of cancer.   This recommendation begins after surgery and continues for the rest of your life.  This may help reduce your risk of getting lymphedema (swelling in your arm). An excellent resource for those seeking information on lymphedema is the National Lymphedema Network's web site. It can be accessed at www.lymphnet.org If you notice swelling in your hand, arm or breast at any time following surgery (even if it is many years from now), please contact your doctor or physical therapist to discuss this.  Lymphedema can be treated at any time but it is easier for you if it is treated early on.  If you feel like your shoulder motion is not returning to normal in a reasonable amount of time, please contact your surgeon or physical therapist.  [facility] [phone]. [address], [place] [address]  ABC CLASS After Breast Cancer Class  After Breast Cancer Class is a specially designed exercise class to assist you in a safe recover after having breast cancer surgery.  In this class you will learn how to get back to full function whether your drains were just removed or if you had surgery a month ago.  This one-time class is held the 1st and 3rd Monday of every month from 11:00 a.m. until 12:00 noon virtually.  This class is FREE and space is limited. For more information or to register for the next available class, call [phone]  [phone].  Class Goals  Understand specific stretches to improve the flexibility of you chest and shoulder. Learn ways to safely strengthen your upper body and improve your posture. Understand the warning signs of infection and why you may be at risk for an arm infection. Learn about Lymphedema and prevention.  ** You do not attend this class until after surgery.  Drains must be removed to participate  Patient was instructed today in a home exercise program today for post op shoulder range of motion. These included active assist shoulder flexion in sitting, scapular retraction, wall walking with shoulder abduction, and hands behind head external rotation.  She was encouraged to do these twice a day, holding 3 seconds and repeating 5 times when permitted by her physician.    Idamae Lusher, PT 08/07/2022, 4:02 PM

## 2022-08-08 ENCOUNTER — Encounter: Payer: Self-pay | Admitting: *Deleted

## 2022-08-08 ENCOUNTER — Telehealth: Payer: Self-pay | Admitting: Licensed Clinical Social Worker

## 2022-08-08 DIAGNOSIS — C50411 Malignant neoplasm of upper-outer quadrant of right female breast: Secondary | ICD-10-CM

## 2022-08-08 NOTE — Telephone Encounter (Signed)
[facility] Clinical Social Work  Clinical Social Work was referred by Statistician for assessment of psychosocial needs.  Clinical Social Worker attempted to contact patient by phone to offer support and assess for needs.    11:45am- CSW called and spoke briefly with patient re: reason for referral (depression). Pt unable to speak at the time but agreed to a call back around 2pm.  2:04pm- CSW attempted to call pt. No answer, left VM with direct contact information.     Jordie Schreur E Lyrika Souders, LCSW  Clinical Social Worker [facility]        Patient is participating in a Managed Medicaid Plan:  Yes

## 2022-08-11 ENCOUNTER — Inpatient Hospital Stay: Payer: Medicaid Other | Attending: Licensed Clinical Social Worker | Admitting: Licensed Clinical Social Worker

## 2022-08-11 DIAGNOSIS — Z170 Estrogen receptor positive status [ER+]: Secondary | ICD-10-CM | POA: Insufficient documentation

## 2022-08-11 DIAGNOSIS — C50411 Malignant neoplasm of upper-outer quadrant of right female breast: Secondary | ICD-10-CM | POA: Insufficient documentation

## 2022-08-11 NOTE — Progress Notes (Signed)
[facility] Clinical Social Work  Initial Assessment   Nicole Browning is a 52 y.o. year old female contacted by phone. Clinical Social Work was referred by  PT  for assessment of psychosocial needs.   SDOH (Social Determinants of Health) assessments performed: No   SDOH Screenings   Depression (PHQ2-9): Medium Risk (11/22/2020)  Tobacco Use: High Risk (08/07/2022)     Distress Screen completed: No     No data to display            Family/Social Information:  Housing Arrangement: patient rents a house. Daughter just graduated and is on a trip, but will be returning to help care for pt post-surgery Family members/support persons in your life? Limited support. Has her daughter. A couple of friends but they are also dealing with their own cancer diagnoses Transportation concerns: no  Employment: working Education officer, environmental houses .  Income source: Employment Religious or spiritual practice: Not known Services Currently in place:  [employer] Medicaid  Coping/ Adjustment to diagnosis: Patient understands treatment plan and what happens next? yes, has surgery scheduled this week. Is feeling very overwhelmed Concerns about diagnosis and/or treatment: Overwhelmed by information and Afraid of cancer Patient reported stressors:  Issues with house- plumbing & AC. Landlord is having it fixed but it is a slow process and will take a while. Worried about friends who also have cancer (lung & colon) Current coping skills/ strengths: Capable of independent living , Motivation for treatment/growth , and Other: open to support    SUMMARY: Current SDOH Barriers:  Mental health related to cancer and other stressors  Clinical Social Work Clinical Goal(s):  Patient will connect with professional and peer supports to assist in maintaining mental wellbeing through cancer  Interventions: Discussed common feeling and emotions when being diagnosed with cancer, and the importance of support during treatment Informed  patient of the support team roles and support services at [facility] Provided CSW contact information and encouraged patient to call with any questions or concerns Provided patient with information about local therapists who accept her insurance Referred pt for Alight guide/ peer mentor   Follow Up Plan: Patient will contact CSW with any support or resource needs Patient verbalizes understanding of plan: Yes    Nicole Browning E Jevon Shells, LCSW Clinical Social Worker [facility]  Patient is participating in a Managed Medicaid Plan:  Yes

## 2022-08-13 ENCOUNTER — Ambulatory Visit
Admission: RE | Admit: 2022-08-13 | Discharge: 2022-08-13 | Disposition: A | Discharge status: Home / Self Care | Payer: Medicaid Other | Source: Ambulatory Visit | Attending: General Surgery | Admitting: General Surgery

## 2022-08-13 ENCOUNTER — Encounter: Payer: Self-pay | Admitting: Hematology and Oncology

## 2022-08-13 DIAGNOSIS — Z170 Estrogen receptor positive status [ER+]: Secondary | ICD-10-CM

## 2022-08-13 HISTORY — PX: BREAST BIOPSY: SHX20

## 2022-08-13 NOTE — Progress Notes (Signed)
Received call from patient regarding applying for financial assistance.  Advised patient once her treatment plan has been established, we will connect regarding applying for [employer]. She verbalized understanding.  She has my number for any additional financial questions or concerns.

## 2022-08-14 ENCOUNTER — Other Ambulatory Visit: Payer: Self-pay

## 2022-08-14 ENCOUNTER — Encounter (HOSPITAL_BASED_OUTPATIENT_CLINIC_OR_DEPARTMENT_OTHER): Payer: Self-pay | Admitting: General Surgery

## 2022-08-14 ENCOUNTER — Ambulatory Visit (HOSPITAL_BASED_OUTPATIENT_CLINIC_OR_DEPARTMENT_OTHER): Payer: Medicaid Other | Admitting: Anesthesiology

## 2022-08-14 ENCOUNTER — Ambulatory Visit
Admission: RE | Admit: 2022-08-14 | Discharge: 2022-08-14 | Disposition: A | Discharge status: Home / Self Care | Payer: Medicaid Other | Source: Ambulatory Visit | Attending: General Surgery | Admitting: General Surgery

## 2022-08-14 ENCOUNTER — Ambulatory Visit (HOSPITAL_BASED_OUTPATIENT_CLINIC_OR_DEPARTMENT_OTHER)
Admission: RE | Admit: 2022-08-14 | Discharge: 2022-08-14 | Disposition: A | Discharge status: Home / Self Care | Payer: Medicaid Other | Attending: General Surgery | Admitting: General Surgery

## 2022-08-14 ENCOUNTER — Encounter (HOSPITAL_BASED_OUTPATIENT_CLINIC_OR_DEPARTMENT_OTHER)
Admission: RE | Disposition: A | Discharge status: Home / Self Care | Payer: Self-pay | Source: Home / Self Care | Attending: General Surgery

## 2022-08-14 DIAGNOSIS — N6011 Diffuse cystic mastopathy of right breast: Secondary | ICD-10-CM | POA: Insufficient documentation

## 2022-08-14 DIAGNOSIS — Z170 Estrogen receptor positive status [ER+]: Secondary | ICD-10-CM | POA: Insufficient documentation

## 2022-08-14 DIAGNOSIS — C50411 Malignant neoplasm of upper-outer quadrant of right female breast: Secondary | ICD-10-CM | POA: Diagnosis present

## 2022-08-14 DIAGNOSIS — Z806 Family history of leukemia: Secondary | ICD-10-CM | POA: Insufficient documentation

## 2022-08-14 DIAGNOSIS — F17210 Nicotine dependence, cigarettes, uncomplicated: Secondary | ICD-10-CM

## 2022-08-14 DIAGNOSIS — K5190 Ulcerative colitis, unspecified, without complications: Secondary | ICD-10-CM | POA: Diagnosis not present

## 2022-08-14 DIAGNOSIS — Z800 Family history of malignant neoplasm of digestive organs: Secondary | ICD-10-CM | POA: Diagnosis not present

## 2022-08-14 DIAGNOSIS — Z01818 Encounter for other preprocedural examination: Secondary | ICD-10-CM

## 2022-08-14 HISTORY — PX: BREAST LUMPECTOMY WITH RADIOACTIVE SEED AND SENTINEL LYMPH NODE BIOPSY: SHX6550

## 2022-08-14 SURGERY — BREAST LUMPECTOMY WITH RADIOACTIVE SEED AND SENTINEL LYMPH NODE BIOPSY
Anesthesia: Regional | Site: Breast | Laterality: Right

## 2022-08-14 MED ORDER — PHENYLEPHRINE HCL (PRESSORS) 10 MG/ML IV SOLN
INTRAVENOUS | Status: DC | PRN
  Administered 2022-08-14 (×2): 80 ug via INTRAVENOUS

## 2022-08-14 MED ORDER — MIDAZOLAM HCL 2 MG/2ML IJ SOLN
INTRAMUSCULAR | Status: AC
  Filled 2022-08-14: qty 2

## 2022-08-14 MED ORDER — CEFAZOLIN SODIUM-DEXTROSE 2-4 GM/100ML-% IV SOLN
INTRAVENOUS | Status: AC
  Filled 2022-08-14: qty 100

## 2022-08-14 MED ORDER — KETOROLAC TROMETHAMINE 30 MG/ML IJ SOLN
INTRAMUSCULAR | Status: AC
  Filled 2022-08-14: qty 1

## 2022-08-14 MED ORDER — MIDAZOLAM HCL 5 MG/5ML IJ SOLN
INTRAMUSCULAR | Status: DC | PRN
  Administered 2022-08-14 (×2): 1 mg via INTRAVENOUS

## 2022-08-14 MED ORDER — ONDANSETRON HCL 4 MG/2ML IJ SOLN
INTRAMUSCULAR | Status: DC | PRN
  Administered 2022-08-14: 4 mg via INTRAVENOUS

## 2022-08-14 MED ORDER — ONDANSETRON HCL 4 MG/2ML IJ SOLN: 4.0000 mg | Freq: Four times a day (QID) | INTRAMUSCULAR | Status: DC | PRN

## 2022-08-14 MED ORDER — MAGTRACE LYMPHATIC TRACER
INTRAMUSCULAR | Status: DC | PRN
  Administered 2022-08-14: 2 mL via INTRAMUSCULAR

## 2022-08-14 MED ORDER — CHLORHEXIDINE GLUCONATE CLOTH 2 % EX PADS: 6.0000 | MEDICATED_PAD | Freq: Once | CUTANEOUS | Status: DC

## 2022-08-14 MED ORDER — MIDAZOLAM HCL 2 MG/2ML IJ SOLN
2.0000 mg | Freq: Once | INTRAMUSCULAR | Status: AC
  Administered 2022-08-14: 1 mg via INTRAVENOUS

## 2022-08-14 MED ORDER — FENTANYL CITRATE (PF) 100 MCG/2ML IJ SOLN
INTRAMUSCULAR | Status: AC
  Filled 2022-08-14: qty 2

## 2022-08-14 MED ORDER — FENTANYL CITRATE (PF) 100 MCG/2ML IJ SOLN
100.0000 ug | Freq: Once | INTRAMUSCULAR | Status: AC
  Administered 2022-08-14: 50 ug via INTRAVENOUS

## 2022-08-14 MED ORDER — ONDANSETRON HCL 4 MG/2ML IJ SOLN
INTRAMUSCULAR | Status: AC
  Filled 2022-08-14: qty 2

## 2022-08-14 MED ORDER — ACETAMINOPHEN 500 MG PO TABS
ORAL_TABLET | ORAL | Status: AC
  Filled 2022-08-14: qty 2

## 2022-08-14 MED ORDER — HYDROCODONE-ACETAMINOPHEN 5-325 MG PO TABS
1.0000 | ORAL_TABLET | Freq: Four times a day (QID) | ORAL | 0 refills | Status: DC | PRN
Start: 2022-08-14 — End: 2022-10-17

## 2022-08-14 MED ORDER — FENTANYL CITRATE (PF) 100 MCG/2ML IJ SOLN
25.0000 ug | INTRAMUSCULAR | Status: DC | PRN
  Administered 2022-08-14: 50 ug via INTRAVENOUS

## 2022-08-14 MED ORDER — PROPOFOL 10 MG/ML IV BOLUS
INTRAVENOUS | Status: DC | PRN
  Administered 2022-08-14: 200 mg via INTRAVENOUS

## 2022-08-14 MED ORDER — PROPOFOL 500 MG/50ML IV EMUL
INTRAVENOUS | Status: DC | PRN
  Administered 2022-08-14: 200 ug/kg/min via INTRAVENOUS

## 2022-08-14 MED ORDER — ROPIVACAINE HCL 5 MG/ML IJ SOLN
INTRAMUSCULAR | Status: DC | PRN
  Administered 2022-08-14: 25 mL via PERINEURAL

## 2022-08-14 MED ORDER — PROPOFOL 10 MG/ML IV BOLUS
INTRAVENOUS | Status: AC
  Filled 2022-08-14: qty 20

## 2022-08-14 MED ORDER — PHENYLEPHRINE 80 MCG/ML (10ML) SYRINGE FOR IV PUSH (FOR BLOOD PRESSURE SUPPORT)
PREFILLED_SYRINGE | INTRAVENOUS | Status: AC
  Filled 2022-08-14: qty 10

## 2022-08-14 MED ORDER — CEFAZOLIN SODIUM-DEXTROSE 2-4 GM/100ML-% IV SOLN
2.0000 g | INTRAVENOUS | Status: AC
  Administered 2022-08-14: 2 g via INTRAVENOUS

## 2022-08-14 MED ORDER — KETOROLAC TROMETHAMINE 30 MG/ML IJ SOLN
INTRAMUSCULAR | Status: DC | PRN
  Administered 2022-08-14: 30 mg via INTRAVENOUS

## 2022-08-14 MED ORDER — LIDOCAINE HCL (CARDIAC) PF 100 MG/5ML IV SOSY
PREFILLED_SYRINGE | INTRAVENOUS | Status: DC | PRN
  Administered 2022-08-14: 50 mg via INTRAVENOUS

## 2022-08-14 MED ORDER — PROPOFOL 500 MG/50ML IV EMUL
INTRAVENOUS | Status: AC
  Filled 2022-08-14: qty 50

## 2022-08-14 MED ORDER — ACETAMINOPHEN 500 MG PO TABS
1000.0000 mg | ORAL_TABLET | ORAL | Status: AC
  Administered 2022-08-14: 1000 mg via ORAL

## 2022-08-14 MED ORDER — DEXAMETHASONE SODIUM PHOSPHATE 4 MG/ML IJ SOLN
INTRAMUSCULAR | Status: DC | PRN
  Administered 2022-08-14: 5 mg via INTRAVENOUS

## 2022-08-14 MED ORDER — FENTANYL CITRATE (PF) 100 MCG/2ML IJ SOLN
INTRAMUSCULAR | Status: DC | PRN
  Administered 2022-08-14 (×2): 50 ug via INTRAVENOUS

## 2022-08-14 MED ORDER — LIDOCAINE HCL 1 % IJ SOLN
INTRAMUSCULAR | Status: DC | PRN
  Administered 2022-08-14: 28 mL via INTRAMUSCULAR

## 2022-08-14 MED ORDER — LACTATED RINGERS IV SOLN: INTRAVENOUS | Status: DC

## 2022-08-14 MED ORDER — DEXAMETHASONE SODIUM PHOSPHATE 10 MG/ML IJ SOLN
INTRAMUSCULAR | Status: AC
  Filled 2022-08-14: qty 1

## 2022-08-14 SURGICAL SUPPLY — 72 items
ADH SKN CLS APL DERMABOND .7 (GAUZE/BANDAGES/DRESSINGS) ×2
APL PRP STRL LF DISP 70% ISPRP (MISCELLANEOUS) ×2
BINDER BREAST LRG (GAUZE/BANDAGES/DRESSINGS) IMPLANT
BINDER BREAST MEDIUM (GAUZE/BANDAGES/DRESSINGS) ×1 IMPLANT
BINDER BREAST XLRG (GAUZE/BANDAGES/DRESSINGS) IMPLANT
BINDER BREAST XXLRG (GAUZE/BANDAGES/DRESSINGS) IMPLANT
BLADE SURG 10 STRL SS (BLADE) ×2 IMPLANT
BLADE SURG 15 STRL LF DISP TIS (BLADE) ×2 IMPLANT
BLADE SURG 15 STRL SS (BLADE) ×2
BNDG CMPR 5X4 CHSV STRCH STRL (GAUZE/BANDAGES/DRESSINGS) ×2
BNDG COHESIVE 4X5 TAN STRL LF (GAUZE/BANDAGES/DRESSINGS) ×2 IMPLANT
CANISTER SUC SOCK COL 7IN (MISCELLANEOUS) IMPLANT
CANISTER SUCT 1200ML W/VALVE (MISCELLANEOUS) ×2 IMPLANT
CHLORAPREP W/TINT 26 (MISCELLANEOUS) ×2 IMPLANT
CLIP TI LARGE 6 (CLIP) ×2 IMPLANT
CLIP TI MEDIUM 6 (CLIP) ×6 IMPLANT
CLIP TI WIDE RED SMALL 6 (CLIP) IMPLANT
COVER MAYO STAND STRL (DRAPES) ×3 IMPLANT
COVER PROBE CYLINDRICAL 5X96 (MISCELLANEOUS) ×3 IMPLANT
DERMABOND ADVANCED .7 DNX12 (GAUZE/BANDAGES/DRESSINGS) ×2 IMPLANT
DRAIN CHANNEL 19F RND (DRAIN) IMPLANT
DRAIN PENROSE .5X12 LATEX STL (DRAIN) IMPLANT
DRAPE UTILITY XL STRL (DRAPES) ×2 IMPLANT
ELECT BLADE 4.0 EZ CLEAN MEGAD (MISCELLANEOUS) ×2
ELECT COATED BLADE 2.86 ST (ELECTRODE) ×2 IMPLANT
ELECT REM PT RETURN 9FT ADLT (ELECTROSURGICAL) ×2
ELECTRODE BLDE 4.0 EZ CLN MEGD (MISCELLANEOUS) ×1 IMPLANT
ELECTRODE REM PT RTRN 9FT ADLT (ELECTROSURGICAL) ×2 IMPLANT
GAUZE PAD ABD 8X10 STRL (GAUZE/BANDAGES/DRESSINGS) ×2 IMPLANT
GAUZE SPONGE 4X4 12PLY STRL LF (GAUZE/BANDAGES/DRESSINGS) ×2 IMPLANT
GLOVE BIO SURGEON STRL SZ 6 (GLOVE) ×2 IMPLANT
GLOVE BIOGEL PI IND STRL 6.5 (GLOVE) ×2 IMPLANT
GLOVE BIOGEL PI IND STRL 7.5 (GLOVE) ×2 IMPLANT
GOWN STRL REUS W/ TWL LRG LVL3 (GOWN DISPOSABLE) ×2 IMPLANT
GOWN STRL REUS W/TWL 2XL LVL3 (GOWN DISPOSABLE) ×2 IMPLANT
GOWN STRL REUS W/TWL LRG LVL3 (GOWN DISPOSABLE) ×2
KIT MARKER MARGIN INK (KITS) ×2 IMPLANT
LIGHT WAVEGUIDE WIDE FLAT (MISCELLANEOUS) ×1 IMPLANT
NDL HYPO 25X1 1.5 SAFETY (NEEDLE) ×1 IMPLANT
NDL SAFETY ECLIP 18X1.5 (MISCELLANEOUS) ×2 IMPLANT
NEEDLE HYPO 25X1 1.5 SAFETY (NEEDLE) ×2 IMPLANT
NS IRRIG 1000ML POUR BTL (IV SOLUTION) ×2 IMPLANT
PACK BASIN DAY SURGERY FS (CUSTOM PROCEDURE TRAY) ×2 IMPLANT
PACK UNIVERSAL I (CUSTOM PROCEDURE TRAY) ×2 IMPLANT
PENCIL SMOKE EVACUATOR (MISCELLANEOUS) ×2 IMPLANT
PIN SAFETY STERILE (MISCELLANEOUS) IMPLANT
SLEEVE SCD COMPRESS KNEE MED (STOCKING) ×2 IMPLANT
SPIKE FLUID TRANSFER (MISCELLANEOUS) IMPLANT
SPONGE T-LAP 18X18 ~~LOC~~+RFID (SPONGE) ×4 IMPLANT
STAPLER VISISTAT 35W (STAPLE) IMPLANT
STOCKINETTE IMPERVIOUS LG (DRAPES) ×2 IMPLANT
STRIP CLOSURE SKIN 1/2X4 (GAUZE/BANDAGES/DRESSINGS) ×2 IMPLANT
SUCT RESERVOIR 100CC (MISCELLANEOUS) IMPLANT
SUT ETHILON 2 0 FS 18 (SUTURE) IMPLANT
SUT MNCRL AB 4-0 PS2 18 (SUTURE) ×3 IMPLANT
SUT MON AB 5-0 PS2 18 (SUTURE) IMPLANT
SUT SILK 2 0 SH (SUTURE) IMPLANT
SUT VIC AB 2-0 SH 27 (SUTURE) ×2
SUT VIC AB 2-0 SH 27XBRD (SUTURE) ×2 IMPLANT
SUT VIC AB 3-0 SH 27 (SUTURE) ×2
SUT VIC AB 3-0 SH 27X BRD (SUTURE) ×2 IMPLANT
SUT VICRYL 3-0 CR8 SH (SUTURE) ×2 IMPLANT
SWAB COLLECTION DEVICE MRSA (MISCELLANEOUS) IMPLANT
SWAB CULTURE ESWAB REG 1ML (MISCELLANEOUS) IMPLANT
SYR BULB EAR ULCER 3OZ GRN STR (SYRINGE) ×2 IMPLANT
SYR BULB IRRIG 60ML STRL (SYRINGE) IMPLANT
SYR CONTROL 10ML LL (SYRINGE) ×2 IMPLANT
TOWEL GREEN STERILE FF (TOWEL DISPOSABLE) ×2 IMPLANT
TRACER MAGTRACE VIAL (MISCELLANEOUS) ×1 IMPLANT
TRAY FAXITRON CT DISP (TRAY / TRAY PROCEDURE) ×2 IMPLANT
TUBE CONNECTING 20X1/4 (TUBING) ×2 IMPLANT
YANKAUER SUCT BULB TIP NO VENT (SUCTIONS) ×2 IMPLANT

## 2022-08-14 NOTE — Transfer of Care (Signed)
Immediate Anesthesia Transfer of Care Note  Patient: Nicole Browning  Procedure(s) Performed: RIGHT BREAST LUMPECTOMY WITH RADIOACTIVE SEED AND SENTINEL LYMPH NODE BIOPSY (Right: Breast)  Patient Location: PACU  Anesthesia Type:GA combined with regional for post-op pain  Level of Consciousness: drowsy, patient cooperative, and responds to stimulation  Airway & Oxygen Therapy: Patient Spontanous Breathing and Patient connected to face mask oxygen  Post-op Assessment: Report given to RN and Post -op Vital signs reviewed and stable  Post vital signs: Reviewed and stable  Last Vitals:  Vitals Value Taken Time  BP 130/85 08/14/22 1135  Temp    Pulse 87 08/14/22 1135  Resp 19 08/14/22 1135  SpO2 100 % 08/14/22 1135  Vitals shown include unvalidated device data.  Last Pain:  Vitals:   08/14/22 0720  TempSrc: Oral  PainSc: 0-No pain      Patients Stated Pain Goal: 5 (08/14/22 0720)  Complications: No notable events documented.

## 2022-08-14 NOTE — Progress Notes (Signed)
Assisted Dr. Hodierne with right, pectoralis, ultrasound guided block. Side rails up, monitors on throughout procedure. See vital signs in flow sheet. Tolerated Procedure well. 

## 2022-08-14 NOTE — H&P (Signed)
REFERRING PHYSICIAN: Frederico Hamman, MD  PROVIDER: Matthias Hughs, MD  Care Team: Patient Care Team: Grayce Sessions, NP as PCP - General (Internal Medicine) Beavers, Kevin Fenton, MD (Gastroenterology) Daiva Eves, Juanita Craver, MD (Infectious Diseases) Donell Beers Daphene Calamity, MD as Consulting Provider (Surgical Oncology) Sabas Sous, MD (Hematology and Oncology)  MRN: [id number] DOB: Jul 27, 1970 DATE OF ENCOUNTER: 07/21/2022  Subjective  Chief Complaint: New Consultation and Breast Cancer  History of Present Illness: Nicole Browning is a 52 y.o. female who is seen today as an office consultation at the request of Dr. Crissie Figures for evaluation of New Consultation and Breast Cancer .  Patient presents with new right breast cancer may 2024. The patient has been having issues on and off with a left breast abscess. When this was seen, diagnostic mammogram and ultrasound were ordered. He had this and had multiple findings. Patient had a suspicious 2.1 cm mass on the right 11:00 can corresponding to distortion, there is a 0.9 cm mass at 10:00. There is an indeterminate right axillary lymph node. There is a 3.8 cm abscess in the retroareolar areolar left breast. There was a likely benign 1 cm mass in the left breast 2:00 favored to be a reactive lymph node. There were also multiple reactive appearing lymph nodes in the left axilla. She underwent core needle biopsy of both of the right-sided masses. 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 Ki-67 was 1%.  Family cancer history: patient's brother had leukemia, maternal aunt had pancreatic cancer. She is unclear what other cancers have run in the family.  Work: patient cleans houses  Diagnostic mammogram/us 06/17/22 BCG ACR Breast Density Category c: The breasts are heterogeneously dense, which may obscure small masses.  FINDINGS: There is increased  density in the retroareolar left breast, likely corresponding with the patient's abscess. There is an oval circumscribed mass in the upper outer posterior left breast about 9 mm.  A small asymmetry is in the central to slightly inferior right breast on the initial routine MLO image, however it resolves on the spot compression tomosynthesis images consistent with overlapping fibroglandular tissue. There is a small distortion in the far superior posterior right breast.  Ultrasound of the retroareolar left breast demonstrates a hypoechoic oval mass with indistinct margins and marked interior blood flow measuring 3.8 x 2.2 x 3.1 cm. Mobile fluid is noted within pockets of this mass.  Ultrasound of the left breast at 2 o'clock, 5 cm from the nipple demonstrates a hypoechoic oval circumscribed mass measuring 1.3 x 0.7 x 0.8 cm. There is no clear discernible hilum, though there is a blood vessel in close proximity to the mass with marked blood flow within the central portion of the mass suggesting that this may represent a lymph node.  There are several prominent lymph nodes without clear fatty hila in the left axilla.  Ultrasound of the right breast at 11 o'clock, 4 cm from the nipple demonstrates an irregular hypoechoic vascular mass measuring up to 2.1 cm in an oblique plane. In the radial and anti radial planes the mass measures 1.3 x 0.7 x 1.3 cm.  Approximately 1.5 cm inferior to this mass at 10 o'clock, 3 cm from the nipple is a smaller mass measuring 0.9 x 0.3 x 0.8 cm. Together, the 2 masses span 3.4 cm.  Ultrasound of the right axilla demonstrates 1 prominent lymph node without a clear echogenic  hilum, and a cortex measuring 5 mm.  IMPRESSION: 1. There is a highly suspicious 2.1 cm mass in the right breast at 11 o'clock, likely corresponding with the area of distortion seen mammographically.  2. There is a 0.9 cm mass in the right breast at 10 o'clock, 3 cm from the  nipple. This is 1.5 cm away from the larger mass at 11 o'clock.  3. There is 1 prominent indeterminate right axillary lymph node.  4. There is a 3.8 cm abscess in the retroareolar left breast.  5. There is a likely benign 1.3 cm mass in the left breast at 2 o'clock, favored to represent a reactive lymph node.  6. There are multiple enlarged lymph nodes in the left axilla, very likely to be reactive due to the infection in the left breast.  RECOMMENDATION: 1. Ultrasound-guided aspiration is recommended for the retroareolar left breast. I advised the patient to go fill the antibiotic prescription that was given to her in the ER and to begin taking not right away.  2. Ultrasound-guided biopsy is recommended for the 2 masses in the upper-outer right breast at 10 and 11 o'clock.  3. Ultrasound-guided biopsy is recommended for the prominent right axillary lymph node.  4. Monitoring of the left axillary lymph nodes, and the probable lymph node in the left breast at 2 o'clock will be recommended following resolution of the left breast abscess to ensure that they return to normal size.  Due to the patient's scheduling needs, the procedures have been scheduled for 06/24/2022.  I have discussed the findings and recommendations with the patient. If applicable, a reminder letter will be sent to the patient regarding the next appointment.  BI-RADS CATEGORY 5: Highly suggestive of malignancy.   Pathology core needle biopsy: 06/24/22  1. Breast, right, needle core biopsy, 11:00 4 cmfn, coil clip - INVASIVE DUCTAL CARCINOMA, SEE NOTE - TUBULE FORMATION: SCORE 1 - NUCLEAR PLEOMORPHISM: SCORE 2 - MITOTIC COUNT: SCORE 1 - TOTAL SCORE: 4 - OVERALL GRADE: 1 - LYMPHOVASCULAR INVASION: NOT IDENTIFIED - CANCER LENGTH: 0.2 CM - CALCIFICATIONS: PRESENT, WITHIN ADJACENT BREAST PARENCHYMA - OTHER FINDINGS: FIBROCYSTIC CHANGE WITH COLUMNAR CELL CHANGE AND CALCIFICATIONS 2. Breast, right, needle  core biopsy, 10:00 3 cmfn, wing clip - FIBROCYSTIC CHANGE WITH APOCRINE METAPLASIA - NEGATIVE FOR CARCINOMA  Receptors: ER 90% strong PR 10% moderate Ki 67 1%, Her 2 negative  Review of Systems: A complete review of systems was obtained from the patient. I have reviewed this information and discussed as appropriate with the patient. See HPI as well for other ROS.  Medical History: Past Medical History: Diagnosis Date GERD (gastroesophageal reflux disease)  Patient Active Problem List Diagnosis Malignant neoplasm of upper-outer quadrant of right breast in female, estrogen receptor positive (CMS/HHS-HCC) Smoking Prediabetes Tobacco use GERD (gastroesophageal reflux disease) IBD (inflammatory bowel disease) Left breast abscess Family history of cancer  Past Surgical History: Procedure Laterality Date Irrigation and debridement abscess 02/24/2016 ESOPHAGOGASTRODUODENOSCOPY (EGD) WITH PROPOFOL N/A 09/29/2020 R breast biposy 06/24/2022   Allergies Allergen Reactions Sulfamethoxazole-Trimethoprim Hives Immediate body wide hives Diazepam Nausea Oxycodone-Acetaminophen Nausea and Other (See Comments)  No current outpatient medications on file prior to visit.  No current facility-administered medications on file prior to visit.  Family History Problem Relation Age of Onset Diabetes Mother Leukemia Brother   Social History  Tobacco Use Smoking Status Some Days Types: Cigarettes Smokeless Tobacco Not on file   Social History  Socioeconomic History Marital status: Single Tobacco Use Smoking status: Some Days  Types: Cigarettes Vaping Use Vaping status: Unknown Substance and Sexual Activity Alcohol use: Defer Drug use: Defer  Objective:  Vitals: 07/21/22 1339 BP: 107/80 Pulse: 84 Temp: 36.7 C (98 F) SpO2: 98% Weight: 52.2 kg (115 lb) Height: 167.6 cm (5\' 6" )  Body mass index is 18.56 kg/m.  Gen: No acute distress. Well nourished and well  groomed. Neurological: Alert and oriented to person, place, and time. Coordination normal. Head: Normocephalic and atraumatic. Eyes: Conjunctivae are normal. Pupils are equal, round, and reactive to light. No scleral icterus. Neck: Normal range of motion. Neck supple. No tracheal deviation or thyromegaly present. Cardiovascular: Normal rate, regular rhythm, normal heart sounds and intact distal pulses. Exam reveals no gallop and no friction rub. No murmur heard. Breast: left nipple with swelling and some fluctuance superior areola. No palpable masses in this location. The center of the LEFT nipple has retracted. The right breast has thickening in the UOQ presumably with the biopsy site. One small palpable axillary node. Respiratory: Effort normal. No respiratory distress. No chest wall tenderness. Breath sounds normal. No wheezes, rales or rhonchi. GI: Soft. Bowel sounds are normal. The abdomen is soft and nontender. There is no rebound and no guarding. Musculoskeletal: Normal range of motion. Extremities are nontender. Lymphadenopathy: No cervical, preauricular, postauricular or axillary adenopathy is present Skin: Skin is warm and dry. No rash noted. No diaphoresis. No erythema. No pallor. No clubbing, cyanosis, or edema. Psychiatric: Normal mood and affect. Behavior is normal. Judgment and thought content normal.  Labs N/a  Assessment and Plan:  ICD-10-CM 1. Left breast abscess N61.1 traMADoL (ULTRAM) 50 mg tablet DISCONTINUED: cephalexin (KEFLEX) 500 MG capsule  2. Malignant neoplasm of upper-outer quadrant of right breast in female, estrogen receptor positive (CMS/HHS-HCC) C50.411 Ambulatory Referral to Physical Therapy Z17.0 Ambulatory Referral to Radiation Oncology Ambulatory Referral to Cancer Genetics - [id number]  3. Smoking F17.200  4. Family history of cancer Z80.9 Ambulatory Referral to Cancer Genetics - [phone]   Patient has a clinical diagnosis of clinical T1CN 0-1 right  breast cancer. The axillary lymph node on the right was very small, and I think this is only able to be palpated because of health and that she is.  I discussed with the patient a seed localized lumpectomy and sentinel lymph node biopsy. Her left breast also appears to have a small fluctuant area.  I will plan to do a seed localized lumpectomy and sentinel lymph node biopsy. If the left areola has still a fluctuant area, we will incise and drain that. I am writing her for another 2 weeks of antibiotics to minimize the risk of this given her other side infected.  I am referring her to medical and radiation oncology as well as physical therapy and genetics.  The surgical procedure was described to the patient. I discussed the incision type and location and that we will may need radiology involved with a seed marker and/or sentinel node.  We discussed the risks bleeding, infection, damage to other structures, need for further procedures/surgeries. We discussed the risk of seroma. The patient was advised if the breast has cancer, we may need to go back to surgery for additional tissue to obtain negative margins or for a lymph node biopsy. The patient was advised that these are the most common complications, but that others can occur as well. I discussed the risk of alteration in breast contour or size. I discussed risk of chronic pain. There are rare instances of heart/lung issues post op as well  as blood clots.  They were advised against taking aspirin or other anti-inflammatory agents/blood thinners the week before surgery.

## 2022-08-14 NOTE — Anesthesia Procedure Notes (Signed)
Anesthesia Regional Block: Pectoralis block   Pre-Anesthetic Checklist: , timeout performed,  Correct Patient, Correct Site, Correct Laterality,  Correct Procedure, Correct Position, site marked,  Risks and benefits discussed,  Surgical consent,  Pre-op evaluation,  At surgeon's request and post-op pain management  Laterality: Right  Prep: chloraprep       Needles:  Injection technique: Single-shot  Needle Type: Echogenic Needle     Needle Length: 9cm  Needle Gauge: 21     Additional Needles:   Narrative:  Start time: 08/14/2022 8:18 AM End time: 08/14/2022 8:27 AM Injection made incrementally with aspirations every 5 mL.  Performed by: Personally  Anesthesiologist: Achille Rich, MD  Additional Notes: Pt tolerated the procedure well.

## 2022-08-14 NOTE — Interval H&P Note (Signed)
History and Physical Interval Note:  08/14/2022 7:49 AM  Nicole Browning  has presented today for surgery, with the diagnosis of RIGHT BREAST CANCER. The various methods of treatment have been discussed with the patient and family. After consideration of risks, benefits and other options for treatment, the patient has consented to  Procedure(s): RIGHT BREAST LUMPECTOMY WITH RADIOACTIVE SEED AND SENTINEL LYMPH NODE BIOPSY (Right) as a surgical intervention.  The patient's history has been reviewed, patient examined, no change in status, stable for surgery.  I have reviewed the patient's chart and labs.  Questions were answered to the patient's satisfaction.     Almond Lint

## 2022-08-14 NOTE — Op Note (Signed)
Right Breast Radioactive seed localized lumpectomy and sentinel lymph node biopsy  Indications: This patient presents with history of right breast cancer, cT1cN0 grade 1 invasive ductal carcinoma, upper outer quadrant, ER+/PR+/Her2-, Ki 67 1%  Pre-operative Diagnosis: right breast cancer  Post-operative Diagnosis: Same  Surgeon: Almond Lint   Assistant: Jenell Milliner, RNFA  Anesthesia: General endotracheal anesthesia  ASA Class: 2  Procedure Details  The patient was seen in the Holding Room. The risks, benefits, complications, treatment options, and expected outcomes were discussed with the patient. The possibilities of bleeding, infection, the need for additional procedures, failure to diagnose a condition, and creating a complication requiring transfusion or operation were discussed with the patient. The patient concurred with the proposed plan, giving informed consent.  The site of surgery properly noted/marked. The patient was taken to Operating Room # 8, identified, and the procedure verified as right Breast Seed localized Lumpectomy with sentinel lymph node biopsy. The right arm, breast, and chest were prepped and draped in standard fashion. A Time Out was held and the above information confirmed. The MagTrace was injected into the subareolar position.  The lumpectomy was performed by creating a superolateral circuamreolar incision near the previously placed radioactive seed.  Dissection was carried down to around the point of maximum signal intensity. The cautery was used to perform the dissection.  Additional margins were obtained at anterior, superior, inferior, lateral, and medial margins.  Hemostasis was achieved with cautery. The edges of the cavity were marked with large clips.   The specimen was inked with the margin marker paint kit.    Specimen radiography confirmed inclusion of the mammographic lesion, the clip, and the seed.  The background signal in the breast was zero.   The  wound was irrigated and reinspected for hemostasis.  The skin was then closed with 3-0 vicryl in layers and 4-0 monocryl subcuticular suture.    Using a Sentimag probe, right axillary sentinel nodes were identified transcutaneously.  An oblique incision was created below the axillary hairline.  Dissection was carried through the clavipectoral fascia.  Five level two axillary sentinel nodes were removed.  Counts per second were 900, 2900, 600, 400, and 950.    The background count was 10 cps.  The wound was irrigated.  Hemostasis was achieved with cautery.  The axillary incision was closed with a 3-0 vicryl deep dermal interrupted sutures and a 4-0 monocryl subcuticular closure.    Sterile dressings were applied. At the end of the operation, all sponge, instrument, and needle counts were correct.  Findings: grossly clear surgical margins and no adenopathy, anterior margin is skin, posterior margin pectoralis  Estimated Blood Loss:  min         Specimens: right breast tissue with seed, additional medial, lateral, superior, inferior, and anterior margins, and five right axillary sentinel lymph nodes.    The first node had they hydromark clip.           Complications:  None; patient tolerated the procedure well.         Disposition: PACU - hemodynamically stable.         Condition: stable

## 2022-08-14 NOTE — Anesthesia Postprocedure Evaluation (Signed)
Anesthesia Post Note  Patient: Nicole Browning  Procedure(s) Performed: RIGHT BREAST LUMPECTOMY WITH RADIOACTIVE SEED AND SENTINEL LYMPH NODE BIOPSY (Right: Breast)     Patient location during evaluation: PACU Anesthesia Type: Regional and General Level of consciousness: awake and alert Pain management: pain level controlled Vital Signs Assessment: post-procedure vital signs reviewed and stable Respiratory status: spontaneous breathing, nonlabored ventilation, respiratory function stable and patient connected to nasal cannula oxygen Cardiovascular status: blood pressure returned to baseline and stable Postop Assessment: no apparent nausea or vomiting Anesthetic complications: no   No notable events documented.  Last Vitals:  Vitals:   08/14/22 1200 08/14/22 1215  BP: 130/81 123/80  Pulse: 75 73  Resp: 19 17  Temp:    SpO2: 100% 96%    Last Pain:  Vitals:   08/14/22 1215  TempSrc:   PainSc: Asleep                 Conor Filsaime S

## 2022-08-14 NOTE — Anesthesia Procedure Notes (Signed)
Procedure Name: LMA Insertion Date/Time: 08/14/2022 9:18 AM  Performed by: Cleda Clarks, CRNAPre-anesthesia Checklist: Patient identified, Emergency Drugs available, Suction available and Patient being monitored Patient Re-evaluated:Patient Re-evaluated prior to induction Oxygen Delivery Method: Circle system utilized Preoxygenation: Pre-oxygenation with 100% oxygen Induction Type: IV induction Ventilation: Mask ventilation without difficulty LMA: LMA inserted LMA Size: 4.0 Number of attempts: 1 Placement Confirmation: positive ETCO2 Tube secured with: Tape Dental Injury: Teeth and Oropharynx as per pre-operative assessment

## 2022-08-14 NOTE — Discharge Instructions (Addendum)
No tylenol until 1:30 p.m.  [facility] [employer] [facility] Office Phone Number [phone]  BREAST BIOPSY/ PARTIAL MASTECTOMY: POST OP INSTRUCTIONS  Always review your discharge instruction sheet given to you by the facility where your surgery was performed.  IF YOU HAVE DISABILITY OR FAMILY LEAVE FORMS, YOU MUST BRING THEM TO THE OFFICE FOR PROCESSING.  DO NOT GIVE THEM TO YOUR DOCTOR.  Take 2 tylenol (acetominophen) three times a day for 3 days.  If you still have pain, add ibuprofen with food in between if able to take this (if you have kidney issues or stomach issues, do not take ibuprofen).  If both of those are not enough, add the narcotic pain pill.  If you find you are needing a lot of this overnight after surgery, call the next morning for a refill.    Prescriptions will not be filled after 5pm or on week-ends. Take your usually prescribed medications unless otherwise directed You should eat very light the first 24 hours after surgery, such as soup, crackers, pudding, etc.  Resume your normal diet the day after surgery. Most patients will experience some swelling and bruising in the breast.  Ice packs and a good support bra will help.  Swelling and bruising can take several days to resolve.  It is common to experience some constipation if taking pain medication after surgery.  Increasing fluid intake and taking a stool softener will usually help or prevent this problem from occurring.  A mild laxative (Milk of Magnesia or Miralax) should be taken according to package directions if there are no bowel movements after 48 hours. Unless discharge instructions indicate otherwise, you may remove your bandages 48 hours after surgery, and you may shower at that time.  You may have steri-strips (small skin tapes) in place directly over the incision.  These strips should be left on the skin at least for for 7-10 days.    ACTIVITIES:  You may resume regular daily activities (gradually increasing)  beginning the next day.  Wearing a good support bra or sports bra (or the breast binder) minimizes pain and swelling.  You may have sexual intercourse when it is comfortable. No heavy lifting for 1-2 weeks (not over around 10 pounds).  You may drive when you no longer are taking prescription pain medication, you can comfortably wear a seatbelt, and you can safely maneuver your car and apply brakes. RETURN TO WORK:  __________3-14 days depending on job. _______________ Nicole Browning should see your doctor in the office for a follow-up appointment approximately two weeks after your surgery.  Your doctor's nurse will typically make your follow-up appointment when she calls you with your pathology report.  Expect your pathology report 3-4 business days after your surgery.  You may call to check if you do not hear from [place] after three days.   WHEN TO CALL YOUR DOCTOR: Fever over 101.0 Nausea and/or vomiting. Extreme swelling or bruising. Continued bleeding from incision. Increased pain, redness, or drainage from the incision.  The clinic staff is available to answer your questions during regular business hours.  Please don't hesitate to call and ask to speak to one of the nurses for clinical concerns.  If you have a medical emergency, go to the nearest emergency room or call 911.  A surgeon from [facility] is always on call at the hospital.  For further questions, please visit [email or web address]    Post Anesthesia Home Care Instructions  Activity: Get plenty of rest for the remainder of the  day. A responsible individual must stay with you for 24 hours following the procedure.  For the next 24 hours, DO NOT: -Drive a car -Advertising copywriter -Drink alcoholic beverages -Take any medication unless instructed by your physician -Make any legal decisions or sign important papers.  Meals: Start with liquid foods such as gelatin or soup. Progress to regular foods as tolerated. Avoid  greasy, spicy, heavy foods. If nausea and/or vomiting occur, drink only clear liquids until the nausea and/or vomiting subsides. Call your physician if vomiting continues.  Special Instructions/Symptoms: Your throat may feel dry or sore from the anesthesia or the breathing tube placed in your throat during surgery. If this causes discomfort, gargle with warm salt water. The discomfort should disappear within 24 hours.  If you had a scopolamine patch placed behind your ear for the management of post- operative nausea and/or vomiting:  1. The medication in the patch is effective for 72 hours, after which it should be removed.  Wrap patch in a tissue and discard in the trash. Wash hands thoroughly with soap and water. 2. You may remove the patch earlier than 72 hours if you experience unpleasant side effects which may include dry mouth, dizziness or visual disturbances. 3. Avoid touching the patch. Wash your hands with soap and water after contact with the patch.

## 2022-08-14 NOTE — Anesthesia Preprocedure Evaluation (Signed)
Anesthesia Evaluation  Patient identified by MRN, date of birth, ID band Patient awake    Reviewed: Allergy & Precautions, H&P , NPO status , Patient's Chart, lab work & pertinent test results  Airway Mallampati: II   Neck ROM: full    Dental   Pulmonary Current Smoker   breath sounds clear to auscultation       Cardiovascular negative cardio ROS  Rhythm:regular Rate:Normal     Neuro/Psych    GI/Hepatic PUD,GERD  ,,Ulcerative colitis   Endo/Other    Renal/GU      Musculoskeletal   Abdominal   Peds  Hematology   Anesthesia Other Findings   Reproductive/Obstetrics                             Anesthesia Physical Anesthesia Plan  ASA: 2  Anesthesia Plan: General   Post-op Pain Management: Regional block*   Induction: Intravenous  PONV Risk Score and Plan: 2 and Ondansetron, Dexamethasone, Midazolam and Treatment may vary due to age or medical condition  Airway Management Planned: LMA  Additional Equipment:   Intra-op Plan:   Post-operative Plan: Extubation in OR  Informed Consent: I have reviewed the patients History and Physical, chart, labs and discussed the procedure including the risks, benefits and alternatives for the proposed anesthesia with the patient or authorized representative who has indicated his/her understanding and acceptance.     Dental advisory given  Plan Discussed with: CRNA, Anesthesiologist and Surgeon  Anesthesia Plan Comments:        Anesthesia Quick Evaluation

## 2022-08-15 ENCOUNTER — Encounter (HOSPITAL_BASED_OUTPATIENT_CLINIC_OR_DEPARTMENT_OTHER): Payer: Self-pay | Admitting: General Surgery

## 2022-08-19 ENCOUNTER — Telehealth: Payer: Self-pay

## 2022-08-19 ENCOUNTER — Ambulatory Visit: Payer: Medicaid Other | Admitting: Hematology and Oncology

## 2022-08-19 NOTE — Telephone Encounter (Signed)
CSW spoke with patient and provided information on therapists again. E-mailed to patient as well per her request. Pt states that she can call to schedule.   [name] E Reana Chacko, LCSW

## 2022-08-19 NOTE — Telephone Encounter (Signed)
Pt called and LVM, this RN unable to understand message left. Called and LVM asking for call back.

## 2022-08-19 NOTE — Telephone Encounter (Signed)
Pt called and stated she is "losing it" and struggling with anxiety/depression. Pt denies suicidal ideation or plan. Pt states she forgot the name of the therapist she was referred to and lost the number for SW. [place] via secure chat to give Pt a call, Marcelino Duster confirms she will call Pt today. Pt verbalized understanding.

## 2022-08-21 ENCOUNTER — Telehealth: Payer: Self-pay | Admitting: *Deleted

## 2022-08-21 NOTE — Telephone Encounter (Signed)
This RN received call from the patient stating she is still having pain since her surgery last week.  Note pt had R breast lumpectomy for a new breast cancer diagnosis.  Noted pt to establish care here for above 6/27 though she was seen remotely by Dr Al Pimple for anemia.  This RN informed pt of need to call her surgeon at [facility]- explained that she is currently still under active post surgery care and her issue needs to be assessed and addressed by them for best outcome.  Pt verbalized understanding and appreciation.

## 2022-08-22 ENCOUNTER — Encounter: Payer: Self-pay | Admitting: Hematology and Oncology

## 2022-08-22 NOTE — Progress Notes (Signed)
Patient called regarding applying for financial assistance. Advised once her tx plan has been established she will be able to apply for one-time $1000 [employer] grant to assist with personal expenses while going through treatment. Advised what would be needed to apply.  She also wanted to know about next appointments. Advised of her dr and lab appointment and time on 6/27. She verbalized understanding.  She has my card for any additional financial questions or concerns.

## 2022-08-25 LAB — SURGICAL PATHOLOGY

## 2022-08-26 ENCOUNTER — Telehealth: Payer: Self-pay | Admitting: Licensed Clinical Social Worker

## 2022-08-26 NOTE — Telephone Encounter (Signed)
TC to pt per request of RN who said pt called stating that the therapy options did not work for her.  CSW spoke with pt although could not hear a response from pt when trying to determine the issue with connecting for therapy.  Sent additional counseling options via e-mail.   Lalisa Kiehn E Majorie Santee, LCSW

## 2022-08-28 ENCOUNTER — Inpatient Hospital Stay: Payer: Medicaid Other

## 2022-08-28 ENCOUNTER — Inpatient Hospital Stay: Payer: Medicaid Other | Admitting: Hematology and Oncology

## 2022-08-28 ENCOUNTER — Other Ambulatory Visit: Payer: Self-pay

## 2022-08-28 VITALS — BP 123/80 | HR 93 | Temp 97.7°F | Resp 15 | Wt 119.0 lb

## 2022-08-28 DIAGNOSIS — C50411 Malignant neoplasm of upper-outer quadrant of right female breast: Secondary | ICD-10-CM | POA: Diagnosis present

## 2022-08-28 DIAGNOSIS — Z170 Estrogen receptor positive status [ER+]: Secondary | ICD-10-CM | POA: Diagnosis not present
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
## 2022-08-28 NOTE — Assessment & Plan Note (Signed)
This is a pleasant 52 year old female patient with newly diagnosed right breast invasive ductal carcinoma, grade 1, ER/PR positive HER2 negative status postlumpectomy with final pathology showing pT2 N0 or stage IB breast cancer referred to medical oncology for additional recommendations.  She is healing well except for ongoing pain and swelling in the area of surgery.  She has a postop appointment tomorrow.  She denies any fevers or chills today.  She was very emotional and tearful about her ongoing social issues and requests some help for resources.  Physical examination, warm and tender right breast with swelling at the site of surgery, otherwise healing well. Since she has early stage breast cancer which is ER/PR positive, we have reviewed about proceeding with Oncotype Dx testing.  Given her low-grade, low proliferation index and strong ER/PR positivity, she may not need adjuvant chemo but she understands that the Oncotype DX testing will assist as in determining this.  She is willing to proceed with this.  If she is not a candidate for adjuvant chemotherapy, she will proceed with adjuvant radiation followed by antiestrogen therapy.  She is premenopausal hence we have discussed mostly about tamoxifen today.  I have reviewed the mechanism of action of tamoxifen, adverse affects of tamoxifen including but not limited to fatigue, hot flashes, vaginal discharge, risk of DVT/PE, endometrial hyperplasia, endometrial carcinoma and a benefit on bone density.  She will return to clinic after adjuvant radiation to review the role of antiestrogen therapy and initiated.  I have sent an in basket message to our social worker team to connect and to assist with any resources if possible.  I have also sent an in basket message to Dr. Donell Beers about the findings from the right breast today

## 2022-08-29 ENCOUNTER — Telehealth: Payer: Self-pay | Admitting: *Deleted

## 2022-08-29 ENCOUNTER — Inpatient Hospital Stay: Payer: Medicaid Other | Admitting: Licensed Clinical Social Worker

## 2022-08-29 ENCOUNTER — Telehealth: Payer: Self-pay | Admitting: Hematology and Oncology

## 2022-08-29 ENCOUNTER — Encounter: Payer: Self-pay | Admitting: *Deleted

## 2022-08-29 NOTE — Telephone Encounter (Signed)
Ordered oncotype per Dr. Iruku. Sent requisition to pathology and exact sciences. 

## 2022-08-29 NOTE — Progress Notes (Signed)
Location of Breast Cancer: Malignant neoplasm of upper-outer quadrant of right breast in female, estrogen receptor positive, DCIS right breast  Histology per Pathology Report:  08/14/2022  FINAL MICROSCOPIC DIAGNOSIS:  A. 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  Negative for DCIS     - Closest margin, invasive: Medial and Posterior, 1.0 mm, Anterior, 5.0 mm; Superior, 9.0 mm     - Closet margin, DCIS: Medial, less than 1.0 mm; Posterior, 1.0 mm; Anterior, 5.0 mm; Superior 8.0 mm - Lymphovascular invasion (LVI): Not identified - Calcifications: Present, associated with carcinoma and DCIS - Prognostic markers:  ER positive (90%), PR positive (10%), Her2 negative (1+), Ki-67 (1%) - Other: Fibrocystic changes, focal fibroadenomatoid change, adenosis, flat epithelial atypia (FEA) biopsy site changes - See oncology table and comment  INVASIVE CARCINOMA OF THE BREAST:  Resection  Procedure: Lumpectomy with sentinel node excision Specimen Laterality: Right Histologic Type: Ductal carcinoma Histologic Grade:      Glandular (Acinar)/Tubular Differentiation: 2      Nuclear Pleomorphism: 2      Mitotic Rate: 1      Overall Grade: 1 Tumor Size: 2.4 cm (tumor involving 6 blocks, each 4 mm) Ductal Carcinoma In Situ: Present, cribriform type Lymphatic and/or Vascular Invasion: Not identified Treatment Effect in the Breast: No known presurgical therapy Margins: All margins negative for invasive carcinoma      Distance from Closest Margin (mm): Medial and Posterior, 1.0 mm, Anterior, 5.0 mm; Superior, 9.0 mm       Specify Closest Margin (required only if <68mm): See above DCIS Margins: Uninvolved by DCIS      Distance from Closest Margin (mm): Medial, less than 1.0 mm; Posterior, 1.0 mm;  Anterior, 5.0 mm; Superior 8.0 mm      Specify Closest Margin (required only if <68mm): See above Regional Lymph Nodes:      Number of Lymph Nodes Examined: 5      Number of Sentinel Nodes Examined: 5      Number of Lymph Nodes with Macrometastases (>2 mm): 0      Number of Lymph Nodes with Micrometastases: 0      Number of Lymph Nodes with Isolated Tumor Cells (=0.2 mm or =200 cells): 0      Size of Largest Metastatic Deposit (mm): N/A      Extranodal Extension: N/A Distant Metastasis:      Distant Site(s) Involved: N/A Breast Biomarker Testing Performed on Previous Biopsy:      Testing Performed on Case Number: [id number]            Estrogen Receptor: 90%, positive, strong staining intensity            Progesterone Receptor: 10%, positive, moderate staining intensity            HER2: Negative (1+)            Ki-67: 1% Pathologic Stage Classification (pTNM, AJCC 8th Edition): pT2, pN0 (sn) Representative Tumor Block: A1 Comment(s): The final diagnosis in part A and oncology table is reflective of the additional new margins submitted (parts B through F). Parts G through J: Additional deeper levels were examined. Immunohistochemical staining for CK AE1/AE3 is negative. Part G: Sections of the lymph node shows a cavitary area with focal features suggestive of a biopsy site/prior procedure related change,  in the appropriate clinical context.  There was no clip identified on gross examination.  Additional deeper levels were examined.  CK AE1/AE3 highlights the squamous metaplasia but is otherwise negative.  Clinical correlation recommended. (v4.5.0.0)  B. BREAST, RIGHT MEDIAL MARGIN, EXCISION: - Invasive ductal carcinoma and focal ductal carcinoma in situ, less than 1.0 mm away from medial margin - See oncology table  C. BREAST, RIGHT INFERIOR MARGIN, EXCISION: - Benign breast parenchyma with adenosis, fibrocystic changes including apocrine metaplasia, columnar cell change and  stromal fibrosis - Adenosis and focal usual ductal hyperplasia (UDH) with rare associated microcalcifications - Biopsy site changes - Negative for malignancy - See oncology table  D. BREAST, RIGHT LATERAL MARGIN, EXCISION: - Benign breast parenchyma with adenosis, fibrocystic changes including stromal fibrosis and columnar cell change - Negative for malignancy - See oncology table  E. BREAST, RIGHT SUPERIOR MARGIN, EXCISION: - Benign breast parenchyma with adenosis and fibrocystic changes - Negative for malignancy - See oncology table  F. BREAST, RIGHT ANTERIOR MARGIN, EXCISION: - Benign adipose tissue and focal breast parenchyma - Negative for malignancy - See oncology table  G. LYMPH NODE, RIGHT AXILLARY #1, SENTINEL, EXCISION: - One lymph node with giant cell reaction, squamous metaplasia, and focal keratinous debris - Negative for metastatic carcinoma (0/1) - See oncology table and comment  H. LYMPH NODE, RIGHT AXILLARY #2, SENTINEL, EXCISION: - One lymph node, negative for metastatic carcinoma (0/1) - See oncology table  I. LYMPH NODE, RIGHT AXILLARY #3, SENTINEL, EXCISION: - One lymph node, negative for metastatic carcinoma (0/1) - See oncology table  J. LYMPH NODE, RIGHT AXILLARY #4, SENTINEL, EXCISION: - One lymph node, negative for metastatic carcinoma (0/1) - See oncology table  K. LYMPH NODE, RIGHT AXILLARY #5, SENTINEL, EXCISION: - One lymph node, negative for metastatic carcinoma (0/1) - See oncology table  Receptor Status: ER(90%), PR (10%), Her2-neu (neg), Ki-67(1%)  Did patient present with symptoms (if so, please note symptoms) or was this found on screening mammography?: screening mammogram  Past/Anticipated interventions by surgeon, if any: 08-14-22 Right Breast Radioactive seed localized lumpectomy and sentinel lymph node biopsy   Indications: This patient presents with history of right breast cancer, cT1cN0 grade 1 invasive ductal carcinoma,  upper outer quadrant, ER+/PR+/Her2-, Ki 67 1%  Pre-operative Diagnosis: right breast cancer   Post-operative Diagnosis: Same   Surgeon: Almond Lint   Past/Anticipated interventions by medical oncology, if any:  Nicole Moulds, MD 08-29-22 Oncology History  Malignant neoplasm of upper-outer quadrant of right breast in female, estrogen receptor positive (HCC)  06/17/2022 Mammogram    Diagnostic mammogram and presented for evaluation of left breast abscess.  Bilateral diagnostic mammogram showed highly suspicious mass in the right breast at 11:00 corresponding to the area of distortion measuring 2.1 cm.  There is an additional 9 mm mass in the right breast at 10:00 3 cm from the nipple which is 1.5 cm away from the larger mass.  There is 1 prominent indeterminate right axillary lymph node.  There is a 3.8 cm abscess in the retroareolar left breast.  There is a likely benign 1.3 cm mass in the left breast at 2:00 favored to represent a reactive lymph node.  Multiple enlarged lymph nodes in the left axilla very likely to be reactive due to infection of the left breast.    07/01/2022 Initial Diagnosis    Malignant neoplasm of upper-outer quadrant of right breast in female, estrogen receptor positive (HCC)    08/14/2022 Pathology Results    Right breast  lumpectomy showed 2.4 cm grade 1 invasive ductal carcinoma, DCIS grade 2.  Margins negative for invasive carcinoma as well as DCIS.  Prognostic markers showed ER +90% PR +10% HER2 negative Ki-67 of 1% 5 lymph nodes were removed all 5 without any evidence of micro or macrometastasis or isolated tumor cells.  Final pathological staging pT2 pN0    08/28/2022 Cancer Staging    Staging form: Breast, AJCC 8th Edition - Clinical stage from 08/28/2022: Stage IB (cT2, cN0, cM0, G1, ER+, PR+, HER2-) - Signed by Nicole Moulds, MD on 08/28/2022 Histologic grading system: 3 grade system    ASSESSMENT AND PLAN:  Malignant neoplasm of upper-outer quadrant of right  breast in female, estrogen receptor positive (HCC) This is a pleasant 52 year old female patient with newly diagnosed right breast invasive ductal carcinoma, grade 1, ER/PR positive HER2 negative status postlumpectomy with final pathology showing pT2 N0 or stage IB breast cancer referred to medical oncology for additional recommendations.  She is healing well except for ongoing pain and swelling in the area of surgery.  She has a postop appointment tomorrow.  She denies any fevers or chills today.  She was very emotional and tearful about her ongoing social issues and requests some help for resources.  Physical examination, warm and tender right breast with swelling at the site of surgery, otherwise healing well. Since she has early stage breast cancer which is ER/PR positive, we have reviewed about proceeding with Oncotype Dx testing.  Given her low-grade, low proliferation index and strong ER/PR positivity, she may not need adjuvant chemo but she understands that the Oncotype DX testing will assist as in determining this.  She is willing to proceed with this.  If she is not a candidate for adjuvant chemotherapy, she will proceed with adjuvant radiation followed by antiestrogen therapy.  She is premenopausal hence we have discussed mostly about tamoxifen today.  I have reviewed the mechanism of action of tamoxifen, adverse affects of tamoxifen including but not limited to fatigue, hot flashes, vaginal discharge, risk of DVT/PE, endometrial hyperplasia, endometrial carcinoma and a benefit on bone density.  She will return to clinic after adjuvant radiation to review the role of antiestrogen therapy and initiated.  I have sent an in basket message to our social worker team to connect and to assist with any resources if possible.  I have also sent an in basket message to Dr. Donell Beers about the findings from the right breast today  Lymphedema issues, if any: none to report  Pain issues, if any:  still has mild pain  at surgical site   SAFETY ISSUES: Prior radiation? no Pacemaker/ICD? no Possible current pregnancy?no Is the patient on methotrexate? no  Current Complaints / other details:  No concerns or questions at this time going into consultation.

## 2022-08-29 NOTE — Telephone Encounter (Signed)
Called patient received no answer. Unable to leave vm mailbox full

## 2022-08-29 NOTE — Progress Notes (Signed)
[facility] CSW Progress Note  Clinical Child psychotherapist  received TC from patient  to ask about further resources. Pt has been able to schedule with counseling although could not remember agency name at this time.  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 She and her 52yo daughter live at home and there is a roommate. CSW shared information on cancer foundations that may provide assistance and e-mailed to patient. Also provided information for Everlean Alstrom, financial advocate, to learn more about the [employer].  Pt expressed understanding and will notify CSW of questions re: applications or need for assistance in completing and submitting them.    Nicole Browning E Nicole Goette, LCSW Clinical Social Worker [facility]    Patient is participating in a Managed Medicaid Plan:  Yes

## 2022-08-29 NOTE — Telephone Encounter (Signed)
Spoke with patient to follow up from new patient appt and assess navigation needs. Patient denies any questions or concerns at this time. Gave contact information and encouraged her to call should anything arise. Patient verbalized understanding.

## 2022-09-02 ENCOUNTER — Inpatient Hospital Stay: Payer: Medicaid Other

## 2022-09-02 ENCOUNTER — Inpatient Hospital Stay: Payer: Medicaid Other | Admitting: Genetic Counselor

## 2022-09-05 ENCOUNTER — Inpatient Hospital Stay: Payer: Medicaid Other | Attending: Licensed Clinical Social Worker | Admitting: Licensed Clinical Social Worker

## 2022-09-05 NOTE — Progress Notes (Signed)
[facility] CSW Progress Note  Clinical Child psychotherapist received TC from patient to follow-up on resources. CSW clarified what is needed from a utility bill to submit [employer] application. Pt is also concerned about paying her full rent this month due to also having paid a large utility bill from fan use while her air was out.  [employer] are out of funds currently and cancer foundation funds are not immediate. CSW recommended contacting [employer] for guidance on resources as well as if there are any legal avenues for assistance in this situation. Provided direct contact information to patient.  Patient is experiencing high stress and is looking forward to initiating her counseling next week to help manage the overwhelm.    Nicole Browning E Joie Reamer, LCSW Clinical Social Worker [facility]    Patient is participating in a Managed Medicaid Plan:  Yes

## 2022-09-08 ENCOUNTER — Ambulatory Visit (INDEPENDENT_AMBULATORY_CARE_PROVIDER_SITE_OTHER): Payer: Medicaid Other | Admitting: Primary Care

## 2022-09-09 ENCOUNTER — Ambulatory Visit: Payer: Medicaid Other | Admitting: Rehabilitation

## 2022-09-10 ENCOUNTER — Telehealth: Payer: Self-pay

## 2022-09-10 ENCOUNTER — Encounter: Payer: Self-pay | Admitting: Radiation Oncology

## 2022-09-10 ENCOUNTER — Encounter: Payer: Self-pay | Admitting: Licensed Clinical Social Worker

## 2022-09-10 ENCOUNTER — Inpatient Hospital Stay: Payer: Medicaid Other | Admitting: Licensed Clinical Social Worker

## 2022-09-10 NOTE — Telephone Encounter (Signed)
Rn called pt for meaningful use and nurse evaluation information. Note completed and routed to Dr. Roselind Messier.
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
## 2022-09-10 NOTE — Progress Notes (Signed)
[facility] CSW Progress Note  Clinical Child psychotherapist was contacted by patient with request to speak with [employer] (rental agency) regarding delay in rent payment and that pt is working on funding and that she has a cancer diagnosis and is undergoing treatment.  CSW contacted rental agency and shared the above information per pt request. Nicole Browning at [place] shared that eviction process had already been underway and it is now the court's decision- awaiting court date.  CSW called pt back and notified of the above. Confirmed that pt is connected with [employer] for eviction mediation assistance as well as help with housing search.   Nicole Cavalieri E Tamer Baughman, LCSW Clinical Social Worker [facility]    Patient is participating in a Managed Medicaid Plan:  Yes

## 2022-09-10 NOTE — Progress Notes (Signed)
[facility] CSW Progress Note  Clinical Child psychotherapist received bill from pt to include in [employer]. Submitted application for [employer] with supporting documents today.    Nicole Browning E Parsa Rickett, LCSW Clinical Social Worker [facility]    Patient is participating in a Managed Medicaid Plan:  Yes

## 2022-09-11 ENCOUNTER — Other Ambulatory Visit: Payer: Self-pay

## 2022-09-11 ENCOUNTER — Ambulatory Visit
Admission: RE | Admit: 2022-09-11 | Discharge: 2022-09-11 | Disposition: A | Discharge status: Home / Self Care | Payer: Medicaid Other | Source: Ambulatory Visit | Attending: Radiation Oncology | Admitting: Radiation Oncology

## 2022-09-11 ENCOUNTER — Institutional Professional Consult (permissible substitution): Payer: Medicaid Other | Admitting: Radiation Oncology

## 2022-09-11 ENCOUNTER — Ambulatory Visit: Payer: Medicaid Other | Admitting: Radiation Oncology

## 2022-09-11 VITALS — BP 111/81 | HR 88 | Temp 98.5°F | Resp 19 | Ht 66.0 in | Wt 121.2 lb

## 2022-09-11 DIAGNOSIS — N6082 Other benign mammary dysplasias of left breast: Secondary | ICD-10-CM | POA: Insufficient documentation

## 2022-09-11 DIAGNOSIS — D649 Anemia, unspecified: Secondary | ICD-10-CM | POA: Diagnosis not present

## 2022-09-11 DIAGNOSIS — K589 Irritable bowel syndrome without diarrhea: Secondary | ICD-10-CM | POA: Insufficient documentation

## 2022-09-11 DIAGNOSIS — E46 Unspecified protein-calorie malnutrition: Secondary | ICD-10-CM | POA: Insufficient documentation

## 2022-09-11 DIAGNOSIS — K219 Gastro-esophageal reflux disease without esophagitis: Secondary | ICD-10-CM | POA: Diagnosis not present

## 2022-09-11 DIAGNOSIS — F17210 Nicotine dependence, cigarettes, uncomplicated: Secondary | ICD-10-CM | POA: Diagnosis not present

## 2022-09-11 DIAGNOSIS — Z170 Estrogen receptor positive status [ER+]: Secondary | ICD-10-CM | POA: Insufficient documentation

## 2022-09-11 DIAGNOSIS — Z79899 Other long term (current) drug therapy: Secondary | ICD-10-CM | POA: Diagnosis not present

## 2022-09-11 DIAGNOSIS — Z806 Family history of leukemia: Secondary | ICD-10-CM | POA: Diagnosis not present

## 2022-09-11 DIAGNOSIS — C50411 Malignant neoplasm of upper-outer quadrant of right female breast: Secondary | ICD-10-CM

## 2022-09-11 DIAGNOSIS — D0511 Intraductal carcinoma in situ of right breast: Secondary | ICD-10-CM

## 2022-09-15 ENCOUNTER — Ambulatory Visit: Payer: Medicaid Other | Admitting: Rehabilitation

## 2022-09-17 ENCOUNTER — Encounter (HOSPITAL_COMMUNITY): Payer: Self-pay

## 2022-09-17 ENCOUNTER — Telehealth: Payer: Self-pay | Admitting: *Deleted

## 2022-09-17 NOTE — Telephone Encounter (Signed)
Pt asking about her test results and when she needs to start RT. Please advise.

## 2022-09-17 NOTE — Telephone Encounter (Signed)
Notified that Oncotype at 19, so no chemo according to Dr Al Pimple. Dr Al Pimple also sent a message to Dr Roselind Messier in RT to follow up

## 2022-09-18 ENCOUNTER — Telehealth: Payer: Self-pay | Admitting: *Deleted

## 2022-09-18 ENCOUNTER — Encounter: Payer: Self-pay | Admitting: *Deleted

## 2022-09-18 NOTE — Telephone Encounter (Signed)
Received oncotype score of 19. Physician team made aware

## 2022-09-22 ENCOUNTER — Encounter: Payer: Self-pay | Admitting: Licensed Clinical Social Worker

## 2022-09-22 NOTE — Progress Notes (Signed)
[facility] CSW Progress Note  Clinical Child psychotherapist received e-mail notification from [employer] that pt was approved for assistance with her water bill. CSW shared this information with pt and proviedd contact information for [employer] coordinator should pt have questions regarding the bill payment.    Brenner Visconti E Hong Timm, LCSW Clinical Social Worker [facility]    Patient is participating in a Managed Medicaid Plan:  Yes

## 2022-09-24 ENCOUNTER — Ambulatory Visit: Admission: RE | Admit: 2022-09-24 | Payer: Medicaid Other | Source: Ambulatory Visit | Admitting: Radiation Oncology

## 2022-09-25 ENCOUNTER — Ambulatory Visit
Admission: RE | Admit: 2022-09-25 | Discharge: 2022-09-25 | Disposition: A | Discharge status: Home / Self Care | Payer: Medicaid Other | Source: Ambulatory Visit | Attending: Radiation Oncology | Admitting: Radiation Oncology

## 2022-09-25 DIAGNOSIS — Z170 Estrogen receptor positive status [ER+]: Secondary | ICD-10-CM

## 2022-09-25 DIAGNOSIS — D0511 Intraductal carcinoma in situ of right breast: Secondary | ICD-10-CM | POA: Insufficient documentation

## 2022-09-26 ENCOUNTER — Inpatient Hospital Stay: Payer: Medicaid Other | Admitting: Licensed Clinical Social Worker

## 2022-09-26 NOTE — Progress Notes (Signed)
[facility] CSW Progress Note  Clinical Child psychotherapist communicated with pt via e-mail as pt stated her phone is off. Pt inquired about any further assistance programs as she is working on moving (has been approved for [employer] already). CSW reminded pt of [employer] and gave information on Christene Lye, although both take time for approval. CSW also recommended pt contact her insurance [facility]) as they sometimes have a small housing allowance available for members to assist with rental or utility deposits.  Letter sent to pt confirming dx and treatment plan that she can use with her applications.    Aliese Brannum E Colin Ellers, LCSW Clinical Social Worker [facility]    Patient is participating in a Managed Medicaid Plan:  Yes

## 2022-09-29 ENCOUNTER — Telehealth: Payer: Self-pay | Admitting: Genetic Counselor

## 2022-09-29 NOTE — Telephone Encounter (Signed)
Called twice; left a message in regards to appointment being cancelled due to Genetics Counselor being unavailable; left callback number in regards to reschedule appointment for a later date.

## 2022-09-30 DIAGNOSIS — D0511 Intraductal carcinoma in situ of right breast: Secondary | ICD-10-CM | POA: Diagnosis not present

## 2022-10-01 ENCOUNTER — Encounter: Payer: Self-pay | Admitting: *Deleted

## 2022-10-01 DIAGNOSIS — Z170 Estrogen receptor positive status [ER+]: Secondary | ICD-10-CM

## 2022-10-02 ENCOUNTER — Inpatient Hospital Stay: Payer: Medicaid Other | Attending: Licensed Clinical Social Worker | Admitting: Licensed Clinical Social Worker

## 2022-10-02 NOTE — Progress Notes (Signed)
[facility] CSW Progress Note  Clinical Child psychotherapist  received TC from patient  regarding psychosocial stressors. Pt continues to deal with issues with utilities and housing. She asked about short-term place to stay for the next month but does not want to explore shelters. Unfortunately, CSW is unaware of other alternatives. CSW did share information on [employer] (hotel discounts for cancer pts). Pt is still connected with [employer] housing program but local resources are very limited.  Pt did receive assistance with water bill from [employer] and CSW re-shared information/application for [employer].   Pt is very stressed and feeling depressed due to above stressors plus cancer treatment. Her therapist is going to be out for her own surgery. Pt agreed to referral to [facility] for sessions while awaiting her therapist's return. Referral made today.   Camella Seim E Zyden Suman, LCSW Clinical Social Worker [facility]    Patient is participating in a Managed Medicaid Plan:  Yes

## 2022-10-07 ENCOUNTER — Encounter: Payer: Medicaid Other | Admitting: Genetic Counselor

## 2022-10-07 ENCOUNTER — Other Ambulatory Visit: Payer: Medicaid Other

## 2022-10-07 ENCOUNTER — Inpatient Hospital Stay: Payer: Medicaid Other | Admitting: *Deleted

## 2022-10-07 ENCOUNTER — Ambulatory Visit: Payer: Medicaid Other

## 2022-10-07 ENCOUNTER — Ambulatory Visit: Payer: Medicaid Other | Attending: General Surgery | Admitting: Rehabilitation

## 2022-10-07 ENCOUNTER — Ambulatory Visit: Payer: Medicaid Other | Admitting: Radiation Oncology

## 2022-10-07 ENCOUNTER — Encounter: Payer: Self-pay | Admitting: Radiation Oncology

## 2022-10-07 DIAGNOSIS — R293 Abnormal posture: Secondary | ICD-10-CM | POA: Insufficient documentation

## 2022-10-07 DIAGNOSIS — Z170 Estrogen receptor positive status [ER+]: Secondary | ICD-10-CM | POA: Insufficient documentation

## 2022-10-07 DIAGNOSIS — C50411 Malignant neoplasm of upper-outer quadrant of right female breast: Secondary | ICD-10-CM | POA: Insufficient documentation

## 2022-10-07 NOTE — Progress Notes (Signed)
Received voicemail from patient regarding financial resources.   Called patient and left voicemail with my contact name and number to return my call.

## 2022-10-07 NOTE — Progress Notes (Signed)
[facility] CSW Progress Note  Clinical Child psychotherapist contacted patient by phone to follow up on voicemail message. Patient expressed concerns with utility payments and need for resources. CSW provided patient via email (confirmed email with patient), CSW sent over referral for Everlean Alstrom for [employer]    Marguerita Merles, [place] Clinical Social Worker [facility]

## 2022-10-08 ENCOUNTER — Other Ambulatory Visit: Payer: Self-pay

## 2022-10-08 ENCOUNTER — Ambulatory Visit
Admission: RE | Admit: 2022-10-08 | Discharge: 2022-10-08 | Disposition: A | Discharge status: Home / Self Care | Payer: Medicaid Other | Source: Ambulatory Visit | Attending: Radiation Oncology | Admitting: Radiation Oncology

## 2022-10-08 DIAGNOSIS — N611 Abscess of the breast and nipple: Secondary | ICD-10-CM | POA: Diagnosis present

## 2022-10-08 DIAGNOSIS — C50411 Malignant neoplasm of upper-outer quadrant of right female breast: Secondary | ICD-10-CM | POA: Diagnosis present

## 2022-10-08 DIAGNOSIS — Z170 Estrogen receptor positive status [ER+]: Secondary | ICD-10-CM | POA: Insufficient documentation

## 2022-10-08 LAB — RAD ONC ARIA SESSION SUMMARY
Course Elapsed Days: 0
Plan Fractions Treated to Date: 1
Plan Prescribed Dose Per Fraction: 2.67 Gy
Plan Total Fractions Prescribed: 15
Plan Total Prescribed Dose: 40.05 Gy
Reference Point Dosage Given to Date: 2.67 Gy
Reference Point Session Dosage Given: 2.67 Gy
Session Number: 1

## 2022-10-09 ENCOUNTER — Encounter: Payer: Self-pay | Admitting: Hematology and Oncology

## 2022-10-09 ENCOUNTER — Ambulatory Visit: Payer: Medicaid Other

## 2022-10-09 NOTE — Progress Notes (Signed)
Patient completed grant paperwork at registration. She received a gas card today from her grant. She has a copy of the approval letter and expense sheet in green folder,  She has my card to contact me to discuss expenses in detail and for any additional financial questions or concerns.

## 2022-10-09 NOTE — Progress Notes (Signed)
Called and left 2nd message regarding [employer]. Left my contact name and number to return my call.

## 2022-10-09 NOTE — Progress Notes (Signed)
Called patient on other number listed and was able to speak with her.  Introduced myself as Dance movement psychotherapist and to offer available resources. Discussed one-time $1000 [employer] grant to assist with personal expenses while going through treatment. Patient will be approved for grant. Advised to sign paperwork today at registration after she completes Radiation. She will call me at earliest convenience to discuss grant expense sheet in detail. Briefly explained gas cards.  She has my card to do so and for any additional financial questions or concerns.

## 2022-10-10 ENCOUNTER — Ambulatory Visit: Payer: Medicaid Other

## 2022-10-10 ENCOUNTER — Telehealth: Payer: Self-pay | Admitting: Radiation Oncology

## 2022-10-10 NOTE — Telephone Encounter (Signed)
Patient called stating she will be going out of town today, and will not be able to make her 8/9 TX. LVM for Rad Onc therapist.

## 2022-10-13 ENCOUNTER — Other Ambulatory Visit: Payer: Self-pay

## 2022-10-13 ENCOUNTER — Ambulatory Visit
Admission: RE | Admit: 2022-10-13 | Discharge: 2022-10-13 | Disposition: A | Discharge status: Home / Self Care | Payer: Medicaid Other | Source: Ambulatory Visit | Attending: Radiation Oncology | Admitting: Radiation Oncology

## 2022-10-13 ENCOUNTER — Inpatient Hospital Stay: Payer: Medicaid Other

## 2022-10-13 DIAGNOSIS — C50411 Malignant neoplasm of upper-outer quadrant of right female breast: Secondary | ICD-10-CM | POA: Diagnosis not present

## 2022-10-13 LAB — RAD ONC ARIA SESSION SUMMARY
Course Elapsed Days: 5
Plan Fractions Treated to Date: 2
Plan Prescribed Dose Per Fraction: 2.67 Gy
Plan Total Fractions Prescribed: 15
Plan Total Prescribed Dose: 40.05 Gy
Reference Point Dosage Given to Date: 5.34 Gy
Reference Point Session Dosage Given: 2.67 Gy
Session Number: 2

## 2022-10-13 NOTE — Progress Notes (Signed)
[facility] CSW Progress Note  Clinical Child psychotherapist contacted patient by phone to per patient request to [employer]. Patient stated reason for call is need for assistance with utility bill.   CSW inquired if [employer] grant paid portion of utility.  CSW Inquired about Adela Ports, Patient stated that she applied for the funds, and was not sure if she got approved.   CSW confirmed with PT Financial Resource Specialist Everlean Alstrom that patient was approved for grant. However, takes 2 weeks to process payment from receiving bill. CSW will attempt to find resources to pay balance.  CSW  will contact [employer] to see if there is any remaining balance or tasks for patient to complete.   Marguerita Merles, LCSWA Clinical Social Worker [facility]

## 2022-10-14 ENCOUNTER — Telehealth: Payer: Self-pay | Admitting: *Deleted

## 2022-10-14 ENCOUNTER — Ambulatory Visit: Payer: Medicaid Other

## 2022-10-14 ENCOUNTER — Ambulatory Visit
Admission: RE | Admit: 2022-10-14 | Discharge: 2022-10-14 | Disposition: A | Discharge status: Home / Self Care | Payer: Medicaid Other | Source: Ambulatory Visit | Attending: Radiation Oncology | Admitting: Radiation Oncology

## 2022-10-14 ENCOUNTER — Other Ambulatory Visit: Payer: Medicaid Other | Admitting: *Deleted

## 2022-10-14 ENCOUNTER — Other Ambulatory Visit: Payer: Self-pay

## 2022-10-14 DIAGNOSIS — C50411 Malignant neoplasm of upper-outer quadrant of right female breast: Secondary | ICD-10-CM | POA: Diagnosis not present

## 2022-10-14 DIAGNOSIS — Z170 Estrogen receptor positive status [ER+]: Secondary | ICD-10-CM

## 2022-10-14 DIAGNOSIS — N611 Abscess of the breast and nipple: Secondary | ICD-10-CM

## 2022-10-14 LAB — RAD ONC ARIA SESSION SUMMARY
Course Elapsed Days: 6
Plan Fractions Treated to Date: 3
Plan Prescribed Dose Per Fraction: 2.67 Gy
Plan Total Fractions Prescribed: 15
Plan Total Prescribed Dose: 40.05 Gy
Reference Point Dosage Given to Date: 8.01 Gy
Reference Point Session Dosage Given: 2.67 Gy
Session Number: 3

## 2022-10-14 MED ORDER — RADIAPLEXRX EX GEL: Freq: Once | CUTANEOUS | Status: AC

## 2022-10-14 MED ORDER — ALRA NON-METALLIC DEODORANT (RAD-ONC)
1.0000 | Freq: Once | TOPICAL | Status: AC
  Administered 2022-10-14: 1 via TOPICAL

## 2022-10-14 NOTE — Telephone Encounter (Signed)
Called patient to inform of appt. with Dr. Donell Beers on 10-24-22- arrival time- 10:15 am,m lvm for a return call

## 2022-10-15 ENCOUNTER — Other Ambulatory Visit: Payer: Self-pay

## 2022-10-15 ENCOUNTER — Inpatient Hospital Stay: Payer: Medicaid Other | Admitting: *Deleted

## 2022-10-15 ENCOUNTER — Ambulatory Visit
Admission: RE | Admit: 2022-10-15 | Discharge: 2022-10-15 | Disposition: A | Discharge status: Home / Self Care | Payer: Medicaid Other | Source: Ambulatory Visit | Attending: Radiation Oncology | Admitting: Radiation Oncology

## 2022-10-15 DIAGNOSIS — C50411 Malignant neoplasm of upper-outer quadrant of right female breast: Secondary | ICD-10-CM | POA: Diagnosis not present

## 2022-10-15 LAB — RAD ONC ARIA SESSION SUMMARY
Course Elapsed Days: 7
Plan Fractions Treated to Date: 4
Plan Prescribed Dose Per Fraction: 2.67 Gy
Plan Total Fractions Prescribed: 15
Plan Total Prescribed Dose: 40.05 Gy
Reference Point Dosage Given to Date: 10.68 Gy
Reference Point Session Dosage Given: 2.67 Gy
Session Number: 4

## 2022-10-15 NOTE — Progress Notes (Signed)
[facility] CSW Progress Note  Visual merchandiser met with patient to discuss utilizing [employer] grant to pay [employer]. CSW/[employer] crew to patient and informed patient we are unable to assist with current bill.  Patient shared multiple stressors patient and daughter are experiencing and CSW provided validation and brief emotional support. CSW Glade Lloyd will continue to follow patient to navigate community resources and connection with mental health services.    Polo Riley, LCSW Clinical Social Worker [facility]    Patient is participating in a Managed Medicaid Plan:  Yes

## 2022-10-16 ENCOUNTER — Ambulatory Visit (HOSPITAL_COMMUNITY): Payer: Self-pay

## 2022-10-16 ENCOUNTER — Ambulatory Visit: Payer: Medicaid Other

## 2022-10-16 ENCOUNTER — Telehealth: Payer: Self-pay

## 2022-10-16 NOTE — Telephone Encounter (Signed)
Patient called in to reports increased pain and swelling to left breast. Patient states she is going to the urgent care to have area drained today. Per Dr. Roselind Messier, patient to keep appointment with surgeon on 10/24/22 to follow up with area to left breast. Patient voiced understanding.

## 2022-10-17 ENCOUNTER — Ambulatory Visit: Payer: Self-pay

## 2022-10-17 ENCOUNTER — Encounter (HOSPITAL_COMMUNITY): Payer: Self-pay

## 2022-10-17 ENCOUNTER — Ambulatory Visit: Payer: Medicaid Other

## 2022-10-17 ENCOUNTER — Inpatient Hospital Stay: Payer: Medicaid Other

## 2022-10-17 ENCOUNTER — Ambulatory Visit (HOSPITAL_COMMUNITY)
Admission: EM | Admit: 2022-10-17 | Discharge: 2022-10-17 | Disposition: A | Discharge status: Home / Self Care | Payer: Medicaid Other | Attending: Internal Medicine | Admitting: Internal Medicine

## 2022-10-17 DIAGNOSIS — N611 Abscess of the breast and nipple: Secondary | ICD-10-CM

## 2022-10-17 DIAGNOSIS — C50411 Malignant neoplasm of upper-outer quadrant of right female breast: Secondary | ICD-10-CM | POA: Diagnosis not present

## 2022-10-17 MED ORDER — ACETAMINOPHEN-CODEINE 300-30 MG PO TABS: 1.0000 | ORAL_TABLET | Freq: Four times a day (QID) | ORAL | 0 refills | Status: DC | PRN

## 2022-10-17 MED ORDER — POVIDONE-IODINE 10 % EX SOLN
CUTANEOUS | Status: AC
  Filled 2022-10-17: qty 118

## 2022-10-17 MED ORDER — CIPROFLOXACIN HCL 500 MG PO TABS: 500.0000 mg | ORAL_TABLET | Freq: Two times a day (BID) | ORAL | 0 refills | Status: DC

## 2022-10-17 MED ORDER — LIDOCAINE-EPINEPHRINE-TETRACAINE (LET) TOPICAL GEL
TOPICAL | Status: AC
  Filled 2022-10-17: qty 3

## 2022-10-17 NOTE — ED Provider Notes (Signed)
[facility]    CSN: [id number] Arrival date & time: 10/17/22  1716      History   Chief Complaint Chief Complaint  Patient presents with   Breast Problem    HPI Nicole Browning is a 52 y.o. female.   52 year old female who presents to urgent care with pain, swelling, redness and drainage from the left breast around the areola.  This started 3 to 4 days ago.  This is happened in the past in April of this year and it was aspirated at urgent care.  At that time she had a mammogram done which actually showed right breast cancer and she is currently undergoing radiation treatment.  She did try to contact her surgeon but was unable to get in until next week but due to the pain she needed more urgent treatment.  She denies fevers or chills.  The drainage has been whitish to yellowish.  Her pain is 10 out of 10.  She has tried to manually drain it at home but has been unable to express much in the last 24 hours.       Past Medical History:  Diagnosis Date   Anemia    Diverticulitis 12/13/2020   GERD (gastroesophageal reflux disease)    IBS (irritable bowel syndrome)    Perirectal abscess    Protein-calorie malnutrition (HCC) 02/29/2016   Tobacco use    UC (ulcerative colitis) [facility])     Patient Active Problem List   Diagnosis Date Noted   Malignant neoplasm of upper-outer quadrant of right breast in female, estrogen receptor positive (HCC) 07/01/2022   Diverticulitis 12/13/2020   Smoking 11/22/2020   Liver abscess 10/17/2020   Medication monitoring encounter 10/17/2020   Streptococcal bacteremia    Intra-abdominal abscess (HCC) 10/03/2020   AKI (acute kidney injury) (HCC) 10/03/2020   Protein-calorie malnutrition, severe 09/28/2020   IDA (iron deficiency anemia) 07/24/2020   IBD (inflammatory bowel disease) 07/24/2020   Protein-calorie malnutrition (HCC) 02/29/2016   Elevated hemoglobin A1c 02/27/2016   GERD (gastroesophageal reflux disease)    Tobacco use     Anemia of chronic disease    Prediabetes    Perirectal abscess 02/24/2016    Past Surgical History:  Procedure Laterality Date   BIOPSY  09/29/2020   Procedure: BIOPSY;  Surgeon: Lynann Bologna, MD;  Location: [facility] ENDOSCOPY;  Service: Endoscopy;;   BREAST BIOPSY Right 06/24/2022   [place] RT BREAST BX W LOC DEV 1ST LESION IMG BX SPEC US GUIDE 06/24/2022 [facility]   BREAST BIOPSY Right 06/24/2022   [place] RT BREAST BX W LOC DEV EA ADD LESION IMG BX SPEC US GUIDE 06/24/2022 [facility]   BREAST BIOPSY  08/13/2022   MM RT RADIOACTIVE SEED LOC MAMMO GUIDE 08/13/2022 [facility]   BREAST LUMPECTOMY WITH RADIOACTIVE SEED AND SENTINEL LYMPH NODE BIOPSY Right 08/14/2022   Procedure: RIGHT BREAST LUMPECTOMY WITH RADIOACTIVE SEED AND SENTINEL LYMPH NODE BIOPSY;  Surgeon: Almond Lint, MD;  Location: [facility];  Service: General;  Laterality: Right;   ESOPHAGOGASTRODUODENOSCOPY (EGD) WITH PROPOFOL N/A 09/29/2020   Procedure: ESOPHAGOGASTRODUODENOSCOPY (EGD) WITH PROPOFOL;  Surgeon: Lynann Bologna, MD;  Location: [facility] ENDOSCOPY;  Service: Endoscopy;  Laterality: N/A;   IR RADIOLOGIST EVAL & MGMT  10/17/2020   IRRIGATION AND DEBRIDEMENT ABSCESS N/A 02/24/2016   Procedure: IRRIGATION AND DEBRIDEMENT ABSCESS;  Surgeon: Almond Lint, MD;  Location: [facility];  Service: General;  Laterality: N/A;    OB History   No obstetric history on file.  Home Medications    Prior to Admission medications   Medication Sig Start Date End Date Taking? Authorizing Provider  bismuth subsalicylate (PEPTO BISMOL) 262 MG/15ML suspension Take 30 mLs by mouth every 6 (six) hours as needed for indigestion.    [provider]  cephALEXin (KEFLEX) 500 MG capsule Take 1 capsule (500 mg total) by mouth 4 (four) times daily. 06/12/22   Raspet, Noberto Retort, PA-C  cetirizine (ZYRTEC ALLERGY) 10 MG tablet Take 1 tablet (10 mg total) by mouth at bedtime. 02/28/22 08/27/22  Theadora Rama Scales, PA-C   fluticasone (FLONASE) 50 MCG/ACT nasal spray Place 1 spray into both nostrils daily. Begin by using 2 sprays in each nare daily for 3 to 5 days, then decrease to 1 spray in each nare daily. 02/28/22   Theadora Rama Scales, PA-C  HYDROcodone-acetaminophen (NORCO/VICODIN) 5-325 MG tablet Take 1 tablet by mouth every 6 (six) hours as needed for moderate pain. 08/14/22   Almond Lint, MD  ibuprofen (ADVIL) 400 MG tablet Take 1 tablet (400 mg total) by mouth every 8 (eight) hours as needed for up to 30 doses. 02/28/22   Theadora Rama Scales, PA-C  mesalamine (LIALDA) 1.2 g EC tablet Take 1 tablet (1.2 g total) by mouth in the morning and at bedtime. 07/11/20   Arnaldo Natal, NP  Multiple Vitamins-Minerals (WOMENS MULTIVITAMIN PO) Take 1 tablet by mouth daily.    [provider]  pantoprazole (PROTONIX) 40 MG tablet Take 1 tablet (40 mg total) by mouth daily. 10/06/20   Shon Hale, MD  POTASSIUM PO Take 1 tablet by mouth daily.    [provider]  Vitamin D, Ergocalciferol, (DRISDOL) 1.25 MG (50000 UNIT) CAPS capsule Take 1 capsule (50,000 Units total) by mouth every 7 (seven) days. TAKE ONE CAPSULE BY MOUTH ONCE WEEKLY 07/22/20   Arnaldo Natal, NP  VITAMIN E PO Take 1 tablet by mouth daily.    [provider]    Family History Family History  Problem Relation Age of Onset   Diabetes Mother    COPD Father    Leukemia Brother    Colon cancer Neg Hx    Colon polyps Neg Hx    Esophageal cancer Neg Hx    Rectal cancer Neg Hx    Stomach cancer Neg Hx     Social History Social History   Tobacco Use   Smoking status: Every Day    Current packs/day: 0.33    Average packs/day: 0.3 packs/day for 28.0 years (9.2 ttl pk-yrs)    Types: Cigarettes   Smokeless tobacco: Never  Vaping Use   Vaping status: Never Used  Substance Use Topics   Alcohol use: Yes    Alcohol/week: 2.0 standard drinks of alcohol    Types: 2 Glasses of wine per week    Drug use: No     Allergies   Bactrim [sulfamethoxazole-trimethoprim], Percocet [oxycodone-acetaminophen], and Valium [diazepam]   Review of Systems Review of Systems  Constitutional:  Negative for chills and fever.  HENT:  Negative for ear pain and sore throat.   Respiratory:  Negative for cough and shortness of breath.   Cardiovascular:  Negative for chest pain and palpitations.  Gastrointestinal:  Negative for abdominal pain and vomiting.  Musculoskeletal:  Negative for arthralgias and back pain.  Skin:  Positive for color change.       Left breast swelling, pain and drainage  Neurological:  Negative for syncope.  All other systems reviewed and are negative.  Physical Exam Triage Vital Signs ED Triage Vitals  Encounter Vitals Group     BP 10/17/22 1725 117/80     Systolic BP Percentile --      Diastolic BP Percentile --      Pulse Rate 10/17/22 1725 99     Resp 10/17/22 1725 16     Temp 10/17/22 1725 97.9 F (36.6 C)     Temp Source 10/17/22 1725 Oral     SpO2 10/17/22 1725 97 %     Weight --      Height --      Head Circumference --      Peak Flow --      Pain Score 10/17/22 1726 10     Pain Loc --      Pain Education --      Exclude from Growth Chart --    No data found.  Updated Vital Signs BP 117/80 (BP Location: Left Arm)   Pulse 99   Temp 97.9 F (36.6 C) (Oral)   Resp 16   SpO2 97%   Visual Acuity Right Eye Distance:   Left Eye Distance:   Bilateral Distance:    Right Eye Near:   Left Eye Near:    Bilateral Near:     Physical Exam Vitals and nursing note reviewed.  Constitutional:      General: She is not in acute distress.    Appearance: She is well-developed.  HENT:     Head: Normocephalic and atraumatic.  Eyes:     Conjunctiva/sclera: Conjunctivae normal.  Cardiovascular:     Rate and Rhythm: Normal rate and regular rhythm.     Heart sounds: No murmur heard. Pulmonary:     Effort: Pulmonary effort is normal. No respiratory  distress.     Breath sounds: Normal breath sounds.  Abdominal:     Palpations: Abdomen is soft.     Tenderness: There is no abdominal tenderness.  Musculoskeletal:        General: No swelling.     Cervical back: Neck supple.  Skin:    General: Skin is warm and dry.     Capillary Refill: Capillary refill takes less than 2 seconds.       Neurological:     Mental Status: She is alert.  Psychiatric:        Mood and Affect: Mood normal.      UC Treatments / Results  Labs (all labs ordered are listed, but only abnormal results are displayed) Labs Reviewed - No data to display  EKG   Radiology No results found.  Procedures Incision and Drainage  Date/Time: 10/17/2022 6:38 PM  Performed by: Landis Martins, PA-C Authorized by: Landis Martins, PA-C   Consent:    Consent obtained:  Verbal   Consent given by:  Patient   Risks discussed:  Bleeding, incomplete drainage, pain and damage to other organs   Alternatives discussed:  No treatment Universal protocol:    Procedure explained and questions answered to patient or proxy's satisfaction: yes     Relevant documents present and verified: yes     Test results available : yes     Imaging studies available: yes     Required blood products, implants, devices, and special equipment available: yes     Site/side marked: yes     Immediately prior to procedure, a time out was called: yes     Patient identity confirmed:  Verbally with patient Location:    Type:  Abscess Pre-procedure details:    Skin preparation:  Betadine Anesthesia:    Anesthesia method:  Topical application   Topical anesthetic:  LET Procedure type:    Complexity:  Complex Procedure details:    Ultrasound guidance: no     Needle aspiration: yes     Needle size:  18 G   Incision depth:  Subcutaneous   Wound management:  Extensive cleaning   Drainage:  Purulent   Drainage amount:  Copious   Packing materials:  None Post-procedure details:     Procedure completion:  Tolerated well, no immediate complications Comments:     35cc's of purulent drainage expressed and culture sent.   (including critical care time)  Medications Ordered in UC Medications - No data to display  Initial Impression / Assessment and Plan / UC Course  I have reviewed the triage vital signs and the nursing notes.  Pertinent labs & imaging results that were available during my care of the patient were reviewed by me and considered in my medical decision making (see chart for details).     Left breast retro-areola abscess: Needle aspiration performed today with 18-gauge needle with 35 cc of purulent drainage return.  Culture obtained.  Will start on Cipro 500 mg twice daily x 14 days and advised that the patient needs to keep follow-up with surgery next week.  Tylenol 3 sent in #10 to help with pain.  Also advised patient that she can use ice to the area.  If the area worsens or fails to improve she may return to urgent care. Final Clinical Impressions(s) / UC Diagnoses   Final diagnoses:  None   Discharge Instructions   None    ED Prescriptions   None    PDMP not reviewed this encounter.   Landis Martins, [place] 10/17/22 1847

## 2022-10-17 NOTE — Discharge Instructions (Addendum)
Keep a dry dressing over the area, may use ice on the area but avoid directly on skin. Cipro twice daily until follow up with surgeon. Tylenol #3 q6 hours PRN pain. Return to urgent care if area worsens or fails to improve. Keep follow up with surgeon

## 2022-10-17 NOTE — ED Triage Notes (Addendum)
Pt states left breast is leaking white fluid and is painful.  Pt states she is getting radiation treatment on her right breast for cancer. States she hasn't taken anything at home for the pain.

## 2022-10-17 NOTE — Progress Notes (Signed)
[facility] CSW Progress Note  Clinical Child psychotherapist contacted patient by phone to inform her that she must give written authorization to the staff at [employer] for them to speak with the CSW team. CSW informed patient of information that will be requested of leasing team, patient stated she would write the email. CSW team did not hear back from [facility] team, will follow up again next week,.    Marguerita Merles, LCSWA Clinical Social Worker [facility]

## 2022-10-17 NOTE — Progress Notes (Signed)
[facility] CSW Progress Note  Patient called Clinical Social Worker to follow up on financial assistance regarding utility bill. Patient discussed with CSW the requirements for assistance. Patient gave CSW verbal authorization to call leasing agency to obtain information needed. Patient stated that she is okay with CSW disclosing CSW is an employee of [employer].  CSW will consult with supervisor for next steps.   Marguerita Merles, LCSWA Clinical Social Worker [facility]

## 2022-10-18 ENCOUNTER — Telehealth (HOSPITAL_COMMUNITY): Payer: Self-pay | Admitting: Physician Assistant

## 2022-10-18 NOTE — Telephone Encounter (Signed)
Order placed for aerobic culture.

## 2022-10-18 NOTE — Telephone Encounter (Signed)
Microbiology staff informed me that breast sample for this patient, was placed in a container that would only allow for aerobic testing ,and not aerobic/ anaerobic as requested.  New order will be placed for Aerobic only

## 2022-10-20 ENCOUNTER — Ambulatory Visit
Admission: RE | Admit: 2022-10-20 | Discharge: 2022-10-20 | Disposition: A | Discharge status: Home / Self Care | Payer: Medicaid Other | Source: Ambulatory Visit | Attending: Radiation Oncology | Admitting: Radiation Oncology

## 2022-10-20 ENCOUNTER — Other Ambulatory Visit: Payer: Self-pay

## 2022-10-20 DIAGNOSIS — C50411 Malignant neoplasm of upper-outer quadrant of right female breast: Secondary | ICD-10-CM | POA: Diagnosis not present

## 2022-10-20 LAB — RAD ONC ARIA SESSION SUMMARY
Course Elapsed Days: 12
Plan Fractions Treated to Date: 5
Plan Prescribed Dose Per Fraction: 2.67 Gy
Plan Total Fractions Prescribed: 15
Plan Total Prescribed Dose: 40.05 Gy
Reference Point Dosage Given to Date: 13.35 Gy
Reference Point Session Dosage Given: 2.67 Gy
Session Number: 5

## 2022-10-21 ENCOUNTER — Ambulatory Visit: Payer: Medicaid Other

## 2022-10-21 ENCOUNTER — Ambulatory Visit: Payer: Medicaid Other | Admitting: Radiation Oncology

## 2022-10-21 ENCOUNTER — Inpatient Hospital Stay: Payer: Medicaid Other

## 2022-10-21 LAB — AEROBIC CULTURE W GRAM STAIN (SUPERFICIAL SPECIMEN)
Culture: NORMAL
Gram Stain: NONE SEEN

## 2022-10-21 NOTE — Progress Notes (Signed)
[facility] CSW Progress Note  Clinical Social Worker attempted to contact patient to follow up on previous conversation of verbal authorization to Electrical engineer. No answer, left vm with direct contact information, requesting call back. CSW team is unable to proceed with decision on assistance until information is received from leasing agency.  Marguerita Merles, LCSWA Clinical Social Worker [facility]

## 2022-10-21 NOTE — Progress Notes (Signed)
[facility] CSW Progress Note  Visual merchandiser (CSW) and Merchandiser, retail L. Somers reached out to patient's leasing agency to confirm leasing details to determine if utility assistance is possible. CSW team spoke with leasing team and unfortunately were unable to determine leasing details due to leasing team stating they did  not receiving any written or verbal authorization from patient to discuss information. CSW called patient back to relay message, patient stated she previously Company secretary agency details, but would call them to provide verbal authorization. CSW requested patient to call back once verbal authorization was given so that CSW team could proceed with verifying leasing details. Patient agreed.   Marguerita Merles, LCSWA Clinical Social Worker [facility]

## 2022-10-22 ENCOUNTER — Ambulatory Visit
Admission: RE | Admit: 2022-10-22 | Discharge: 2022-10-22 | Disposition: A | Discharge status: Home / Self Care | Payer: Medicaid Other | Source: Ambulatory Visit | Attending: Radiation Oncology | Admitting: Radiation Oncology

## 2022-10-22 ENCOUNTER — Encounter: Payer: Self-pay | Admitting: Radiology

## 2022-10-22 ENCOUNTER — Ambulatory Visit: Admission: RE | Admit: 2022-10-22 | Payer: Medicaid Other | Source: Ambulatory Visit

## 2022-10-22 ENCOUNTER — Ambulatory Visit: Payer: Medicaid Other

## 2022-10-22 ENCOUNTER — Inpatient Hospital Stay: Payer: Medicaid Other

## 2022-10-22 ENCOUNTER — Other Ambulatory Visit: Payer: Self-pay

## 2022-10-22 ENCOUNTER — Telehealth: Payer: Self-pay

## 2022-10-22 DIAGNOSIS — C50411 Malignant neoplasm of upper-outer quadrant of right female breast: Secondary | ICD-10-CM | POA: Diagnosis not present

## 2022-10-22 LAB — RAD ONC ARIA SESSION SUMMARY
Course Elapsed Days: 14
Plan Fractions Treated to Date: 6
Plan Prescribed Dose Per Fraction: 2.67 Gy
Plan Total Fractions Prescribed: 15
Plan Total Prescribed Dose: 40.05 Gy
Reference Point Dosage Given to Date: 16.02 Gy
Reference Point Session Dosage Given: 2.67 Gy
Session Number: 6

## 2022-10-22 NOTE — Telephone Encounter (Signed)
[facility] CSW Progress Note  Visual merchandiser (CSW) attempted to contact patient due to her stopping by support services. Csw attempted two phone calls and left one VM.   Marguerita Merles, LCSWA Clinical Social Worker [facility]

## 2022-10-22 NOTE — Progress Notes (Signed)
I saw Nicole Browning with Dr. Roselind Messier today for her concerns of breast drainage. She is currently being treated with radiation to her right breast and has developed an abscess on her left breast. She went to an Urgent care and was started on ciprofloxacin on 10/17/22. Today she still notes drainage from her breast. She sees surgery for a follow-up appointment tomorrow. Clear fluid appreciated from abscess upon palpation. Patient was given gauze today and encouraged to continue taking her antibiotics and to go to her surgery appointment tomorrow for follow-up. She can proceed with her remaining radiation treatments. Patient expressed understanding and is in agreement with this plan.     Joyice Faster, PA-C

## 2022-10-22 NOTE — Progress Notes (Signed)
[facility] CSW Progress Note  Clinical Child psychotherapist contacted patient by phone to discuss written authorization for CSW team to speak with leasing agency. CSW was able to speak with leasing agency and gather information necessary. CSW will follow up with Support Services supervisor L. Somers about utility assistance and follow up with patient.   Marguerita Merles, LCSWA Clinical Social Worker [facility]

## 2022-10-23 ENCOUNTER — Ambulatory Visit
Admission: RE | Admit: 2022-10-23 | Discharge: 2022-10-23 | Disposition: A | Discharge status: Home / Self Care | Payer: Medicaid Other | Source: Ambulatory Visit | Attending: Radiation Oncology | Admitting: Radiation Oncology

## 2022-10-23 ENCOUNTER — Other Ambulatory Visit: Payer: Self-pay

## 2022-10-23 DIAGNOSIS — C50411 Malignant neoplasm of upper-outer quadrant of right female breast: Secondary | ICD-10-CM | POA: Diagnosis not present

## 2022-10-23 LAB — RAD ONC ARIA SESSION SUMMARY
Course Elapsed Days: 15
Plan Fractions Treated to Date: 7
Plan Prescribed Dose Per Fraction: 2.67 Gy
Plan Total Fractions Prescribed: 15
Plan Total Prescribed Dose: 40.05 Gy
Reference Point Dosage Given to Date: 18.69 Gy
Reference Point Session Dosage Given: 2.67 Gy
Session Number: 7

## 2022-10-24 ENCOUNTER — Ambulatory Visit: Payer: Medicaid Other

## 2022-10-24 ENCOUNTER — Telehealth: Payer: Self-pay | Admitting: Radiation Oncology

## 2022-10-24 NOTE — Telephone Encounter (Signed)
8/23 @ 1:57 pm Patient called to cancel her treatment appointment for today, due to she had an appointment with her surgeon the same time.  Call spoke to [place] (Support RTT) so they are aware.

## 2022-10-27 ENCOUNTER — Ambulatory Visit: Admission: RE | Admit: 2022-10-27 | Payer: Medicaid Other | Source: Ambulatory Visit

## 2022-10-27 ENCOUNTER — Other Ambulatory Visit: Payer: Self-pay

## 2022-10-27 DIAGNOSIS — C50411 Malignant neoplasm of upper-outer quadrant of right female breast: Secondary | ICD-10-CM | POA: Diagnosis not present

## 2022-10-27 LAB — RAD ONC ARIA SESSION SUMMARY
Course Elapsed Days: 19
Plan Fractions Treated to Date: 8
Plan Prescribed Dose Per Fraction: 2.67 Gy
Plan Total Fractions Prescribed: 15
Plan Total Prescribed Dose: 40.05 Gy
Reference Point Dosage Given to Date: 21.36 Gy
Reference Point Session Dosage Given: 2.67 Gy
Session Number: 8

## 2022-10-28 ENCOUNTER — Ambulatory Visit: Payer: Medicaid Other

## 2022-10-28 ENCOUNTER — Ambulatory Visit
Admission: RE | Admit: 2022-10-28 | Discharge: 2022-10-28 | Disposition: A | Discharge status: Home / Self Care | Payer: Medicaid Other | Source: Ambulatory Visit | Attending: Radiation Oncology | Admitting: Radiation Oncology

## 2022-10-28 ENCOUNTER — Other Ambulatory Visit: Payer: Self-pay

## 2022-10-28 DIAGNOSIS — C50411 Malignant neoplasm of upper-outer quadrant of right female breast: Secondary | ICD-10-CM | POA: Diagnosis not present

## 2022-10-28 LAB — RAD ONC ARIA SESSION SUMMARY
Course Elapsed Days: 20
Plan Fractions Treated to Date: 9
Plan Prescribed Dose Per Fraction: 2.67 Gy
Plan Total Fractions Prescribed: 15
Plan Total Prescribed Dose: 40.05 Gy
Reference Point Dosage Given to Date: 24.03 Gy
Reference Point Session Dosage Given: 2.67 Gy
Session Number: 9

## 2022-10-29 ENCOUNTER — Other Ambulatory Visit: Payer: Self-pay

## 2022-10-29 ENCOUNTER — Ambulatory Visit: Payer: Medicaid Other

## 2022-10-29 ENCOUNTER — Other Ambulatory Visit: Payer: Self-pay | Admitting: Radiation Oncology

## 2022-10-29 ENCOUNTER — Ambulatory Visit
Admission: RE | Admit: 2022-10-29 | Discharge: 2022-10-29 | Disposition: A | Discharge status: Home / Self Care | Payer: Medicaid Other | Source: Ambulatory Visit | Attending: Radiation Oncology | Admitting: Radiation Oncology

## 2022-10-29 ENCOUNTER — Ambulatory Visit: Admission: RE | Admit: 2022-10-29 | Payer: Medicaid Other | Source: Ambulatory Visit

## 2022-10-29 ENCOUNTER — Encounter: Payer: Self-pay | Admitting: Radiation Oncology

## 2022-10-29 DIAGNOSIS — C50411 Malignant neoplasm of upper-outer quadrant of right female breast: Secondary | ICD-10-CM | POA: Diagnosis not present

## 2022-10-29 LAB — RAD ONC ARIA SESSION SUMMARY
Course Elapsed Days: 21
Plan Fractions Treated to Date: 10
Plan Prescribed Dose Per Fraction: 2.67 Gy
Plan Total Fractions Prescribed: 15
Plan Total Prescribed Dose: 40.05 Gy
Reference Point Dosage Given to Date: 26.7 Gy
Reference Point Session Dosage Given: 2.67 Gy
Session Number: 10

## 2022-10-29 MED ORDER — GABAPENTIN 100 MG PO CAPS: 100.0000 mg | ORAL_CAPSULE | Freq: Three times a day (TID) | ORAL | 0 refills | Status: AC

## 2022-10-30 ENCOUNTER — Other Ambulatory Visit: Payer: Self-pay

## 2022-10-30 ENCOUNTER — Ambulatory Visit: Payer: Medicaid Other

## 2022-10-30 ENCOUNTER — Ambulatory Visit
Admission: RE | Admit: 2022-10-30 | Discharge: 2022-10-30 | Disposition: A | Discharge status: Home / Self Care | Payer: Medicaid Other | Source: Ambulatory Visit | Attending: Radiation Oncology | Admitting: Radiation Oncology

## 2022-10-30 DIAGNOSIS — C50411 Malignant neoplasm of upper-outer quadrant of right female breast: Secondary | ICD-10-CM | POA: Diagnosis not present

## 2022-10-30 LAB — RAD ONC ARIA SESSION SUMMARY
Course Elapsed Days: 22
Plan Fractions Treated to Date: 11
Plan Prescribed Dose Per Fraction: 2.67 Gy
Plan Total Fractions Prescribed: 15
Plan Total Prescribed Dose: 40.05 Gy
Reference Point Dosage Given to Date: 29.37 Gy
Reference Point Session Dosage Given: 2.67 Gy
Session Number: 11

## 2022-10-31 ENCOUNTER — Ambulatory Visit: Payer: Medicaid Other

## 2022-11-03 ENCOUNTER — Encounter (HOSPITAL_COMMUNITY): Payer: Self-pay

## 2022-11-03 ENCOUNTER — Ambulatory Visit (INDEPENDENT_AMBULATORY_CARE_PROVIDER_SITE_OTHER): Payer: Medicaid Other

## 2022-11-03 ENCOUNTER — Ambulatory Visit (HOSPITAL_COMMUNITY)
Admission: RE | Admit: 2022-11-03 | Discharge: 2022-11-03 | Disposition: A | Discharge status: Home / Self Care | Payer: Medicaid Other | Source: Ambulatory Visit | Attending: Family Medicine | Admitting: Family Medicine

## 2022-11-03 VITALS — BP 116/80 | HR 97 | Temp 98.4°F | Resp 16 | Ht 66.5 in | Wt 125.0 lb

## 2022-11-03 DIAGNOSIS — M25511 Pain in right shoulder: Secondary | ICD-10-CM | POA: Diagnosis not present

## 2022-11-03 MED ORDER — KETOROLAC TROMETHAMINE 30 MG/ML IJ SOLN
INTRAMUSCULAR | Status: AC
  Filled 2022-11-03: qty 1

## 2022-11-03 MED ORDER — ACETAMINOPHEN-CODEINE 300-30 MG PO TABS: 1.0000 | ORAL_TABLET | Freq: Four times a day (QID) | ORAL | 0 refills | Status: DC | PRN

## 2022-11-03 MED ORDER — KETOROLAC TROMETHAMINE 30 MG/ML IJ SOLN
30.0000 mg | Freq: Once | INTRAMUSCULAR | Status: AC
  Administered 2022-11-03: 30 mg via INTRAMUSCULAR
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
## 2022-11-03 NOTE — Discharge Instructions (Signed)
X-ray results will go to your MyChart when it is read.  Tylenol with codeine--1 tablet every 6 hours as needed for pain
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
## 2022-11-04 ENCOUNTER — Ambulatory Visit: Payer: Medicaid Other

## 2022-11-04 ENCOUNTER — Inpatient Hospital Stay: Payer: Medicaid Other | Attending: Hematology and Oncology | Admitting: Hematology and Oncology

## 2022-11-04 ENCOUNTER — Telehealth: Payer: Self-pay

## 2022-11-04 ENCOUNTER — Ambulatory Visit: Payer: Medicaid Other | Admitting: Radiation Oncology
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
## 2022-11-05 ENCOUNTER — Ambulatory Visit (INDEPENDENT_AMBULATORY_CARE_PROVIDER_SITE_OTHER): Payer: Medicaid Other | Admitting: Primary Care

## 2022-11-05 ENCOUNTER — Ambulatory Visit: Payer: Medicaid Other

## 2022-11-05 ENCOUNTER — Telehealth (INDEPENDENT_AMBULATORY_CARE_PROVIDER_SITE_OTHER): Payer: Self-pay

## 2022-11-05 ENCOUNTER — Ambulatory Visit: Payer: Medicaid Other | Admitting: Radiation Oncology

## 2022-11-05 NOTE — Telephone Encounter (Signed)
Contacted pt to see if she would like to switch appt to virtual, still come in or reschedule pt didn't answer and was unable to lvm due to vm being full

## 2022-11-06 ENCOUNTER — Ambulatory Visit
Admission: RE | Admit: 2022-11-06 | Discharge: 2022-11-06 | Disposition: A | Discharge status: Home / Self Care | Payer: Medicaid Other | Source: Ambulatory Visit | Attending: Radiation Oncology | Admitting: Radiation Oncology

## 2022-11-06 ENCOUNTER — Other Ambulatory Visit: Payer: Self-pay

## 2022-11-06 ENCOUNTER — Ambulatory Visit: Payer: Medicaid Other

## 2022-11-06 DIAGNOSIS — Z170 Estrogen receptor positive status [ER+]: Secondary | ICD-10-CM | POA: Insufficient documentation

## 2022-11-06 DIAGNOSIS — C50411 Malignant neoplasm of upper-outer quadrant of right female breast: Secondary | ICD-10-CM | POA: Diagnosis present

## 2022-11-06 LAB — RAD ONC ARIA SESSION SUMMARY
Course Elapsed Days: 29
Plan Fractions Treated to Date: 12
Plan Prescribed Dose Per Fraction: 2.67 Gy
Plan Total Fractions Prescribed: 15
Plan Total Prescribed Dose: 40.05 Gy
Reference Point Dosage Given to Date: 32.04 Gy
Reference Point Session Dosage Given: 2.67 Gy
Session Number: 12

## 2022-11-07 ENCOUNTER — Ambulatory Visit: Payer: Medicaid Other

## 2022-11-10 ENCOUNTER — Ambulatory Visit: Payer: Medicaid Other

## 2022-11-10 ENCOUNTER — Other Ambulatory Visit: Payer: Self-pay

## 2022-11-10 ENCOUNTER — Ambulatory Visit: Payer: Medicaid Other | Admitting: Radiation Oncology

## 2022-11-10 ENCOUNTER — Ambulatory Visit
Admission: RE | Admit: 2022-11-10 | Discharge: 2022-11-10 | Disposition: A | Discharge status: Home / Self Care | Payer: Medicaid Other | Source: Ambulatory Visit | Attending: Radiation Oncology | Admitting: Radiation Oncology

## 2022-11-10 DIAGNOSIS — C50411 Malignant neoplasm of upper-outer quadrant of right female breast: Secondary | ICD-10-CM | POA: Diagnosis not present

## 2022-11-10 LAB — RAD ONC ARIA SESSION SUMMARY
Course Elapsed Days: 33
Plan Fractions Treated to Date: 13
Plan Prescribed Dose Per Fraction: 2.67 Gy
Plan Total Fractions Prescribed: 15
Plan Total Prescribed Dose: 40.05 Gy
Reference Point Dosage Given to Date: 34.71 Gy
Reference Point Session Dosage Given: 2.67 Gy
Session Number: 13

## 2022-11-11 ENCOUNTER — Ambulatory Visit: Payer: Medicaid Other

## 2022-11-11 ENCOUNTER — Other Ambulatory Visit: Payer: Self-pay

## 2022-11-11 ENCOUNTER — Ambulatory Visit
Admission: RE | Admit: 2022-11-11 | Discharge: 2022-11-11 | Disposition: A | Discharge status: Home / Self Care | Payer: Medicaid Other | Source: Ambulatory Visit | Attending: Radiation Oncology | Admitting: Radiation Oncology

## 2022-11-11 DIAGNOSIS — C50411 Malignant neoplasm of upper-outer quadrant of right female breast: Secondary | ICD-10-CM | POA: Diagnosis not present

## 2022-11-11 LAB — RAD ONC ARIA SESSION SUMMARY
Course Elapsed Days: 34
Plan Fractions Treated to Date: 14
Plan Prescribed Dose Per Fraction: 2.67 Gy
Plan Total Fractions Prescribed: 15
Plan Total Prescribed Dose: 40.05 Gy
Reference Point Dosage Given to Date: 37.38 Gy
Reference Point Session Dosage Given: 2.67 Gy
Session Number: 14

## 2022-11-12 ENCOUNTER — Telehealth: Payer: Self-pay | Admitting: Radiation Oncology

## 2022-11-12 ENCOUNTER — Ambulatory Visit: Payer: Medicaid Other

## 2022-11-12 ENCOUNTER — Ambulatory Visit
Admission: RE | Admit: 2022-11-12 | Discharge: 2022-11-12 | Disposition: A | Discharge status: Home / Self Care | Payer: Medicaid Other | Source: Ambulatory Visit | Attending: Radiation Oncology | Admitting: Radiation Oncology

## 2022-11-12 ENCOUNTER — Other Ambulatory Visit: Payer: Self-pay

## 2022-11-12 DIAGNOSIS — C50411 Malignant neoplasm of upper-outer quadrant of right female breast: Secondary | ICD-10-CM | POA: Diagnosis not present

## 2022-11-12 LAB — RAD ONC ARIA SESSION SUMMARY
Course Elapsed Days: 35
Plan Fractions Treated to Date: 15
Plan Prescribed Dose Per Fraction: 2.67 Gy
Plan Total Fractions Prescribed: 15
Plan Total Prescribed Dose: 40.05 Gy
Reference Point Dosage Given to Date: 40.05 Gy
Reference Point Session Dosage Given: 2.67 Gy
Session Number: 15

## 2022-11-12 NOTE — Telephone Encounter (Signed)
Pt called to advise she was running behind and would be at our office within . Attempted to call treatment machine and RTT Support to advise, no answer. Sent email to notify treatment team.

## 2022-11-13 ENCOUNTER — Ambulatory Visit: Payer: Medicaid Other

## 2022-11-14 ENCOUNTER — Other Ambulatory Visit: Payer: Self-pay

## 2022-11-14 ENCOUNTER — Ambulatory Visit
Admission: RE | Admit: 2022-11-14 | Discharge: 2022-11-14 | Disposition: A | Discharge status: Home / Self Care | Payer: Medicaid Other | Source: Ambulatory Visit | Attending: Radiation Oncology | Admitting: Radiation Oncology

## 2022-11-14 ENCOUNTER — Ambulatory Visit: Payer: Medicaid Other

## 2022-11-14 DIAGNOSIS — C50411 Malignant neoplasm of upper-outer quadrant of right female breast: Secondary | ICD-10-CM | POA: Diagnosis not present

## 2022-11-14 LAB — RAD ONC ARIA SESSION SUMMARY
Course Elapsed Days: 37
Plan Fractions Treated to Date: 1
Plan Prescribed Dose Per Fraction: 2 Gy
Plan Total Fractions Prescribed: 6
Plan Total Prescribed Dose: 12 Gy
Reference Point Dosage Given to Date: 2 Gy
Reference Point Session Dosage Given: 2 Gy
Session Number: 16

## 2022-11-17 ENCOUNTER — Encounter (HOSPITAL_COMMUNITY): Payer: Self-pay

## 2022-11-17 ENCOUNTER — Ambulatory Visit: Payer: Medicaid Other

## 2022-11-17 ENCOUNTER — Inpatient Hospital Stay (HOSPITAL_COMMUNITY)
Admission: EM | Admit: 2022-11-17 | Discharge: 2022-11-20 | DRG: 391 | Disposition: A | Discharge status: Home / Self Care | Payer: Medicaid Other | Attending: Internal Medicine | Admitting: Internal Medicine

## 2022-11-17 ENCOUNTER — Emergency Department (HOSPITAL_COMMUNITY): Payer: Medicaid Other

## 2022-11-17 ENCOUNTER — Other Ambulatory Visit: Payer: Self-pay

## 2022-11-17 ENCOUNTER — Observation Stay (HOSPITAL_COMMUNITY): Payer: Medicaid Other

## 2022-11-17 DIAGNOSIS — K219 Gastro-esophageal reflux disease without esophagitis: Secondary | ICD-10-CM | POA: Diagnosis not present

## 2022-11-17 DIAGNOSIS — K5792 Diverticulitis of intestine, part unspecified, without perforation or abscess without bleeding: Secondary | ICD-10-CM | POA: Diagnosis not present

## 2022-11-17 DIAGNOSIS — Z170 Estrogen receptor positive status [ER+]: Secondary | ICD-10-CM

## 2022-11-17 DIAGNOSIS — Z5986 Financial insecurity: Secondary | ICD-10-CM

## 2022-11-17 DIAGNOSIS — D7389 Other diseases of spleen: Secondary | ICD-10-CM | POA: Diagnosis present

## 2022-11-17 DIAGNOSIS — K529 Noninfective gastroenteritis and colitis, unspecified: Secondary | ICD-10-CM | POA: Diagnosis present

## 2022-11-17 DIAGNOSIS — K631 Perforation of intestine (nontraumatic): Secondary | ICD-10-CM

## 2022-11-17 DIAGNOSIS — Z888 Allergy status to other drugs, medicaments and biological substances status: Secondary | ICD-10-CM

## 2022-11-17 DIAGNOSIS — Z9011 Acquired absence of right breast and nipple: Secondary | ICD-10-CM

## 2022-11-17 DIAGNOSIS — K5080 Crohn's disease of both small and large intestine without complications: Secondary | ICD-10-CM | POA: Diagnosis present

## 2022-11-17 DIAGNOSIS — K750 Abscess of liver: Secondary | ICD-10-CM | POA: Diagnosis present

## 2022-11-17 DIAGNOSIS — R000 Tachycardia, unspecified: Secondary | ICD-10-CM | POA: Diagnosis present

## 2022-11-17 DIAGNOSIS — Z5941 Food insecurity: Secondary | ICD-10-CM

## 2022-11-17 DIAGNOSIS — Z86010 Personal history of colonic polyps: Secondary | ICD-10-CM

## 2022-11-17 DIAGNOSIS — D638 Anemia in other chronic diseases classified elsewhere: Secondary | ICD-10-CM | POA: Diagnosis present

## 2022-11-17 DIAGNOSIS — K651 Peritoneal abscess: Secondary | ICD-10-CM | POA: Diagnosis present

## 2022-11-17 DIAGNOSIS — C50411 Malignant neoplasm of upper-outer quadrant of right female breast: Secondary | ICD-10-CM | POA: Diagnosis present

## 2022-11-17 DIAGNOSIS — Z91411 Personal history of adult psychological abuse: Secondary | ICD-10-CM

## 2022-11-17 DIAGNOSIS — Z881 Allergy status to other antibiotic agents status: Secondary | ICD-10-CM

## 2022-11-17 DIAGNOSIS — Z79899 Other long term (current) drug therapy: Secondary | ICD-10-CM

## 2022-11-17 DIAGNOSIS — Z882 Allergy status to sulfonamides status: Secondary | ICD-10-CM

## 2022-11-17 DIAGNOSIS — Z885 Allergy status to narcotic agent status: Secondary | ICD-10-CM

## 2022-11-17 DIAGNOSIS — F17210 Nicotine dependence, cigarettes, uncomplicated: Secondary | ICD-10-CM | POA: Diagnosis present

## 2022-11-17 DIAGNOSIS — K5720 Diverticulitis of large intestine with perforation and abscess without bleeding: Principal | ICD-10-CM | POA: Diagnosis present

## 2022-11-17 LAB — COMPREHENSIVE METABOLIC PANEL
ALT: 13 U/L (ref 0–44)
AST: 16 U/L (ref 15–41)
Albumin: 2.6 g/dL — ABNORMAL LOW (ref 3.5–5.0)
Alkaline Phosphatase: 80 U/L (ref 38–126)
Anion gap: 9 (ref 5–15)
BUN: 7 mg/dL (ref 6–20)
CO2: 26 mmol/L (ref 22–32)
Calcium: 8.7 mg/dL — ABNORMAL LOW (ref 8.9–10.3)
Chloride: 100 mmol/L (ref 98–111)
Creatinine, Ser: 0.87 mg/dL (ref 0.44–1.00)
GFR, Estimated: >60 mL/min (ref >60)
Glucose, Bld: 99 mg/dL (ref 70–99)
Potassium: 3.7 mmol/L (ref 3.5–5.1)
Sodium: 135 mmol/L (ref 135–145)
Total Bilirubin: 0.5 mg/dL (ref 0.3–1.2)
Total Protein: 8.3 g/dL — ABNORMAL HIGH (ref 6.5–8.1)

## 2022-11-17 LAB — URINALYSIS, ROUTINE W REFLEX MICROSCOPIC
Glucose, UA: NEGATIVE mg/dL
Ketones, ur: 15 mg/dL — AB
Leukocytes,Ua: NEGATIVE
Nitrite: NEGATIVE
Protein, ur: 100 mg/dL — AB
Specific Gravity, Urine: 1.015 (ref 1.005–1.030)
pH: 6 (ref 5.0–8.0)

## 2022-11-17 LAB — HIV ANTIBODY (ROUTINE TESTING W REFLEX): HIV Screen 4th Generation wRfx: NONREACTIVE

## 2022-11-17 LAB — CBC
HCT: 35 % — ABNORMAL LOW (ref 36.0–46.0)
Hemoglobin: 11.1 g/dL — ABNORMAL LOW (ref 12.0–15.0)
MCH: 27.2 pg (ref 26.0–34.0)
MCHC: 31.7 g/dL (ref 30.0–36.0)
MCV: 85.8 fL (ref 80.0–100.0)
Platelets: 392 10*3/uL (ref 150–400)
RBC: 4.08 MIL/uL (ref 3.87–5.11)
RDW: 14.3 % (ref 11.5–15.5)
WBC: 9.6 10*3/uL (ref 4.0–10.5)
nRBC: 0 % (ref 0.0–0.2)

## 2022-11-17 LAB — URINALYSIS, MICROSCOPIC (REFLEX): RBC / HPF: >50 RBC/hpf (ref 0–5)

## 2022-11-17 LAB — LIPASE, BLOOD: Lipase: 22 U/L (ref 11–51)

## 2022-11-17 LAB — I-STAT CG4 LACTIC ACID, ED: Lactic Acid, Venous: 0.8 mmol/L (ref 0.5–1.9)

## 2022-11-17 LAB — HCG, SERUM, QUALITATIVE: Preg, Serum: NEGATIVE

## 2022-11-17 MED ORDER — ACETAMINOPHEN 650 MG RE SUPP: 650.0000 mg | Freq: Four times a day (QID) | RECTAL | Status: DC | PRN

## 2022-11-17 MED ORDER — ZOLPIDEM TARTRATE 5 MG PO TABS
5.0000 mg | ORAL_TABLET | Freq: Every evening | ORAL | Status: DC | PRN
  Administered 2022-11-17 – 2022-11-19 (×3): 5 mg via ORAL
  Filled 2022-11-17 (×3): qty 1

## 2022-11-17 MED ORDER — BISMUTH SUBSALICYLATE 262 MG/15ML PO SUSP
30.0000 mL | Freq: Four times a day (QID) | ORAL | Status: DC | PRN
  Filled 2022-11-17: qty 236

## 2022-11-17 MED ORDER — POLYETHYLENE GLYCOL 3350 17 G PO PACK: 17.0000 g | PACK | Freq: Every day | ORAL | Status: DC | PRN

## 2022-11-17 MED ORDER — PIPERACILLIN-TAZOBACTAM 3.375 G IVPB 30 MIN
3.3750 g | Freq: Once | INTRAVENOUS | Status: AC
  Administered 2022-11-17: 3.375 g via INTRAVENOUS
  Filled 2022-11-17: qty 50

## 2022-11-17 MED ORDER — MORPHINE SULFATE (PF) 4 MG/ML IV SOLN
4.0000 mg | Freq: Once | INTRAVENOUS | Status: AC
  Administered 2022-11-17: 4 mg via INTRAVENOUS
  Filled 2022-11-17: qty 1

## 2022-11-17 MED ORDER — HYDROMORPHONE HCL 1 MG/ML IJ SOLN
0.5000 mg | INTRAMUSCULAR | Status: DC | PRN
  Administered 2022-11-17: 0.5 mg via INTRAVENOUS
  Administered 2022-11-17: 1 mg via INTRAVENOUS
  Administered 2022-11-18 – 2022-11-19 (×5): 0.5 mg via INTRAVENOUS
  Filled 2022-11-17 (×2): qty 0.5
  Filled 2022-11-17: qty 1
  Filled 2022-11-17 (×5): qty 0.5

## 2022-11-17 MED ORDER — ONDANSETRON HCL 4 MG/2ML IJ SOLN
4.0000 mg | Freq: Once | INTRAMUSCULAR | Status: AC
  Administered 2022-11-17: 4 mg via INTRAVENOUS
  Filled 2022-11-17: qty 2

## 2022-11-17 MED ORDER — HEPARIN SODIUM (PORCINE) 5000 UNIT/ML IJ SOLN
5000.0000 [IU] | Freq: Three times a day (TID) | INTRAMUSCULAR | Status: DC
  Administered 2022-11-17 – 2022-11-20 (×10): 5000 [IU] via SUBCUTANEOUS
  Filled 2022-11-17 (×10): qty 1

## 2022-11-17 MED ORDER — ACETAMINOPHEN 325 MG PO TABS: 650.0000 mg | ORAL_TABLET | Freq: Four times a day (QID) | ORAL | Status: DC | PRN

## 2022-11-17 MED ORDER — LACTATED RINGERS IV BOLUS
1000.0000 mL | Freq: Once | INTRAVENOUS | Status: AC
  Administered 2022-11-17: 1000 mL via INTRAVENOUS

## 2022-11-17 MED ORDER — SODIUM CHLORIDE 0.9% FLUSH
3.0000 mL | Freq: Two times a day (BID) | INTRAVENOUS | Status: DC
  Administered 2022-11-17 – 2022-11-20 (×7): 3 mL via INTRAVENOUS

## 2022-11-17 MED ORDER — SODIUM CHLORIDE 0.9 % IV SOLN: INTRAVENOUS | Status: DC

## 2022-11-17 MED ORDER — PIPERACILLIN-TAZOBACTAM 3.375 G IVPB
3.3750 g | Freq: Three times a day (TID) | INTRAVENOUS | Status: DC
  Administered 2022-11-17 – 2022-11-18 (×2): 3.375 g via INTRAVENOUS
  Filled 2022-11-17 (×2): qty 50

## 2022-11-17 MED ORDER — GADOBUTROL 1 MMOL/ML IV SOLN
5.0000 mL | Freq: Once | INTRAVENOUS | Status: AC | PRN
  Administered 2022-11-17: 5 mL via INTRAVENOUS

## 2022-11-17 MED ORDER — GABAPENTIN 100 MG PO CAPS
100.0000 mg | ORAL_CAPSULE | Freq: Three times a day (TID) | ORAL | Status: DC
  Administered 2022-11-17 – 2022-11-20 (×10): 100 mg via ORAL
  Filled 2022-11-17 (×10): qty 1

## 2022-11-17 MED ORDER — IOHEXOL 350 MG/ML SOLN
75.0000 mL | Freq: Once | INTRAVENOUS | Status: AC | PRN
  Administered 2022-11-17: 75 mL via INTRAVENOUS

## 2022-11-17 MED ORDER — SODIUM CHLORIDE 0.9 % IV BOLUS
500.0000 mL | Freq: Once | INTRAVENOUS | Status: AC
  Administered 2022-11-17: 500 mL via INTRAVENOUS

## 2022-11-17 NOTE — H&P (Signed)
History and Physical   Nicole Browning QIH:[id number] DOB: 1970/03/22 DOA: 11/17/2022  PCP: Grayce Sessions, NP   Patient coming from: Home  Chief Complaint: Abdominal pain  HPI: Nicole Browning is a 52 y.o. female with medical history significant of GERD, anemia, breast cancer, diverticulitis, IBD presenting with abdominal pain.  Patient reports 2 days of worsening abdominal pain similar to when she was previously admitted with liver abscess.  Pain is diffuse but is worse on the left side.  Denies any fever, nausea, vomiting, diarrhea.  Has tried some ibuprofen at home without any relief.  Further denies chills, chest pain, shortness of breath, constipation.  ED Course: Vital signs in the ED notable for blood pressure in the 110s to 150s systolic, heart rate in the 100s to 110s, respiratory rate in the teens to 20s.  Lab workup included CMP with calcium 8.7, protein 8.3, albumin 2.6.  CBC with hemoglobin stable 11.1.  No leukocytosis.  Lactic acid normal.  Lipase normal.  Urinalysis with hemoglobin, bilirubin, ketones, protein, rare bacteria.  Blood cultures pending.  CT the abdomen pelvis with contrast showed sigmoid diverticulitis, extraluminal loculated fluid consistent with abscesses, scattered intra-abdominal free air, new hepatic cystic like area suspicious for hepatic abscesses, questionable portal vein thrombosis with recommendation for MRI to further evaluate, possible new solid appearing splenic lesion.  General surgery consulted in the ED and are following, initially recommending conservative management.  Patient has received morphine, Zosyn, Zofran, 1.5 L of IV fluids in the ED.  Review of Systems: As per HPI otherwise all other systems reviewed and are negative.  Past Medical History:  Diagnosis Date   AKI (acute kidney injury) (HCC) 10/03/2020   Anemia    Diverticulitis 12/13/2020   GERD (gastroesophageal reflux disease)    IBS (irritable bowel syndrome)    Perirectal  abscess    Protein-calorie malnutrition (HCC) 02/29/2016   Tobacco use    UC (ulcerative colitis) (HCC)     Past Surgical History:  Procedure Laterality Date   BIOPSY  09/29/2020   Procedure: BIOPSY;  Surgeon: Lynann Bologna, MD;  Location: [facility] ENDOSCOPY;  Service: Endoscopy;;   BREAST BIOPSY Right 06/24/2022   [place] RT BREAST BX W LOC DEV 1ST LESION IMG BX SPEC US GUIDE 06/24/2022 [facility]   BREAST BIOPSY Right 06/24/2022   [place] RT BREAST BX W LOC DEV EA ADD LESION IMG BX SPEC US GUIDE 06/24/2022 [facility]   BREAST BIOPSY  08/13/2022   MM RT RADIOACTIVE SEED LOC MAMMO GUIDE 08/13/2022 [facility]   BREAST LUMPECTOMY WITH RADIOACTIVE SEED AND SENTINEL LYMPH NODE BIOPSY Right 08/14/2022   Procedure: RIGHT BREAST LUMPECTOMY WITH RADIOACTIVE SEED AND SENTINEL LYMPH NODE BIOPSY;  Surgeon: Almond Lint, MD;  Location: [facility];  Service: General;  Laterality: Right;   ESOPHAGOGASTRODUODENOSCOPY (EGD) WITH PROPOFOL N/A 09/29/2020   Procedure: ESOPHAGOGASTRODUODENOSCOPY (EGD) WITH PROPOFOL;  Surgeon: Lynann Bologna, MD;  Location: [facility] ENDOSCOPY;  Service: Endoscopy;  Laterality: N/A;   IR RADIOLOGIST EVAL & MGMT  10/17/2020   IRRIGATION AND DEBRIDEMENT ABSCESS N/A 02/24/2016   Procedure: IRRIGATION AND DEBRIDEMENT ABSCESS;  Surgeon: Almond Lint, MD;  Location: [facility] OR;  Service: General;  Laterality: N/A;    Social History  reports that she has been smoking cigarettes. She has a 9.2 pack-year smoking history. She has never used smokeless tobacco. She reports current alcohol use of about 2.0 standard drinks of alcohol per week. She reports that she does not use drugs.  Allergies  Allergen  Reactions   Bactrim [Sulfamethoxazole-Trimethoprim] Hives    Immediate body wide hives   Percocet [Oxycodone-Acetaminophen] Nausea Only   Valium [Diazepam] Nausea Only    Family History  Problem Relation Age of Onset   Diabetes Mother    COPD Father    Leukemia Brother     Colon cancer Neg Hx    Colon polyps Neg Hx    Esophageal cancer Neg Hx    Rectal cancer Neg Hx    Stomach cancer Neg Hx   Reviewed on admission  Prior to Admission medications   Medication Sig Start Date End Date Taking? Authorizing Provider  acetaminophen-codeine (TYLENOL #3) 300-30 MG tablet Take 1 tablet by mouth every 6 (six) hours as needed (pain). 11/03/22  Yes Banister, Janace Aris, MD  bismuth subsalicylate (PEPTO BISMOL) 262 MG/15ML suspension Take 30 mLs by mouth every 6 (six) hours as needed for indigestion.   Yes [provider]  gabapentin (NEURONTIN) 100 MG capsule Take 1 capsule (100 mg total) by mouth 3 (three) times daily. 10/29/22  Yes Antony Blackbird, MD  Multiple Vitamins-Minerals (WOMENS MULTIVITAMIN PO) Take 1 tablet by mouth daily.   Yes [provider]  POTASSIUM PO Take 1 tablet by mouth daily.   Yes [provider]  traMADol (ULTRAM) 50 MG tablet Take 50 mg by mouth every 6 (six) hours as needed. 08/15/22  Yes [provider]    Physical Exam: Vitals:   11/17/22 0834 11/17/22 1000 11/17/22 1142 11/17/22 1219  BP: (!) 157/105 121/78 112/75   Pulse: (!) 115  (!) 107   Resp: 20 19 (!) 22   Temp: 98.2 F (36.8 C)   98.2 F (36.8 C)  TempSrc: Oral   Oral  SpO2: 100%  97%   Weight: 56.7 kg     Height: 5' 4.5" (1.638 m)       Physical Exam Constitutional:      General: She is not in acute distress.    Appearance: Normal appearance.  HENT:     Head: Normocephalic and atraumatic.     Mouth/Throat:     Mouth: Mucous membranes are moist.     Pharynx: Oropharynx is clear.  Eyes:     Extraocular Movements: Extraocular movements intact.     Pupils: Pupils are equal, round, and reactive to light.  Cardiovascular:     Rate and Rhythm: Regular rhythm. Tachycardia present.     Pulses: Normal pulses.     Heart sounds: Normal heart sounds.  Pulmonary:     Effort: Pulmonary effort is normal. No respiratory distress.     Breath sounds:  Normal breath sounds.  Abdominal:     General: Bowel sounds are normal. There is no distension.     Palpations: Abdomen is soft.     Tenderness: There is abdominal tenderness.  Musculoskeletal:        General: No swelling or deformity.  Skin:    General: Skin is warm and dry.  Neurological:     General: No focal deficit present.     Mental Status: Mental status is at baseline.    Labs on Admission: I have personally reviewed following labs and imaging studies  CBC: Recent Labs  Lab 11/17/22 0845  WBC 9.6  HGB 11.1*  HCT 35.0*  MCV 85.8  PLT 392    Basic Metabolic Panel: Recent Labs  Lab 11/17/22 0845  NA 135  K 3.7  CL 100  CO2 26  GLUCOSE 99  BUN 7  CREATININE  0.87  CALCIUM 8.7*    GFR: Estimated Creatinine Clearance: 66.8 mL/min (by C-G formula based on SCr of 0.87 mg/dL).  Liver Function Tests: Recent Labs  Lab 11/17/22 0845  AST 16  ALT 13  ALKPHOS 80  BILITOT 0.5  PROT 8.3*  ALBUMIN 2.6*    Urine analysis:    Component Value Date/Time   COLORURINE YELLOW 11/17/2022 1045   APPEARANCEUR CLEAR 11/17/2022 1045   LABSPEC 1.015 11/17/2022 1045   PHURINE 6.0 11/17/2022 1045   GLUCOSEU NEGATIVE 11/17/2022 1045   HGBUR LARGE (A) 11/17/2022 1045   BILIRUBINUR SMALL (A) 11/17/2022 1045   KETONESUR 15 (A) 11/17/2022 1045   PROTEINUR 100 (A) 11/17/2022 1045   NITRITE NEGATIVE 11/17/2022 1045   LEUKOCYTESUR NEGATIVE 11/17/2022 1045    Radiological Exams on Admission: CT ABDOMEN PELVIS W CONTRAST  Result Date: 11/17/2022 CLINICAL DATA:  Acute onset abdominal pain EXAM: CT ABDOMEN AND PELVIS WITH CONTRAST TECHNIQUE: Multidetector CT imaging of the abdomen and pelvis was performed using the standard protocol following bolus administration of intravenous contrast. RADIATION DOSE REDUCTION: This exam was performed according to the departmental dose-optimization program which includes automated exposure control, adjustment of the mA and/or kV according to  patient size and/or use of iterative reconstruction technique. CONTRAST:  75mL OMNIPAQUE IOHEXOL 350 MG/ML SOLN COMPARISON:  CT 11/29/2020 FINDINGS: Lower chest: There is some linear opacity lung bases likely scar or atelectasis. No pleural effusion. Hepatobiliary: Mild hepatomegaly. There are several ill-defined cystic areas in the right hepatic lobe involving segments 7, 8. Example segment 8 centrally on series 3, image 10 measuring 12 mm. These new from previous. There appear to be some areas of biliary ectasia and some distal branches of the portal vein which are poorly enhancing. Gallbladder is distended. Pancreas: Unremarkable. No pancreatic ductal dilatation or surrounding inflammatory changes. Spleen: Increasing low-attenuation splenic lesions. There are 2 lesion seen. Larger on series 3, image 14 now measures 2.4 by 1.9 cm. These are not clearly cysts. Adrenals/Urinary Tract: Adrenal glands are preserved. Stable Bosniak 1 midportion left-sided renal cyst. No enhancing renal mass. No collecting system dilatation. Preserved contours of the urinary bladder. Stomach/Bowel: On this non oral contrast exam there is significant wall thickening with stranding along the sigmoid colon with diverticula. There is some extraluminal loculated fluid and air extending lateral with multiple small pockets. The area overall measures up to 5.4 by 3.1 cm on series 3, image 67. There is extensive associated adjacent stranding. There is scattered free air throughout the mesentery including up along the margin of the liver and beneath the right hemidiaphragm consistent with components of perforation. Large bowel otherwise is nondilated with scattered stool. The small bowel is nondilated with a few scattered air-fluid levels. The stomach is underdistended. Question some wall thickening along the distal stomach as seen previously but again the stomach is underdistended. Vascular/Lymphatic: Few prominent pelvic lymph nodes on the left  which could be reactive including perirectal space nodes. Mild vascular calcifications. Normal caliber aorta and IVC Reproductive: Uterus and bilateral adnexa are unremarkable. Other: There is bandlike soft tissue thickening along the left ischioanal fossa. Previously there was a fistula in this location. This could be the sequela of previous fistula. No residual rim enhancing fluid collection or soft tissue gas. Please correlate for symptoms. Musculoskeletal: Stable sclerosis of the right sacroiliac joint. Scattered degenerative changes otherwise. IMPRESSION: Evidence of sigmoid colon diverticulitis with wall thickening, stranding. However there is also some extraluminal loculated areas of fluid along the left side  consistent with the areas of potential abscess. And there is also presence of scattered free air. Component of perforation. Free air extends throughout the abdomen including along the umbilicus and adjacent to the diaphragm. No component of obstruction. New from previous are some heterogeneous cystic like areas scattered in the right hepatic lobe . Based on appearance favor these being small abscesses. There is also a potential component of some distal branch portal vein areas of thrombosis. These could be evaluated further with pre and postcontrast dynamic MRI when clinically appropriate and as necessary. New solid-appearing splenic lesions. Additional workup when appropriate. Changes of healing anal fistula. Stable wall thickening of the distal stomach. Critical Value/emergent results were called by telephone at the time of interpretation on 11/17/2022 at 7:52 am to provider [facility] , who verbally acknowledged these results. Electronically Signed   By: Karen Kays M.D.   On: 11/17/2022 10:53    EKG: Independently reviewed.  Sinus rhythm at 96 bpm.  Some nonspecific T wave changes in minimal baseline wander.  Assessment/Plan Principal Problem:   Intra-abdominal abscess (HCC) Active  Problems:   GERD (gastroesophageal reflux disease)   Anemia of chronic disease   IBD (inflammatory bowel disease)   Liver abscess   Diverticulitis   Malignant neoplasm of upper-outer quadrant of right breast in female, estrogen receptor positive (HCC)   Diverticulitis Intra-abdominal abscess Hepatic abscess IBD > Patient presenting with 2 days of abdominal pain which is diffuse but worse on the left side.  No reported fevers, nausea, vomiting, diarrhea. > No leukocytosis in ED.  CT on pelvis however showed significant inflammation concerning for diverticulitis of the sigmoid colon as well as extraluminal loculated fluid consistent with abscesses and scattered intra-abdominal free air.  New hepatic cystic like area suspicious for hepatic abscesses also noted. > Patient has history of prior hepatic abscesses and bacteremia.  Also with evidence of IBD/UC on prior colonoscopy but not currently on medication. > General Surgery consulted in the ED recommending conservative measures with supportive care and antibiotics for now but will be following. - Monitor in progressive overnight - Appreciate general surgery recommendations and assistance - Continue with Zosyn - Continue with IV fluids - Continue as needed Dilaudid for moderate to severe pain - N.p.o. except sips with meds for now - Supportive care  ?Splenic vein thrombosis > Noted as possibility on CT findings with recommendation for MRI with and without for further evaluation. - MRI abdomen with and without contrast  GERD - Continue home Pepto-Bismol  Anemia > Stable 1.1 - Trend CBC  Breast cancer > Diagnosed in May of this year.  Status post lumpectomy currently undergoing radiation therapy. > Imaging did note new solid-appearing lesion of the spleen, this will need further follow-up  DVT prophylaxis: Heparin Code Status:   Full Family Communication:  None on admission  Disposition Plan:   Patient is  from:  Home  Anticipated DC to:  Home  Anticipated DC date:  1 to 7 days  Anticipated DC barriers: None  Consults called:  General Surgery Admission status:  Observation, progressive  Severity of Illness: The appropriate patient status for this patient is OBSERVATION. Observation status is judged to be reasonable and necessary in order to provide the required intensity of service to ensure the patient's safety. The patient's presenting symptoms, physical exam findings, and initial radiographic and laboratory data in the context of their medical condition is felt to place them at decreased risk for further clinical deterioration. Furthermore, it is anticipated  that the patient will be medically stable for discharge from the hospital within 2 midnights of admission.    Synetta Fail MD [facility]  How to contact the [facility] Attending or Consulting provider 7A - 7P or covering provider during after hours 7P -7A, for this patient?   Check the care team in [facility] and look for a) attending/consulting [name] provider listed and b) the [facility] team listed Log into [email or web address] and use [facility] universal password to access. If you do not have the password, please contact the hospital operator. Locate the [facility] provider you are looking for under [facility] and page to a number that you can be directly reached. If you still have difficulty reaching the provider, please page the [facility] (Director on Call) for the Hospitalists listed on amion for assistance.  11/17/2022, 12:34 PM

## 2022-11-17 NOTE — ED Notes (Signed)
GI PA at bedside.

## 2022-11-17 NOTE — ED Provider Notes (Signed)
[facility] EMERGENCY DEPARTMENT AT [facility] Provider Note   CSN: [id number] Arrival date & time: 11/17/22  7829     History  Chief Complaint  Patient presents with   Abdominal Pain    Nicole Browning is a 52 y.o. female.  52 year old female with prior medical history as detailed below presents for evaluation.  Patient complains of diffuse abdominal discomfort.  Symptoms began 2 days ago.  She reports continuous pain throughout her entire abdomen.  She reports that this morning the pain is more on the left side.  She denies nausea, vomiting, diarrhea.  She denies fever.  She took Motrin at home for pain.  This did not help significantly.  She denies chest pain or shortness of breath.  The history is provided by the patient and medical records.       Home Medications Prior to Admission medications   Medication Sig Start Date End Date Taking? Authorizing Provider  acetaminophen-codeine (TYLENOL #3) 300-30 MG tablet Take 1 tablet by mouth every 6 (six) hours as needed (pain). 11/03/22   Zenia Resides, MD  bismuth subsalicylate (PEPTO BISMOL) 262 MG/15ML suspension Take 30 mLs by mouth every 6 (six) hours as needed for indigestion.    [provider]  cetirizine (ZYRTEC ALLERGY) 10 MG tablet Take 1 tablet (10 mg total) by mouth at bedtime. 02/28/22 08/27/22  Theadora Rama Scales, PA-C  fluticasone (FLONASE) 50 MCG/ACT nasal spray Place 1 spray into both nostrils daily. Begin by using 2 sprays in each nare daily for 3 to 5 days, then decrease to 1 spray in each nare daily. 02/28/22   Theadora Rama Scales, PA-C  gabapentin (NEURONTIN) 100 MG capsule Take 1 capsule (100 mg total) by mouth 3 (three) times daily. 10/29/22   Antony Blackbird, MD  mesalamine (LIALDA) 1.2 g EC tablet Take 1 tablet (1.2 g total) by mouth in the morning and at bedtime. 07/11/20   Arnaldo Natal, NP  Multiple Vitamins-Minerals (WOMENS MULTIVITAMIN PO) Take 1 tablet by mouth daily.     [provider]  pantoprazole (PROTONIX) 40 MG tablet Take 1 tablet (40 mg total) by mouth daily. 10/06/20   Shon Hale, MD  POTASSIUM PO Take 1 tablet by mouth daily.    [provider]  Vitamin D, Ergocalciferol, (DRISDOL) 1.25 MG (50000 UNIT) CAPS capsule Take 1 capsule (50,000 Units total) by mouth every 7 (seven) days. TAKE ONE CAPSULE BY MOUTH ONCE WEEKLY 07/22/20   Arnaldo Natal, NP  VITAMIN E PO Take 1 tablet by mouth daily.    [provider]      Allergies    Bactrim [sulfamethoxazole-trimethoprim], Percocet [oxycodone-acetaminophen], and Valium [diazepam]    Review of Systems   Review of Systems  All other systems reviewed and are negative.   Physical Exam Updated Vital Signs BP (!) 157/105 (BP Location: Right Arm)   Pulse (!) 115   Temp 98.2 F (36.8 C) (Oral)   Resp 20   Ht 5' 4.5" (1.638 m)   Wt 56.7 kg   LMP 10/11/2022 (Approximate)   SpO2 100%   BMI 21.12 kg/m  Physical Exam Vitals and nursing note reviewed.  Constitutional:      General: She is not in acute distress.    Appearance: Normal appearance. She is well-developed.  HENT:     Head: Normocephalic and atraumatic.  Eyes:     Conjunctiva/sclera: Conjunctivae normal.     Pupils: Pupils are equal, round, and reactive to light.  Cardiovascular:     Rate and Rhythm: Normal rate and regular rhythm.     Heart sounds: Normal heart sounds.  Pulmonary:     Effort: Pulmonary effort is normal. No respiratory distress.     Breath sounds: Normal breath sounds.  Abdominal:     General: There is no distension.     Palpations: Abdomen is soft.     Tenderness: There is generalized abdominal tenderness.  Musculoskeletal:        General: No deformity. Normal range of motion.     Cervical back: Normal range of motion and neck supple.  Skin:    General: Skin is warm and dry.  Neurological:     General: No focal deficit present.     Mental Status: She is alert and  oriented to person, place, and time.     ED Results / Procedures / Treatments   Labs (all labs ordered are listed, but only abnormal results are displayed) Labs Reviewed  LIPASE, BLOOD  COMPREHENSIVE METABOLIC PANEL  CBC  URINALYSIS, ROUTINE W REFLEX MICROSCOPIC  HCG, SERUM, QUALITATIVE    EKG None  Radiology No results found.  Procedures Procedures    Medications Ordered in ED Medications  sodium chloride 0.9 % bolus 500 mL (has no administration in time range)  morphine (PF) 4 MG/ML injection 4 mg (has no administration in time range)  ondansetron (ZOFRAN) injection 4 mg (has no administration in time range)    ED Course/ Medical Decision Making/ A&P                                 Medical Decision Making Amount and/or Complexity of Data Reviewed Labs: ordered. Radiology: ordered.  Risk Prescription drug management.    Medical Screen Complete  This patient presented to the ED with complaint of abdominal pain.  This complaint involves an extensive number of treatment options. The initial differential diagnosis includes, but is not limited to, intra-abdominal pathology such as diverticulitis, perforation, abscess, metabolic abnormality, etc.  This presentation is: Acute, Self-Limited, Previously Undiagnosed, Uncertain Prognosis, Complicated, Systemic Symptoms, and Threat to Life/Bodily Function  Patient is presenting with diffuse abdominal pain x 48 hours.  Patient with history of prior diverticulitis with perforation and multiple intra-abdominal abscesses treated approximately 2 years ago.  Patient CT demonstrates evidence of recurrent sigmoid diverticulitis with likely perforation and a likely early abscesses.  Patient seen and evaluated by general surgery.  Conservative treatment favored by general surgery at this time.  Hospitalist service asked to admit.  Patient given IV antibiotics here in the ED.  Hospitalist service aware of case.   Additional  history obtained:  External records from outside sources obtained and reviewed including prior ED visits and prior Inpatient records.    Lab Tests:  I ordered and personally interpreted labs.  The pertinent results include: CBC, CMP, UA, lipase, blood cultures, lactic acid   Imaging Studies ordered:  I ordered imaging studies including CT abdomen pelvis I independently visualized and interpreted obtained imaging which showed diverticulitis, free air, likely early abscess I agree with the radiologist interpretation.   Cardiac Monitoring:  The patient was maintained on a cardiac monitor.  I personally viewed and interpreted the cardiac monitor which showed an underlying rhythm of: Sinus tach   Medicines ordered:  I ordered medication including antibiotics for intra abdominal infection Reevaluation of the patient after these medicines showed that the patient: improved  Problem List /  ED Course:  Abdominal pain   Reevaluation:  After the interventions noted above, I reevaluated the patient and found that they have: improved   Disposition:  After consideration of the diagnostic results and the patients response to treatment, I feel that the patent would benefit from admission.   CRITICAL CARE Performed by: Wynetta Fines   Total critical care time: 30 minutes  Critical care time was exclusive of separately billable procedures and treating other patients.  Critical care was necessary to treat or prevent imminent or life-threatening deterioration.  Critical care was time spent personally by me on the following activities: development of treatment plan with patient and/or surrogate as well as nursing, discussions with consultants, evaluation of patient's response to treatment, examination of patient, obtaining history from patient or surrogate, ordering and performing treatments and interventions, ordering and review of laboratory studies, ordering and review of  radiographic studies, pulse oximetry and re-evaluation of patient's condition.         Final Clinical Impression(s) / ED Diagnoses Final diagnoses:  Diverticulitis  Bowel perforation [facility])    Rx / DC Orders ED Discharge Orders     None         Wynetta Fines, MD 11/17/22 1229

## 2022-11-17 NOTE — ED Notes (Signed)
Pt to MRI

## 2022-11-17 NOTE — ED Notes (Signed)
Pt updated on plan of care

## 2022-11-17 NOTE — ED Notes (Signed)
Pt reports improved pain, but still unable to sleep. NAD noted at this time. Pt denies any unmet needs.

## 2022-11-17 NOTE — Consult Note (Addendum)
Nicole Browning Jul 16, 1970  [id number].    Requesting MD: Dr. 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  She was supposed to follow up with GI, Dr. Orvan Falconer whoever, she has not seen her in well over a year and I don't actually see any follow up from GI as an outpatient.  She currently does not take any medication for this.  From that time, she has not had any issues with her bowels.  She is currently undergoing radiation for DCIS, s/p lumpectomy for whic she is followed by Dr. Donell Beers.  She was in her recent state of health until this weekend when she developed diffuse abdominal pain.  She denies any N/V/D.  She had a BM yesterday and no blood was present.  She has had a good appetite and is eating well.  She denies any fevers.  Due to persistent pain, she presented to the ED where she has been found to have a WBC of 9.6, tachycardia in the 110s, lactic acid of 0.8, and a CT scan with sigmoid diverticulitis with wall thickening and stranding.  There is some extraluminal loculated areas of fluid with potential for abscess.  There is also some small scattered free air c/w a perforation, but likely contained given the small amount.  There are new heterogenous cystic like areas in her right hepatic lobe possibly favoring small abscesses and a possible distal branch portal vein thrombosis.  She also has new solid-appearing splenic lesions which will need additional workup given history of breast cancer.  We have been asked to see the patient for further recommendations.  ROS: ROS: please see  HPI  Family History  Problem Relation Age of Onset   Diabetes Mother    COPD Father    Leukemia Brother    Colon cancer Neg Hx    Colon polyps Neg Hx    Esophageal cancer Neg Hx    Rectal cancer Neg Hx    Stomach cancer Neg Hx     Past Medical History:  Diagnosis Date   AKI (acute kidney injury) (HCC) 10/03/2020   Anemia    Diverticulitis 12/13/2020   GERD (gastroesophageal reflux disease)    IBS (irritable bowel syndrome)    Perirectal abscess    Protein-calorie malnutrition (HCC) 02/29/2016   Tobacco use    UC (ulcerative colitis) (HCC)     Past Surgical History:  Procedure Laterality Date   BIOPSY  09/29/2020   Procedure: BIOPSY;  Surgeon: Lynann Bologna, MD;  Location: [facility] ENDOSCOPY;  Service: Endoscopy;;   BREAST BIOPSY Right 06/24/2022   [place] RT BREAST BX W LOC DEV 1ST LESION IMG BX SPEC US GUIDE 06/24/2022 [facility]   BREAST BIOPSY Right 06/24/2022   [place] RT BREAST BX W LOC DEV EA ADD LESION IMG BX SPEC US GUIDE 06/24/2022 [facility]   BREAST BIOPSY  08/13/2022   MM RT RADIOACTIVE SEED LOC MAMMO GUIDE 08/13/2022 [facility]   BREAST LUMPECTOMY WITH RADIOACTIVE SEED AND SENTINEL LYMPH NODE BIOPSY Right 08/14/2022   Procedure: RIGHT BREAST LUMPECTOMY WITH RADIOACTIVE SEED AND SENTINEL LYMPH NODE BIOPSY;  Surgeon: Almond Lint, MD;  Location: [facility];  Service: General;  Laterality: Right;   ESOPHAGOGASTRODUODENOSCOPY (EGD) WITH PROPOFOL N/A 09/29/2020   Procedure: ESOPHAGOGASTRODUODENOSCOPY (EGD) WITH PROPOFOL;  Surgeon: Lynann Bologna, MD;  Location: [facility] ENDOSCOPY;  Service: Endoscopy;  Laterality: N/A;   IR RADIOLOGIST EVAL & MGMT  10/17/2020   IRRIGATION AND DEBRIDEMENT ABSCESS N/A 02/24/2016   Procedure: IRRIGATION AND DEBRIDEMENT ABSCESS;  Surgeon: Almond Lint, MD;  Location: [facility] OR;  Service: General;  Laterality: N/A;    Social History:  reports that she has been smoking cigarettes. She has a 9.2 pack-year smoking history. She has  never used smokeless tobacco. She reports current alcohol use of about 2.0 standard drinks of alcohol per week. She reports that she does not use drugs.  Allergies:  Allergies  Allergen Reactions   Bactrim [Sulfamethoxazole-Trimethoprim] Hives    Immediate body wide hives   Percocet [Oxycodone-Acetaminophen] Nausea Only   Valium [Diazepam] Nausea Only    (Not in a hospital admission)    Physical Exam: Blood pressure 112/75, pulse (!) 107, temperature 98.2 F (36.8 C), temperature source Oral, resp. rate (!) 22, height 5' 4.5" (1.638 m), weight 56.7 kg, last menstrual period 10/11/2022, SpO2 97%. General: pleasant, WD, WN black female who is laying in bed in NAD HEENT: head is normocephalic, atraumatic.  Sclera are noninjected.  PERRL.  Ears and nose without any masses or lesions.  Mouth is pink and dry. Heart: regular, rate, and rhythm.  Normal s1,s2. No obvious murmurs, gallops, or rubs noted.  Palpable radial and pedal pulses bilaterally Lungs: CTAB, no wheezes, rhonchi, or rales noted.  Respiratory effort nonlabored Abd: soft, tender in suprapubic region and across her lower abdomen, ND, +BS, no masses or organomegaly.  Small soft umbilical hernia noted. MS: all 4 extremities are symmetrical with no cyanosis, clubbing, or edema. Psych: A&Ox3 with an appropriate affect.   Results for orders placed or performed during the hospital encounter of 11/17/22 (from the past 48 hour(s))  Lipase, blood     Status: None   Collection Time: 11/17/22  8:45 AM  Result Value Ref Range   Lipase 22 11 - 51 U/L    Comment: Performed at [facility], [address] [place], [place] [address]  Comprehensive metabolic panel     Status: Abnormal   Collection Time: 11/17/22  8:45 AM  Result Value Ref Range   Sodium 135 135 - 145 mmol/L   Potassium 3.7 3.5 - 5.1 mmol/L   Chloride 100 98 - 111 mmol/L   CO2 26 22 - 32 mmol/L   Glucose, Bld 99 70 - 99 mg/dL    Comment: Glucose reference range  applies only to samples taken after fasting for at least 8 hours.   BUN 7 6 - 20 mg/dL   Creatinine, Ser 6.21 0.44 - 1.00 mg/dL   Calcium 8.7 (L) 8.9 - 10.3 mg/dL   Total Protein 8.3 (H) 6.5 - 8.1 g/dL   Albumin 2.6 (L) 3.5 - 5.0 g/dL   AST 16 15 - 41 U/L   ALT 13 0 - 44 U/L   Alkaline Phosphatase 80 38 - 126 U/L   Total Bilirubin 0.5 0.3 - 1.2 mg/dL   GFR, Estimated >30 >86 mL/min    Comment: (NOTE) Calculated using the CKD-EPI Creatinine Equation (2021)    Anion gap 9 5 - 15    Comment: Performed at [facility], [address] [place], [place] [address]  CBC     Status:  Abnormal   Collection Time: 11/17/22  8:45 AM  Result Value Ref Range   WBC 9.6 4.0 - 10.5 K/uL   RBC 4.08 3.87 - 5.11 MIL/uL   Hemoglobin 11.1 (L) 12.0 - 15.0 g/dL   HCT 16.1 (L) 09.6 - 04.5 %   MCV 85.8 80.0 - 100.0 fL   MCH 27.2 26.0 - 34.0 pg   MCHC 31.7 30.0 - 36.0 g/dL   RDW 40.9 81.1 - 91.4 %   Platelets 392 150 - 400 K/uL   nRBC 0.0 0.0 - 0.2 %    Comment: Performed at [facility], [address] [place], [place] [address]  hCG, serum, qualitative     Status: None   Collection Time: 11/17/22  8:45 AM  Result Value Ref Range   Preg, Serum NEGATIVE NEGATIVE    Comment:        THE SENSITIVITY OF THIS METHODOLOGY IS >10 mIU/mL. Performed at [facility], [address], [place], [place] [address]   Urinalysis, Routine w reflex microscopic -Urine, Clean Catch     Status: Abnormal   Collection Time: 11/17/22 10:45 AM  Result Value Ref Range   Color, Urine YELLOW YELLOW   APPearance CLEAR CLEAR   Specific Gravity, Urine 1.015 1.005 - 1.030   pH 6.0 5.0 - 8.0   Glucose, UA NEGATIVE NEGATIVE mg/dL   Hgb urine dipstick LARGE (A) NEGATIVE   Bilirubin Urine SMALL (A) NEGATIVE   Ketones, ur 15 (A) NEGATIVE mg/dL   Protein, ur 865 (A) NEGATIVE mg/dL   Nitrite NEGATIVE NEGATIVE   Leukocytes,Ua NEGATIVE NEGATIVE    Comment: Performed at [facility], [address]  [place], [place] [address]  Urinalysis, Microscopic (reflex)     Status: Abnormal   Collection Time: 11/17/22 10:45 AM  Result Value Ref Range   RBC / HPF >50 0 - 5 RBC/hpf   WBC, UA 0-5 0 - 5 WBC/hpf   Bacteria, UA RARE (A) NONE SEEN   Squamous Epithelial / HPF 6-10 0 - 5 /HPF   Mucus PRESENT     Comment: Performed at [facility], [address], [place], [place] [address]  I-Stat Lactic Acid     Status: None   Collection Time: 11/17/22 11:53 AM  Result Value Ref Range   Lactic Acid, Venous 0.8 0.5 - 1.9 mmol/L   CT ABDOMEN PELVIS W CONTRAST  Result Date: 11/17/2022 CLINICAL DATA:  Acute onset abdominal pain EXAM: CT ABDOMEN AND PELVIS WITH CONTRAST TECHNIQUE: Multidetector CT imaging of the abdomen and pelvis was performed using the standard protocol following bolus administration of intravenous contrast. RADIATION DOSE REDUCTION: This exam was performed according to the departmental dose-optimization program which includes automated exposure control, adjustment of the mA and/or kV according to patient size and/or use of iterative reconstruction technique. CONTRAST:  75mL OMNIPAQUE IOHEXOL 350 MG/ML SOLN COMPARISON:  CT 11/29/2020 FINDINGS: Lower chest: There is some linear opacity lung bases likely scar or atelectasis. No pleural effusion. Hepatobiliary: Mild hepatomegaly. There are several ill-defined cystic areas in the right hepatic lobe involving segments 7, 8. Example segment 8 centrally on series 3, image 10 measuring 12 mm. These new from previous. There appear to be some areas of biliary ectasia and some distal branches of the portal vein which are poorly enhancing. Gallbladder is distended. Pancreas: Unremarkable. No pancreatic ductal dilatation or surrounding inflammatory changes. Spleen: Increasing low-attenuation splenic lesions. There are 2 lesion seen. Larger on series 3,  image 14 now measures 2.4 by 1.9 cm. These are not clearly cysts. Adrenals/Urinary Tract: Adrenal glands are  preserved. Stable Bosniak 1 midportion left-sided renal cyst. No enhancing renal mass. No collecting system dilatation. Preserved contours of the urinary bladder. Stomach/Bowel: On this non oral contrast exam there is significant wall thickening with stranding along the sigmoid colon with diverticula. There is some extraluminal loculated fluid and air extending lateral with multiple small pockets. The area overall measures up to 5.4 by 3.1 cm on series 3, image 67. There is extensive associated adjacent stranding. There is scattered free air throughout the mesentery including up along the margin of the liver and beneath the right hemidiaphragm consistent with components of perforation. Large bowel otherwise is nondilated with scattered stool. The small bowel is nondilated with a few scattered air-fluid levels. The stomach is underdistended. Question some wall thickening along the distal stomach as seen previously but again the stomach is underdistended. Vascular/Lymphatic: Few prominent pelvic lymph nodes on the left which could be reactive including perirectal space nodes. Mild vascular calcifications. Normal caliber aorta and IVC Reproductive: Uterus and bilateral adnexa are unremarkable. Other: There is bandlike soft tissue thickening along the left ischioanal fossa. Previously there was a fistula in this location. This could be the sequela of previous fistula. No residual rim enhancing fluid collection or soft tissue gas. Please correlate for symptoms. Musculoskeletal: Stable sclerosis of the right sacroiliac joint. Scattered degenerative changes otherwise. IMPRESSION: Evidence of sigmoid colon diverticulitis with wall thickening, stranding. However there is also some extraluminal loculated areas of fluid along the left side consistent with the areas of potential abscess. And there is also presence of scattered free air. Component of perforation. Free air extends throughout the abdomen including along the  umbilicus and adjacent to the diaphragm. No component of obstruction. New from previous are some heterogeneous cystic like areas scattered in the right hepatic lobe . Based on appearance favor these being small abscesses. There is also a potential component of some distal branch portal vein areas of thrombosis. These could be evaluated further with pre and postcontrast dynamic MRI when clinically appropriate and as necessary. New solid-appearing splenic lesions. Additional workup when appropriate. Changes of healing anal fistula. Stable wall thickening of the distal stomach. Critical Value/emergent results were called by telephone at the time of interpretation on 11/17/2022 at 7:52 am to provider [facility] , who verbally acknowledged these results. Electronically Signed   By: Karen Kays M.D.   On: 11/17/2022 10:53      Assessment/Plan Diverticulitis with perforation, possible small hepatic abscesses The patient has been seen, examined, chart, labs, vitals, and imaging personally reviewed.  She does appear to have diverticulitis despite a diagnosis of IBS as well.  She has had a perforation, but likely contained and sealed given the small amount of free air and non-peritonitic exam.  She is AF and her WBC is normal.  We will start with conservative management with bowel rest, except a few ice chips as well as IV abx therapy.  If she fails to improve, we did discuss ex lap with Hartmann's procedure.    FEN - NPO x ice/IVFs VTE - heparin ID - zosyn  Possible portal vein thrombosis - will d/w MD treatment needs for this. Breast cancer - DCIS currently undergoing radiation.  Followed by Dr. Al Pimple, Dr. Roselind Messier, and Dr. Donell Beers New solid-appearing splenic lesions - these will need work up as outpatient IBD - needs to follow up with GI as outpatient GERD  Tobacco abuse - only smokes during stressful times Anemia   I reviewed ED provider notes, hospitalist notes, last 24 h vitals and pain scores, last  48 h intake and output, last 24 h labs and trends, and last 24 h imaging results.  Letha Cape, [facility] 11/17/2022, 2:26 PM Please see Amion for pager number during day hours 7:00am-4:30pm or 7:00am -11:30am on weekends

## 2022-11-17 NOTE — Progress Notes (Signed)
Patient reported that she ate a granola bar out of her purse despite knowing she is NPO with ice chips. Primary, RN reeducated patient on the importance of adhering to diet restriction. Notified Dennard Nip, DO.

## 2022-11-17 NOTE — Progress Notes (Signed)
Pharmacy Antibiotic Note  Andrew Cislo is a 52 y.o. female admitted on 11/17/2022 with  intra-abdominal infection .  Pharmacy has been consulted for Zosyn (piperacillin-tazobactam) dosing.  WBC 9.6, afebrile SCr 0.87  Plan: Zosyn 3.375g IV q8h (4 hour infusion). Monitor daily CBC, temp, SCr, and for clinical signs of improvement  F/u cultures and de-escalate antibiotics as able   Height: 5' 4.5" (163.8 cm) Weight: 56.7 kg (125 lb) IBW/kg (Calculated) : 55.85  Temp (24hrs), Avg:98.2 F (36.8 C), Min:98.2 F (36.8 C), Max:98.2 F (36.8 C)  Recent Labs  Lab 11/17/22 0845 11/17/22 1153  WBC 9.6  --   CREATININE 0.87  --   LATICACIDVEN  --  0.8    Estimated Creatinine Clearance: 66.8 mL/min (by C-G formula based on SCr of 0.87 mg/dL).    Allergies  Allergen Reactions   Bactrim [Sulfamethoxazole-Trimethoprim] Hives    Immediate body wide hives   Percocet [Oxycodone-Acetaminophen] Nausea Only   Valium [Diazepam] Nausea Only    Antimicrobials this admission: Zosyn 9/16 >>   Dose adjustments this admission: N/A  Microbiology results: 9/16 BCx: sent   Thank you for allowing pharmacy to be a part of this patient's care.  Wilburn Cornelia, PharmD, BCPS Clinical Pharmacist 11/17/2022 1:15 PM   Please refer to [facility] for pharmacy phone number

## 2022-11-17 NOTE — ED Notes (Signed)
ED TO INPATIENT HANDOFF REPORT  ED Nurse Name and Phone #: Osvaldo Shipper RN [phone]  S Name/Age/Gender Nicole Browning 52 y.o. female Room/Bed: 028C/028C  Code Status   Code Status: Full Code  Home/SNF/Other Home Patient oriented to: self, place, time, and situation Is this baseline? Yes   Triage Complete: Triage complete  Chief Complaint Intra-abdominal abscess (HCC) [K65.1]  Triage Note Pt came in via POV d/t abd pain that began over the weekend & she reports that it is a similar feeling from when she had a liver infection in the past. Does reports she is a Breast [facility] pt & does radiation for cutrrently & is unsure what could have caused her pain. Rates pain 10/10, denies n/v/d.    Allergies Allergies  Allergen Reactions   Bactrim [Sulfamethoxazole-Trimethoprim] Hives    Immediate body wide hives   Percocet [Oxycodone-Acetaminophen] Nausea Only   Valium [Diazepam] Nausea Only    Level of Care/Admitting Diagnosis ED Disposition     ED Disposition  Admit   Condition  --   Comment  Hospital Area: [facility] [[id number]]  Level of Care: Progressive [102]  Admit to Progressive based on following criteria: MULTISYSTEM THREATS such as stable sepsis, metabolic/electrolyte imbalance with or without encephalopathy that is responding to early treatment.  Admit to Progressive based on following criteria: GI, ENDOCRINE disease patients with GI bleeding, acute liver failure or pancreatitis, stable with diabetic ketoacidosis or thyrotoxicosis (hypothyroid) state.  May place patient in observation at [facility] if equivalent level of care is available:: No  Covid Evaluation: Asymptomatic - no recent exposure (last 10 days) testing not required  Diagnosis: Intra-abdominal abscess [facility]) [[id number]]  Admitting Physician: Synetta Fail [[id number]]  Attending Physician: Synetta Fail [[id number]]          B Medical/Surgery History Past Medical  History:  Diagnosis Date   AKI (acute kidney injury) (HCC) 10/03/2020   Anemia    Diverticulitis 12/13/2020   GERD (gastroesophageal reflux disease)    IBS (irritable bowel syndrome)    Perirectal abscess    Protein-calorie malnutrition (HCC) 02/29/2016   Tobacco use    UC (ulcerative colitis) (HCC)    Past Surgical History:  Procedure Laterality Date   BIOPSY  09/29/2020   Procedure: BIOPSY;  Surgeon: Lynann Bologna, MD;  Location: [facility] ENDOSCOPY;  Service: Endoscopy;;   BREAST BIOPSY Right 06/24/2022   [place] RT BREAST BX W LOC DEV 1ST LESION IMG BX SPEC US GUIDE 06/24/2022 [facility]   BREAST BIOPSY Right 06/24/2022   [place] RT BREAST BX W LOC DEV EA ADD LESION IMG BX SPEC US GUIDE 06/24/2022 [facility]   BREAST BIOPSY  08/13/2022   MM RT RADIOACTIVE SEED LOC MAMMO GUIDE 08/13/2022 [facility]   BREAST LUMPECTOMY WITH RADIOACTIVE SEED AND SENTINEL LYMPH NODE BIOPSY Right 08/14/2022   Procedure: RIGHT BREAST LUMPECTOMY WITH RADIOACTIVE SEED AND SENTINEL LYMPH NODE BIOPSY;  Surgeon: Almond Lint, MD;  Location: [facility];  Service: General;  Laterality: Right;   ESOPHAGOGASTRODUODENOSCOPY (EGD) WITH PROPOFOL N/A 09/29/2020   Procedure: ESOPHAGOGASTRODUODENOSCOPY (EGD) WITH PROPOFOL;  Surgeon: Lynann Bologna, MD;  Location: [facility] ENDOSCOPY;  Service: Endoscopy;  Laterality: N/A;   IR RADIOLOGIST EVAL & MGMT  10/17/2020   IRRIGATION AND DEBRIDEMENT ABSCESS N/A 02/24/2016   Procedure: IRRIGATION AND DEBRIDEMENT ABSCESS;  Surgeon: Almond Lint, MD;  Location: [facility] OR;  Service: General;  Laterality: N/A;     A IV Location/Drains/Wounds Patient Lines/Drains/Airways Status  Active Line/Drains/Airways     Name Placement date Placement time Site Days   Peripheral IV 11/17/22 20 G Anterior;Left;Proximal Forearm 11/17/22  0908  Forearm  less than 1   Peripheral IV 11/17/22 20 G Posterior;Right Hand 11/17/22  1134  Hand  less than 1   Peripheral IV 11/17/22 20 G  Anterior;Right Forearm 11/17/22  1142  Forearm  less than 1   Closed System Drain Medial Abdomen 10 Fr. 10/03/20  1630  Abdomen  775   Open Drain 1 Left Buttock  02/24/16  1742  Buttock  2458   Wound / Incision (Open or Dehisced) 09/27/20 Puncture Abdomen Left 09/27/20  1125  Abdomen  781            Intake/Output Last 24 hours  Intake/Output Summary (Last 24 hours) at 11/17/2022 1246 Last data filed at 11/17/2022 1010 Gross per 24 hour  Intake 500 ml  Output --  Net 500 ml    Labs/Imaging Results for orders placed or performed during the hospital encounter of 11/17/22 (from the past 48 hour(s))  Lipase, blood     Status: None   Collection Time: 11/17/22  8:45 AM  Result Value Ref Range   Lipase 22 11 - 51 U/L    Comment: Performed at [facility], [address], [place], [place] [address]  Comprehensive metabolic panel     Status: Abnormal   Collection Time: 11/17/22  8:45 AM  Result Value Ref Range   Sodium 135 135 - 145 mmol/L   Potassium 3.7 3.5 - 5.1 mmol/L   Chloride 100 98 - 111 mmol/L   CO2 26 22 - 32 mmol/L   Glucose, Bld 99 70 - 99 mg/dL    Comment: Glucose reference range applies only to samples taken after fasting for at least 8 hours.   BUN 7 6 - 20 mg/dL   Creatinine, Ser 7.56 0.44 - 1.00 mg/dL   Calcium 8.7 (L) 8.9 - 10.3 mg/dL   Total Protein 8.3 (H) 6.5 - 8.1 g/dL   Albumin 2.6 (L) 3.5 - 5.0 g/dL   AST 16 15 - 41 U/L   ALT 13 0 - 44 U/L   Alkaline Phosphatase 80 38 - 126 U/L   Total Bilirubin 0.5 0.3 - 1.2 mg/dL   GFR, Estimated >43 >32 mL/min    Comment: (NOTE) Calculated using the CKD-EPI Creatinine Equation (2021)    Anion gap 9 5 - 15    Comment: Performed at [facility], [address] [place], [place] [address]  CBC     Status: Abnormal   Collection Time: 11/17/22  8:45 AM  Result Value Ref Range   WBC 9.6 4.0 - 10.5 K/uL   RBC 4.08 3.87 - 5.11 MIL/uL   Hemoglobin 11.1 (L) 12.0 - 15.0 g/dL   HCT 41.6 (L) 60.6 - 30.1 %   MCV  85.8 80.0 - 100.0 fL   MCH 27.2 26.0 - 34.0 pg   MCHC 31.7 30.0 - 36.0 g/dL   RDW 60.1 09.3 - 23.5 %   Platelets 392 150 - 400 K/uL   nRBC 0.0 0.0 - 0.2 %    Comment: Performed at [facility], [address] [place], [place] [address]  hCG, serum, qualitative     Status: None   Collection Time: 11/17/22  8:45 AM  Result Value Ref Range   Preg, Serum NEGATIVE NEGATIVE    Comment:        THE  SENSITIVITY OF THIS METHODOLOGY IS >10 mIU/mL. Performed at [facility], [address], [place], [place] [address]   Urinalysis, Routine w reflex microscopic -Urine, Clean Catch     Status: Abnormal   Collection Time: 11/17/22 10:45 AM  Result Value Ref Range   Color, Urine YELLOW YELLOW   APPearance CLEAR CLEAR   Specific Gravity, Urine 1.015 1.005 - 1.030   pH 6.0 5.0 - 8.0   Glucose, UA NEGATIVE NEGATIVE mg/dL   Hgb urine dipstick LARGE (A) NEGATIVE   Bilirubin Urine SMALL (A) NEGATIVE   Ketones, ur 15 (A) NEGATIVE mg/dL   Protein, ur 846 (A) NEGATIVE mg/dL   Nitrite NEGATIVE NEGATIVE   Leukocytes,Ua NEGATIVE NEGATIVE    Comment: Performed at [facility], [address] [place], [place] [address]  Urinalysis, Microscopic (reflex)     Status: Abnormal   Collection Time: 11/17/22 10:45 AM  Result Value Ref Range   RBC / HPF >50 0 - 5 RBC/hpf   WBC, UA 0-5 0 - 5 WBC/hpf   Bacteria, UA RARE (A) NONE SEEN   Squamous Epithelial / HPF 6-10 0 - 5 /HPF   Mucus PRESENT     Comment: Performed at [facility], [address] [place], [place] [address]  I-Stat Lactic Acid     Status: None   Collection Time: 11/17/22 11:53 AM  Result Value Ref Range   Lactic Acid, Venous 0.8 0.5 - 1.9 mmol/L   CT ABDOMEN PELVIS W CONTRAST  Result Date: 11/17/2022 CLINICAL DATA:  Acute onset abdominal pain EXAM: CT ABDOMEN AND PELVIS WITH CONTRAST TECHNIQUE: Multidetector CT imaging of the abdomen and pelvis was performed using the standard protocol following bolus administration of  intravenous contrast. RADIATION DOSE REDUCTION: This exam was performed according to the departmental dose-optimization program which includes automated exposure control, adjustment of the mA and/or kV according to patient size and/or use of iterative reconstruction technique. CONTRAST:  75mL OMNIPAQUE IOHEXOL 350 MG/ML SOLN COMPARISON:  CT 11/29/2020 FINDINGS: Lower chest: There is some linear opacity lung bases likely scar or atelectasis. No pleural effusion. Hepatobiliary: Mild hepatomegaly. There are several ill-defined cystic areas in the right hepatic lobe involving segments 7, 8. Example segment 8 centrally on series 3, image 10 measuring 12 mm. These new from previous. There appear to be some areas of biliary ectasia and some distal branches of the portal vein which are poorly enhancing. Gallbladder is distended. Pancreas: Unremarkable. No pancreatic ductal dilatation or surrounding inflammatory changes. Spleen: Increasing low-attenuation splenic lesions. There are 2 lesion seen. Larger on series 3, image 14 now measures 2.4 by 1.9 cm. These are not clearly cysts. Adrenals/Urinary Tract: Adrenal glands are preserved. Stable Bosniak 1 midportion left-sided renal cyst. No enhancing renal mass. No collecting system dilatation. Preserved contours of the urinary bladder. Stomach/Bowel: On this non oral contrast exam there is significant wall thickening with stranding along the sigmoid colon with diverticula. There is some extraluminal loculated fluid and air extending lateral with multiple small pockets. The area overall measures up to 5.4 by 3.1 cm on series 3, image 67. There is extensive associated adjacent stranding. There is scattered free air throughout the mesentery including up along the margin of the liver and beneath the right hemidiaphragm consistent with components of perforation. Large bowel otherwise is nondilated with scattered stool. The small bowel is nondilated with a few scattered air-fluid  levels. The stomach is underdistended. Question some wall thickening along the distal stomach as  seen previously but again the stomach is underdistended. Vascular/Lymphatic: Few prominent pelvic lymph nodes on the left which could be reactive including perirectal space nodes. Mild vascular calcifications. Normal caliber aorta and IVC Reproductive: Uterus and bilateral adnexa are unremarkable. Other: There is bandlike soft tissue thickening along the left ischioanal fossa. Previously there was a fistula in this location. This could be the sequela of previous fistula. No residual rim enhancing fluid collection or soft tissue gas. Please correlate for symptoms. Musculoskeletal: Stable sclerosis of the right sacroiliac joint. Scattered degenerative changes otherwise. IMPRESSION: Evidence of sigmoid colon diverticulitis with wall thickening, stranding. However there is also some extraluminal loculated areas of fluid along the left side consistent with the areas of potential abscess. And there is also presence of scattered free air. Component of perforation. Free air extends throughout the abdomen including along the umbilicus and adjacent to the diaphragm. No component of obstruction. New from previous are some heterogeneous cystic like areas scattered in the right hepatic lobe . Based on appearance favor these being small abscesses. There is also a potential component of some distal branch portal vein areas of thrombosis. These could be evaluated further with pre and postcontrast dynamic MRI when clinically appropriate and as necessary. New solid-appearing splenic lesions. Additional workup when appropriate. Changes of healing anal fistula. Stable wall thickening of the distal stomach. Critical Value/emergent results were called by telephone at the time of interpretation on 11/17/2022 at 7:52 am to provider [facility] , who verbally acknowledged these results. Electronically Signed   By: Karen Kays M.D.   On:  11/17/2022 10:53    Pending Labs Unresulted Labs (From admission, onward)     Start     Ordered   11/18/22 0500  Comprehensive metabolic panel  Tomorrow morning,   R        11/17/22 1232   11/18/22 0500  CBC  Tomorrow morning,   R        11/17/22 1232   11/17/22 1226  HIV Antibody (routine testing w rflx)  (HIV Antibody (Routine testing w reflex) panel)  Once,   R        11/17/22 1232   11/17/22 1107  Culture, blood (routine x 2)  BLOOD CULTURE X 2,   R (with STAT occurrences)      11/17/22 1106            Vitals/Pain Today's Vitals   11/17/22 0834 11/17/22 1000 11/17/22 1142 11/17/22 1219  BP: (!) 157/105 121/78 112/75   Pulse: (!) 115  (!) 107   Resp: 20 19 (!) 22   Temp: 98.2 F (36.8 C)   98.2 F (36.8 C)  TempSrc: Oral   Oral  SpO2: 100%  97%   Weight: 56.7 kg     Height: 5' 4.5" (1.638 m)     PainSc: 10-Worst pain ever 7       Isolation Precautions No active isolations  Medications Medications  gabapentin (NEURONTIN) capsule 100 mg (has no administration in time range)  heparin injection 5,000 Units (has no administration in time range)  sodium chloride flush (NS) 0.9 % injection 3 mL (has no administration in time range)  0.9 %  sodium chloride infusion (has no administration in time range)  acetaminophen (TYLENOL) tablet 650 mg (has no administration in time range)    Or  acetaminophen (TYLENOL) suppository 650 mg (has no administration in time range)  polyethylene glycol (MIRALAX / GLYCOLAX) packet 17 g (has no administration in time range)  HYDROmorphone (DILAUDID) injection 0.5-1 mg (has no administration in time range)  sodium chloride 0.9 % bolus 500 mL (0 mLs Intravenous Stopped 11/17/22 1010)  morphine (PF) 4 MG/ML injection 4 mg (4 mg Intravenous Given 11/17/22 0914)  ondansetron (ZOFRAN) injection 4 mg (4 mg Intravenous Given 11/17/22 0912)  iohexol (OMNIPAQUE) 350 MG/ML injection 75 mL (75 mLs Intravenous Contrast Given 11/17/22 1027)   piperacillin-tazobactam (ZOSYN) IVPB 3.375 g (3.375 g Intravenous New Bag/Given 11/17/22 1147)  lactated ringers bolus 1,000 mL (1,000 mLs Intravenous New Bag/Given 11/17/22 1139)    Mobility Walks      Focused Assessments GI perf   R Recommendations: See Admitting Provider Note  Report given to:   Additional Notes:

## 2022-11-17 NOTE — ED Triage Notes (Signed)
Pt came in via POV d/t abd pain that began over the weekend & she reports that it is a similar feeling from when she had a liver infection in the past. Does reports she is a Breast CA pt & does radiation for cutrrently & is unsure what could have caused her pain. Rates pain 10/10, denies n/v/d.

## 2022-11-17 NOTE — ED Notes (Signed)
IP provider at bedside.

## 2022-11-18 ENCOUNTER — Observation Stay (HOSPITAL_COMMUNITY): Payer: Medicaid Other

## 2022-11-18 ENCOUNTER — Ambulatory Visit: Payer: Medicaid Other

## 2022-11-18 DIAGNOSIS — Z881 Allergy status to other antibiotic agents status: Secondary | ICD-10-CM | POA: Diagnosis not present

## 2022-11-18 DIAGNOSIS — D7389 Other diseases of spleen: Secondary | ICD-10-CM | POA: Diagnosis present

## 2022-11-18 DIAGNOSIS — K750 Abscess of liver: Secondary | ICD-10-CM | POA: Diagnosis present

## 2022-11-18 DIAGNOSIS — Z91411 Personal history of adult psychological abuse: Secondary | ICD-10-CM | POA: Diagnosis not present

## 2022-11-18 DIAGNOSIS — K5720 Diverticulitis of large intestine with perforation and abscess without bleeding: Secondary | ICD-10-CM | POA: Diagnosis present

## 2022-11-18 DIAGNOSIS — D638 Anemia in other chronic diseases classified elsewhere: Secondary | ICD-10-CM | POA: Diagnosis present

## 2022-11-18 DIAGNOSIS — Z5986 Financial insecurity: Secondary | ICD-10-CM | POA: Diagnosis not present

## 2022-11-18 DIAGNOSIS — C50411 Malignant neoplasm of upper-outer quadrant of right female breast: Secondary | ICD-10-CM | POA: Diagnosis present

## 2022-11-18 DIAGNOSIS — R000 Tachycardia, unspecified: Secondary | ICD-10-CM | POA: Diagnosis present

## 2022-11-18 DIAGNOSIS — F17210 Nicotine dependence, cigarettes, uncomplicated: Secondary | ICD-10-CM | POA: Diagnosis present

## 2022-11-18 DIAGNOSIS — Z888 Allergy status to other drugs, medicaments and biological substances status: Secondary | ICD-10-CM | POA: Diagnosis not present

## 2022-11-18 DIAGNOSIS — K5080 Crohn's disease of both small and large intestine without complications: Secondary | ICD-10-CM

## 2022-11-18 DIAGNOSIS — K651 Peritoneal abscess: Secondary | ICD-10-CM | POA: Diagnosis not present

## 2022-11-18 DIAGNOSIS — Z79899 Other long term (current) drug therapy: Secondary | ICD-10-CM | POA: Diagnosis not present

## 2022-11-18 DIAGNOSIS — Z9011 Acquired absence of right breast and nipple: Secondary | ICD-10-CM | POA: Diagnosis not present

## 2022-11-18 DIAGNOSIS — K219 Gastro-esophageal reflux disease without esophagitis: Secondary | ICD-10-CM | POA: Diagnosis present

## 2022-11-18 DIAGNOSIS — K5792 Diverticulitis of intestine, part unspecified, without perforation or abscess without bleeding: Secondary | ICD-10-CM | POA: Diagnosis not present

## 2022-11-18 DIAGNOSIS — Z170 Estrogen receptor positive status [ER+]: Secondary | ICD-10-CM | POA: Diagnosis not present

## 2022-11-18 DIAGNOSIS — Z882 Allergy status to sulfonamides status: Secondary | ICD-10-CM | POA: Diagnosis not present

## 2022-11-18 DIAGNOSIS — Z86010 Personal history of colonic polyps: Secondary | ICD-10-CM | POA: Diagnosis not present

## 2022-11-18 DIAGNOSIS — Z5941 Food insecurity: Secondary | ICD-10-CM | POA: Diagnosis not present

## 2022-11-18 DIAGNOSIS — Z885 Allergy status to narcotic agent status: Secondary | ICD-10-CM | POA: Diagnosis not present

## 2022-11-18 DIAGNOSIS — R109 Unspecified abdominal pain: Secondary | ICD-10-CM | POA: Diagnosis present

## 2022-11-18 LAB — CBC
HCT: 28.5 % — ABNORMAL LOW (ref 36.0–46.0)
Hemoglobin: 9 g/dL — ABNORMAL LOW (ref 12.0–15.0)
MCH: 26.6 pg (ref 26.0–34.0)
MCHC: 31.6 g/dL (ref 30.0–36.0)
MCV: 84.3 fL (ref 80.0–100.0)
Platelets: 318 10*3/uL (ref 150–400)
RBC: 3.38 MIL/uL — ABNORMAL LOW (ref 3.87–5.11)
RDW: 14.2 % (ref 11.5–15.5)
WBC: 7.4 10*3/uL (ref 4.0–10.5)
nRBC: 0 % (ref 0.0–0.2)

## 2022-11-18 LAB — C-REACTIVE PROTEIN: CRP: 19.5 mg/dL — ABNORMAL HIGH (ref <1.0)

## 2022-11-18 LAB — COMPREHENSIVE METABOLIC PANEL
ALT: 12 U/L (ref 0–44)
AST: 13 U/L — ABNORMAL LOW (ref 15–41)
Albumin: 2.1 g/dL — ABNORMAL LOW (ref 3.5–5.0)
Alkaline Phosphatase: 67 U/L (ref 38–126)
Anion gap: 6 (ref 5–15)
BUN: <5 mg/dL — ABNORMAL LOW (ref 6–20)
CO2: 25 mmol/L (ref 22–32)
Calcium: 8 mg/dL — ABNORMAL LOW (ref 8.9–10.3)
Chloride: 104 mmol/L (ref 98–111)
Creatinine, Ser: 0.77 mg/dL (ref 0.44–1.00)
GFR, Estimated: >60 mL/min (ref >60)
Glucose, Bld: 94 mg/dL (ref 70–99)
Potassium: 3.6 mmol/L (ref 3.5–5.1)
Sodium: 135 mmol/L (ref 135–145)
Total Bilirubin: 0.5 mg/dL (ref 0.3–1.2)
Total Protein: 6.7 g/dL (ref 6.5–8.1)

## 2022-11-18 LAB — MAGNESIUM: Magnesium: 1.7 mg/dL (ref 1.7–2.4)

## 2022-11-18 LAB — PROCALCITONIN: Procalcitonin: 0.1 ng/mL

## 2022-11-18 MED ORDER — SODIUM CHLORIDE 0.9 % IV SOLN: INTRAVENOUS | Status: AC

## 2022-11-18 MED ORDER — AMOXICILLIN-POT CLAVULANATE 875-125 MG PO TABS
1.0000 | ORAL_TABLET | Freq: Two times a day (BID) | ORAL | Status: DC
  Administered 2022-11-18 – 2022-11-20 (×5): 1 via ORAL
  Filled 2022-11-18 (×5): qty 1

## 2022-11-18 MED ORDER — PANTOPRAZOLE SODIUM 40 MG IV SOLR
40.0000 mg | INTRAVENOUS | Status: DC
  Administered 2022-11-18 – 2022-11-19 (×2): 40 mg via INTRAVENOUS
  Filled 2022-11-18 (×2): qty 10
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
## 2022-11-18 NOTE — Progress Notes (Signed)
Subjective: Feeling better today, less pain.  Hungry and ready for something to eat or drink.    ROS: See above, otherwise other systems negative  Objective: Vital signs in last 24 hours: Temp:  [98.2 F (36.8 C)-99.1 F (37.3 C)] 98.6 F (37 C) (09/17 0000) Pulse Rate:  [95-110] 95 (09/17 0452) Resp:  [16-22] 20 (09/17 0452) BP: (98-112)/(62-75) 105/74 (09/17 0452) SpO2:  [97 %] 97 % (09/16 1814) Last BM Date : 11/18/22  Intake/Output from previous day: 09/16 0701 - 09/17 0700 In: 1542.3 [IV Piggyback:1542.3] Out: -  Intake/Output this shift: No intake/output data recorded.  PE: Gen: NAD Heart: regular, in 90s Abd: soft, less tender today in lower abdomen, ND, +BS  Lab Results:  Recent Labs    11/17/22 0845 11/18/22 0521  WBC 9.6 7.4  HGB 11.1* 9.0*  HCT 35.0* 28.5*  PLT 392 318   BMET Recent Labs    11/17/22 0845 11/18/22 0609  NA 135 135  K 3.7 3.6  CL 100 104  CO2 26 25  GLUCOSE 99 94  BUN 7 <5*  CREATININE 0.87 0.77  CALCIUM 8.7* 8.0*   PT/INR No results for input(s): "LABPROT", "INR" in the last 72 hours. CMP     Component Value Date/Time   NA 135 11/18/2022 0609   K 3.6 11/18/2022 0609   CL 104 11/18/2022 0609   CO2 25 11/18/2022 0609   GLUCOSE 94 11/18/2022 0609   BUN <5 (L) 11/18/2022 0609   CREATININE 0.77 11/18/2022 0609   CREATININE 0.64 10/25/2020 1534   CALCIUM 8.0 (L) 11/18/2022 0609   PROT 6.7 11/18/2022 0609   ALBUMIN 2.1 (L) 11/18/2022 0609   AST 13 (L) 11/18/2022 0609   ALT 12 11/18/2022 0609   ALKPHOS 67 11/18/2022 0609   BILITOT 0.5 11/18/2022 0609   GFRNONAA >60 11/18/2022 0609   GFRAA >60 02/29/2016 0426   Lipase     Component Value Date/Time   LIPASE 22 11/17/2022 0845       Studies/Results: DG Chest Port 1 View  Result Date: 11/18/2022 CLINICAL DATA:  Shortness of breath, abdominal pain EXAM: PORTABLE CHEST - 1 VIEW COMPARISON:  CT abdomen from previous day, and earlier exams FINDINGS: Lungs are  clear. Heart size and mediastinal contours are within normal limits. No effusion. Surgical clips right axilla. Free intraperitoneal air as noted on yesterday's CT. IMPRESSION: 1. No acute cardiopulmonary disease. 2. Free intraperitoneal air. Electronically Signed   By: Corlis Leak M.D.   On: 11/18/2022 07:58   MR ABDOMEN W WO CONTRAST  Result Date: 11/17/2022 CLINICAL DATA:  Suspected hepatic abscesses on same day CT with possible distal branch portal venous thrombosis EXAM: MRI ABDOMEN WITHOUT AND WITH CONTRAST TECHNIQUE: Multiplanar multisequence MR imaging of the abdomen was performed both before and after the administration of intravenous contrast. CONTRAST:  5mL GADAVIST GADOBUTROL 1 MMOL/ML IV SOLN COMPARISON:  Same day CT abdomen and pelvis and priors FINDINGS: Lower chest: No acute findings. Hepatobiliary: Heterogeneous parenchymal signal of the right hepatic lobe involving predominantly segment 7 and 8. Multifocal, peripherally enhancing T2 hyperintensities within segments 7 and 8 associated with diffusion restriction, for example 1.0 x 0.9 cm in segment 7 (12:41), which demonstrates branching pattern, and cluster of subcentimeter foci in the hepatic dome measuring 2.1 x 2.0 cm in conglomerate. Mild intrahepatic bile duct dilation. No common bile duct dilation. Normal gallbladder. Pancreas: No mass, inflammatory changes, or other parenchymal abnormality identified. Spleen: T2 hypointense splenic  lesions measuring 2.2 x 2.0 cm (12:40), previously 1.0 x 0.8 cm on 11/29/2020, and 1.1 x 1.1 cm (12:42), new compared to 11/29/2020, demonstrate gradual contrast enhancement on delayed phase images. No associated diffusion restriction. Adrenals/Urinary Tract: No adrenal nodules. No suspicious renal masses identified. No evidence of hydronephrosis. Left lower pole simple cyst. No specific follow-up imaging recommended. Stomach/Bowel: Visualized portions within the abdomen are unremarkable. Vascular/Lymphatic: No  pathologically enlarged lymph nodes identified. No abdominal aortic aneurysm demonstrated. Portal vein is patent. Other:  Small volume pneumoperitoneum is better seen on same day CT. Musculoskeletal: No suspicious bone lesions identified. IMPRESSION: 1. Multifocal, peripherally enhancing T2 hyperintensities within segments 7 and 8 associated with diffusion restriction, most consistent with hepatic abscesses. Portal vein is patent. 2. Heterogeneous parenchymal signal of the right hepatic lobe involving predominantly segment 7 and 8, may be reactive or reflect hepatitis. 3. Mild intrahepatic bile duct dilation. No common bile duct dilation. 4. T2 hypointense splenic lesions measuring up to 2.2 cm, increased in size compared to 11/29/2020, and new compared to 11/29/2020, demonstrate gradual contrast enhancement on delayed phase images. No associated diffusion restriction. Differential includes littoral cell angioma, fibrous tumor, and sclerosing angiomatoid nodular transformation (SANT). 5. Small volume pneumoperitoneum is better seen on same day CT. Electronically Signed   By: Agustin Cree M.D.   On: 11/17/2022 16:29   CT ABDOMEN PELVIS W CONTRAST  Result Date: 11/17/2022 CLINICAL DATA:  Acute onset abdominal pain EXAM: CT ABDOMEN AND PELVIS WITH CONTRAST TECHNIQUE: Multidetector CT imaging of the abdomen and pelvis was performed using the standard protocol following bolus administration of intravenous contrast. RADIATION DOSE REDUCTION: This exam was performed according to the departmental dose-optimization program which includes automated exposure control, adjustment of the mA and/or kV according to patient size and/or use of iterative reconstruction technique. CONTRAST:  75mL OMNIPAQUE IOHEXOL 350 MG/ML SOLN COMPARISON:  CT 11/29/2020 FINDINGS: Lower chest: There is some linear opacity lung bases likely scar or atelectasis. No pleural effusion. Hepatobiliary: Mild hepatomegaly. There are several ill-defined cystic  areas in the right hepatic lobe involving segments 7, 8. Example segment 8 centrally on series 3, image 10 measuring 12 mm. These new from previous. There appear to be some areas of biliary ectasia and some distal branches of the portal vein which are poorly enhancing. Gallbladder is distended. Pancreas: Unremarkable. No pancreatic ductal dilatation or surrounding inflammatory changes. Spleen: Increasing low-attenuation splenic lesions. There are 2 lesion seen. Larger on series 3, image 14 now measures 2.4 by 1.9 cm. These are not clearly cysts. Adrenals/Urinary Tract: Adrenal glands are preserved. Stable Bosniak 1 midportion left-sided renal cyst. No enhancing renal mass. No collecting system dilatation. Preserved contours of the urinary bladder. Stomach/Bowel: On this non oral contrast exam there is significant wall thickening with stranding along the sigmoid colon with diverticula. There is some extraluminal loculated fluid and air extending lateral with multiple small pockets. The area overall measures up to 5.4 by 3.1 cm on series 3, image 67. There is extensive associated adjacent stranding. There is scattered free air throughout the mesentery including up along the margin of the liver and beneath the right hemidiaphragm consistent with components of perforation. Large bowel otherwise is nondilated with scattered stool. The small bowel is nondilated with a few scattered air-fluid levels. The stomach is underdistended. Question some wall thickening along the distal stomach as seen previously but again the stomach is underdistended. Vascular/Lymphatic: Few prominent pelvic lymph nodes on the left which could be reactive including perirectal space  nodes. Mild vascular calcifications. Normal caliber aorta and IVC Reproductive: Uterus and bilateral adnexa are unremarkable. Other: There is bandlike soft tissue thickening along the left ischioanal fossa. Previously there was a fistula in this location. This could be  the sequela of previous fistula. No residual rim enhancing fluid collection or soft tissue gas. Please correlate for symptoms. Musculoskeletal: Stable sclerosis of the right sacroiliac joint. Scattered degenerative changes otherwise. IMPRESSION: Evidence of sigmoid colon diverticulitis with wall thickening, stranding. However there is also some extraluminal loculated areas of fluid along the left side consistent with the areas of potential abscess. And there is also presence of scattered free air. Component of perforation. Free air extends throughout the abdomen including along the umbilicus and adjacent to the diaphragm. No component of obstruction. New from previous are some heterogeneous cystic like areas scattered in the right hepatic lobe . Based on appearance favor these being small abscesses. There is also a potential component of some distal branch portal vein areas of thrombosis. These could be evaluated further with pre and postcontrast dynamic MRI when clinically appropriate and as necessary. New solid-appearing splenic lesions. Additional workup when appropriate. Changes of healing anal fistula. Stable wall thickening of the distal stomach. Critical Value/emergent results were called by telephone at the time of interpretation on 11/17/2022 at 7:52 am to provider [facility] , who verbally acknowledged these results. Electronically Signed   By: Karen Kays M.D.   On: 11/17/2022 10:53    Anti-infectives: Anti-infectives (From admission, onward)    Start     Dose/Rate Route Frequency Ordered Stop   11/17/22 1800  piperacillin-tazobactam (ZOSYN) IVPB 3.375 g        3.375 g 12.5 mL/hr over 240 Minutes Intravenous Every 8 hours 11/17/22 1313 11/24/22 2159   11/17/22 1100  piperacillin-tazobactam (ZOSYN) IVPB 3.375 g        3.375 g 100 mL/hr over 30 Minutes Intravenous  Once 11/17/22 1053 11/17/22 1310        Assessment/Plan Diverticulitis with perforation, possible small hepatic abscesses   -continues to improve -cont IV abx therapy for now -will allow CLD today -agree with ID consult to recommend abx and duration given small hepatic abscesses.  Spoke with them at bedside as well. -d/w GI at bedside.  Would likely like and updated c-scope since last one was in 2022.  Given she has had 2 episodes now of some type of infectious colonic process causing hepatic abscesses, it would likely be beneficial for her to follow up after c-scope with one of our colorectal surgeons.  Her c-scope 2 years ago appears more c/w IBD, but her imaging now seems more c/w diverticulitis.  Either way, continue conservative now and more follow up as an outpatient. -d/w POC with primary team  FEN - CLD/IVFs VTE - heparin ID - zosyn   Possible portal vein thrombosis - MRI negative for thrombus.  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  Followed by Dr. Al Pimple, Dr. Roselind Messier, and Dr. Donell Beers.  She will see Dr. Al Pimple in 1-2 weeks after discharge. New solid-appearing splenic lesions - MRI T2 hypointense splenic lesions measuring up to 2.2 cm, increased in size compared to 11/29/2020, and new compared to 11/29/2020, demonstrate gradual contrast enhancement on delayed phase images. No associated diffusion restriction. Differential includes littoral cell angioma, fibrous tumor, and sclerosing angiomatoid nodular transformation (SANT).  I have relayed this to Dr. Al Pimple as well.   IBD - needs to follow up with GI as  outpatient for colonoscopy as outlined above GERD Tobacco abuse - only smokes during stressful times Anemia  I reviewed Consultant GI notes, hospitalist notes, last 24 h vitals and pain scores, last 48 h intake and output, last 24 h labs and trends, and last 24 h imaging results.   LOS: 0 days    Letha Cape , [facility] 11/18/2022, 11:31 AM Please see Amion for pager number during day hours 7:00am-4:30pm or 7:00am -11:30am on weekends

## 2022-11-18 NOTE — Consult Note (Signed)
[facility]    Date of Admission:  11/17/2022     Reason for Consult: hepatic abscesses/diverticulitis    Referring Provider: Thedore Mins      Abx: piptazo        Assessment: 52 yo female with hx dcis s/p lumpectomy and undergoing local radiation therapy, hx IBD not on treatment and no follow up for past 2 years, hx hepatic abscesses a couple years prior, admitted with left lower abd pain found to have diverticultis and hepatic abscesses  Surgery following. No urgent surgical management needed. No obvious large abscess intraabd  Ct revewed -- very small abscesses in liver. Unclear significance of the splenic lesion but agree needing w/u in setting breast cancer  9/16 bcx ngtd  Per patient last year colonoscopy didn't see any malignant lesion, but will need gi outpatient f/u for ongoing management of the ibd    Plan: For now on limited oral intake (patient hungry wants to eat) per surgery discretion Will continue iv piptazo for now and when taking good PO can transition to amox-clav 875 mg po bid Plan 6 weeks oral abx and repeat abd pelv ct in around 3-4 weeks in clinic ID f/u with me on 10/4 @ 945 am Will sign off Discussed with primary team      ------------------------------------------------ Principal Problem:   Intra-abdominal abscess (HCC) Active Problems:   GERD (gastroesophageal reflux disease)   Anemia of chronic disease   IBD (inflammatory bowel disease)   Liver abscess   Diverticulitis   Malignant neoplasm of upper-outer quadrant of right breast in female, estrogen receptor positive (HCC)    HPI: Nicole Browning is a 52 y.o. female hx dcis s/p lumpectomy and undergoing local radiation therapy, hx IBD not on treatment and no follow up for past 2 years, hx hepatic abscesses a couple years prior, admitted with left lower abd pain found to have diverticultis and hepatic abscesses  Patient has a few days of left abd pain No  fever, chill, n/v/diarrhea  Came in on 9/16 Afebrile No leukocytosis Abd ct mentioned diverticulitis and hepatic abscesses  Surgery following no acute surgical indication  Has hx IBD not treated and no f/u 2 years.   Hx hepatic abscesses previously unclear reason Per her report last year had colonoscopy was "normal"  Splenic lesion on ct/mri and was present previously; growing  Family History  Problem Relation Age of Onset   Diabetes Mother    COPD Father    Leukemia Brother    Colon cancer Neg Hx    Colon polyps Neg Hx    Esophageal cancer Neg Hx    Rectal cancer Neg Hx    Stomach cancer Neg Hx     Social History   Tobacco Use   Smoking status: Every Day    Current packs/day: 0.33    Average packs/day: 0.3 packs/day for 28.0 years (9.2 ttl pk-yrs)    Types: Cigarettes   Smokeless tobacco: Never  Vaping Use   Vaping status: Never Used  Substance Use Topics   Alcohol use: Yes    Alcohol/week: 2.0 standard drinks of alcohol    Types: 2 Glasses of wine per week   Drug use: No    Allergies  Allergen Reactions   Bactrim [Sulfamethoxazole-Trimethoprim] Hives    Immediate body wide hives   Percocet [Oxycodone-Acetaminophen] Nausea Only   Valium [Diazepam] Nausea Only    Review of Systems: ROS All Other ROS was negative,  except mentioned above   Past Medical History:  Diagnosis Date   AKI (acute kidney injury) (HCC) 10/03/2020   Anemia    Diverticulitis 12/13/2020   GERD (gastroesophageal reflux disease)    IBS (irritable bowel syndrome)    Perirectal abscess    Protein-calorie malnutrition (HCC) 02/29/2016   Tobacco use    UC (ulcerative colitis) (HCC)        Scheduled Meds:  amoxicillin-clavulanate  1 tablet Oral Q12H   gabapentin  100 mg Oral TID   heparin  5,000 Units Subcutaneous Q8H   pantoprazole (PROTONIX) IV  40 mg Intravenous Q24H   sodium chloride flush  3 mL Intravenous Q12H   Continuous Infusions:  sodium chloride 50 mL/hr at  11/18/22 0605   PRN Meds:.acetaminophen **OR** acetaminophen, bismuth subsalicylate, HYDROmorphone (DILAUDID) injection, polyethylene glycol, zolpidem   OBJECTIVE: Blood pressure 105/74, pulse 95, temperature 98.6 F (37 C), resp. rate 20, height 5' 4.5" (1.638 m), weight 56.7 kg, last menstrual period 10/11/2022, SpO2 97%.  Physical Exam  General/constitutional: no distress, pleasant HEENT: Normocephalic, PER, Conj Clear, EOMI, Oropharynx clear Neck supple CV: rrr no mrg Lungs: clear to auscultation, normal respiratory effort Abd: Soft, Nontender Ext: no edema Skin: No Rash Neuro: nonfocal MSK: no peripheral joint swelling/tenderness/warmth; back spines nontender    Lab Results Lab Results  Component Value Date   WBC 7.4 11/18/2022   HGB 9.0 (L) 11/18/2022   HCT 28.5 (L) 11/18/2022   MCV 84.3 11/18/2022   PLT 318 11/18/2022    Lab Results  Component Value Date   CREATININE 0.77 11/18/2022   BUN <5 (L) 11/18/2022   NA 135 11/18/2022   K 3.6 11/18/2022   CL 104 11/18/2022   CO2 25 11/18/2022    Lab Results  Component Value Date   ALT 12 11/18/2022   AST 13 (L) 11/18/2022   ALKPHOS 67 11/18/2022   BILITOT 0.5 11/18/2022      Microbiology: Recent Results (from the past 240 hour(s))  Culture, blood (routine x 2)     Status: None (Preliminary result)   Collection Time: 11/17/22 11:37 AM   Specimen: BLOOD  Result Value Ref Range Status   Specimen Description BLOOD BLOOD RIGHT ARM  Final   Special Requests   Final    BOTTLES DRAWN AEROBIC AND ANAEROBIC Blood Culture adequate volume   Culture   Final    NO GROWTH < 24 HOURS Performed at [facility], [address], [place], [place] [address]    Report Status PENDING  Incomplete  Culture, blood (routine x 2)     Status: None (Preliminary result)   Collection Time: 11/17/22 11:37 AM   Specimen: BLOOD  Result Value Ref Range Status   Specimen Description BLOOD BLOOD RIGHT ARM  Final   Special Requests    Final    BOTTLES DRAWN AEROBIC AND ANAEROBIC Blood Culture adequate volume   Culture   Final    NO GROWTH < 24 HOURS Performed at [facility], [address], [place], [place] [address]    Report Status PENDING  Incomplete     Serology:    Imaging: If present, new imagings (plain films, ct scans, and mri) have been personally visualized and interpreted; radiology reports have been reviewed. Decision making incorporated into the Impression / Recommendations.  9/16 ct abd pelv Evidence of sigmoid colon diverticulitis with wall thickening, stranding. However there is also some extraluminal loculated areas of fluid along the left side consistent with  the areas of potential abscess. And there is also presence of scattered free air. Component of perforation. Free air extends throughout the abdomen including along the umbilicus and adjacent to the diaphragm. No component of obstruction.   New from previous are some heterogeneous cystic like areas scattered in the right hepatic lobe. Based on appearance favor these being small abscesses. There is also a potential component of some distal branch portal vein areas of thrombosis. These could be evaluated further with pre and postcontrast dynamic MRI when clinically appropriate and as necessary.   New solid-appearing splenic lesions. Additional workup when appropriate.   Changes of healing anal fistula.   Stable wall thickening of the distal stomach.   9/16 abd mri 1. Multifocal, peripherally enhancing T2 hyperintensities within segments 7 and 8 associated with diffusion restriction, most consistent with hepatic abscesses. Portal vein is patent. 2. Heterogeneous parenchymal signal of the right hepatic lobe involving predominantly segment 7 and 8, may be reactive or reflect hepatitis. 3. Mild intrahepatic bile duct dilation. No common bile duct dilation. 4. T2 hypointense splenic lesions measuring up to 2.2 cm, increased in size  compared to 11/29/2020, and new compared to 11/29/2020, demonstrate gradual contrast enhancement on delayed phase images. No associated diffusion restriction. Differential includes littoral cell angioma, fibrous tumor, and sclerosing angiomatoid nodular transformation (SANT). 5. Small volume pneumoperitoneum is better seen on same day CT.  Raymondo Band, MD [facility] [phone] pager    11/18/2022, 2:58 PM

## 2022-11-18 NOTE — Progress Notes (Signed)
PROGRESS NOTE                                                                                                                                                                                                             Patient Demographics:    Nicole Browning, is a 52 y.o. female, DOB - April 07, 1970, XBM:[id number]  Outpatient Primary MD for the patient is Grayce Sessions, NP    LOS - 0  Admit date - 11/17/2022    Chief Complaint  Patient presents with   Abdominal Pain       Brief Narrative (HPI from H&P)    52 y.o. female with medical history significant of GERD, anemia, breast cancer, diverticulitis, IBD presenting with abdominal pain for the last 2 to 3 days, pain is diffuse but mostly on the left side of her abdomen, came to the ER where workup suggestive of hepatic abscess, small pneumoperitoneum and nonspecific findings on spleen.  Admitted for further care.    Subjective:    Nicole Browning today has, No headache, No chest pain, Improved abdominal pain - No Nausea, No new weakness tingling or numbness, no SOB   Assessment  & Plan :   Sigmoid diverticulitis with CT findings consistent of potential hepatic abscesses, nonspecific findings on spleen as well. she is currently being treated conservatively with bowel rest, IV fluids, empiric IV antibiotics, general surgery following, have also involved [facility] and ID to provide their opinion.  Overall improving clinically.  Nonspecific spleen findings on MRI.  Defer management to GI.  GERD.  PPI.  AOCD - monitor.  HX of breast cancer.  Status post recent lumpectomy, currently undergoing outpatient radiation.  Post discharge follow-up with her oncologist.      Condition - Fair  Family Communication  :  None  Code Status :  Full  Consults  :  CCS, ID, GI  PUD Prophylaxis :     Procedures  :     CT -  Evidence of sigmoid colon diverticulitis with wall  thickening, stranding. However there is also some extraluminal loculated areas of fluid along the left side consistent with the areas of potential abscess. And there is also presence of scattered free air. Component of perforation. Free air extends throughout the abdomen including along the umbilicus and adjacent to the diaphragm. No component of  obstruction. New from previous are some heterogeneous cystic like areas scattered in the right hepatic lobe . Based on appearance favor these being small abscesses. There is also a potential component of some distal branch portal vein areas of thrombosis. These could be evaluated further with pre and postcontrast dynamic MRI when clinically appropriate and as necessary. New solid-appearing splenic lesions. Additional workup when appropriate. Changes of healing anal fistula. Stable wall thickening of the distal stomach.   MRI -  1. Multifocal, peripherally enhancing T2 hyperintensities within segments 7 and 8 associated with diffusion restriction, most consistent with hepatic abscesses. Portal vein is patent. 2. Heterogeneous parenchymal signal of the right hepatic lobe involving predominantly segment 7 and 8, may be reactive or reflect hepatitis. 3. Mild intrahepatic bile duct dilation. No common bile duct dilation. 4. T2 hypointense splenic lesions measuring up to 2.2 cm, increased in size compared to 11/29/2020, and new compared to 11/29/2020, demonstrate gradual contrast enhancement on delayed phase images. No associated diffusion restriction. Differential includes littoral cell angioma, fibrous tumor, and sclerosing angiomatoid nodular transformation (SANT). 5. Small volume pneumoperitoneum is better seen on same day CT.      Disposition Plan  :    Status is: Inpatient  DVT Prophylaxis  :    heparin injection 5,000 Units Start: 11/17/22 1400    Lab Results  Component Value Date   PLT 318 11/18/2022    Diet :  Diet Order             Diet NPO time  specified Except for: Ice Chips, Sips with Meds  Diet effective now                    Inpatient Medications  Scheduled Meds:  gabapentin  100 mg Oral TID   heparin  5,000 Units Subcutaneous Q8H   sodium chloride flush  3 mL Intravenous Q12H   Continuous Infusions:  sodium chloride 50 mL/hr at 11/18/22 0605   piperacillin-tazobactam (ZOSYN)  IV 3.375 g (11/18/22 0642)   PRN Meds:.acetaminophen **OR** acetaminophen, bismuth subsalicylate, HYDROmorphone (DILAUDID) injection, polyethylene glycol, zolpidem  Antibiotics  :    Anti-infectives (From admission, onward)    Start     Dose/Rate Route Frequency Ordered Stop   11/17/22 1800  piperacillin-tazobactam (ZOSYN) IVPB 3.375 g        3.375 g 12.5 mL/hr over 240 Minutes Intravenous Every 8 hours 11/17/22 1313     11/17/22 1100  piperacillin-tazobactam (ZOSYN) IVPB 3.375 g        3.375 g 100 mL/hr over 30 Minutes Intravenous  Once 11/17/22 1053 11/17/22 1310         Objective:   Vitals:   11/17/22 2000 11/18/22 0000 11/18/22 0400 11/18/22 0452  BP: 103/67 104/62 98/65 105/74  Pulse: (!) 110 (!) 107 98 95  Resp: 18 19 16 20   Temp:  98.6 F (37 C)    TempSrc:      SpO2:      Weight:      Height:        Wt Readings from Last 3 Encounters:  11/17/22 56.7 kg  11/03/22 56.7 kg  09/10/22 55 kg     Intake/Output Summary (Last 24 hours) at 11/18/2022 0925 Last data filed at 11/17/2022 1310 Gross per 24 hour  Intake 1542.28 ml  Output --  Net 1542.28 ml     Physical Exam  Awake Alert, No new F.N deficits, Normal affect [place].AT,PERRAL Supple Neck, No JVD,   Symmetrical Chest  wall movement, Good air movement bilaterally, CTAB RRR,No Gallops,Rubs or new Murmurs,  +ve B.Sounds, Abd Soft, No tenderness,   No Cyanosis, Clubbing or edema        Data Review:    Recent Labs  Lab 11/17/22 0845 11/18/22 0521  WBC 9.6 7.4  HGB 11.1* 9.0*  HCT 35.0* 28.5*  PLT 392 318  MCV 85.8 84.3  MCH 27.2 26.6  MCHC  31.7 31.6  RDW 14.3 14.2    Recent Labs  Lab 11/17/22 0845 11/17/22 1153 11/18/22 0609  NA 135  --  135  K 3.7  --  3.6  CL 100  --  104  CO2 26  --  25  ANIONGAP 9  --  6  GLUCOSE 99  --  94  BUN 7  --  <5*  CREATININE 0.87  --  0.77  AST 16  --  13*  ALT 13  --  12  ALKPHOS 80  --  67  BILITOT 0.5  --  0.5  ALBUMIN 2.6*  --  2.1*  CRP  --   --  19.5*  PROCALCITON  --   --  0.10  LATICACIDVEN  --  0.8  --   MG  --   --  1.7  CALCIUM 8.7*  --  8.0*      Recent Labs  Lab 11/17/22 0845 11/17/22 1153 11/18/22 0609  CRP  --   --  19.5*  PROCALCITON  --   --  0.10  LATICACIDVEN  --  0.8  --   MG  --   --  1.7  CALCIUM 8.7*  --  8.0*      Lab Results  Component Value Date   HGBA1C 5.5 04/04/2020     Micro Results Recent Results (from the past 240 hour(s))  Culture, blood (routine x 2)     Status: None (Preliminary result)   Collection Time: 11/17/22 11:37 AM   Specimen: BLOOD  Result Value Ref Range Status   Specimen Description BLOOD BLOOD RIGHT ARM  Final   Special Requests   Final    BOTTLES DRAWN AEROBIC AND ANAEROBIC Blood Culture adequate volume   Culture   Final    NO GROWTH < 24 HOURS Performed at [facility], [address], [place], [place] [address]    Report Status PENDING  Incomplete  Culture, blood (routine x 2)     Status: None (Preliminary result)   Collection Time: 11/17/22 11:37 AM   Specimen: BLOOD  Result Value Ref Range Status   Specimen Description BLOOD BLOOD RIGHT ARM  Final   Special Requests   Final    BOTTLES DRAWN AEROBIC AND ANAEROBIC Blood Culture adequate volume   Culture   Final    NO GROWTH < 24 HOURS Performed at [facility], [address] [place], [place] [address]    Report Status PENDING  Incomplete    Radiology Reports DG Chest Port 1 View  Result Date: 11/18/2022 CLINICAL DATA:  Shortness of breath, abdominal pain EXAM: PORTABLE CHEST - 1 VIEW COMPARISON:  CT abdomen from previous day, and  earlier exams FINDINGS: Lungs are clear. Heart size and mediastinal contours are within normal limits. No effusion. Surgical clips right axilla. Free intraperitoneal air as noted on yesterday's CT. IMPRESSION: 1. No acute cardiopulmonary disease. 2. Free intraperitoneal air. Electronically Signed   By: Corlis Leak M.D.   On: 11/18/2022 07:58   MR ABDOMEN W WO CONTRAST  Result Date: 11/17/2022 CLINICAL DATA:  Suspected hepatic abscesses on same day CT with possible distal branch portal venous thrombosis EXAM: MRI ABDOMEN WITHOUT AND WITH CONTRAST TECHNIQUE: Multiplanar multisequence MR imaging of the abdomen was performed both before and after the administration of intravenous contrast. CONTRAST:  5mL GADAVIST GADOBUTROL 1 MMOL/ML IV SOLN COMPARISON:  Same day CT abdomen and pelvis and priors FINDINGS: Lower chest: No acute findings. Hepatobiliary: Heterogeneous parenchymal signal of the right hepatic lobe involving predominantly segment 7 and 8. Multifocal, peripherally enhancing T2 hyperintensities within segments 7 and 8 associated with diffusion restriction, for example 1.0 x 0.9 cm in segment 7 (12:41), which demonstrates branching pattern, and cluster of subcentimeter foci in the hepatic dome measuring 2.1 x 2.0 cm in conglomerate. Mild intrahepatic bile duct dilation. No common bile duct dilation. Normal gallbladder. Pancreas: No mass, inflammatory changes, or other parenchymal abnormality identified. Spleen: T2 hypointense splenic lesions measuring 2.2 x 2.0 cm (12:40), previously 1.0 x 0.8 cm on 11/29/2020, and 1.1 x 1.1 cm (12:42), new compared to 11/29/2020, demonstrate gradual contrast enhancement on delayed phase images. No associated diffusion restriction. Adrenals/Urinary Tract: No adrenal nodules. No suspicious renal masses identified. No evidence of hydronephrosis. Left lower pole simple cyst. No specific follow-up imaging recommended. Stomach/Bowel: Visualized portions within the abdomen are  unremarkable. Vascular/Lymphatic: No pathologically enlarged lymph nodes identified. No abdominal aortic aneurysm demonstrated. Portal vein is patent. Other:  Small volume pneumoperitoneum is better seen on same day CT. Musculoskeletal: No suspicious bone lesions identified. IMPRESSION: 1. Multifocal, peripherally enhancing T2 hyperintensities within segments 7 and 8 associated with diffusion restriction, most consistent with hepatic abscesses. Portal vein is patent. 2. Heterogeneous parenchymal signal of the right hepatic lobe involving predominantly segment 7 and 8, may be reactive or reflect hepatitis. 3. Mild intrahepatic bile duct dilation. No common bile duct dilation. 4. T2 hypointense splenic lesions measuring up to 2.2 cm, increased in size compared to 11/29/2020, and new compared to 11/29/2020, demonstrate gradual contrast enhancement on delayed phase images. No associated diffusion restriction. Differential includes littoral cell angioma, fibrous tumor, and sclerosing angiomatoid nodular transformation (SANT). 5. Small volume pneumoperitoneum is better seen on same day CT. Electronically Signed   By: Agustin Cree M.D.   On: 11/17/2022 16:29   CT ABDOMEN PELVIS W CONTRAST  Result Date: 11/17/2022 CLINICAL DATA:  Acute onset abdominal pain EXAM: CT ABDOMEN AND PELVIS WITH CONTRAST TECHNIQUE: Multidetector CT imaging of the abdomen and pelvis was performed using the standard protocol following bolus administration of intravenous contrast. RADIATION DOSE REDUCTION: This exam was performed according to the departmental dose-optimization program which includes automated exposure control, adjustment of the mA and/or kV according to patient size and/or use of iterative reconstruction technique. CONTRAST:  75mL OMNIPAQUE IOHEXOL 350 MG/ML SOLN COMPARISON:  CT 11/29/2020 FINDINGS: Lower chest: There is some linear opacity lung bases likely scar or atelectasis. No pleural effusion. Hepatobiliary: Mild hepatomegaly.  There are several ill-defined cystic areas in the right hepatic lobe involving segments 7, 8. Example segment 8 centrally on series 3, image 10 measuring 12 mm. These new from previous. There appear to be some areas of biliary ectasia and some distal branches of the portal vein which are poorly enhancing. Gallbladder is distended. Pancreas: Unremarkable. No pancreatic ductal dilatation or surrounding inflammatory changes. Spleen: Increasing low-attenuation splenic lesions. There are 2 lesion seen. Larger on series 3, image 14 now measures 2.4 by 1.9 cm. These are not clearly cysts. Adrenals/Urinary Tract: Adrenal glands are preserved. Stable Bosniak  1 midportion left-sided renal cyst. No enhancing renal mass. No collecting system dilatation. Preserved contours of the urinary bladder. Stomach/Bowel: On this non oral contrast exam there is significant wall thickening with stranding along the sigmoid colon with diverticula. There is some extraluminal loculated fluid and air extending lateral with multiple small pockets. The area overall measures up to 5.4 by 3.1 cm on series 3, image 67. There is extensive associated adjacent stranding. There is scattered free air throughout the mesentery including up along the margin of the liver and beneath the right hemidiaphragm consistent with components of perforation. Large bowel otherwise is nondilated with scattered stool. The small bowel is nondilated with a few scattered air-fluid levels. The stomach is underdistended. Question some wall thickening along the distal stomach as seen previously but again the stomach is underdistended. Vascular/Lymphatic: Few prominent pelvic lymph nodes on the left which could be reactive including perirectal space nodes. Mild vascular calcifications. Normal caliber aorta and IVC Reproductive: Uterus and bilateral adnexa are unremarkable. Other: There is bandlike soft tissue thickening along the left ischioanal fossa. Previously there was a  fistula in this location. This could be the sequela of previous fistula. No residual rim enhancing fluid collection or soft tissue gas. Please correlate for symptoms. Musculoskeletal: Stable sclerosis of the right sacroiliac joint. Scattered degenerative changes otherwise. IMPRESSION: Evidence of sigmoid colon diverticulitis with wall thickening, stranding. However there is also some extraluminal loculated areas of fluid along the left side consistent with the areas of potential abscess. And there is also presence of scattered free air. Component of perforation. Free air extends throughout the abdomen including along the umbilicus and adjacent to the diaphragm. No component of obstruction. New from previous are some heterogeneous cystic like areas scattered in the right hepatic lobe . Based on appearance favor these being small abscesses. There is also a potential component of some distal branch portal vein areas of thrombosis. These could be evaluated further with pre and postcontrast dynamic MRI when clinically appropriate and as necessary. New solid-appearing splenic lesions. Additional workup when appropriate. Changes of healing anal fistula. Stable wall thickening of the distal stomach. Critical Value/emergent results were called by telephone at the time of interpretation on 11/17/2022 at 7:52 am to provider [facility] , who verbally acknowledged these results. Electronically Signed   By: Karen Kays M.D.   On: 11/17/2022 10:53      Signature  -   Susa Raring M.D on 11/18/2022 at 9:25 AM   -  To page go to [email or web address]

## 2022-11-18 NOTE — Plan of Care (Signed)
Problem: Education: Goal: Knowledge of General Education information will improve Description: Including pain rating scale, medication(s)/side effects and non-pharmacologic comfort measures Outcome: Progressing   Problem: Health Behavior/Discharge Planning: Goal: Ability to manage health-related needs will improve Outcome: Progressing   Problem: Clinical Measurements: Goal: Ability to maintain clinical measurements within normal limits will improve Outcome: Progressing Goal: Will remain free from infection Outcome: Progressing Goal: Diagnostic test results will improve Outcome: Progressing Goal: Respiratory complications will improve Outcome: Progressing Goal: Cardiovascular complication will be avoided Outcome: Progressing   Problem: Nutrition: Goal: Adequate nutrition will be maintained Outcome: Progressing   Problem: Activity: Goal: Risk for activity intolerance will decrease Outcome: Progressing   Problem: Elimination: Goal: Will not experience complications related to bowel motility Outcome: Progressing Goal: Will not experience complications related to urinary retention Outcome: Progressing

## 2022-11-19 ENCOUNTER — Ambulatory Visit: Payer: Medicaid Other

## 2022-11-19 ENCOUNTER — Telehealth: Payer: Self-pay | Admitting: Hematology and Oncology

## 2022-11-19 DIAGNOSIS — K651 Peritoneal abscess: Secondary | ICD-10-CM | POA: Diagnosis not present

## 2022-11-19 MED ORDER — PANTOPRAZOLE SODIUM 40 MG PO TBEC
40.0000 mg | DELAYED_RELEASE_TABLET | Freq: Every day | ORAL | Status: DC
  Administered 2022-11-20: 40 mg via ORAL
  Filled 2022-11-19: qty 1

## 2022-11-19 MED ORDER — ENSURE ENLIVE PO LIQD
237.0000 mL | Freq: Two times a day (BID) | ORAL | Status: DC
  Administered 2022-11-19 – 2022-11-20 (×2): 237 mL via ORAL

## 2022-11-19 NOTE — Plan of Care (Signed)
Pt has rested quietly throughout the night with no distress noted. Alert and oriented. On room air. No nausea/vomiting. Up ad lib to BR. Medicated twice for pain with relief noted. Medicated for sleep with relief noted. No complaints voiced.     Problem: Education: Goal: Knowledge of General Education information will improve Description: Including pain rating scale, medication(s)/side effects and non-pharmacologic comfort measures Outcome: Progressing   Problem: Health Behavior/Discharge Planning: Goal: Ability to manage health-related needs will improve Outcome: Progressing   Problem: Clinical Measurements: Goal: Ability to maintain clinical measurements within normal limits will improve Outcome: Progressing Goal: Will remain free from infection Outcome: Progressing Goal: Diagnostic test results will improve Outcome: Progressing Goal: Respiratory complications will improve Outcome: Progressing Goal: Cardiovascular complication will be avoided Outcome: Progressing   Problem: Activity: Goal: Risk for activity intolerance will decrease Outcome: Progressing   Problem: Nutrition: Goal: Adequate nutrition will be maintained Outcome: Progressing   Problem: Coping: Goal: Level of anxiety will decrease Outcome: Progressing   Problem: Elimination: Goal: Will not experience complications related to bowel motility Outcome: Progressing Goal: Will not experience complications related to urinary retention Outcome: Progressing   Problem: Pain Managment: Goal: General experience of comfort will improve Outcome: Progressing   Problem: Safety: Goal: Ability to remain free from injury will improve Outcome: Progressing   Problem: Skin Integrity: Goal: Risk for impaired skin integrity will decrease Outcome: Progressing

## 2022-11-19 NOTE — Progress Notes (Signed)
Patient ID: Nicole Browning, female   DOB: [birth year], 52 y.o.   MRN: [id number]   Acute Care Surgery Service Progress Note:    Chief Complaint/Subjective: Reports BM Reports tolerated clears No n/v Still with some abd pain  Objective: Vital signs in last 24 hours: Temp:  [98.3 F (36.8 C)-98.5 F (36.9 C)] 98.3 F (36.8 C) (09/18 0338) Pulse Rate:  [92-100] 92 (09/18 0338) Resp:  [16-17] 16 (09/18 0338) BP: (109-115)/(70-80) 115/75 (09/18 0338) SpO2:  [97 %] 97 % (09/17 1952) Last BM Date : 11/18/22  Intake/Output from previous day: 09/17 0701 - 09/18 0700 In: 1469.7 [P.O.:480; I.V.:989.7] Out: -  Intake/Output this shift: No intake/output data recorded.  Lungs: cta, nonlabored  Cardiovascular: reg  Abd: soft, some distension, mild LLQ TTP; no rebound  Extremities: no edema, +SCDs  Neuro: alert, nonfocal  Lab Results: CBC  Recent Labs    11/18/22 0521 11/19/22 0331  WBC 7.4 6.2  HGB 9.0* 8.8*  HCT 28.5* 27.2*  PLT 318 335   BMET Recent Labs    11/18/22 0609 11/19/22 0331  NA 135 136  K 3.6 3.6  CL 104 101  CO2 25 25  GLUCOSE 94 100*  BUN <5* <5*  CREATININE 0.77 0.70  CALCIUM 8.0* 8.1*   LFT    Latest Ref Rng & Units 11/19/2022    3:31 AM 11/18/2022    6:09 AM 11/17/2022    8:45 AM  Hepatic Function  Total Protein 6.5 - 8.1 g/dL 6.5  6.7  8.3   Albumin 3.5 - 5.0 g/dL 2.0  2.1  2.6   AST 15 - 41 U/L 13  13  16    ALT 0 - 44 U/L 13  12  13    Alk Phosphatase 38 - 126 U/L 69  67  80   Total Bilirubin 0.3 - 1.2 mg/dL 0.4  0.5  0.5    PT/INR No results for input(s): "LABPROT", "INR" in the last 72 hours. ABG No results for input(s): "PHART", "HCO3" in the last 72 hours.  Invalid input(s): "PCO2", "PO2"  Studies/Results:  Anti-infectives: Anti-infectives (From admission, onward)    Start     Dose/Rate Route Frequency Ordered Stop   11/18/22 1300  amoxicillin-clavulanate (AUGMENTIN) 875-125 MG per tablet 1 tablet        1 tablet Oral  Every 12 hours 11/18/22 1200     11/17/22 1800  piperacillin-tazobactam (ZOSYN) IVPB 3.375 g  Status:  Discontinued        3.375 g 12.5 mL/hr over 240 Minutes Intravenous Every 8 hours 11/17/22 1313 11/18/22 1200   11/17/22 1100  piperacillin-tazobactam (ZOSYN) IVPB 3.375 g        3.375 g 100 mL/hr over 30 Minutes Intravenous  Once 11/17/22 1053 11/17/22 1310       Medications: Scheduled Meds:  amoxicillin-clavulanate  1 tablet Oral Q12H   gabapentin  100 mg Oral TID   heparin  5,000 Units Subcutaneous Q8H   pantoprazole (PROTONIX) IV  40 mg Intravenous Q24H   sodium chloride flush  3 mL Intravenous Q12H   Continuous Infusions:  sodium chloride 50 mL/hr at 11/19/22 0635   PRN Meds:.acetaminophen **OR** acetaminophen, bismuth subsalicylate, HYDROmorphone (DILAUDID) injection, polyethylene glycol, zolpidem  Assessment/Plan: Patient Active Problem List   Diagnosis Date Noted   Malignant neoplasm of upper-outer quadrant of right breast in female, estrogen receptor positive (HCC) 07/01/2022   Diverticulitis 12/13/2020   Smoking 11/22/2020   Liver abscess 10/17/2020   Medication monitoring encounter 10/17/2020  Streptococcal bacteremia    Intra-abdominal abscess (HCC) 10/03/2020   IDA (iron deficiency anemia) 07/24/2020   IBD (inflammatory bowel disease) 07/24/2020   Protein-calorie malnutrition (HCC) 02/29/2016   Elevated hemoglobin A1c 02/27/2016   GERD (gastroesophageal reflux disease)    Tobacco use    Anemia of chronic disease    Prediabetes    Perirectal abscess 02/24/2016   Diverticulitis with perforation, possible small hepatic abscesses  -continues to improve -cont IV abx therapy for now -will allow FLD today -agree with ID consult - rec zosyn and then when taking good PO can transition to amox-clav 875 mg po bid Plan 6 weeks oral abx and repeat abd pelv ct in around 3-4 weeks in clinic ID f/u with me on 10/4 @ 945 am -d/w GI at bedside.  Would likely like and  updated c-scope since last one was in 2022.  Given she has had 2 episodes now of some type of infectious colonic process causing hepatic abscesses, it would likely be beneficial for her to follow up after c-scope with one of our colorectal surgeons.  Her c-scope 2 years ago appears more c/w IBD, but her imaging now seems more c/w diverticulitis.  Either way, continue conservative now and more follow up as an outpatient. -d/w POC with primary team   FEN - FLD/IVFs VTE - heparin ID - zosyn   Possible portal vein thrombosis - MRI negative for thrombus.  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  Followed by Dr. Al Pimple, Dr. Roselind Messier, and Dr. Donell Beers.  She will see Dr. Al Pimple in 1-2 weeks after discharge. New solid-appearing splenic lesions - MRI T2 hypointense splenic lesions measuring up to 2.2 cm, increased in size compared to 11/29/2020, and new compared to 11/29/2020, demonstrate gradual contrast enhancement on delayed phase images. No associated diffusion restriction. Differential includes littoral cell angioma, fibrous tumor, and sclerosing angiomatoid nodular transformation (SANT).  I have relayed this to Dr. Al Pimple as well.   IBD - needs to follow up with GI as outpatient for colonoscopy as outlined above GERD Tobacco abuse - only smokes during stressful times Anemia   I reviewed Consultant GI notes, hospitalist notes, last 24 h vitals and pain scores, last 48 h intake and output, last 24 h labs and trends, and last 24 h imaging results.  Disposition: FLD, cont IV abx  LOS: 1 day    Francetta Ilg M. Andrey Campanile, MD, FACS General, Bariatric, & Minimally Invasive Surgery [phone] [facility], [facility]

## 2022-11-19 NOTE — TOC Initial Note (Signed)
Transition of Care [facility]) - Initial/Assessment Note    Patient Details  Name: Nicole Browning MRN: [id number] Date of Birth: 15-Jan-1971  Transition of Care [facility]) CM/SW Contact:    Lamonte Sakai, Student-Social Work Phone Number: 11/19/2022, 2:58 PM  Clinical Narrative:                 Received request from MD to speak with patient. Pt was asleep when CSW and MSW Intern arrived. Resources left at bedside, will attempt again tomorrow.  Expected Discharge Plan: Home/Self Care     Patient Goals and CMS Choice            Expected Discharge Plan and Services       Living arrangements for the past 2 months: Single Family Home                                      Prior Living Arrangements/Services Living arrangements for the past 2 months: Single Family Home                  Criminal Activity/Legal Involvement Pertinent to Current Situation/Hospitalization: No - Comment as needed  Activities of Daily Living      Permission Sought/Granted                  Emotional Assessment       Orientation: : Oriented to Self, Oriented to Place, Oriented to  Time, Oriented to Situation   Psych Involvement: No (comment)  Admission diagnosis:  Diverticulitis [K57.92] Bowel perforation (HCC) [K63.1] Intra-abdominal abscess (HCC) [K65.1] Patient Active Problem List   Diagnosis Date Noted   Malignant neoplasm of upper-outer quadrant of right breast in female, estrogen receptor positive (HCC) 07/01/2022   Diverticulitis 12/13/2020   Smoking 11/22/2020   Liver abscess 10/17/2020   Medication monitoring encounter 10/17/2020   Streptococcal bacteremia    Intra-abdominal abscess (HCC) 10/03/2020   IDA (iron deficiency anemia) 07/24/2020   IBD (inflammatory bowel disease) 07/24/2020   Protein-calorie malnutrition (HCC) 02/29/2016   Elevated hemoglobin A1c 02/27/2016   GERD (gastroesophageal reflux disease)    Tobacco use    Anemia of chronic disease     Prediabetes    Perirectal abscess 02/24/2016   PCP:  Grayce Sessions, NP Pharmacy:   [facility] [phone] Ginette Otto, [place] - [address] AT [facility] [address] [place] [address] Phone: [phone] Fax: [phone]  [id number] [place] Otto, [place] - [address] AT [facility] OF [address] [place] [address] Phone: [phone] Fax: [phone]  [facility] [address] [place] [address] Phone: [phone] Fax: [phone]     Social Determinants of Health (SDOH) Social History: SDOH Screenings   Food Insecurity: No Food Insecurity (11/18/2022)  Recent Concern: Food Insecurity - Food Insecurity Present (09/11/2022)  Housing: Low Risk  (11/18/2022)  Recent Concern: Housing - Medium Risk (09/11/2022)  Transportation Needs: No Transportation Needs (11/18/2022)  Utilities: Not At Risk (11/18/2022)  Recent Concern: Utilities - At Risk (09/11/2022)  Depression (PHQ2-9): Medium Risk (09/11/2022)  Financial Resource Strain: Medium Risk (08/29/2022)  Tobacco Use: High Risk (11/17/2022)   SDOH Interventions:     Readmission Risk Interventions     No data to display

## 2022-11-19 NOTE — Progress Notes (Signed)
PROGRESS NOTE                                                                                                                                                                                                             Patient Demographics:    Nicole Browning, is a 52 y.o. female, DOB - 06/30/1970, XBJ:[id number]  Outpatient Primary MD for the patient is Nicole Sessions, NP    LOS - 1  Admit date - 11/17/2022    Chief Complaint  Patient presents with   Abdominal Pain       Brief Narrative (HPI from H&P)    51 y.o. female with medical history significant of GERD, anemia, breast cancer, diverticulitis, IBD presenting with abdominal pain for the last 2 to 3 days, pain is diffuse but mostly on the left side of her abdomen, came to the ER where workup suggestive of hepatic abscess, small pneumoperitoneum and nonspecific findings on spleen.  Admitted for further care.    Subjective:    Nicole Browning today denies fever, chills, chest pain, no nausea, no vomiting, asking to advance her diet.     Assessment  & Plan :   Sigmoid diverticulitis with CT findings consistent of potential hepatic abscesses, nonspecific findings on spleen as well.  - she is currently being treated conservatively with bowel rest, IV fluids, empiric IV antibiotics,  -General Surgery consult greatly appreciated, she is advanced to full liquid diet . -GI consult greatly appreciated, plan for repeat colonoscopy as an outpatient when more stable  -All continues to improve, asking to advance her diet, management per general surgery, she is advanced to full liquid diet, now she is tolerating p.o. will transition her to p.o. Augmentin, plan for 6 weeks of oral antibiotics, and repeat CT abdomen pelvis in 3 to 4 weeks . -Patient to follow-up with ID Dr. Danella Browning on 10/4 at 9:45 AM.     Nonspecific spleen findings on MRI.   -GI input greatly appreciated,  recommendation to follow-up with oncologist as an outpatient.  GERD.  PPI.  AOCD - monitor.  HX of breast cancer.  Status post recent lumpectomy, currently undergoing outpatient radiation.  Post discharge follow-up with her oncologist.      Condition - Fair  Family Communication  :  None  Code Status :  Full  Consults  :  CCS, ID, GI  PUD Prophylaxis :     Procedures  :     CT -  Evidence of sigmoid colon diverticulitis with wall thickening, stranding. However there is also some extraluminal loculated areas of fluid along the left side consistent with the areas of potential abscess. And there is also presence of scattered free air. Component of perforation. Free air extends throughout the abdomen including along the umbilicus and adjacent to the diaphragm. No component of obstruction. New from previous are some heterogeneous cystic like areas scattered in the right hepatic lobe . Based on appearance favor these being small abscesses. There is also a potential component of some distal branch portal vein areas of thrombosis. These could be evaluated further with pre and postcontrast dynamic MRI when clinically appropriate and as necessary. New solid-appearing splenic lesions. Additional workup when appropriate. Changes of healing anal fistula. Stable wall thickening of the distal stomach.   MRI -  1. Multifocal, peripherally enhancing T2 hyperintensities within segments 7 and 8 associated with diffusion restriction, most consistent with hepatic abscesses. Portal vein is patent. 2. Heterogeneous parenchymal signal of the right hepatic lobe involving predominantly segment 7 and 8, may be reactive or reflect hepatitis. 3. Mild intrahepatic bile duct dilation. No common bile duct dilation. 4. T2 hypointense splenic lesions measuring up to 2.2 cm, increased in size compared to 11/29/2020, and new compared to 11/29/2020, demonstrate gradual contrast enhancement on delayed phase images. No associated  diffusion restriction. Differential includes littoral cell angioma, fibrous tumor, and sclerosing angiomatoid nodular transformation (SANT). 5. Small volume pneumoperitoneum is better seen on same day CT.      Disposition Plan  :    Status is: Inpatient  DVT Prophylaxis  :    heparin injection 5,000 Units Start: 11/17/22 1400    Lab Results  Component Value Date   PLT 335 11/19/2022    Diet :  Diet Order             Diet full liquid Room service appropriate? Yes; Fluid consistency: Thin  Diet effective now                    Inpatient Medications  Scheduled Meds:  amoxicillin-clavulanate  1 tablet Oral Q12H   feeding supplement  237 mL Oral BID BM   gabapentin  100 mg Oral TID   heparin  5,000 Units Subcutaneous Q8H   [START ON 11/20/2022] pantoprazole  40 mg Oral Daily   sodium chloride flush  3 mL Intravenous Q12H   Continuous Infusions:  sodium chloride 50 mL/hr at 11/19/22 0635   PRN Meds:.acetaminophen **OR** acetaminophen, bismuth subsalicylate, HYDROmorphone (DILAUDID) injection, polyethylene glycol, zolpidem  Antibiotics  :    Anti-infectives (From admission, onward)    Start     Dose/Rate Route Frequency Ordered Stop   11/18/22 1300  amoxicillin-clavulanate (AUGMENTIN) 875-125 MG per tablet 1 tablet        1 tablet Oral Every 12 hours 11/18/22 1200     11/17/22 1800  piperacillin-tazobactam (ZOSYN) IVPB 3.375 g  Status:  Discontinued        3.375 g 12.5 mL/hr over 240 Minutes Intravenous Every 8 hours 11/17/22 1313 11/18/22 1200   11/17/22 1100  piperacillin-tazobactam (ZOSYN) IVPB 3.375 g        3.375 g 100 mL/hr over 30 Minutes Intravenous  Once 11/17/22 1053 11/17/22 1310         Objective:   Vitals:   11/18/22 1825 11/18/22 1952 11/18/22 2329  11/19/22 0338  BP:  109/80 111/70 115/75  Pulse:  100 97 92  Resp: 16 16 17 16   Temp: 98.5 F (36.9 C) 98.4 F (36.9 C) 98.4 F (36.9 C) 98.3 F (36.8 C)  TempSrc: Oral Oral Oral Oral   SpO2:  97%    Weight:      Height:        Wt Readings from Last 3 Encounters:  11/17/22 56.7 kg  11/03/22 56.7 kg  09/10/22 55 kg     Intake/Output Summary (Last 24 hours) at 11/19/2022 1144 Last data filed at 11/19/2022 4742 Gross per 24 hour  Intake 1469.66 ml  Output --  Net 1469.66 ml     Physical Exam  Awake Alert, Oriented X 3, No new F.N deficits, Normal affect Symmetrical Chest wall movement, Good air movement bilaterally, CTAB RRR,No Gallops,Rubs or new Murmurs, No Parasternal Heave +ve B.Sounds, Abd Soft, No tenderness, No rebound - guarding or rigidity. No Cyanosis, Clubbing or edema, No new Rash or bruise          Data Review:    Recent Labs  Lab 11/17/22 0845 11/18/22 0521 11/19/22 0331  WBC 9.6 7.4 6.2  HGB 11.1* 9.0* 8.8*  HCT 35.0* 28.5* 27.2*  PLT 392 318 335  MCV 85.8 84.3 84.0  MCH 27.2 26.6 27.2  MCHC 31.7 31.6 32.4  RDW 14.3 14.2 14.0  LYMPHSABS  --   --  1.2  MONOABS  --   --  0.8  EOSABS  --   --  0.1  BASOSABS  --   --  0.0    Recent Labs  Lab 11/17/22 0845 11/17/22 1153 11/18/22 0609 11/19/22 0331  NA 135  --  135 136  K 3.7  --  3.6 3.6  CL 100  --  104 101  CO2 26  --  25 25  ANIONGAP 9  --  6 10  GLUCOSE 99  --  94 100*  BUN 7  --  <5* <5*  CREATININE 0.87  --  0.77 0.70  AST 16  --  13* 13*  ALT 13  --  12 13  ALKPHOS 80  --  67 69  BILITOT 0.5  --  0.5 0.4  ALBUMIN 2.6*  --  2.1* 2.0*  CRP  --   --  19.5* 20.4*  PROCALCITON  --   --  0.10 <0.10  LATICACIDVEN  --  0.8  --   --   MG  --   --  1.7 1.7  CALCIUM 8.7*  --  8.0* 8.1*      Recent Labs  Lab 11/17/22 0845 11/17/22 1153 11/18/22 0609 11/19/22 0331  CRP  --   --  19.5* 20.4*  PROCALCITON  --   --  0.10 <0.10  LATICACIDVEN  --  0.8  --   --   MG  --   --  1.7 1.7  CALCIUM 8.7*  --  8.0* 8.1*      Lab Results  Component Value Date   HGBA1C 5.5 04/04/2020     Micro Results Recent Results (from the past 240 hour(s))  Culture, blood  (routine x 2)     Status: None (Preliminary result)   Collection Time: 11/17/22 11:37 AM   Specimen: BLOOD RIGHT ARM  Result Value Ref Range Status   Specimen Description BLOOD RIGHT ARM  Final   Special Requests   Final    BOTTLES DRAWN AEROBIC AND ANAEROBIC Blood Culture adequate  volume   Culture   Final    NO GROWTH 2 DAYS Performed at [facility], [address] [place], [place] [address]    Report Status PENDING  Incomplete  Culture, blood (routine x 2)     Status: None (Preliminary result)   Collection Time: 11/17/22 11:37 AM   Specimen: BLOOD RIGHT ARM  Result Value Ref Range Status   Specimen Description BLOOD RIGHT ARM  Final   Special Requests   Final    BOTTLES DRAWN AEROBIC AND ANAEROBIC Blood Culture adequate volume   Culture   Final    NO GROWTH 2 DAYS Performed at [facility], [address], [place], [place] [address]    Report Status PENDING  Incomplete    Radiology Reports DG Chest Port 1 View  Result Date: 11/18/2022 CLINICAL DATA:  Shortness of breath, abdominal pain EXAM: PORTABLE CHEST - 1 VIEW COMPARISON:  CT abdomen from previous day, and earlier exams FINDINGS: Lungs are clear. Heart size and mediastinal contours are within normal limits. No effusion. Surgical clips right axilla. Free intraperitoneal air as noted on yesterday's CT. IMPRESSION: 1. No acute cardiopulmonary disease. 2. Free intraperitoneal air. Electronically Signed   By: Corlis Leak M.D.   On: 11/18/2022 07:58   MR ABDOMEN W WO CONTRAST  Result Date: 11/17/2022 CLINICAL DATA:  Suspected hepatic abscesses on same day CT with possible distal branch portal venous thrombosis EXAM: MRI ABDOMEN WITHOUT AND WITH CONTRAST TECHNIQUE: Multiplanar multisequence MR imaging of the abdomen was performed both before and after the administration of intravenous contrast. CONTRAST:  5mL GADAVIST GADOBUTROL 1 MMOL/ML IV SOLN COMPARISON:  Same day CT abdomen and pelvis and priors FINDINGS: Lower  chest: No acute findings. Hepatobiliary: Heterogeneous parenchymal signal of the right hepatic lobe involving predominantly segment 7 and 8. Multifocal, peripherally enhancing T2 hyperintensities within segments 7 and 8 associated with diffusion restriction, for example 1.0 x 0.9 cm in segment 7 (12:41), which demonstrates branching pattern, and cluster of subcentimeter foci in the hepatic dome measuring 2.1 x 2.0 cm in conglomerate. Mild intrahepatic bile duct dilation. No common bile duct dilation. Normal gallbladder. Pancreas: No mass, inflammatory changes, or other parenchymal abnormality identified. Spleen: T2 hypointense splenic lesions measuring 2.2 x 2.0 cm (12:40), previously 1.0 x 0.8 cm on 11/29/2020, and 1.1 x 1.1 cm (12:42), new compared to 11/29/2020, demonstrate gradual contrast enhancement on delayed phase images. No associated diffusion restriction. Adrenals/Urinary Tract: No adrenal nodules. No suspicious renal masses identified. No evidence of hydronephrosis. Left lower pole simple cyst. No specific follow-up imaging recommended. Stomach/Bowel: Visualized portions within the abdomen are unremarkable. Vascular/Lymphatic: No pathologically enlarged lymph nodes identified. No abdominal aortic aneurysm demonstrated. Portal vein is patent. Other:  Small volume pneumoperitoneum is better seen on same day CT. Musculoskeletal: No suspicious bone lesions identified. IMPRESSION: 1. Multifocal, peripherally enhancing T2 hyperintensities within segments 7 and 8 associated with diffusion restriction, most consistent with hepatic abscesses. Portal vein is patent. 2. Heterogeneous parenchymal signal of the right hepatic lobe involving predominantly segment 7 and 8, may be reactive or reflect hepatitis. 3. Mild intrahepatic bile duct dilation. No common bile duct dilation. 4. T2 hypointense splenic lesions measuring up to 2.2 cm, increased in size compared to 11/29/2020, and new compared to 11/29/2020,  demonstrate gradual contrast enhancement on delayed phase images. No associated diffusion restriction. Differential includes littoral cell angioma, fibrous tumor, and sclerosing angiomatoid nodular transformation (SANT). 5. Small volume pneumoperitoneum is better seen on same day  CT. Electronically Signed   By: Agustin Cree M.D.   On: 11/17/2022 16:29   CT ABDOMEN PELVIS W CONTRAST  Result Date: 11/17/2022 CLINICAL DATA:  Acute onset abdominal pain EXAM: CT ABDOMEN AND PELVIS WITH CONTRAST TECHNIQUE: Multidetector CT imaging of the abdomen and pelvis was performed using the standard protocol following bolus administration of intravenous contrast. RADIATION DOSE REDUCTION: This exam was performed according to the departmental dose-optimization program which includes automated exposure control, adjustment of the mA and/or kV according to patient size and/or use of iterative reconstruction technique. CONTRAST:  75mL OMNIPAQUE IOHEXOL 350 MG/ML SOLN COMPARISON:  CT 11/29/2020 FINDINGS: Lower chest: There is some linear opacity lung bases likely scar or atelectasis. No pleural effusion. Hepatobiliary: Mild hepatomegaly. There are several ill-defined cystic areas in the right hepatic lobe involving segments 7, 8. Example segment 8 centrally on series 3, image 10 measuring 12 mm. These new from previous. There appear to be some areas of biliary ectasia and some distal branches of the portal vein which are poorly enhancing. Gallbladder is distended. Pancreas: Unremarkable. No pancreatic ductal dilatation or surrounding inflammatory changes. Spleen: Increasing low-attenuation splenic lesions. There are 2 lesion seen. Larger on series 3, image 14 now measures 2.4 by 1.9 cm. These are not clearly cysts. Adrenals/Urinary Tract: Adrenal glands are preserved. Stable Bosniak 1 midportion left-sided renal cyst. No enhancing renal mass. No collecting system dilatation. Preserved contours of the urinary bladder. Stomach/Bowel: On  this non oral contrast exam there is significant wall thickening with stranding along the sigmoid colon with diverticula. There is some extraluminal loculated fluid and air extending lateral with multiple small pockets. The area overall measures up to 5.4 by 3.1 cm on series 3, image 67. There is extensive associated adjacent stranding. There is scattered free air throughout the mesentery including up along the margin of the liver and beneath the right hemidiaphragm consistent with components of perforation. Large bowel otherwise is nondilated with scattered stool. The small bowel is nondilated with a few scattered air-fluid levels. The stomach is underdistended. Question some wall thickening along the distal stomach as seen previously but again the stomach is underdistended. Vascular/Lymphatic: Few prominent pelvic lymph nodes on the left which could be reactive including perirectal space nodes. Mild vascular calcifications. Normal caliber aorta and IVC Reproductive: Uterus and bilateral adnexa are unremarkable. Other: There is bandlike soft tissue thickening along the left ischioanal fossa. Previously there was a fistula in this location. This could be the sequela of previous fistula. No residual rim enhancing fluid collection or soft tissue gas. Please correlate for symptoms. Musculoskeletal: Stable sclerosis of the right sacroiliac joint. Scattered degenerative changes otherwise. IMPRESSION: Evidence of sigmoid colon diverticulitis with wall thickening, stranding. However there is also some extraluminal loculated areas of fluid along the left side consistent with the areas of potential abscess. And there is also presence of scattered free air. Component of perforation. Free air extends throughout the abdomen including along the umbilicus and adjacent to the diaphragm. No component of obstruction. New from previous are some heterogeneous cystic like areas scattered in the right hepatic lobe . Based on appearance  favor these being small abscesses. There is also a potential component of some distal branch portal vein areas of thrombosis. These could be evaluated further with pre and postcontrast dynamic MRI when clinically appropriate and as necessary. New solid-appearing splenic lesions. Additional workup when appropriate. Changes of healing anal fistula. Stable wall thickening of the distal stomach. Critical Value/emergent results were called by telephone  at the time of interpretation on 11/17/2022 at 7:52 am to provider [facility] , who verbally acknowledged these results. Electronically Signed   By: Karen Kays M.D.   On: 11/17/2022 10:53      Signature  -   Mliss Fritz Lubertha Leite M.D on 11/19/2022 at 11:44 AM   -  To page go to [email or web address]

## 2022-11-19 NOTE — Telephone Encounter (Signed)
Spoke with patient on 9/18 regarding scheduling follow up appointment; patient stated that she wants to wait until after she has gotten out of the hospital to have any further appointments scheduled, she has stated that she doesn't have the transportation and will need to coordinate it once she gets out, message has been sent over to provider

## 2022-11-19 NOTE — Plan of Care (Signed)

## 2022-11-20 ENCOUNTER — Ambulatory Visit: Payer: Medicaid Other

## 2022-11-20 ENCOUNTER — Other Ambulatory Visit (HOSPITAL_COMMUNITY): Payer: Self-pay

## 2022-11-20 DIAGNOSIS — K651 Peritoneal abscess: Secondary | ICD-10-CM | POA: Diagnosis not present

## 2022-11-20 LAB — CBC WITH DIFFERENTIAL/PLATELET
Abs Immature Granulocytes: 0.05 K/uL (ref 0.00–0.07)
Basophils Absolute: 0 K/uL (ref 0.0–0.1)
Basophils Relative: 0 %
Eosinophils Absolute: 0.2 K/uL (ref 0.0–0.5)
Eosinophils Relative: 3 %
HCT: 29.8 % — ABNORMAL LOW (ref 36.0–46.0)
Hemoglobin: 9.7 g/dL — ABNORMAL LOW (ref 12.0–15.0)
Immature Granulocytes: 1 %
Lymphocytes Relative: 18 %
Lymphs Abs: 1.2 K/uL (ref 0.7–4.0)
MCH: 27.8 pg (ref 26.0–34.0)
MCHC: 32.6 g/dL (ref 30.0–36.0)
MCV: 85.4 fL (ref 80.0–100.0)
Monocytes Absolute: 0.5 K/uL (ref 0.1–1.0)
Monocytes Relative: 8 %
Neutro Abs: 4.6 K/uL (ref 1.7–7.7)
Neutrophils Relative %: 70 %
Platelets: 379 K/uL (ref 150–400)
RBC: 3.49 MIL/uL — ABNORMAL LOW (ref 3.87–5.11)
RDW: 14 % (ref 11.5–15.5)
WBC: 6.5 K/uL (ref 4.0–10.5)
nRBC: 0 % (ref 0.0–0.2)

## 2022-11-20 LAB — COMPREHENSIVE METABOLIC PANEL
ALT: 14 U/L (ref 0–44)
AST: 15 U/L (ref 15–41)
Albumin: 2.2 g/dL — ABNORMAL LOW (ref 3.5–5.0)
Alkaline Phosphatase: 70 U/L (ref 38–126)
Anion gap: 6 (ref 5–15)
BUN: <5 mg/dL — ABNORMAL LOW (ref 6–20)
CO2: 26 mmol/L (ref 22–32)
Calcium: 8.6 mg/dL — ABNORMAL LOW (ref 8.9–10.3)
Chloride: 105 mmol/L (ref 98–111)
Creatinine, Ser: 0.74 mg/dL (ref 0.44–1.00)
GFR, Estimated: >60 mL/min (ref >60)
Glucose, Bld: 96 mg/dL (ref 70–99)
Potassium: 4 mmol/L (ref 3.5–5.1)
Sodium: 137 mmol/L (ref 135–145)
Total Bilirubin: 0.2 mg/dL — ABNORMAL LOW (ref 0.3–1.2)
Total Protein: 7.2 g/dL (ref 6.5–8.1)

## 2022-11-20 LAB — C-REACTIVE PROTEIN: CRP: 17.3 mg/dL — ABNORMAL HIGH

## 2022-11-20 LAB — PROCALCITONIN: Procalcitonin: <0.1 ng/mL

## 2022-11-20 LAB — MAGNESIUM: Magnesium: 1.8 mg/dL (ref 1.7–2.4)

## 2022-11-20 MED ORDER — AMOXICILLIN-POT CLAVULANATE 875-125 MG PO TABS
1.0000 | ORAL_TABLET | Freq: Two times a day (BID) | ORAL | 0 refills | Status: AC
Start: 2022-11-20 — End: 2022-12-30
  Filled 2022-11-20: qty 60, 30d supply, fill #0

## 2022-11-20 NOTE — TOC Transition Note (Signed)
Transition of Care [facility]) - CM/SW Discharge Note   Patient Details  Name: Nicole Browning MRN: [id number] Date of Birth: 03-10-1970  Transition [facility] Care [facility]) CM/SW Contact:  Gordy Clement, RN Phone Number: 11/20/2022, 3:33 PM   Clinical Narrative:    Patient will DC to home  No recommendations for DME or follow up therapies.  Patient will follow up with PCP as instructed on AVS   No additional TOC needs             Patient Goals and CMS Choice      Discharge Placement                         Discharge Plan and Services Additional resources added to the After Visit Summary for   In-house Referral: Clinical Social Work                                   Social Determinants of Health (SDOH) Interventions SDOH Screenings   Food Insecurity: No Food Insecurity (11/18/2022)  Recent Concern: Food Insecurity - Food Insecurity Present (09/11/2022)  Housing: Low Risk  (11/18/2022)  Recent Concern: Housing - Medium Risk (09/11/2022)  Transportation Needs: No Transportation Needs (11/18/2022)  Utilities: Not At Risk (11/18/2022)  Recent Concern: Utilities - At Risk (09/11/2022)  Depression (PHQ2-9): Medium Risk (09/11/2022)  Financial Resource Strain: Medium Risk (08/29/2022)  Tobacco Use: High Risk (11/17/2022)     Readmission Risk Interventions     No data to display

## 2022-11-20 NOTE — Discharge Instructions (Signed)
Follow with Primary MD Grayce Sessions, NP in 7 days   Get CBC, CMP, checked  by Primary MD next visit.    Activity: As tolerated with Full fall precautions use walker/cane & assistance as needed   Disposition Home    Diet: Heart Healthy    On your next visit with your primary care physician please Get Medicines reviewed and adjusted.   Please request your Prim.MD to go over all Hospital Tests and Procedure/Radiological results at the follow up, please get all Hospital records sent to your Prim MD by signing hospital release before you go home.   If you experience worsening of your admission symptoms, develop shortness of breath, life threatening emergency, suicidal or homicidal thoughts you must seek medical attention immediately by calling 911 or calling your MD immediately  if symptoms less severe.  You Must read complete instructions/literature along with all the possible adverse reactions/side effects for all the Medicines you take and that have been prescribed to you. Take any new Medicines after you have completely understood and accpet all the possible adverse reactions/side effects.   Do not drive, operating heavy machinery, perform activities at heights, swimming or participation in water activities or provide baby sitting services if your were admitted for syncope or siezures until you have seen by Primary MD or a Neurologist and advised to do so again.  Do not drive when taking Pain medications.    Do not take more than prescribed Pain, Sleep and Anxiety Medications  Special Instructions: If you have smoked or chewed Tobacco  in the last 2 yrs please stop smoking, stop any regular Alcohol  and or any Recreational drug use.  Wear Seat belts while driving.   Please note  You were cared for by a hospitalist during your hospital stay. If you have any questions about your discharge medications or the care you received while you were in the hospital after you are  discharged, you can call the unit and asked to speak with the hospitalist on call if the hospitalist that took care of you is not available. Once you are discharged, your primary care physician will handle any further medical issues. Please note that NO REFILLS for any discharge medications will be authorized once you are discharged, as it is imperative that you return to your primary care physician (or establish a relationship with a primary care physician if you do not have one) for your aftercare needs so that they can reassess your need for medications and monitor your lab values.

## 2022-11-20 NOTE — TOC Progression Note (Signed)
Transition of Care [facility]) - Progression Note    Patient Details  Name: Nicole Browning MRN: [id number] Date of Birth: [birth year]  Transition of Care [facility]) CM/SW Contact  Swannie Milius Reeves Forth, Student-Social Work Phone Number: 11/20/2022, 4:31 PM  Clinical Narrative:    MSW Intern spoke with pt to answer any questions she had. Pt stated she had received and looked over the resource packets MSW Intern left in her room yesterday.     Expected Discharge Plan: Home/Self Care    Expected Discharge Plan and Services In-house Referral: Clinical Social Work     Living arrangements for the past 2 months: Single Family Home Expected Discharge Date: 11/20/22                                     Social Determinants of Health (SDOH) Interventions SDOH Screenings   Food Insecurity: No Food Insecurity (11/18/2022)  Recent Concern: Food Insecurity - Food Insecurity Present (09/11/2022)  Housing: Low Risk  (11/18/2022)  Recent Concern: Housing - Medium Risk (09/11/2022)  Transportation Needs: No Transportation Needs (11/18/2022)  Utilities: Not At Risk (11/18/2022)  Recent Concern: Utilities - At Risk (09/11/2022)  Depression (PHQ2-9): Medium Risk (09/11/2022)  Financial Resource Strain: Medium Risk (08/29/2022)  Tobacco Use: High Risk (11/17/2022)    Readmission Risk Interventions     No data to display

## 2022-11-20 NOTE — Plan of Care (Signed)
Pt has rested quietly throughout the night with no distress noted. Alert and oriented. ON room air. SR. Up to BR to void. Medicated for pain and sleep once with relief noted. No other complaints voiced.     Problem: Education: Goal: Knowledge of General Education information will improve Description: Including pain rating scale, medication(s)/side effects and non-pharmacologic comfort measures Outcome: Progressing   Problem: Health Behavior/Discharge Planning: Goal: Ability to manage health-related needs will improve Outcome: Progressing   Problem: Clinical Measurements: Goal: Ability to maintain clinical measurements within normal limits will improve Outcome: Progressing Goal: Will remain free from infection Outcome: Progressing Goal: Diagnostic test results will improve Outcome: Progressing Goal: Respiratory complications will improve Outcome: Progressing Goal: Cardiovascular complication will be avoided Outcome: Progressing   Problem: Activity: Goal: Risk for activity intolerance will decrease Outcome: Progressing   Problem: Nutrition: Goal: Adequate nutrition will be maintained Outcome: Progressing   Problem: Coping: Goal: Level of anxiety will decrease Outcome: Progressing   Problem: Elimination: Goal: Will not experience complications related to bowel motility Outcome: Progressing Goal: Will not experience complications related to urinary retention Outcome: Progressing   Problem: Pain Managment: Goal: General experience of comfort will improve Outcome: Progressing   Problem: Safety: Goal: Ability to remain free from injury will improve Outcome: Progressing   Problem: Skin Integrity: Goal: Risk for impaired skin integrity will decrease Outcome: Progressing
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
## 2022-11-20 NOTE — Progress Notes (Signed)
Patient ID: Nicole Browning, female   DOB: [birth year], 52 y.o.   MRN: [id number]   Acute Care Surgery Service Progress Note:    Chief Complaint/Subjective: Reports BM Tolerating FLD No n/v Abd pain improved, now described as sore   Objective: Vital signs in last 24 hours: Temp:  [98.2 F (36.8 C)-98.8 F (37.1 C)] 98.5 F (36.9 C) (09/19 0300) Pulse Rate:  [82-88] 82 (09/19 0300) Resp:  [16-22] 16 (09/19 0033) BP: (98-122)/(59-92) 122/92 (09/19 0300) SpO2:  [98 %] 98 % (09/19 0300) Weight:  [56.5 kg] 56.5 kg (09/19 0500) Last BM Date : 11/18/22  Intake/Output from previous day: 09/18 0701 - 09/19 0700 In: 480 [P.O.:480] Out: -  Intake/Output this shift: No intake/output data recorded.  Lungs: cta, nonlabored  Cardiovascular: reg  Abd: soft, some distension, mild LLQ TTP; no rebound  Extremities: no edema, +SCDs  Neuro: alert, nonfocal  Lab Results: CBC  Recent Labs    11/19/22 0331 11/20/22 0608  WBC 6.2 6.5  HGB 8.8* 9.7*  HCT 27.2* 29.8*  PLT 335 379   BMET Recent Labs    11/19/22 0331 11/20/22 0608  NA 136 137  K 3.6 4.0  CL 101 105  CO2 25 26  GLUCOSE 100* 96  BUN <5* <5*  CREATININE 0.70 0.74  CALCIUM 8.1* 8.6*   LFT    Latest Ref Rng & Units 11/20/2022    6:08 AM 11/19/2022    3:31 AM 11/18/2022    6:09 AM  Hepatic Function  Total Protein 6.5 - 8.1 g/dL 7.2  6.5  6.7   Albumin 3.5 - 5.0 g/dL 2.2  2.0  2.1   AST 15 - 41 U/L 15  13  13    ALT 0 - 44 U/L 14  13  12    Alk Phosphatase 38 - 126 U/L 70  69  67   Total Bilirubin 0.3 - 1.2 mg/dL 0.2  0.4  0.5    PT/INR No results for input(s): "LABPROT", "INR" in the last 72 hours. ABG No results for input(s): "PHART", "HCO3" in the last 72 hours.  Invalid input(s): "PCO2", "PO2"  Studies/Results:  Anti-infectives: Anti-infectives (From admission, onward)    Start     Dose/Rate Route Frequency Ordered Stop   11/18/22 1300  amoxicillin-clavulanate (AUGMENTIN) 875-125 MG per tablet 1  tablet        1 tablet Oral Every 12 hours 11/18/22 1200     11/17/22 1800  piperacillin-tazobactam (ZOSYN) IVPB 3.375 g  Status:  Discontinued        3.375 g 12.5 mL/hr over 240 Minutes Intravenous Every 8 hours 11/17/22 1313 11/18/22 1200   11/17/22 1100  piperacillin-tazobactam (ZOSYN) IVPB 3.375 g        3.375 g 100 mL/hr over 30 Minutes Intravenous  Once 11/17/22 1053 11/17/22 1310       Medications: Scheduled Meds:  amoxicillin-clavulanate  1 tablet Oral Q12H   feeding supplement  237 mL Oral BID BM   gabapentin  100 mg Oral TID   heparin  5,000 Units Subcutaneous Q8H   pantoprazole  40 mg Oral Daily   sodium chloride flush  3 mL Intravenous Q12H   Continuous Infusions:   PRN Meds:.acetaminophen **OR** acetaminophen, bismuth subsalicylate, HYDROmorphone (DILAUDID) injection, polyethylene glycol, zolpidem  Assessment/Plan: Patient Active Problem List   Diagnosis Date Noted   Malignant neoplasm of upper-outer quadrant of right breast in female, estrogen receptor positive (HCC) 07/01/2022   Diverticulitis 12/13/2020   Smoking 11/22/2020  Liver abscess 10/17/2020   Medication monitoring encounter 10/17/2020   Streptococcal bacteremia    Intra-abdominal abscess (HCC) 10/03/2020   IDA (iron deficiency anemia) 07/24/2020   IBD (inflammatory bowel disease) 07/24/2020   Protein-calorie malnutrition (HCC) 02/29/2016   Elevated hemoglobin A1c 02/27/2016   GERD (gastroesophageal reflux disease)    Tobacco use    Anemia of chronic disease    Prediabetes    Perirectal abscess 02/24/2016   Diverticulitis with perforation, possible small hepatic abscesses  -continues to improve -cont IV abx therapy for now - ok to transition to PO abx, diet advanced to soft. -  Plan 6 weeks oral abx and repeat abd pelv ct in around 3-4 weeks in clinic;  ID f/u 10/4 @ 945 am -  Would likely need and updated c-scope since last one was in 2022.  Given she has had 2 episodes now of some type of  infectious colonic process causing hepatic abscesses, it would likely be beneficial for her to follow up after c-scope with one of our colorectal surgeons.  Her c-scope 2 years ago appears more c/w IBD, but her imaging now seems more c/w diverticulitis.  Either way, continue conservative now and more follow up as an outpatient. -d/w POC with primary team   FEN - soft  VTE - heparin ID - zosyn   Possible portal vein thrombosis - MRI negative for thrombus.  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  Followed by Dr. Al Pimple, Dr. Roselind Messier, and Dr. Donell Beers.  She will see Dr. Al Pimple in 1-2 weeks after discharge. New solid-appearing splenic lesions - MRI T2 hypointense splenic lesions measuring up to 2.2 cm, increased in size compared to 11/29/2020, and new compared to 11/29/2020, demonstrate gradual contrast enhancement on delayed phase images. No associated diffusion restriction. Differential includes littoral cell angioma, fibrous tumor, and sclerosing angiomatoid nodular transformation (SANT).  I have relayed this to Dr. Al Pimple as well.   IBD - needs to follow up with GI as outpatient for colonoscopy as outlined above GERD Tobacco abuse - only smokes during stressful times Anemia   I reviewed Consultant GI notes, hospitalist notes, last 24 h vitals and pain scores, last 48 h intake and output, last 24 h labs and trends, and last 24 h imaging results.  Disposition: FLD, cont IV abx  LOS: 2 days    Hosie Spangle, PA-C [phone] [facility]

## 2022-11-21 ENCOUNTER — Ambulatory Visit: Payer: Medicaid Other

## 2022-11-21 ENCOUNTER — Telehealth: Payer: Self-pay

## 2022-11-21 NOTE — Telephone Encounter (Signed)
Called patient and scheduled patient for Colonoscopy  01/06/23 @ 11:30pm. Previst scheduled for 12/26/22.

## 2022-11-22 LAB — CULTURE, BLOOD (ROUTINE X 2)
Culture: NO GROWTH
Culture: NO GROWTH
Special Requests: ADEQUATE
Special Requests: ADEQUATE

## 2022-11-24 ENCOUNTER — Ambulatory Visit
Admission: RE | Admit: 2022-11-24 | Discharge: 2022-11-24 | Disposition: A | Discharge status: Home / Self Care | Payer: Medicaid Other | Source: Ambulatory Visit | Attending: Radiation Oncology | Admitting: Radiation Oncology

## 2022-11-24 ENCOUNTER — Other Ambulatory Visit: Payer: Self-pay

## 2022-11-24 ENCOUNTER — Telehealth: Payer: Self-pay

## 2022-11-24 ENCOUNTER — Ambulatory Visit: Payer: Medicaid Other

## 2022-11-24 DIAGNOSIS — Z170 Estrogen receptor positive status [ER+]: Secondary | ICD-10-CM | POA: Diagnosis present

## 2022-11-24 DIAGNOSIS — C50411 Malignant neoplasm of upper-outer quadrant of right female breast: Secondary | ICD-10-CM | POA: Diagnosis present

## 2022-11-24 LAB — RAD ONC ARIA SESSION SUMMARY
Course Elapsed Days: 47
Plan Fractions Treated to Date: 2
Plan Prescribed Dose Per Fraction: 2 Gy
Plan Total Fractions Prescribed: 6
Plan Total Prescribed Dose: 12 Gy
Reference Point Dosage Given to Date: 4 Gy
Reference Point Session Dosage Given: 2 Gy
Session Number: 17

## 2022-11-24 NOTE — Transitions of Care (Post Inpatient/ED Visit) (Signed)
11/24/2022  Name: Nicole Browning MRN: [id number] DOB: [birth year]  Today's TOC FU Call Status: Today's TOC FU Call Status:: Unsuccessful Call (1st Attempt) Unsuccessful Call (1st Attempt) Date: 11/24/22  Attempted to reach the patient regarding the most recent Inpatient/ED visit.  Follow Up Plan: Additional outreach attempts will be made to reach the patient to complete the Transitions of Care (Post Inpatient/ED visit) call.   Signature Robyne Peers, RN

## 2022-11-25 ENCOUNTER — Ambulatory Visit
Admission: RE | Admit: 2022-11-25 | Discharge: 2022-11-25 | Disposition: A | Discharge status: Home / Self Care | Payer: Medicaid Other | Source: Ambulatory Visit | Attending: Radiation Oncology | Admitting: Radiation Oncology

## 2022-11-25 ENCOUNTER — Other Ambulatory Visit: Payer: Self-pay

## 2022-11-25 ENCOUNTER — Ambulatory Visit: Payer: Medicaid Other

## 2022-11-25 ENCOUNTER — Telehealth: Payer: Self-pay

## 2022-11-25 DIAGNOSIS — C50411 Malignant neoplasm of upper-outer quadrant of right female breast: Secondary | ICD-10-CM | POA: Diagnosis not present

## 2022-11-25 LAB — RAD ONC ARIA SESSION SUMMARY
Course Elapsed Days: 48
Plan Fractions Treated to Date: 3
Plan Prescribed Dose Per Fraction: 2 Gy
Plan Total Fractions Prescribed: 6
Plan Total Prescribed Dose: 12 Gy
Reference Point Dosage Given to Date: 6 Gy
Reference Point Session Dosage Given: 2 Gy
Session Number: 18

## 2022-11-25 NOTE — Transitions of Care (Post Inpatient/ED Visit) (Signed)
11/25/2022  Name: Nicole Browning MRN: [id number] DOB: 1970-09-12  Today's TOC FU Call Status: Today's TOC FU Call Status:: Successful TOC FU Call Completed Unsuccessful Call (1st Attempt) Date: 11/24/22 [facility] FU Call Complete Date: 11/25/22 Patient's Name and Date of Birth confirmed.  Transition Care Management Follow-up Telephone Call Date of Discharge: 11/20/22 Discharge Facility: [facility]) Type of Discharge: Inpatient Admission Primary Inpatient Discharge Diagnosis:: intra-abdominal abscess How have you been since you were released from the hospital?: Better Any questions or concerns?: No  Items Reviewed: Did you receive and understand the discharge instructions provided?: Yes Medications obtained,verified, and reconciled?: Partial Review Completed Reason for Partial Mediation Review: She said she has all of her medications and did not have any questions about the med regime and did not need to review the llist. Any new allergies since your discharge?: No Dietary orders reviewed?: Yes Type of Diet Ordered:: heart healthy, low sodium Do you have support at home?: Yes People in Home: child(ren), adult Name of Support/Comfort Primary Source: daughter  Medications Reviewed Today: Medications Reviewed Today   Medications were not reviewed in this encounter     Home Care and Equipment/Supplies: Were Home Health Services Ordered?: No Any new equipment or medical supplies ordered?: No  Functional Questionnaire: Do you need assistance with bathing/showering or dressing?: No Do you need assistance with meal preparation?: No Do you need assistance with eating?: No Do you have difficulty maintaining continence: No Do you need assistance with getting out of bed/getting out of a chair/moving?: No Do you have difficulty managing or taking your medications?: No  Follow up appointments reviewed: PCP Follow-up appointment confirmed?: Yes Date of PCP follow-up appointment?:  12/04/22 Follow-up Provider: Efrain Sella, NP  The patient will need to re-establish care after HFU appt. Specialist Hospital Follow-up appointment confirmed?: Yes Date of Specialist follow-up appointment?: 12/05/22 Follow-Up Specialty Provider:: [facility]. 12/25/2022-oncology; 02/17/2023 - surgery.  multiple radiation appts have also beed scheduled. Do you need transportation to your follow-up appointment?: No Do you understand care options if your condition(s) worsen?: Yes-patient verbalized understanding    SIGNATURE Robyne Peers, RN

## 2022-11-26 ENCOUNTER — Ambulatory Visit
Admission: RE | Admit: 2022-11-26 | Discharge: 2022-11-26 | Disposition: A | Discharge status: Home / Self Care | Payer: Medicaid Other | Source: Ambulatory Visit | Attending: Radiation Oncology | Admitting: Radiation Oncology

## 2022-11-26 ENCOUNTER — Other Ambulatory Visit: Payer: Self-pay

## 2022-11-26 DIAGNOSIS — C50411 Malignant neoplasm of upper-outer quadrant of right female breast: Secondary | ICD-10-CM | POA: Diagnosis not present

## 2022-11-26 LAB — RAD ONC ARIA SESSION SUMMARY
Course Elapsed Days: 49
Plan Fractions Treated to Date: 4
Plan Prescribed Dose Per Fraction: 2 Gy
Plan Total Fractions Prescribed: 6
Plan Total Prescribed Dose: 12 Gy
Reference Point Dosage Given to Date: 8 Gy
Reference Point Session Dosage Given: 2 Gy
Session Number: 19

## 2022-11-27 ENCOUNTER — Ambulatory Visit
Admission: RE | Admit: 2022-11-27 | Discharge: 2022-11-27 | Disposition: A | Discharge status: Home / Self Care | Payer: Medicaid Other | Source: Ambulatory Visit | Attending: Radiation Oncology | Admitting: Radiation Oncology

## 2022-11-27 ENCOUNTER — Other Ambulatory Visit: Payer: Self-pay

## 2022-11-27 DIAGNOSIS — C50411 Malignant neoplasm of upper-outer quadrant of right female breast: Secondary | ICD-10-CM | POA: Diagnosis not present

## 2022-11-27 LAB — RAD ONC ARIA SESSION SUMMARY
Course Elapsed Days: 50
Plan Fractions Treated to Date: 5
Plan Prescribed Dose Per Fraction: 2 Gy
Plan Total Fractions Prescribed: 6
Plan Total Prescribed Dose: 12 Gy
Reference Point Dosage Given to Date: 10 Gy
Reference Point Session Dosage Given: 2 Gy
Session Number: 20

## 2022-11-28 ENCOUNTER — Other Ambulatory Visit: Payer: Self-pay

## 2022-11-28 ENCOUNTER — Ambulatory Visit
Admission: RE | Admit: 2022-11-28 | Discharge: 2022-11-28 | Disposition: A | Discharge status: Home / Self Care | Payer: Medicaid Other | Source: Ambulatory Visit | Attending: Radiation Oncology | Admitting: Radiation Oncology

## 2022-11-28 DIAGNOSIS — C50411 Malignant neoplasm of upper-outer quadrant of right female breast: Secondary | ICD-10-CM | POA: Diagnosis not present

## 2022-11-28 LAB — RAD ONC ARIA SESSION SUMMARY
Course Elapsed Days: 51
Plan Fractions Treated to Date: 6
Plan Prescribed Dose Per Fraction: 2 Gy
Plan Total Fractions Prescribed: 6
Plan Total Prescribed Dose: 12 Gy
Reference Point Dosage Given to Date: 12 Gy
Reference Point Session Dosage Given: 2 Gy
Session Number: 21

## 2022-12-01 ENCOUNTER — Encounter: Payer: Self-pay | Admitting: *Deleted

## 2022-12-01 NOTE — Radiation Completion Notes (Signed)
Patient Name: Nicole Browning, BONNET MRN: [id number] Date of Birth: Aug 31, 1970 Referring Physician: Almond Lint, M.D. Date of Service: 2022-12-01 Radiation Oncologist: Arnette Schaumann, M.D. 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 Staging on 2022-08-28: Malignant neoplasm of upper-outer quadrant of right breast in female, estrogen receptor positive (HCC) T=cT2, N=cN0, M=cM0 Intent: Curative     ==========DELIVERED PLANS==========  First Treatment Date: 2022-10-08 - Last Treatment Date: 2022-11-28   Plan Name: Breast_R Site: Breast, Right Technique: 3D Mode: Photon Dose Per Fraction: 2.67 Gy Prescribed Dose (Delivered / Prescribed): 40.05 Gy / 40.05 Gy Prescribed Fxs (Delivered / Prescribed): 15 / 15   Plan Name: Breast_R_Bst Site: Breast, Right Technique: Electron Mode: Electron Dose Per Fraction: 2 Gy Prescribed Dose (Delivered / Prescribed): 12 Gy / 12 Gy Prescribed Fxs (Delivered / Prescribed): 6 / 6     ==========ON TREATMENT VISIT DATES========== 2022-10-14, 2022-10-22, 2022-10-29, 2022-11-11, 2022-11-25     ==========UPCOMING VISITS==========       ==========APPENDIX - ON TREATMENT VISIT NOTES==========   See weekly On Treatment Notes in Epic for details.

## 2022-12-02 ENCOUNTER — Encounter: Payer: Self-pay | Admitting: *Deleted

## 2022-12-04 ENCOUNTER — Ambulatory Visit (INDEPENDENT_AMBULATORY_CARE_PROVIDER_SITE_OTHER): Payer: Medicaid Other | Admitting: Primary Care

## 2022-12-05 ENCOUNTER — Inpatient Hospital Stay: Payer: Medicaid Other | Admitting: Internal Medicine

## 2022-12-05 ENCOUNTER — Telehealth: Payer: Self-pay

## 2022-12-05 NOTE — Telephone Encounter (Signed)
Left message and sent mychart message asking patient to call providers office. Need to rechedule due to [facility] Follow up 12/05/22.

## 2022-12-17 ENCOUNTER — Ambulatory Visit (INDEPENDENT_AMBULATORY_CARE_PROVIDER_SITE_OTHER): Payer: Medicaid Other | Admitting: Primary Care

## 2022-12-22 ENCOUNTER — Encounter: Payer: Medicaid Other | Admitting: Gastroenterology

## 2022-12-23 ENCOUNTER — Encounter: Payer: Self-pay | Admitting: Radiation Oncology

## 2022-12-25 ENCOUNTER — Ambulatory Visit: Payer: Self-pay | Admitting: Psychology

## 2022-12-25 ENCOUNTER — Telehealth: Payer: Self-pay

## 2022-12-25 ENCOUNTER — Ambulatory Visit
Admit: 2022-12-25 | Discharge: 2022-12-25 | Disposition: A | Discharge status: Home / Self Care | Payer: Medicaid Other | Attending: Radiation Oncology | Admitting: Radiation Oncology

## 2022-12-25 ENCOUNTER — Inpatient Hospital Stay: Payer: Medicaid Other

## 2022-12-25 ENCOUNTER — Telehealth: Payer: Self-pay | Admitting: *Deleted

## 2022-12-25 HISTORY — DX: Personal history of irradiation: Z92.3

## 2022-12-25 NOTE — Telephone Encounter (Signed)
Patient stated that she forgot about her follow up appointment today and wanted to reschedule.

## 2022-12-25 NOTE — Progress Notes (Signed)
[facility] CSW Progress Note Patient was schedule to see CSW following Rad-Onc appointment to complete Holiday Program "Wish-List". Patient did not attend Rad-Onc appointment and did not notify CSW. CSW will attempt again to get patient scheduled to complete the list.   Marguerita Merles, LCSWA Clinical Social Worker [facility]

## 2022-12-25 NOTE — Telephone Encounter (Signed)
RETURNED PATIENT'S PHONE CALL, PATIENT WISHED TO RESCHEDULE TODAY'S FU APPT. WITH DR. KINARD, SPOKE WITH PATIENT AND SHE AGREED TO COME ON 12-29-22 @ 11 AM, APPT. BOOKED

## 2022-12-26 ENCOUNTER — Telehealth: Payer: Self-pay | Admitting: *Deleted

## 2022-12-26 ENCOUNTER — Ambulatory Visit: Payer: Medicaid Other

## 2022-12-26 NOTE — Progress Notes (Unsigned)
Pt's name and DOB verified at the beginning of the pre-visit wit 2 identifiers  Pt denies any difficulty with ambulating,sitting, laying down or rolling side to side  Gave both [facility] main # and MD on call # prior to instructions.   No egg or soy allergy known to patient   No issues known to pt with past sedation with any surgeries or procedures  Pt denies having issues being intubated  Patient denies ever being intubated  Pt has no issues moving head neck or swallowing  No FH of Malignant Hyperthermia  Pt is not on diet pills or shots  Pt is not on home 02   Pt is not on blood thinners   Pt denies issues with constipation   Pt has frequent issues with constipation RN instructed pt to use Miralax per bottles instructions a week before prep days. Pt states they will  Pt is not on dialysis  Pt denise any abnormal heart rhythms   Pt denies any upcoming cardiac testing  Pt encouraged to use to use Singlecare or Goodrx to reduce cost   Patient's chart reviewed by Cathlyn Parsons CNRA prior to pre-visit and patient appropriate for the [facility].  Pre-visit completed and red dot placed by patient's name on their procedure day (on provider's schedule).  .  Visit by phone  Pt states weight is   Instructed pt why it is important to and  to call if they have any changes in health or new medications. Directed them to the # given and on instructions.     Instructions reviewed with pt and pt states understanding. Instructed to review again prior to procedure. Pt states they will.   Instructions sent by mail with coupon and by my chart

## 2022-12-26 NOTE — Telephone Encounter (Signed)
Attempt to reach pt for pre-visit.  VM full   Will attempt other number in profile   Second attempt to reach pt for pre-vist unsuccessful. LM with facility # for pt to call back. Instructed pt to call # given by end of the day and reschedule the pre-visit  with RN or the scheduled procedure will be canceled.

## 2022-12-29 ENCOUNTER — Encounter (INDEPENDENT_AMBULATORY_CARE_PROVIDER_SITE_OTHER): Payer: Self-pay | Admitting: Primary Care

## 2022-12-29 ENCOUNTER — Telehealth (INDEPENDENT_AMBULATORY_CARE_PROVIDER_SITE_OTHER): Payer: Self-pay | Admitting: Primary Care

## 2022-12-29 ENCOUNTER — Ambulatory Visit
Admission: RE | Admit: 2022-12-29 | Discharge: 2022-12-29 | Disposition: A | Discharge status: Home / Self Care | Payer: Medicaid Other | Source: Ambulatory Visit | Attending: Radiation Oncology | Admitting: Radiation Oncology

## 2022-12-29 ENCOUNTER — Telehealth (INDEPENDENT_AMBULATORY_CARE_PROVIDER_SITE_OTHER): Payer: Medicaid Other | Admitting: Primary Care

## 2022-12-29 DIAGNOSIS — R21 Rash and other nonspecific skin eruption: Secondary | ICD-10-CM | POA: Diagnosis not present

## 2022-12-29 NOTE — Telephone Encounter (Signed)
Spoke to pt about making apt virtual.

## 2022-12-29 NOTE — Progress Notes (Signed)
[facility]  Virtual Visit  I connected with Nicole Browning, on 12/29/2022 at 11:56 AM through an audio and video application and verified that I am speaking with the correct person using two identifiers.   Consent: I discussed the limitations, risks, security and privacy concerns of performing an evaluation and management service by telephone and the availability of in person appointments. I also discussed with the patient that there may be a patient responsible charge related to this service. The patient expressed understanding and agreed to proceed.   Location of Patient: Home  Location of Provider: [facility]   Persons participating in Telemedicine visit: Phineas Real,  NP   History of Present Illness: Nicole Browning  Rash This is a recurrent problem. The current episode started more than 1 month ago. The problem has been gradually worsening since onset. The affected locations include the face. The rash is characterized by itchiness, peeling and burning. She was exposed to nothing. Past treatments include moisturizer and anti-itch cream. The treatment provided no relief.       Past Medical History:  Diagnosis Date   AKI (acute kidney injury) (HCC) 10/03/2020   Anemia    Diverticulitis 12/13/2020   GERD (gastroesophageal reflux disease)    History of radiation therapy    Right breast-10/08/22-11/28/22- Dr. Antony Blackbird   IBS (irritable bowel syndrome)    Perirectal abscess    Protein-calorie malnutrition (HCC) 02/29/2016   Tobacco use    UC (ulcerative colitis) (HCC)    Allergies  Allergen Reactions   Bactrim [Sulfamethoxazole-Trimethoprim] Hives    Immediate body wide hives   Percocet [Oxycodone-Acetaminophen] Nausea Only   Valium [Diazepam] Nausea Only    Current Outpatient Medications on File Prior to Visit  Medication Sig Dispense Refill   acetaminophen-codeine  (TYLENOL #3) 300-30 MG tablet Take 1 tablet by mouth every 6 (six) hours as needed (pain). 12 tablet 0   amoxicillin-clavulanate (AUGMENTIN) 875-125 MG tablet Take 1 tablet by mouth every 12 (twelve) hours. 80 tablet 0   bismuth subsalicylate (PEPTO BISMOL) 262 MG/15ML suspension Take 30 mLs by mouth every 6 (six) hours as needed for indigestion.     gabapentin (NEURONTIN) 100 MG capsule Take 1 capsule (100 mg total) by mouth 3 (three) times daily. 60 capsule 0   Multiple Vitamins-Minerals (WOMENS MULTIVITAMIN PO) Take 1 tablet by mouth daily.     POTASSIUM PO Take 1 tablet by mouth daily.     traMADol (ULTRAM) 50 MG tablet Take 50 mg by mouth every 6 (six) hours as needed.     No current facility-administered medications on file prior to visit.    Observations/Objective: See picture   Assessment and Plan: Diagnoses and all orders for this visit:  Rash and nonspecific skin eruption -     Ambulatory referral to Dermatology     Follow Up Instructions:   I discussed the assessment and treatment plan with the patient. The patient was provided an opportunity to ask questions and all were answered. The patient agreed with the plan and demonstrated an understanding of the instructions.   The patient was advised to call back or seek an in-person evaluation if the symptoms worsen or if the condition fails to improve as anticipated.     I provided 20 minutes total of non-face-to-face time during this encounter including median intraservice time, reviewing previous notes, investigations, ordering medications, medical decision making, coordinating care and patient verbalized understanding at the  end of the visit.    This note has been created with Education officer, environmental. Any transcriptional errors are unintentional.   Grayce Sessions, NP 12/29/2022, 11:56 AM

## 2022-12-31 ENCOUNTER — Telehealth (INDEPENDENT_AMBULATORY_CARE_PROVIDER_SITE_OTHER): Payer: Self-pay | Admitting: Primary Care

## 2022-12-31 ENCOUNTER — Telehealth: Payer: Self-pay

## 2022-12-31 NOTE — Telephone Encounter (Signed)
Patient has called and states she had a virtual visit with Gwinda Passe, PCP, on Monday 12/29/2022 and PCP was supposed to send over a referral to Dermatology for her and she wanted to check on this status of this. Please follow back up with the patient @ #  [phone].

## 2022-12-31 NOTE — Telephone Encounter (Signed)
[facility] CSW Progress Note  Clinical Child psychotherapist  returned patients missed call.  Patient called to reschedule discussion for [facility]. CSW will call patient on Friday 11/01    Marguerita Merles, [place] Clinical Social Worker [facility]

## 2023-01-01 ENCOUNTER — Ambulatory Visit
Admission: RE | Admit: 2023-01-01 | Discharge: 2023-01-01 | Disposition: A | Discharge status: Home / Self Care | Payer: Medicaid Other | Source: Ambulatory Visit | Attending: Radiation Oncology | Admitting: Radiation Oncology

## 2023-01-01 ENCOUNTER — Telehealth: Payer: Self-pay | Admitting: *Deleted

## 2023-01-01 ENCOUNTER — Encounter: Payer: Self-pay | Admitting: *Deleted

## 2023-01-01 ENCOUNTER — Telehealth: Payer: Self-pay | Admitting: Radiation Oncology

## 2023-01-01 NOTE — Telephone Encounter (Signed)
Will forward to provider  

## 2023-01-01 NOTE — Telephone Encounter (Signed)
10/31 @ 2:45 pm Received voicemail from patient to reschedule her appointment from today.  Email forward to [employer] and copied [place], so they are aware.

## 2023-01-01 NOTE — Telephone Encounter (Signed)
RETURNED PATIENT'S PHONE CALL, SPOKE WITH PATIENT. ?

## 2023-01-02 ENCOUNTER — Telehealth: Payer: Self-pay

## 2023-01-02 ENCOUNTER — Encounter (INDEPENDENT_AMBULATORY_CARE_PROVIDER_SITE_OTHER): Payer: Self-pay | Admitting: Primary Care

## 2023-01-02 NOTE — Telephone Encounter (Signed)
[facility] CSW Progress Note  Clinical Child psychotherapist contacted patient by phone as planned to complete [employer] Giving Tree List. Patient did not answer phone calls placed to both listed numbers. CSW left one vm with direct information for patient to call back.  Marguerita Merles, LCSWA Clinical Social Worker [facility]

## 2023-01-05 ENCOUNTER — Telehealth: Payer: Self-pay

## 2023-01-05 NOTE — Telephone Encounter (Signed)
Attempted to call patient to make sure she was planning on coming in tomorrow for her procedure but there was no answer, left message for her to call us back.

## 2023-01-06 ENCOUNTER — Encounter: Payer: Self-pay | Admitting: Gastroenterology

## 2023-01-06 ENCOUNTER — Encounter: Payer: Medicaid Other | Admitting: Gastroenterology

## 2023-01-12 ENCOUNTER — Ambulatory Visit: Payer: Medicaid Other | Admitting: Radiation Oncology

## 2023-01-13 ENCOUNTER — Ambulatory Visit (INDEPENDENT_AMBULATORY_CARE_PROVIDER_SITE_OTHER): Payer: Medicaid Other | Admitting: Primary Care

## 2023-01-14 ENCOUNTER — Telehealth (INDEPENDENT_AMBULATORY_CARE_PROVIDER_SITE_OTHER): Payer: Self-pay | Admitting: Primary Care

## 2023-01-14 ENCOUNTER — Telehealth: Payer: Self-pay | Admitting: *Deleted

## 2023-01-14 NOTE — Telephone Encounter (Signed)
Patient has called in regards to her Dermatology referral that was sent for her. She states the office it was sent to can not get her in until January and patient would like the referral sent somewhere else, to another Dermatology office, that can get her in sooner. She states she does not want to go anywhere with her breaking out like she is and wants this referral sent somewhere else. Please advise and follow back up with the patient @ # [phone].

## 2023-01-14 NOTE — Telephone Encounter (Signed)
RETURNED PATIENT'S PHONE CALL, LVM FOR A RETURN CALL 

## 2023-01-14 NOTE — Telephone Encounter (Signed)
Will forward to Nora  

## 2023-01-15 ENCOUNTER — Ambulatory Visit: Payer: Medicaid Other | Admitting: Psychology

## 2023-01-15 DIAGNOSIS — Z09 Encounter for follow-up examination after completed treatment for conditions other than malignant neoplasm: Secondary | ICD-10-CM

## 2023-01-15 NOTE — Telephone Encounter (Signed)
Will forward to Nora  

## 2023-01-15 NOTE — Telephone Encounter (Signed)
Patient called in to f/u on the referral for another Dermatologist.

## 2023-01-19 ENCOUNTER — Encounter (INDEPENDENT_AMBULATORY_CARE_PROVIDER_SITE_OTHER): Payer: Self-pay

## 2023-01-19 NOTE — Telephone Encounter (Signed)
Sent pt MyChart message with referral information.

## 2023-01-21 ENCOUNTER — Ambulatory Visit: Payer: Medicaid Other | Admitting: Psychology

## 2023-01-21 DIAGNOSIS — Z09 Encounter for follow-up examination after completed treatment for conditions other than malignant neoplasm: Secondary | ICD-10-CM

## 2023-01-23 ENCOUNTER — Telehealth: Payer: Self-pay | Admitting: Hematology and Oncology

## 2023-01-23 NOTE — Telephone Encounter (Signed)
Spoke with patient confirming upcoming appointment  
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
## 2023-01-26 ENCOUNTER — Telehealth: Payer: Self-pay

## 2023-01-26 NOTE — Telephone Encounter (Signed)
[facility] CSW Progress Note  Clinical Social Worker  attempted to reach patient  to return patient's vm left on 11/20. Patient inquired about [facility] - [employer]. CSW unable to leave vm for patient confirming that CSW / Patient discussed [facility] - [employer] Assistance Program on 11/06. CSW submitted her list on her behalf.    Marguerita Merles, LCSWA Clinical Social Worker [facility]

## 2023-01-28 ENCOUNTER — Telehealth: Payer: Self-pay | Admitting: *Deleted

## 2023-01-28 ENCOUNTER — Ambulatory Visit: Payer: Medicaid Other | Admitting: Dermatology

## 2023-01-28 ENCOUNTER — Ambulatory Visit: Payer: Medicaid Other

## 2023-01-28 ENCOUNTER — Encounter: Payer: Self-pay | Admitting: Dermatology

## 2023-01-28 DIAGNOSIS — Z7189 Other specified counseling: Secondary | ICD-10-CM

## 2023-01-28 DIAGNOSIS — L905 Scar conditions and fibrosis of skin: Secondary | ICD-10-CM

## 2023-01-28 DIAGNOSIS — L219 Seborrheic dermatitis, unspecified: Secondary | ICD-10-CM

## 2023-01-28 MED ORDER — HYDROCORTISONE 2.5 % EX CREA: TOPICAL_CREAM | CUTANEOUS | 3 refills | Status: DC

## 2023-01-28 MED ORDER — KETOCONAZOLE 2 % EX CREA: TOPICAL_CREAM | CUTANEOUS | 3 refills | Status: DC

## 2023-01-28 NOTE — Telephone Encounter (Signed)
Attempt to reach pt for pre-visit. No answer   Will attempt other number in profile   2nd attemt with other # got a busy signal  Attempted the first # again and was able to leave message with instructions to call and reschedule PV with Rn or procedure will be canceled.

## 2023-01-28 NOTE — Patient Instructions (Addendum)
Start Ketoconazole 2% cream: Apply twice daily to affected areas on face until flaking and scale has stopped then apply once daily.  Start Hydrocortisone 2.5% cream: Apply twice daily to affected areas on face until flaking and scale has stopped. Resume when recurs.  Topical steroids (such as triamcinolone, fluocinolone, fluocinonide, mometasone, clobetasol, halobetasol, betamethasone, hydrocortisone) can cause thinning and lightening of the skin if they are used for too long in the same area. Your physician has selected the right strength medicine for your problem and area affected on the body. Please use your medication only as directed by your physician to prevent side effects.    Seborrheic Dermatitis is a chronic persistent rash characterized by pinkness and scaling most commonly of the mid face but also can occur on the scalp (dandruff), ears; mid chest, mid back and groin.  It tends to be exacerbated by stress and cooler weather.  People who have neurologic disease may experience new onset or exacerbation of existing seborrheic dermatitis.  The condition is not curable but treatable and can be controlled   For acne scarring/large pores: Recommend  Normand Sloop, M.D. [address], [address] [place], [place] [address] [phone]  [email or web address]    Due to recent changes in healthcare laws, you may see results of your pathology and/or laboratory studies on MyChart before the doctors have had a chance to review them. We understand that in some cases there may be results that are confusing or concerning to you. Please understand that not all results are received at the same time and often the doctors may need to interpret multiple results in order to provide you with the best plan of care or course of treatment. Therefore, we ask that you please give [place] 2 business days to thoroughly review all your results before contacting the office for clarification. Should we see a  critical lab result, you will be contacted sooner.   If You Need Anything After Your Visit  If you have any questions or concerns for your doctor, please call our main line at [phone] and press option 4 to reach your doctor's medical assistant. If no one answers, please leave a voicemail as directed and we will return your call as soon as possible. Messages left after 4 pm will be answered the following business day.   You may also send [place] a message via MyChart. We typically respond to MyChart messages within 1-2 business days.  For prescription refills, please ask your pharmacy to contact our office. Our fax number is [phone].  If you have an urgent issue when the clinic is closed that cannot wait until the next business day, you can page your doctor at the number below.    Please note that while we do our best to be available for urgent issues outside of office hours, we are not available 24/7.   If you have an urgent issue and are unable to reach [place], you may choose to seek medical care at your doctor's office, retail clinic, urgent care center, or emergency room.  If you have a medical emergency, please immediately call 911 or go to the emergency department.  Pager Numbers  - Dr. Gwen Pounds: [phone]  - Dr. Roseanne Reno: [phone]  - Dr. Katrinka Blazing: [phone]   In the event of inclement weather, please call our main line at [phone] for an update on the status of any delays or closures.  Dermatology Medication Tips: Please keep the boxes that topical medications come in in order to  help keep track of the instructions about where and how to use these. Pharmacies typically print the medication instructions only on the boxes and not directly on the medication tubes.   If your medication is too expensive, please contact our office at [phone] option 4 or send [place] a message through MyChart.   We are unable to tell what your co-pay for medications will be in advance as  this is different depending on your insurance coverage. However, we may be able to find a substitute medication at lower cost or fill out paperwork to get insurance to cover a needed medication.   If a prior authorization is required to get your medication covered by your insurance company, please allow [place] 1-2 business days to complete this process.  Drug prices often vary depending on where the prescription is filled and some pharmacies may offer cheaper prices.  The website www.goodrx.com contains coupons for medications through different pharmacies. The prices here do not account for what the cost may be with help from insurance (it may be cheaper with your insurance), but the website can give you the price if you did not use any insurance.  - You can print the associated coupon and take it with your prescription to the pharmacy.  - You may also stop by our office during regular business hours and pick up a GoodRx coupon card.  - If you need your prescription sent electronically to a different pharmacy, notify our office through [facility] or by phone at [phone] option 4.     Si Usted Necesita Algo Despus de Su Visita  Tambin puede enviarnos un mensaje a travs de Clinical cytogeneticist. Por lo general respondemos a los mensajes de MyChart en el transcurso de 1 a 2 das hbiles.  Para renovar recetas, por favor pida a su farmacia que se ponga en contacto con nuestra oficina. Annie Sable de fax es [place] [phone].  Si tiene un asunto urgente cuando la clnica est cerrada y que no puede esperar hasta el siguiente da hbil, puede llamar/localizar a su doctor(a) al nmero que aparece a continuacin.   Por favor, tenga en cuenta que aunque hacemos todo lo posible para estar disponibles para asuntos urgentes fuera del horario de [place], no estamos disponibles las 24 horas del da, los 7 [address]  [address] de la [place].   Si tiene un problema urgente y no puede comunicarse con nosotros, puede optar por  buscar atencin mdica  en el consultorio de su doctor(a), en una clnica privada, en un centro de atencin urgente o en una sala de emergencias.  Si tiene Engineer, drilling, por favor llame inmediatamente al 911 o vaya a la sala de emergencias.  Nmeros de bper  - Dr. Gwen Pounds: [phone]  - Dra. Roseanne Reno: [phone]  - Dr. Katrinka Blazing: [phone]   En caso de inclemencias del tiempo, por favor llame a Lacy Duverney principal al [phone] para una actualizacin sobre el [place] de cualquier retraso o cierre.  Consejos para la medicacin en dermatologa: Por favor, guarde las cajas en las que vienen los medicamentos de uso tpico para ayudarle a seguir las instrucciones sobre dnde y cmo usarlos. Las farmacias generalmente imprimen las instrucciones del medicamento slo en las cajas y no directamente en los tubos del [place].   Si su medicamento es muy caro, por favor, pngase en contacto con Rolm Gala llamando al [phone] y presione la opcin 4 o envenos un mensaje a travs de Clinical cytogeneticist.   No podemos decirle cul ser su copago  por los medicamentos por adelantado ya que esto es diferente dependiendo de la cobertura de su seguro. Sin embargo, es posible que podamos encontrar un medicamento sustituto a Audiological scientist un formulario para que el seguro cubra el medicamento que se considera necesario.   Si se requiere una autorizacin previa para que su compaa de seguros [place] su medicamento, por favor permtanos de 1 a 2 das hbiles para completar [address]  Los precios de los medicamentos varan con frecuencia dependiendo del Environmental consultant de dnde se surte la receta y alguna farmacias pueden ofrecer precios ms baratos.  El sitio web www.goodrx.com tiene cupones para medicamentos de Health and safety inspector. Los precios aqu no tienen en cuenta lo que podra costar con la ayuda del seguro (puede ser ms barato con su seguro), pero el sitio web puede darle el precio si no  utiliz Tourist information centre manager.  - Puede imprimir el cupn correspondiente y llevarlo con su receta a la farmacia.  - Tambin puede pasar por nuestra oficina durante el horario de atencin regular y Education officer, museum una tarjeta de cupones de GoodRx.  - Si necesita que su receta se enve electrnicamente a una farmacia diferente, informe a nuestra oficina a travs de MyChart de [place] o por telfono llamando al [phone] y presione la opcin 4.

## 2023-01-28 NOTE — Progress Notes (Signed)
   New Patient Visit   Subjective  Nicole Browning is a 52 y.o. female who presents for the following: Rash on face.  Recurrent. Itches, peels, burns when flared. States it is not flaring today. No changes with cosmetics, cleansers, laundry detergent. Was using cream for radiation on her face because it helped on her chest. Does not remember the name of the cream.  Recently finished radiation treatment for breast cancer of right breast.    The following portions of the chart were reviewed this encounter and updated as appropriate: medications, allergies, medical history  Review of Systems:  No other skin or systemic complaints except as noted in HPI or Assessment and Plan.  Objective  Well appearing patient in no apparent distress; mood and affect are within normal limits.  A focused examination was performed of the following areas: Face  Relevant exam findings are noted in the Assessment and Plan.          Assessment & Plan     SEBORRHEIC DERMATITIS Exam: hyperpigmented erythematous patches with greasy scale on glabella and perinasal skin  Chronic and persistent condition with duration or expected duration over one year. Condition is symptomatic/ bothersome to patient. Not currently at goal.   Seborrheic Dermatitis is a chronic persistent rash characterized by pinkness and scaling most commonly of the mid face but also can occur on the scalp (dandruff), ears; mid chest, mid back and groin.  It tends to be exacerbated by stress and cooler weather.  People who have neurologic disease may experience new onset or exacerbation of existing seborrheic dermatitis.  The condition is not curable but treatable and can be controlled.  Treatment Plan: Start Ketoconazole 2% cream: Apply twice daily to affected areas on face until flaking and scale has stopped then apply once daily.  Start Hydrocortisone 2.5% cream: Apply twice daily to affected areas on face until flaking and scale has  stopped. Resume when recurs.  Topical steroids (such as triamcinolone, fluocinolone, fluocinonide, mometasone, clobetasol, halobetasol, betamethasone, hydrocortisone) can cause thinning and lightening of the skin if they are used for too long in the same area. Your physician has selected the right strength medicine for your problem and area affected on the body. Please use your medication only as directed by your physician to prevent side effects.   Acne Scarring  Exam: pitted scars, large open pores at face  Treatment: Recommend consult with Dr. Geraldine Solar Cox.   Return in about 2 months (around 03/30/2023) for Seborrheic Dermatitis Follow Up.  I, Lawson Radar, CMA, am acting as scribe for Elie Goody, MD.   Documentation: I have reviewed the above documentation for accuracy and completeness, and I agree with the above.  Elie Goody, MD

## 2023-02-04 NOTE — Progress Notes (Signed)
  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   Stage 1B (T2, N0, M0) Right Breast UOQ, Invasive Ductal Carcinoma, ER+ / PR+ / Her2-, Grade 1     Indication for treatment: Curative        Radiation treatment dates: 10/08/22 through 11/28/22   Site/dose:    1) Right breast - 40.05 Gy delivered in 15 Fx at 2.67 Gy/Fx 2) Right breast boost - 12 Gy delivered in 6 Fx at 2 Gy/Fx  Technique/Mode: 3D / Photon   Beams/energy: 6X-FFF  Narrative: The patient tolerated radiation treatment relatively well. During her final weekly treatment check on 11/25/22, the patient endorsed sharp, shooting pains, fatigue, hyperpigmentation, and a pulling sensation to the right axilla. Physical exam performed on that same date showed significant hyperpigmentation changes to the right breast area, with some dry skin noted throughout the area  No moist desquamation was appreciated.  She was given radiaplex to apply to the effected areas.    Plan: The patient has completed radiation treatment. The patient will return to radiation oncology clinic for routine followup in one month. I advised them to call or return sooner if they have any questions or concerns related to their recovery or treatment.  -----------------------------------  Nicole Lade, PhD, MD  This document serves as a record of services personally performed by Nicole Blackbird, MD. It was created on his behalf by Nicole Browning, a trained medical scribe. The creation of this record is based on the scribe's personal observations and the provider's statements to them. This document has  been checked and approved by the attending provider.
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
## 2023-02-05 ENCOUNTER — Ambulatory Visit
Admission: RE | Admit: 2023-02-05 | Discharge: 2023-02-05 | Disposition: A | Discharge status: Home / Self Care | Payer: Medicaid Other | Source: Ambulatory Visit | Attending: Radiation Oncology | Admitting: Radiation Oncology

## 2023-02-05 ENCOUNTER — Other Ambulatory Visit: Payer: Self-pay

## 2023-02-05 ENCOUNTER — Encounter: Payer: Self-pay | Admitting: Radiation Oncology

## 2023-02-05 VITALS — BP 108/70 | HR 84 | Temp 97.6°F | Resp 20 | Ht 66.5 in | Wt 120.2 lb

## 2023-02-05 DIAGNOSIS — Z170 Estrogen receptor positive status [ER+]: Secondary | ICD-10-CM | POA: Insufficient documentation

## 2023-02-05 DIAGNOSIS — Z923 Personal history of irradiation: Secondary | ICD-10-CM | POA: Insufficient documentation

## 2023-02-05 DIAGNOSIS — C50411 Malignant neoplasm of upper-outer quadrant of right female breast: Secondary | ICD-10-CM | POA: Diagnosis present

## 2023-02-05 NOTE — Progress Notes (Signed)
Nicole Browning is here today for follow up post radiation to the breast.   Breast Side:Right   They completed their radiation on: 11/28/22   Does the patient complain of any of the following: Post radiation skin issues:  Patient reports having a thick white discharge from right breast. Breast Tenderness: Reports intermittent pains to right breast at times. Breast Swelling: No Lymphadema: No Range of Motion limitations: Reports issues with lifting after using arm for extended periods of time.  Fatigue post radiation: Yes Appetite good/fair/poor: Good   Additional comments if applicable:   BP 108/70 (BP Location: Right Arm, Patient Position: Sitting, Cuff Size: Normal)   Pulse 84   Temp 97.6 F (36.4 C)   Resp 20   Ht 5' 6.5" (1.689 m)   Wt 120 lb 3.2 oz (54.5 kg)   SpO2 100%   BMI 19.11 kg/m

## 2023-02-16 ENCOUNTER — Encounter: Payer: Medicaid Other | Admitting: Gastroenterology

## 2023-02-16 ENCOUNTER — Inpatient Hospital Stay: Payer: Medicaid Other | Admitting: Licensed Clinical Social Worker

## 2023-02-16 ENCOUNTER — Inpatient Hospital Stay: Payer: Medicaid Other | Attending: Hematology and Oncology | Admitting: Hematology and Oncology

## 2023-02-16 NOTE — Progress Notes (Signed)
[facility] CSW Progress Note  Clinical Child psychotherapist contacted patient by phone to follow-up on housing needs per referral from [employer]. Pt reports that she is being evicted by Wednesday this week and she and her daughter are staying with a friend. She was trying to work some, but had limited hours. She is now applying to any job she can physically do. She is also looking for housing. CSW provided empathic listening and shared resources [facility] [employer] for Women to Work program; housing search options from [employer], [employer], [employer]). Unfortunately pt has already received cancer-specific resources and is no longer qualified for others as she has finished active treatment.     Ilhan Debenedetto E Sabriya Yono, LCSW Clinical Social Worker [facility]    Patient is participating in a Managed Medicaid Plan:  Yes

## 2023-02-17 ENCOUNTER — Ambulatory Visit (AMBULATORY_SURGERY_CENTER): Payer: Medicaid Other | Admitting: *Deleted

## 2023-02-17 VITALS — Ht 66.5 in | Wt 125.0 lb

## 2023-02-17 DIAGNOSIS — Z860100 Personal history of colon polyps, unspecified: Secondary | ICD-10-CM

## 2023-02-17 MED ORDER — NA SULFATE-K SULFATE-MG SULF 17.5-3.13-1.6 GM/177ML PO SOLN
1.0000 | Freq: Once | ORAL | 0 refills | Status: AC
Start: 2023-02-17 — End: 2023-02-17

## 2023-02-17 NOTE — Progress Notes (Signed)
Pt's name and DOB verified at the beginning of the pre-visit wit 2 identifiers  Pt denies any difficulty with ambulating,sitting, laying down or rolling side to side  Pt has no issues with ambulation   Pt has no issues moving head neck or swallowing  No egg or soy allergy known to patient   No issues known to pt with past sedation with any surgeries or procedures  Patient denies ever being intubated  No FH of Malignant Hyperthermia  Pt is not on diet pills or shots   Pt is not on home 02   Pt is not on blood thinners    Pt denies issues with constipation    Pt is not on dialysis  Pt denise any abnormal heart rhythms   Pt denies any upcoming cardiac testing  Pt encouraged to use to use Singlecare or Goodrx to reduce cost   Patient's chart reviewed by Cathlyn Parsons CNRA prior to pre-visit and patient appropriate for the [facility].  Pre-visit completed and red dot placed by patient's name on their procedure day (on provider's schedule).  .  Visit by phone    Pt states weight is 125lb   Instructed pt why it is important to and  to call if they have any changes in health or new medications. Directed them to the # given and on instructions.     Instructions reviewed. Pt given both [facility] main # and MD on call # prior to instructions.  Pt states understanding. Instructed to review again prior to procedure. Pt states they will.   Instructions sent by mail with coupon and by My Chart   Coupon sent via text to mobile phone and pt verified they received it

## 2023-03-02 ENCOUNTER — Encounter: Payer: Self-pay | Admitting: *Deleted

## 2023-03-02 NOTE — Progress Notes (Signed)
This RN Navigator reached out to pt to inquire why she has not showed for appt scheduled in December with Dr. Al Pimple to discuss antiestrogen therapy. Pt shared with me she has been going through a lot and is now staying with a friend. She stated she lost her place and now, "sort of homeless". Pt was tearful as we had our conversation but is willing to come in to at least discuss next steps of treatment. I provided patient with my name and number. I will try to get her scheduled again. Pt is agreeable to plan.

## 2023-03-02 NOTE — Progress Notes (Signed)
Due to the fact pt has a lot of social issues going on , she told scheduler she rather not take the appt that has been scheduled for her. She will call us when ready to reschedule w/ Dr. Al Pimple.

## 2023-03-05 ENCOUNTER — Ambulatory Visit (INDEPENDENT_AMBULATORY_CARE_PROVIDER_SITE_OTHER): Payer: Self-pay | Admitting: *Deleted

## 2023-03-05 NOTE — Telephone Encounter (Signed)
  Chief Complaint: anxiety Symptoms: anxious, recent changes in living. Homeless. Reports stress from having dog taken. C/o trouble sleeping , anxiety . No chest pain now but reports episodes at times chest pain, difficulty breathing when gets too anxious.  Frequency: 2 weeks  Pertinent Negatives: Patient denies chest pain now no difficulty breathing no report of harming self or others Disposition: [] ED /[] Urgent Care (no appt availability in office) / [x] Appointment(In office/virtual)/ []  [facility]/ [] Home Care/ [] Refused Recommended Disposition /[] [facility]/ []  Follow-up with PCP Additional Notes:   Appt scheduled tomorrow. Recommended if sx worsen today or return can go to Urgent crisis center or ED if chest pain and difficulty breathing return. Please advise.       Reason for Disposition  MODERATE anxiety (e.g., persistent or frequent anxiety symptoms; interferes with sleep, school, or work)  Answer Assessment - Initial Assessment Questions 1. CONCERN: Did anything happen that prompted you to call today?      Anxiety due to multiple changes in life  2. ANXIETY SYMPTOMS: Can you describe how you (your loved one; patient) have been feeling? (e.g., tense, restless, panicky, anxious, keyed up, overwhelmed, sense of impending doom).      Not sleeping good , can't breath chest pain  3. ONSET: How long have you been feeling this way? (e.g., hours, days, weeks)     2 weeks  4. SEVERITY: How would you rate the level of anxiety? (e.g., 0 - 10; or mild, moderate, severe).     *No Answer* 5. FUNCTIONAL IMPAIRMENT: How have these feelings affected your ability to do daily activities? Have you had more difficulty than usual doing your normal daily activities? (e.g., getting better, same, worse; self-care, school, work, interactions)     *No Answer* 6. HISTORY: Have you felt this way before? Have you ever been diagnosed with an anxiety problem in the past?  (e.g., generalized anxiety disorder, panic attacks, PTSD). If Yes, ask: How was this problem treated? (e.g., medicines, counseling, etc.)     Yes  7. RISK OF HARM - SUICIDAL IDEATION: Do you ever have thoughts of hurting or killing yourself? If Yes, ask:  Do you have these feelings now? Do you have a plan on how you would do this?     na 8. TREATMENT:  What has been done so far to treat this anxiety? (e.g., medicines, relaxation strategies). What has helped?     Talked friends  9. TREATMENT - THERAPIST: Do you have a counselor or therapist? Name?     na 10. POTENTIAL TRIGGERS: Do you drink caffeinated beverages (e.g., coffee, colas, teas), and how much daily? Do you drink alcohol or use any drugs? Have you started any new medicines recently?       Na  11. PATIENT SUPPORT: Who is with you now? Who do you live with? Do you have family or friends who you can talk to?        Friends and staying at friends home patient is  82. OTHER SYMPTOMS: Do you have any other symptoms? (e.g., feeling depressed, trouble concentrating, trouble sleeping, trouble breathing, palpitations or fast heartbeat, chest pain, sweating, nausea, or diarrhea)        13. PREGNANCY: Is there any chance you are pregnant? When was your last menstrual period?       na  Protocols used: Anxiety and Panic Attack-A-AH

## 2023-03-06 ENCOUNTER — Ambulatory Visit (INDEPENDENT_AMBULATORY_CARE_PROVIDER_SITE_OTHER): Payer: Medicaid Other | Admitting: Primary Care

## 2023-03-06 NOTE — Telephone Encounter (Signed)
 Will forward to provider. Pt missed appt scheduled for 03/06/23

## 2023-03-09 NOTE — Telephone Encounter (Signed)
 Reach out to patient and make aware not able to prescribe anything she will need to wait till appointment on 03/16/23. Pt missed appointment on 03/06/23.

## 2023-03-10 ENCOUNTER — Telehealth: Payer: Self-pay | Admitting: Gastroenterology

## 2023-03-10 DIAGNOSIS — Z860100 Personal history of colon polyps, unspecified: Secondary | ICD-10-CM

## 2023-03-10 MED ORDER — NA SULFATE-K SULFATE-MG SULF 17.5-3.13-1.6 GM/177ML PO SOLN
1.0000 | Freq: Once | ORAL | 0 refills | Status: AC
Start: 2023-03-10 — End: 2023-03-10

## 2023-03-10 NOTE — Telephone Encounter (Signed)
 Inbound call from patient stating that the pharmacy does not have prescription for her prep and is requesting we resend the prescription. Patient is scheduled for colonoscopy for tomorrow at 2:00 with Dr. Tomasa Rand. Please advise.

## 2023-03-10 NOTE — Telephone Encounter (Signed)
 Sent Rx for suprep to [facility] on [address]  Called patient to inform her that Rx had been sent.  Pt aware.

## 2023-03-11 ENCOUNTER — Encounter: Payer: Self-pay | Admitting: Gastroenterology

## 2023-03-11 ENCOUNTER — Ambulatory Visit (AMBULATORY_SURGERY_CENTER): Payer: Medicaid Other | Admitting: Gastroenterology

## 2023-03-11 VITALS — BP 147/86 | HR 81 | Temp 98.4°F | Resp 21 | Ht 66.5 in | Wt 125.0 lb

## 2023-03-11 DIAGNOSIS — K6389 Other specified diseases of intestine: Secondary | ICD-10-CM

## 2023-03-11 DIAGNOSIS — K648 Other hemorrhoids: Secondary | ICD-10-CM

## 2023-03-11 DIAGNOSIS — K6289 Other specified diseases of anus and rectum: Secondary | ICD-10-CM

## 2023-03-11 DIAGNOSIS — K529 Noninfective gastroenteritis and colitis, unspecified: Secondary | ICD-10-CM | POA: Diagnosis not present

## 2023-03-11 DIAGNOSIS — K5732 Diverticulitis of large intestine without perforation or abscess without bleeding: Secondary | ICD-10-CM

## 2023-03-11 DIAGNOSIS — K633 Ulcer of intestine: Secondary | ICD-10-CM | POA: Diagnosis not present

## 2023-03-11 DIAGNOSIS — R935 Abnormal findings on diagnostic imaging of other abdominal regions, including retroperitoneum: Secondary | ICD-10-CM

## 2023-03-11 DIAGNOSIS — Z860100 Personal history of colon polyps, unspecified: Secondary | ICD-10-CM

## 2023-03-11 DIAGNOSIS — R933 Abnormal findings on diagnostic imaging of other parts of digestive tract: Secondary | ICD-10-CM

## 2023-03-11 MED ORDER — SODIUM CHLORIDE 0.9 % IV SOLN: 500.0000 mL | Freq: Once | INTRAVENOUS | Status: AC

## 2023-03-11 MED ORDER — AMOXICILLIN-POT CLAVULANATE 875-125 MG PO TABS: 1.0000 | ORAL_TABLET | Freq: Two times a day (BID) | ORAL | 0 refills | Status: AC

## 2023-03-11 NOTE — Progress Notes (Signed)
 Pt's states no medical or surgical changes since previsit or office visit.

## 2023-03-11 NOTE — Progress Notes (Signed)
 1530: Patient assessed by Dr. Tomasa Rand. Antibiotic orders placed and patient okay for discharge per aforesaid.

## 2023-03-11 NOTE — Patient Instructions (Addendum)
 See Hemorrhoid handout  YOU HAD AN ENDOSCOPIC PROCEDURE TODAY AT THE [facility]:   Refer to the procedure report that was given to you for any specific questions about what was found during the examination.  If the procedure report does not answer your questions, please call your gastroenterologist to clarify.  If you requested that your care partner not be given the details of your procedure findings, then the procedure report has been included in a sealed envelope for you to review at your convenience later.  YOU SHOULD EXPECT: Some feelings of bloating in the abdomen. Passage of more gas than usual.  Walking can help get rid of the air that was put into your GI tract during the procedure and reduce the bloating. If you had a lower endoscopy (such as a colonoscopy or flexible sigmoidoscopy) you may notice spotting of blood in your stool or on the toilet paper. If you underwent a bowel prep for your procedure, you may not have a normal bowel movement for a few days.  Please Note:  You might notice some irritation and congestion in your nose or some drainage.  This is from the oxygen used during your procedure.  There is no need for concern and it should clear up in a day or so.  SYMPTOMS TO REPORT IMMEDIATELY:  Following lower endoscopy (colonoscopy or flexible sigmoidoscopy):  Excessive amounts of blood in the stool  Significant tenderness or worsening of abdominal pains  Swelling of the abdomen that is new, acute  Fever of 100F or higher  Following upper endoscopy (EGD)  Vomiting of blood or coffee ground material  New chest pain or pain under the shoulder blades  Painful or persistently difficult swallowing  New shortness of breath  Fever of 100F or higher  Black, tarry-looking stools  For urgent or emergent issues, a gastroenterologist can be reached at any hour by calling [phone]. Do not use MyChart messaging for urgent concerns.    DIET:  We do recommend a  small meal at first, but then you may proceed to your regular diet.  Drink plenty of fluids but you should avoid alcoholic beverages for 24 hours.  ACTIVITY:  You should plan to take it easy for the rest of today and you should NOT DRIVE or use heavy machinery until tomorrow (because of the sedation medicines used during the test).    FOLLOW UP: Our staff will call the number listed on your records the next business day following your procedure.  We will call around 7:15- 8:00 am to check on you and address any questions or concerns that you may have regarding the information given to you following your procedure. If we do not reach you, we will leave a message.     If any biopsies were taken you will be contacted by phone or by letter within the next 1-3 weeks.  Please call us  at [phone] if you have not heard about the biopsies in 3 weeks.    SIGNATURES/CONFIDENTIALITY: You and/or your care partner have signed paperwork which will be entered into your electronic medical record.  These signatures attest to the fact that that the information above on your After Visit Summary has been reviewed and is understood.  Full responsibility of the confidentiality of this discharge information lies with you and/or your care-partner.
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
## 2023-03-11 NOTE — Progress Notes (Signed)
 Dr. Tomasa Rand notified that patient is having pain and distended abdomen even after bowel movement. Will continue to monitor.

## 2023-03-11 NOTE — Progress Notes (Signed)
 Vss nad trans to pacu

## 2023-03-11 NOTE — Op Note (Signed)
 [facility] Patient Name: Nicole Browning Procedure Date: 03/11/2023 1:56 PM MRN: [id number] Endoscopist: Glendia E. Stacia , MD, [id number] Age: 53 Referring MD:  Date of Birth: [birth year] Gender: Female Account #: [id number] Procedure:                Colonoscopy Indications:              Abnormal CT of the GI tract, Follow-up of colitis,                            Suspected diverticulitis Medicines:                Monitored Anesthesia Care Procedure:                Pre-Anesthesia Assessment:                           - Prior to the procedure, a History and Physical                            was performed, and patient medications and                            allergies were reviewed. The patient's tolerance of                            previous anesthesia was also reviewed. The risks                            and benefits of the procedure and the sedation                            options and risks were discussed with the patient.                            All questions were answered, and informed consent                            was obtained. Prior Anticoagulants: The patient has                            taken no anticoagulant or antiplatelet agents. ASA                            Grade Assessment: II - A patient with mild systemic                            disease. After reviewing the risks and benefits,                            the patient was deemed in satisfactory condition to                            undergo the procedure.  After obtaining informed consent, the colonoscope                            was passed under direct vision. Throughout the                            procedure, the patient's blood pressure, pulse, and                            oxygen saturations were monitored continuously. The                            Olympus Scope [id number] was introduced through the                            anus with the intention of  advancing to the cecum.                            The scope was advanced to the splenic flexure                            before the procedure was aborted. Medications were                            given. The colonoscopy was performed without                            difficulty. The patient tolerated the procedure                            well. The quality of the bowel preparation was                            poor. The rectum was photographed. Scope In: 2:17:57 PM Scope Out: 2:29:25 PM Total Procedure Duration: 0 hours 11 minutes 28 seconds  Findings:                 The perianal exam findings include perianal scar.                           The digital rectal exam was normal. Pertinent                            negatives include normal sphincter tone and no                            palpable rectal lesions.                           A scattered area of mildly erythematous mucosa was                            found in the sigmoid colon. Biopsies were taken  with a cold forceps for histology. Estimated blood                            loss was minimal.                           A large amount of stool was found in the rectum, in                            the sigmoid colon, in the descending colon and at                            the splenic flexure, interfering with visualization.                           The exam was otherwise normal throughout the                            examined colon.                           Non-bleeding internal hemorrhoids were found during                            retroflexion. The hemorrhoids were large.                           No additional abnormalities were found on                            retroflexion. Complications:            No immediate complications. Estimated Blood Loss:     Estimated blood loss was minimal. Impression:               - Preparation of the colon was poor.                           -  Perianal scar found on perianal exam consistent                            with previous history of perianal abscess/drainage.                           - Stool in the rectum, in the sigmoid colon, in the                            descending colon and at the splenic flexure.                           - Non-bleeding internal hemorrhoids.                           - Visualization of the colon was quite limited due  to extensive amount of colonic stool, as well as a                            narrowed/angulated sigmoid colon. The sigmoid                            mucosa was characterized by scattered areas of                            erythema as well as some larger polypoid                            erythematous lesions which were biopsied.                           - Diverticulosis suspected based on appearance of                            colon wall, although no discrete diverticula                            identified due to stool burden and anatomy of colon                            (angulated with deep folds). There was some whitish                            exudate, suggestive of possible diverticulitis.                           - Endoscopic findings not suggestive of Crohn's                            disease, but again, visualization quite limited. Recommendation:           - Patient has a contact number available for                            emergencies. The signs and symptoms of potential                            delayed complications were discussed with the                            patient. Return to normal activities tomorrow.                            Written discharge instructions were provided to the                            patient.                           - Resume previous diet.                           -  Continue present medications.                           - Await pathology results.                           - Repeat  colonoscopy at the next available                            appointment because the bowel preparation was poor.                           - Recommend CT abdomen/pelvis to reassess                            previously noted hepatic abscesses. Melisa Donofrio E. Stacia, MD 03/11/2023 2:47:54 PM This report has been signed electronically.

## 2023-03-12 ENCOUNTER — Telehealth: Payer: Self-pay | Admitting: *Deleted

## 2023-03-12 NOTE — Telephone Encounter (Signed)
 Post procedure follow up call placed, no answer and VM is full.  Unable to leave message.

## 2023-03-16 ENCOUNTER — Encounter (INDEPENDENT_AMBULATORY_CARE_PROVIDER_SITE_OTHER): Payer: Self-pay | Admitting: Primary Care

## 2023-03-16 ENCOUNTER — Ambulatory Visit (INDEPENDENT_AMBULATORY_CARE_PROVIDER_SITE_OTHER): Payer: Medicaid Other | Admitting: Primary Care

## 2023-03-16 ENCOUNTER — Telehealth: Payer: Self-pay

## 2023-03-16 VITALS — BP 116/77 | HR 71 | Resp 16 | Ht 66.5 in | Wt 120.2 lb

## 2023-03-16 DIAGNOSIS — F411 Generalized anxiety disorder: Secondary | ICD-10-CM

## 2023-03-16 LAB — SURGICAL PATHOLOGY
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
## 2023-03-17 ENCOUNTER — Telehealth: Payer: Self-pay | Admitting: *Deleted

## 2023-03-17 NOTE — Telephone Encounter (Signed)
Inbound call from patient, returning call.

## 2023-03-17 NOTE — Progress Notes (Signed)
 Ms. Nicole Browning,  The biopsies of your colon showed nonspecific reactive changes without any evidence of inflammatory bowel disease or precancerous changes.  Please plan to repeat colonoscopy as discussed, given the limited visibility on your last colonoscopy.
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
## 2023-03-17 NOTE — Telephone Encounter (Signed)
-----   Message from Nicole Browning sent at 03/17/2023  6:35 AM EST ----- Nicole Browning,  The biopsies of your colon showed nonspecific reactive changes without any evidence of inflammatory bowel disease or precancerous changes.  Please plan to repeat colonoscopy as discussed, given the limited visibility on your last colonoscopy.

## 2023-03-17 NOTE — Telephone Encounter (Signed)
 Patient called back and she said she is unable to reschedule right now due to a lot going on with trying to move and life situations right now.  I told her I would put her on a recall list to call in a 1 month to schedule but if she gets settled and can call back sooner to.  Recall placed.

## 2023-03-17 NOTE — Telephone Encounter (Signed)
 Patient's repeat colonoscopy was not scheduled at discharged.  I called patient to schedule her repeat along with a pre-visit. But no answer and left detailed VM for her to call back to schedule as soon as should could.

## 2023-03-30 ENCOUNTER — Ambulatory Visit: Payer: Medicaid Other | Admitting: Dermatology

## 2023-04-03 ENCOUNTER — Telehealth (INDEPENDENT_AMBULATORY_CARE_PROVIDER_SITE_OTHER): Payer: Self-pay | Admitting: Primary Care

## 2023-04-03 NOTE — Telephone Encounter (Signed)
Called pt to follow up with them and see if they wanted to schedule a atp. Could not leave VM mailbox was full. Pt needed transportation and I was seeing what could be done to help the pt.

## 2023-04-03 NOTE — Telephone Encounter (Signed)
Copied from CRM [id number]. Topic: General - Other >> Apr 03, 2023  9:05 AM Mosetta Putt H wrote: Reason for CRM: Patient is going through a situation and need to see if she can get assistance with a ride. She would like to speak with np edwards

## 2023-04-03 NOTE — Telephone Encounter (Signed)
Will try to schedule you an you can call Medicaid after you know appt date and schedule transportation

## 2023-04-08 ENCOUNTER — Telehealth (INDEPENDENT_AMBULATORY_CARE_PROVIDER_SITE_OTHER): Payer: Self-pay | Admitting: Primary Care

## 2023-04-08 ENCOUNTER — Ambulatory Visit (INDEPENDENT_AMBULATORY_CARE_PROVIDER_SITE_OTHER): Payer: Self-pay | Admitting: Primary Care

## 2023-04-08 NOTE — Telephone Encounter (Signed)
 Will forward to provider

## 2023-04-08 NOTE — Telephone Encounter (Signed)
 Received an incoming call fom E2C2 Nurse-Erin stating that pt refused to go to the Emergency room. Nurse called to report this to [facility]

## 2023-04-08 NOTE — Telephone Encounter (Unsigned)
 Copied from CRM [id number]. Topic: Clinical - Red Word Triage >> Apr 08, 2023  8:33 AM Farrel B wrote: [facility] that prompted transfer to Nurse Triage: Anxiety attacks for the past weeks, evicted currently living in  hotel, feeling overwhelmed, and dealing with cancer.

## 2023-04-08 NOTE — Telephone Encounter (Signed)
 Copied from CRM [id number]. Topic: Clinical - Red Word Triage >> Apr 08, 2023  8:33 AM Farrel B wrote: [facility] that prompted transfer to Nurse Triage: Anxiety attacks for the past weeks, evicted currently living in  hotel, feeling overwhelmed, and dealing with cancer.  Chief Complaint: Anxiety/panic attack Symptoms: Crying, difficulty sleeping Frequency: Ongoing recently Pertinent Negatives: Patient denies thoughts of harming self/others Disposition: [x] ED /[] Urgent Care (no appt availability in office) / [] Appointment(In office/virtual)/ []  [place] [facility]/ [] Home Care/ [x] Refused Recommended Disposition /[] [place] [facility]/ []  Follow-up with PCP Additional Notes: Patient called in while having an active anxiety/panic attack. Patient was actively crying and could not calm down while on the phone with this RN. Patient stated that she is feeling very overwhelmed with her current life situation. Patient stated that she is currently living in a hotel. Patient denied thoughts of harming self or others. Patient stated that she is also having difficulty sleeping. Patient stated that the anxiety/panic attacks are recent and denied a history of anxiety. This RN advised patient to go to the ED for management and support of her symptoms right now. Patient declined and stated that she did not want to sit in the ED all day. This RN advised the patient that I would make the office aware of her situation. This RN called [facility] and notified office of the situation.  Reason for Disposition  [1] SEVERE anxiety (e.g., extremely anxious with intense emotional symptoms such as feeling of unreality, urge to flee, unable to calm down; unable to cope or function) AND [2] not better after 10 minutes of reassurance and Care Advice  Answer Assessment - Initial Assessment Questions 1. CONCERN: Did anything happen that prompted you to call today?      States she feels overwhelmed with her current life situation 2.  ANXIETY SYMPTOMS: Can you describe how you (your loved one; patient) have been feeling? (e.g., tense, restless, panicky, anxious, keyed up, overwhelmed, sense of impending doom).      Feels overwhelmed, patient was crying on the phone with this RN 3. ONSET: How long have you been feeling this way? (e.g., hours, days, weeks)     States crying spells has been going on for awhile 4. SEVERITY: How would you rate the level of anxiety? (e.g., 0 - 10; or mild, moderate, severe).     Rates anxiety an 8 at the moment 5. FUNCTIONAL IMPAIRMENT: How have these feelings affected your ability to do daily activities? Have you had more difficulty than usual doing your normal daily activities? (e.g., getting better, same, worse; self-care, school, work, interactions)     States she has still been able to perform self care, states she is having difficulty sleeping 6. HISTORY: Have you felt this way before? Have you ever been diagnosed with an anxiety problem in the past? (e.g., generalized anxiety disorder, panic attacks, PTSD). If Yes, ask: How was this problem treated? (e.g., medicines, counseling, etc.)     States anxiety is recent due to life situation 7. RISK OF HARM - SUICIDAL IDEATION: Do you ever have thoughts of hurting or killing yourself? If Yes, ask:  Do you have these feelings now? Do you have a plan on how you would do this?     Denies thoughts of harming her self or others 8. TREATMENT:  What has been done so far to treat this anxiety? (e.g., medicines, relaxation strategies). What has helped?     States she is not on medication for anxiety  9. TREATMENT - THERAPIST: Do you have a counselor or therapist? Name?     States she has a veterinary surgeon 11. PATIENT SUPPORT: Who is with you now? Who do you live with? Do you have family or friends who you can talk to?      States she has a friend who is work right now 109. OTHER SYMPTOMS: Do you have any other symptoms? (e.g.,  feeling depressed, trouble concentrating, trouble sleeping, trouble breathing, palpitations or fast heartbeat, chest pain, sweating, nausea, or diarrhea)     Can't stop crying, difficulty sleeping  Protocols used: Anxiety and Panic Attack-A-AH

## 2023-04-09 ENCOUNTER — Encounter (INDEPENDENT_AMBULATORY_CARE_PROVIDER_SITE_OTHER): Payer: Self-pay | Admitting: Primary Care

## 2023-04-09 NOTE — Telephone Encounter (Signed)
 noted

## 2023-04-15 ENCOUNTER — Telehealth: Payer: Self-pay

## 2023-04-15 ENCOUNTER — Telehealth (INDEPENDENT_AMBULATORY_CARE_PROVIDER_SITE_OTHER): Payer: Self-pay | Admitting: Licensed Clinical Social Worker

## 2023-04-15 NOTE — Telephone Encounter (Signed)
Copied from CRM [id number]. Topic: General - Other >> Apr 15, 2023  2:32 PM Suzette B wrote: Reason for CRM: Patient called stated she had received a call from the office researching all the information to see if there were any notes and notes were unavailable.  Please call patient at [phone]

## 2023-04-15 NOTE — Telephone Encounter (Signed)
LCSWA called patient today to introduce herself and to assess patients' mental health needs. Patient did not answer the phone. LCSWA was able to leave a brief message with the patient asking them to return the call. Patient was referred by PCP for Anxiety.

## 2023-04-15 NOTE — Telephone Encounter (Signed)
Routing to office

## 2023-04-29 ENCOUNTER — Telehealth: Payer: Self-pay | Admitting: Licensed Clinical Social Worker
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
## 2023-05-07 ENCOUNTER — Ambulatory Visit
Admission: RE | Admit: 2023-05-07 | Discharge: 2023-05-07 | Disposition: A | Discharge status: Home / Self Care | Payer: Medicaid Other | Source: Ambulatory Visit | Attending: Radiation Oncology | Admitting: Radiation Oncology

## 2023-05-07 ENCOUNTER — Telehealth: Payer: Self-pay | Admitting: Radiation Oncology

## 2023-05-07 NOTE — Telephone Encounter (Signed)
 Pt called asking to r/s today's f/u appt. Pt moved to 3/20 at her request.

## 2023-05-13 ENCOUNTER — Telehealth (INDEPENDENT_AMBULATORY_CARE_PROVIDER_SITE_OTHER): Payer: Self-pay

## 2023-05-13 NOTE — Telephone Encounter (Signed)
 Copied from CRM [id number]. Topic: General - Other >> May 13, 2023  8:46 AM Everette C wrote: Reason for CRM: The patient has called to request an updated letter from their PCP about medical assistance. The patient shares that they need additional documents for their companion/emotional support animal   Please contact the patient further when available

## 2023-05-14 ENCOUNTER — Telehealth (INDEPENDENT_AMBULATORY_CARE_PROVIDER_SITE_OTHER): Payer: Self-pay | Admitting: Primary Care

## 2023-05-14 NOTE — Telephone Encounter (Signed)
 Per E2C2 called in stating the patient has called to request an updated letter from their PCP about medical assistance. The patient shares that they need additional documents for their companion/emotional support animal. I informed agent that I would put a telephone encounter in and send to the provider. Please advise.

## 2023-05-14 NOTE — Telephone Encounter (Signed)
 This is a duplicate message. Message was sent to provider yesterday in regards to letter

## 2023-05-15 NOTE — Telephone Encounter (Signed)
 To Whom It May Concern,   I am writing to request permission for my patient, Xxxx to have a support animal in their residence. I believe that having a support animal will greatly benefit their mental health and well-being.   XXX has been diagnosed with anxiety disorder, a condition that significantly impacts their daily life. Anxiety disorders can cause persistent and excessive worry, fear, and apprehension, making it challenging for individuals to cope with everyday stressors.    Research has shown that support animals can provide emotional support and companionship, which can help alleviate symptoms of anxiety. The presence of a support animal has been shown to reduce stress levels, promote relaxation, and improve overall mood. XXX having a support animal would offer valuable emotional support and help them better manage their anxiety symptoms.   I can confirm that XXX has a legitimate need for a support animal as part of their treatment plan for anxiety. As their healthcare provider, I recommend the presence of a support animal as an essential component of their therapy. Having a support animal would contribute to Alverto's overall well-being and ability to function effectively in their living environment.   I kindly request that you consider accommodatingXXX request for a support animal in accordance with fair housing laws and regulations. If you require any further information or documentation, please do not hesitate to contact me.   Thank you for your attention to this matter. Your cooperation in providing a supportive living environment for XXX is greatly appreciated.

## 2023-05-15 NOTE — Telephone Encounter (Signed)
 Nicole Browning

## 2023-05-21 ENCOUNTER — Inpatient Hospital Stay: Admission: RE | Admit: 2023-05-21 | Source: Ambulatory Visit | Admitting: Radiation Oncology

## 2023-05-21 ENCOUNTER — Ambulatory Visit (HOSPITAL_COMMUNITY): Payer: Self-pay

## 2023-05-21 ENCOUNTER — Ambulatory Visit
Admission: RE | Admit: 2023-05-21 | Discharge: 2023-05-21 | Disposition: A | Discharge status: Home / Self Care | Source: Ambulatory Visit | Attending: Radiation Oncology | Admitting: Radiation Oncology

## 2023-05-24 ENCOUNTER — Ambulatory Visit (HOSPITAL_COMMUNITY): Payer: Self-pay

## 2023-05-31 ENCOUNTER — Ambulatory Visit (HOSPITAL_COMMUNITY): Payer: Self-pay

## 2023-06-01 ENCOUNTER — Other Ambulatory Visit: Payer: Self-pay

## 2023-06-01 ENCOUNTER — Encounter (HOSPITAL_COMMUNITY): Payer: Self-pay | Admitting: Emergency Medicine

## 2023-06-01 ENCOUNTER — Telehealth: Payer: Self-pay | Admitting: Radiation Oncology

## 2023-06-01 ENCOUNTER — Ambulatory Visit
Admission: RE | Admit: 2023-06-01 | Discharge: 2023-06-01 | Disposition: A | Discharge status: Home / Self Care | Source: Ambulatory Visit | Attending: Radiation Oncology | Admitting: Radiation Oncology

## 2023-06-01 ENCOUNTER — Ambulatory Visit (HOSPITAL_COMMUNITY)
Admission: EM | Admit: 2023-06-01 | Discharge: 2023-06-01 | Disposition: A | Discharge status: Home / Self Care | Attending: Family Medicine | Admitting: Family Medicine

## 2023-06-01 DIAGNOSIS — L739 Follicular disorder, unspecified: Secondary | ICD-10-CM

## 2023-06-01 MED ORDER — MUPIROCIN 2 % EX OINT: 1.0000 | TOPICAL_OINTMENT | Freq: Two times a day (BID) | CUTANEOUS | 0 refills | Status: DC

## 2023-06-01 MED ORDER — DOXYCYCLINE HYCLATE 100 MG PO CAPS
100.0000 mg | ORAL_CAPSULE | Freq: Two times a day (BID) | ORAL | 0 refills | Status: AC
Start: 2023-06-01 — End: 2023-06-08

## 2023-06-01 NOTE — ED Triage Notes (Signed)
 Points to lower lids of both eyes as being puffy.  This area is also sore.  Patient does not wear contacts

## 2023-06-01 NOTE — ED Provider Notes (Signed)
 [facility]    CSN: [id number] Arrival date & time: 06/01/23  1720      History   Chief Complaint Chief Complaint  Patient presents with   Eye Problem    HPI Nicole Browning is a 53 y.o. female.   Here for swelling and tenderness on either side of her nose near her eyes.  This has been noted for a few days.  At triage staff understood her to say her lower eyelids were puffy.  This is not the case she points to a swollen area on either side of her nose that is swelling and mildly red.  No fever or chills p She is allergic to oxycodone, Valium, and sulfa.  She is postmenopausal  Past Medical History:  Diagnosis Date   AKI (acute kidney injury) (HCC) 10/03/2020   Anemia    Anxiety    Cancer (HCC)    Breast   Depression    Diverticulitis 12/13/2020   GERD (gastroesophageal reflux disease)    History of radiation therapy    Right breast-10/08/22-11/28/22- Dr. Antony Blackbird   IBS (irritable bowel syndrome)    Perirectal abscess    Protein-calorie malnutrition (HCC) 02/29/2016   Tobacco use    UC (ulcerative colitis) [facility])     Patient Active Problem List   Diagnosis Date Noted   Malignant neoplasm of upper-outer quadrant of right breast in female, estrogen receptor positive (HCC) 07/01/2022   Diverticulitis 12/13/2020   Smoking 11/22/2020   Liver abscess 10/17/2020   Medication monitoring encounter 10/17/2020   Streptococcal bacteremia    Intra-abdominal abscess (HCC) 10/03/2020   IDA (iron deficiency anemia) 07/24/2020   IBD (inflammatory bowel disease) 07/24/2020   Protein-calorie malnutrition (HCC) 02/29/2016   Elevated hemoglobin A1c 02/27/2016   GERD (gastroesophageal reflux disease)    Tobacco use    Anemia of chronic disease    Prediabetes    Perirectal abscess 02/24/2016    Past Surgical History:  Procedure Laterality Date   BIOPSY  09/29/2020   Procedure: BIOPSY;  Surgeon: Lynann Bologna, MD;  Location: [facility] ENDOSCOPY;  Service: Endoscopy;;    BREAST BIOPSY Right 06/24/2022   [place] RT BREAST BX W LOC DEV 1ST LESION IMG BX SPEC US GUIDE 06/24/2022 [facility]   BREAST BIOPSY Right 06/24/2022   [place] RT BREAST BX W LOC DEV EA ADD LESION IMG BX SPEC US GUIDE 06/24/2022 [facility]   BREAST BIOPSY  08/13/2022   MM RT RADIOACTIVE SEED LOC MAMMO GUIDE 08/13/2022 [facility]   BREAST LUMPECTOMY WITH RADIOACTIVE SEED AND SENTINEL LYMPH NODE BIOPSY Right 08/14/2022   Procedure: RIGHT BREAST LUMPECTOMY WITH RADIOACTIVE SEED AND SENTINEL LYMPH NODE BIOPSY;  Surgeon: Almond Lint, MD;  Location: [facility];  Service: General;  Laterality: Right;   ESOPHAGOGASTRODUODENOSCOPY (EGD) WITH PROPOFOL N/A 09/29/2020   Procedure: ESOPHAGOGASTRODUODENOSCOPY (EGD) WITH PROPOFOL;  Surgeon: Lynann Bologna, MD;  Location: [facility] ENDOSCOPY;  Service: Endoscopy;  Laterality: N/A;   IR RADIOLOGIST EVAL & MGMT  10/17/2020   IRRIGATION AND DEBRIDEMENT ABSCESS N/A 02/24/2016   Procedure: IRRIGATION AND DEBRIDEMENT ABSCESS;  Surgeon: Almond Lint, MD;  Location: [facility];  Service: General;  Laterality: N/A;    OB History   No obstetric history on file.      Home Medications    Prior to Admission medications   Medication Sig Start Date End Date Taking? Authorizing Provider  doxycycline (VIBRAMYCIN) 100 MG capsule Take 1 capsule (100 mg total) by mouth 2 (two) times daily for  7 days. 06/01/23 06/08/23 Yes Adyan Palau, Janace Aris, MD  mupirocin ointment (BACTROBAN) 2 % Apply 1 Application topically 2 (two) times daily. To affected area till better 06/01/23  Yes Dacen Frayre, Janace Aris, MD  pregabalin (LYRICA) 50 MG capsule Take by mouth. 05/28/23 05/27/24 Yes [provider]  bismuth subsalicylate (PEPTO BISMOL) 262 MG/15ML suspension Take 30 mLs by mouth every 6 (six) hours as needed for indigestion. Patient not taking: Reported on 02/17/2023    [provider]  gabapentin (NEURONTIN) 100 MG capsule Take 1 capsule (100 mg total) by mouth 3  (three) times daily. 10/29/22   Antony Blackbird, MD  Multiple Vitamins-Minerals (WOMENS MULTIVITAMIN PO) Take 1 tablet by mouth daily.    [provider]  POTASSIUM PO Take 1 tablet by mouth daily. Patient not taking: Reported on 02/17/2023    [provider]    Family History Family History  Problem Relation Age of Onset   Diabetes Mother    COPD Father    Leukemia Brother    Colon cancer Neg Hx    Colon polyps Neg Hx    Esophageal cancer Neg Hx    Rectal cancer Neg Hx    Stomach cancer Neg Hx     Social History Social History   Tobacco Use   Smoking status: Former    Current packs/day: 0.33    Average packs/day: 0.3 packs/day for 28.0 years (9.2 ttl pk-yrs)    Types: Cigarettes   Smokeless tobacco: Never  Vaping Use   Vaping status: Never Used  Substance Use Topics   Alcohol use: Yes    Alcohol/week: 2.0 standard drinks of alcohol    Types: 2 Glasses of wine per week   Drug use: No     Allergies   Bactrim [sulfamethoxazole-trimethoprim], Percocet [oxycodone-acetaminophen], and Valium [diazepam]   Review of Systems Review of Systems   Physical Exam Triage Vital Signs ED Triage Vitals  Encounter Vitals Group     BP 06/01/23 1806 111/75     Systolic BP Percentile --      Diastolic BP Percentile --      Pulse Rate 06/01/23 1806 91     Resp 06/01/23 1806 18     Temp 06/01/23 1806 98.8 F (37.1 C)     Temp Source 06/01/23 1806 Oral     SpO2 06/01/23 1806 98 %     Weight --      Height --      Head Circumference --      Peak Flow --      Pain Score 06/01/23 1803 7     Pain Loc --      Pain Education --      Exclude from Growth Chart --    No data found.  Updated Vital Signs BP 111/75 (BP Location: Right Arm)   Pulse 91   Temp 98.8 F (37.1 C) (Oral)   Resp 18   LMP 10/11/2022 (Approximate)   SpO2 98%   Visual Acuity Right Eye Distance:   Left Eye Distance:   Bilateral Distance:    Right Eye Near:   Left Eye Near:     Bilateral Near:     Physical Exam Vitals reviewed.  Constitutional:      General: She is not in acute distress.    Appearance: She is not toxic-appearing.  HENT:     Mouth/Throat:     Mouth: Mucous membranes are moist.  Eyes:     Extraocular Movements: Extraocular movements intact.  Conjunctiva/sclera: Conjunctivae normal.     Pupils: Pupils are equal, round, and reactive to light.     Comments: There is no swelling of either eyelid and the conjunctiva are not injected.  Pupils equally round and reactive to light bilaterally.    Skin:    Coloration: Skin is not pale.     Comments: On both sides of the bridge of her upper nose there is a 1 cm diameter area of erythema and induration and tenderness.  There is no fluctuance at this time on 8 either lesion.  She has a couple smaller similar lesions lower on her cheeks bilaterally.  Neurological:     Mental Status: She is alert and oriented to person, place, and time.  Psychiatric:        Behavior: Behavior normal.      UC Treatments / Results  Labs (all labs ordered are listed, but only abnormal results are displayed) Labs Reviewed - No data to display  EKG   Radiology No results found.  Procedures Procedures (including critical care time)  Medications Ordered in UC Medications - No data to display  Initial Impression / Assessment and Plan / UC Course  I have reviewed the triage vital signs and the nursing notes.  Pertinent labs & imaging results that were available during my care of the patient were reviewed by me and considered in my medical decision making (see chart for details).     Doxycycline is sent in for the folliculitis/acne.  Bactroban is sent and also to treat topically.  Warm compresses are recommended Final Clinical Impressions(s) / UC Diagnoses   Final diagnoses:  Folliculitis     Discharge Instructions      Take doxycycline 100 mg --1 capsule 2 times daily for 7 days  Put mupirocin  ointment on the sore areas twice daily until improved  Do warm compresses on the sore areas 2 or 3 times a day.     ED Prescriptions     Medication Sig Dispense Auth. Provider   doxycycline (VIBRAMYCIN) 100 MG capsule Take 1 capsule (100 mg total) by mouth 2 (two) times daily for 7 days. 14 capsule Zenia Resides, MD   mupirocin ointment (BACTROBAN) 2 % Apply 1 Application topically 2 (two) times daily. To affected area till better 22 g Marlinda Mike Janace Aris, MD      PDMP not reviewed this encounter.   Zenia Resides, MD 06/01/23 [phone]

## 2023-06-01 NOTE — Discharge Instructions (Signed)
 Take doxycycline 100 mg --1 capsule 2 times daily for 7 days  Put mupirocin ointment on the sore areas twice daily until improved  Do warm compresses on the sore areas 2 or 3 times a day.

## 2023-06-01 NOTE — Telephone Encounter (Signed)
 Returned call from pt asking to r/s 3/31 f/u. Spoke to pt who agreed to 4/14@2 :15pm fpr f/u.

## 2023-06-02 ENCOUNTER — Telehealth (INDEPENDENT_AMBULATORY_CARE_PROVIDER_SITE_OTHER): Payer: Self-pay | Admitting: Primary Care

## 2023-06-02 NOTE — Telephone Encounter (Signed)
 Copied from CRM [id number]. Topic: General - Other >> May 13, 2023  8:46 AM Everette C wrote: Reason for CRM: The patient has called to request an updated letter from their PCP about medical assistance. The patient shares that they need additional documents for their companion/emotional support animal   Please contact the patient further when available >> Jun 01, 2023  1:19 PM Eunice Blase wrote: The patient need additional documents for their companion/emotional support animal. Please call pt at [phone]  >> May 14, 2023 11:26 AM Clayton Bibles wrote: Viann called back today to see if she could get the letter today. I called the clinic and the will let provider know today

## 2023-06-03 ENCOUNTER — Telehealth (INDEPENDENT_AMBULATORY_CARE_PROVIDER_SITE_OTHER): Payer: Self-pay

## 2023-06-03 NOTE — Telephone Encounter (Signed)
 Copied from CRM [id number]. Topic: General - Other >> Jun 02, 2023 10:29 AM Patsy Lager T wrote: Reason for CRM: patient called again stated she go to court tomorrow and need the letter to take with her in the morning. Please f/u with patient

## 2023-06-04 ENCOUNTER — Encounter (INDEPENDENT_AMBULATORY_CARE_PROVIDER_SITE_OTHER): Payer: Self-pay | Admitting: *Deleted

## 2023-06-04 NOTE — Telephone Encounter (Signed)
 Left message on voicemail that letter is at the front desk, may pick up from office and that office is closed for lunch between 1230 amd 1330.

## 2023-06-05 ENCOUNTER — Telehealth (INDEPENDENT_AMBULATORY_CARE_PROVIDER_SITE_OTHER): Payer: Self-pay

## 2023-06-05 NOTE — Telephone Encounter (Signed)
 Copied from CRM [id number]. Topic: General - Other >> May 13, 2023  8:46 AM Everette C wrote: Reason for CRM: The patient has called to request an updated letter from their PCP about medical assistance. The patient shares that they need additional documents for their companion/emotional support animal   Please contact the patient further when available >> Jun 01, 2023  1:19 PM Eunice Blase wrote: The patient need additional documents for their companion/emotional support animal. Please call pt at [phone]  >> May 14, 2023 11:26 AM Clayton Bibles wrote: Viann called back today to see if she could get the letter today. I called the clinic and the will let provider know today

## 2023-06-07 ENCOUNTER — Encounter (INDEPENDENT_AMBULATORY_CARE_PROVIDER_SITE_OTHER): Payer: Self-pay | Admitting: Primary Care

## 2023-06-08 NOTE — Telephone Encounter (Signed)
 Called patient and left voicemail.

## 2023-06-15 ENCOUNTER — Ambulatory Visit
Admission: RE | Admit: 2023-06-15 | Discharge: 2023-06-15 | Disposition: A | Discharge status: Home / Self Care | Source: Ambulatory Visit | Attending: Radiation Oncology | Admitting: Radiation Oncology

## 2023-07-01 NOTE — Progress Notes (Addendum)
 Radiation Oncology         [phone] ________________________________  Name: Nicole Browning MRN: [id number]  Date: 07/02/2023  DOB: 03-26-1970  Follow-Up Visit Note  CC: Marius Siemens, NP  Lockie Rima, MD    ICD-10-CM   1. Malignant neoplasm of upper-outer quadrant of right breast in female, estrogen receptor positive (HCC)  C50.411    Z17.0        Diagnosis: Stage 1B (T2, N0, M0) Right Breast UOQ, Invasive Ductal Carcinoma, ER+ / PR+ / Her2-, Grade 1      Interval Since Last Radiation: 7 months and 5 days    Indication for treatment: Curative       Radiation treatment dates: 10/08/22 through 11/28/22  Site/dose:    1) Right breast - 40.05 Gy delivered in 15 Fx at 2.67 Gy/Fx 2) Right breast boost - 12 Gy delivered in 6 Fx at 2 Gy/Fx Technique/Mode: 3D / Photon  Beams/energy: 6X-FFF  Narrative:  The patient returns today for routine follow-up. She was last seen in office on 02-05-23 for a routine follow up visit.                             She presented for a follow up visit with Dr. Cherlynn Cornfield on 05-04-23 with complaints of post-operative pain and numbness to the back of her shoulder and numbness beneath her arm. She characterized her pain as severe enough to interfere with her job and ability to do physical work. She was taking gabapentin  for her symptoms which apparently only functioned as a sleep aid for her and did not help with her pain. Dr. Cherlynn Cornfield has subsequently prescribed her lyrica for further pain control and also placed a referral back to medical oncology at that time for re-consideration of antihormonal therapy.   No other significant oncologic interval history since the patient was last seen.   Of note: patient underwent a colonoscopy on 03-11-23 which was inconclusive due to a poor colonic preparation. She was also seen in the ED on 3/31 for folliculitis.    Patient notes ongoing intermittent right axilla pain that is sharp and shooting.  This pain is relieved  by Lyrica, although it has not improved.  She unfortunately has lost her dog in the process of getting kicked out of her house and is understandably quite concerned about this.  She has not seen medical oncology since completing her radiation treatment.  She was re-referred by Dr. Cherlynn Cornfield in March but states she has not been contacted.                           Allergies:  is allergic to bactrim  [sulfamethoxazole -trimethoprim ], percocet [oxycodone-acetaminophen ], and valium [diazepam].  Meds: Current Outpatient Medications  Medication Sig Dispense Refill   bismuth  subsalicylate (PEPTO BISMOL) 262 MG/15ML suspension Take 30 mLs by mouth every 6 (six) hours as needed for indigestion. (Patient not taking: Reported on 02/17/2023)     gabapentin  (NEURONTIN ) 100 MG capsule Take 1 capsule (100 mg total) by mouth 3 (three) times daily. (Patient not taking: Reported on 07/02/2023) 60 capsule 0   Multiple Vitamins-Minerals (WOMENS MULTIVITAMIN PO) Take 1 tablet by mouth daily.     mupirocin  ointment (BACTROBAN ) 2 % Apply 1 Application topically 2 (two) times daily. To affected area till better 22 g 0   POTASSIUM PO Take 1 tablet by mouth daily. (Patient not taking: Reported on 02/17/2023)  pregabalin (LYRICA) 50 MG capsule Take by mouth.     Current Facility-Administered Medications  Medication Dose Route Frequency Provider Last Rate Last Admin   0.9 %  sodium chloride  infusion  500 mL Intravenous Once Elois Hair, MD        Physical Findings: The patient is in no acute distress. Patient is alert and oriented.  weight is 122 lb (55.3 kg). Her temperature is 98.3 F (36.8 C). Her blood pressure is 117/75 and her pulse is 90. Her respiration is 18 and oxygen saturation is 100%. .  No significant changes. Lungs are clear to auscultation bilaterally. Heart has regular rate and rhythm. No palpable cervical, supraclavicular, or axillary adenopathy. Abdomen soft, non-tender, normal bowel  sounds.  Left breast: No palpable mass, skin changes, or any other abnormalities appreciated. Right breast: Well-healed surgical incisions.  No palpable mass, or skin changes.  Tenderness to light touch in the upper outer quadrant and right axilla.   Lab Findings: Lab Results  Component Value Date   WBC 6.5 11/20/2022   HGB 9.7 (L) 11/20/2022   HCT 29.8 (L) 11/20/2022   MCV 85.4 11/20/2022   PLT 379 11/20/2022    Radiographic Findings: No results found.  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  She fortunately experiences some relief with this.  We discussed the role of physical therapy, however, patient is currently experiencing a lot of social stressors and would like to defer PT at this time.  Patient is following up with Dr.Byerly in approximately 3 months. Referral to palliative care made today to also help manage this.  She has not seen medical oncology since completing her radiation treatment.  Referral made today.  Patient is aware of the importance of this appointment.  Patient is facing numerous social stressors in addition to her recent medical challenges. A referral to social work has been placed today to help address these concerns.   Radiation follow-up as needed.  We appreciate the opportunity to take part in this patient's care.  She was encouraged to call with any questions or concerns.   20 minutes of total time was spent for this patient encounter, including preparation, face-to-face counseling with the patient and coordination of care, physical exam, and documentation of the encounter. ____________________________________   Julio Ohm, PA-C  This document serves as a record of services personally performed by Julio Ohm, PA-C. It was created on his behalf by Lucky Sable, a trained medical scribe. The creation of this  record is based on the scribe's personal observations and the provider's statements to them. This document has been checked and approved by the attending provider.

## 2023-07-02 ENCOUNTER — Ambulatory Visit
Admission: RE | Admit: 2023-07-02 | Discharge: 2023-07-02 | Disposition: A | Discharge status: Home / Self Care | Source: Ambulatory Visit | Attending: Radiation Oncology | Admitting: Radiation Oncology

## 2023-07-02 ENCOUNTER — Encounter: Payer: Self-pay | Admitting: Radiation Oncology

## 2023-07-02 ENCOUNTER — Inpatient Hospital Stay

## 2023-07-02 ENCOUNTER — Inpatient Hospital Stay: Attending: Hematology and Oncology | Admitting: Licensed Clinical Social Worker

## 2023-07-02 ENCOUNTER — Encounter: Payer: Self-pay | Admitting: *Deleted

## 2023-07-02 VITALS — BP 117/75 | HR 90 | Temp 98.3°F | Resp 18 | Wt 122.0 lb

## 2023-07-02 DIAGNOSIS — C50411 Malignant neoplasm of upper-outer quadrant of right female breast: Secondary | ICD-10-CM | POA: Diagnosis present

## 2023-07-02 DIAGNOSIS — Z79899 Other long term (current) drug therapy: Secondary | ICD-10-CM | POA: Insufficient documentation

## 2023-07-02 DIAGNOSIS — Z923 Personal history of irradiation: Secondary | ICD-10-CM | POA: Diagnosis not present

## 2023-07-02 DIAGNOSIS — Z170 Estrogen receptor positive status [ER+]: Secondary | ICD-10-CM | POA: Diagnosis not present

## 2023-07-02 NOTE — Addendum Note (Signed)
 Encounter addended by: Pearlene Bouchard, PA-C on: 07/02/2023 12:41 PM  Actions taken: Level of Service modified

## 2023-07-02 NOTE — Progress Notes (Signed)
 Nicole Browning is here today for follow up post radiation to the breast.   Breast Side:Right   They completed their radiation on: 11/28/22   Does the patient complain of any of the following: Post radiation skin issues: No Breast Tenderness: Patient reports intermittent sharp pain to right breast.  Breast Swelling: No Lymphadema: No Range of Motion limitations:yes, patient reports numbness to right axilla.  Fatigue post radiation: Yes  Appetite good/fair/poor: good  Additional comments if applicable:   BP 117/75   Pulse 90   Temp 98.3 F (36.8 C)   Resp 18   Wt 122 lb (55.3 kg)   LMP 10/11/2022 (Approximate)   SpO2 100%   BMI 19.40 kg/m

## 2023-07-02 NOTE — Progress Notes (Signed)
 [facility] Clinical Social Work  Copywriter, advertising received referral for patient for financial assistance. Patient reported need for assistance with security deposit for rental home. Patient is aware oncology based assistance require active treatment. Covering CSW is not aware of resources to help with specific need. CSW encouraged patient to reach out to local churches, DSS, and local agencies.  Covering  CSW provided contact information for patient's primary social worker to follow up on return to office.   Maudie Sorrow, LCSW  Clinical Social Worker [facility]

## 2023-07-02 NOTE — Addendum Note (Signed)
 Encounter addended by: Pearlene Bouchard, PA-C on: 07/02/2023 12:42 PM  Actions taken: Clinical Note Signed

## 2023-07-02 NOTE — Progress Notes (Signed)
 [facility] CSW Progress Note  Visual merchandiser  provided pt w/ a food bag from the [employer].        Rachel Moulds, LCSW Clinical Social Worker [facility]    Patient is participating in a Managed Medicaid Plan:  Yes

## 2023-07-06 ENCOUNTER — Inpatient Hospital Stay: Admitting: Licensed Clinical Social Worker

## 2023-07-06 NOTE — Progress Notes (Signed)
 [facility] CSW Progress Note  Clinical Child psychotherapist contacted patient by phone to follow-up on resources related to security deposit (per note from [employer]).  CSW confirmed information shared by [employer]. Discussed what documents pt will need to bring with her for potential assistance with the security deposit from [facility]. If they are unable to assist, pt will try [employer], DSS, or local churches.    Nicole Dambrosia E Criss Bartles, LCSW Clinical Social Worker [facility]    Patient is participating in a Managed Medicaid Plan:  Yes

## 2023-07-17 NOTE — Progress Notes (Signed)
 Case Management 01/15/2024  SW met with client to gather more information about client's situation and better assess client's needs. Client mentioned a need for financial assistance during client's health crisis. SW provided client with a few community resources to follow-up on. Client was also experiencing emotional distress related to client's current homelife. SW used reflective listening to allow client to unpack her emotions. Client felt that this was helpful and may be interested in future therapy.  Lionel Riddle, MSW student

## 2023-07-17 NOTE — Progress Notes (Signed)
 Case Management 01/21/2024  SW had a brief meeting with client, due to client's schedule. Client needed to leave early, but updated SW about the community resources that she has checked into. SW encouraged client to continue reaching out to resource providers and offered assistance with the process. Client felt confident about the developments of her progress.  Lionel Riddle, MSW student

## 2023-08-04 ENCOUNTER — Inpatient Hospital Stay: Attending: Hematology and Oncology | Admitting: Hematology and Oncology

## 2023-08-04 ENCOUNTER — Inpatient Hospital Stay

## 2023-08-04 NOTE — Assessment & Plan Note (Deleted)
 08/14/2022: Right lumpectomy: Grade 1 IDC 2.4 cm with grade 2 DCIS, margins negative, ER 90%, PR 10%, HER2 negative, Ki-67 1%, 0/5 lymph nodes negative, Oncotype DX score 19 (6% risk of distant recurrence) Adjuvant radiation completed 11/28/2022  Treatment plan: Adjuvant antiestrogen therapy

## 2023-08-07 ENCOUNTER — Telehealth: Payer: Self-pay

## 2023-08-07 NOTE — Telephone Encounter (Signed)
 Nicole Browning is not available to scheduled her Palliative care appointment. I left a VM for her to return my call.

## 2023-08-13 ENCOUNTER — Telehealth: Payer: Self-pay | Admitting: Radiation Oncology

## 2023-08-13 NOTE — Telephone Encounter (Signed)
 6/12 Received call from [facility], Oncology Scheduler.  She stated they received referral from RadOnc and reached out to patient 3 times and no response back, so at this point they are closing out the referral.

## 2023-08-14 ENCOUNTER — Telehealth: Payer: Self-pay | Admitting: Nurse Practitioner

## 2023-08-14 NOTE — Telephone Encounter (Signed)
 Referral placed on 5/5 for Palliative (Pain management). Patient has been unable to get scheduled after multiple attempts to reach for scheduling over the past several weeks. Attempted to reach patient again to schedule. Appropriate voicemail left with contact information. At this time given inability to reach patient will await patient to contact office and will be happy to get scheduled for symptom management.

## 2023-09-09 ENCOUNTER — Encounter (HOSPITAL_COMMUNITY): Payer: Self-pay

## 2023-09-09 ENCOUNTER — Ambulatory Visit (HOSPITAL_COMMUNITY)
Admission: EM | Admit: 2023-09-09 | Discharge: 2023-09-09 | Disposition: A | Discharge status: Home / Self Care | Attending: Family Medicine | Admitting: Family Medicine

## 2023-09-09 DIAGNOSIS — L739 Follicular disorder, unspecified: Secondary | ICD-10-CM | POA: Diagnosis not present

## 2023-09-09 MED ORDER — MUPIROCIN 2 % EX OINT: 1.0000 | TOPICAL_OINTMENT | Freq: Two times a day (BID) | CUTANEOUS | 0 refills | Status: DC

## 2023-09-09 MED ORDER — CLINDAMYCIN HCL 300 MG PO CAPS: 300.0000 mg | ORAL_CAPSULE | Freq: Three times a day (TID) | ORAL | 0 refills | Status: DC

## 2023-09-09 NOTE — Discharge Instructions (Addendum)
 Take clindamycin  antibiotic every 8 hours for the next 7 days.  Apply mupirocin  ointment to the rash every 12 hours for the next 7 days.  Please call [facility] dermatologist to schedule an appointment. A referral has been sent.

## 2023-09-09 NOTE — ED Triage Notes (Signed)
 Patient states rash all over her face x 1 week. Patient would like a Dermatology referral.

## 2023-09-09 NOTE — ED Provider Notes (Signed)
 [facility]    CSN: [id number] Arrival date & time: 09/09/23  1739      History   Chief Complaint Chief Complaint  Patient presents with   Rash    HPI Nicole Browning is a 53 y.o. female.   Patient presents to urgent care for evaluation of facial rash.  Facial rash is chronic, comes and goes, and has been treated as folliculitis in the last several months with doxycycline  and mupirocin .  Rash consists of erythematous pustules and generalized facial swelling.  Patient is unsure of what triggers the rash and is requesting referral to dermatology today for follow-up.  Rash is not itchy but is rather painful.  Denies recent new personal hygiene products, exposure to known environmental/food allergens, recent sick contacts with similar rash, ear pain, sore throat, dizziness, fever/chills, and recent changes in make-up/other facial products.  She was last seen for rash on August 12, 2023 in the emergency department at [facility] where she was prescribed mupirocin  and doxycycline .  Mupirocin  and doxycycline  did not help very much with the rash and she is requesting a different antibiotic today.     Past Medical History:  Diagnosis Date   AKI (acute kidney injury) (HCC) 10/03/2020   Anemia    Anxiety    Cancer (HCC)    Breast   Depression    Diverticulitis 12/13/2020   GERD (gastroesophageal reflux disease)    History of radiation therapy    Right breast-10/08/22-11/28/22- Dr. Lynwood Nasuti   IBS (irritable bowel syndrome)    Perirectal abscess    Protein-calorie malnutrition (HCC) 02/29/2016   Tobacco use    UC (ulcerative colitis) [facility])     Patient Active Problem List   Diagnosis Date Noted   Malignant neoplasm of upper-outer quadrant of right breast in female, estrogen receptor positive (HCC) 07/01/2022   Diverticulitis 12/13/2020   Smoking 11/22/2020   Liver abscess 10/17/2020   Medication monitoring encounter 10/17/2020   Streptococcal bacteremia     Intra-abdominal abscess (HCC) 10/03/2020   IDA (iron  deficiency anemia) 07/24/2020   IBD (inflammatory bowel disease) 07/24/2020   Protein-calorie malnutrition (HCC) 02/29/2016   Elevated hemoglobin A1c 02/27/2016   GERD (gastroesophageal reflux disease)    Tobacco use    Anemia of chronic disease    Prediabetes    Perirectal abscess 02/24/2016    Past Surgical History:  Procedure Laterality Date   BIOPSY  09/29/2020   Procedure: BIOPSY;  Surgeon: Charlanne Groom, MD;  Location: [facility] ENDOSCOPY;  Service: Endoscopy;;   BREAST BIOPSY Right 06/24/2022   US  RT BREAST BX W LOC DEV 1ST LESION IMG BX SPEC US  GUIDE 06/24/2022 [facility]   BREAST BIOPSY Right 06/24/2022   US  RT BREAST BX W LOC DEV EA ADD LESION IMG BX SPEC US  GUIDE 06/24/2022 [facility]   BREAST BIOPSY  08/13/2022   MM RT RADIOACTIVE SEED LOC MAMMO GUIDE 08/13/2022 [facility]   BREAST LUMPECTOMY WITH RADIOACTIVE SEED AND SENTINEL LYMPH NODE BIOPSY Right 08/14/2022   Procedure: RIGHT BREAST LUMPECTOMY WITH RADIOACTIVE SEED AND SENTINEL LYMPH NODE BIOPSY;  Surgeon: Aron Shoulders, MD;  Location: [facility];  Service: General;  Laterality: Right;   ESOPHAGOGASTRODUODENOSCOPY (EGD) WITH PROPOFOL  N/A 09/29/2020   Procedure: ESOPHAGOGASTRODUODENOSCOPY (EGD) WITH PROPOFOL ;  Surgeon: Charlanne Groom, MD;  Location: [facility] ENDOSCOPY;  Service: Endoscopy;  Laterality: N/A;   IR RADIOLOGIST EVAL & MGMT  10/17/2020   IRRIGATION AND DEBRIDEMENT ABSCESS N/A 02/24/2016   Procedure: IRRIGATION AND DEBRIDEMENT  ABSCESS;  Surgeon: Jina Nephew, MD;  Location: [facility] OR;  Service: General;  Laterality: N/A;    OB History   No obstetric history on file.      Home Medications    Prior to Admission medications   Medication Sig Start Date End Date Taking? Authorizing Provider  clindamycin  (CLEOCIN ) 300 MG capsule Take 1 capsule (300 mg total) by mouth 3 (three) times daily for 7 days. 09/09/23 09/16/23 Yes StanhopeDorna HERO,  FNP  gabapentin  (NEURONTIN ) 100 MG capsule Take 1 capsule (100 mg total) by mouth 3 (three) times daily. 10/29/22  Yes Shannon Agent, MD  mupirocin  ointment (BACTROBAN ) 2 % Apply 1 Application topically 2 (two) times daily. 09/09/23  Yes Enedelia Dorna HERO, FNP  pregabalin (LYRICA) 50 MG capsule Take by mouth. 05/28/23 05/27/24 Yes [provider]  bismuth  subsalicylate (PEPTO BISMOL) 262 MG/15ML suspension Take 30 mLs by mouth every 6 (six) hours as needed for indigestion. Patient not taking: Reported on 02/17/2023    [provider]  Multiple Vitamins-Minerals (WOMENS MULTIVITAMIN PO) Take 1 tablet by mouth daily.    [provider]  POTASSIUM PO Take 1 tablet by mouth daily. Patient not taking: Reported on 02/17/2023    [provider]    Family History Family History  Problem Relation Age of Onset   Diabetes Mother    COPD Father    Leukemia Brother    Colon cancer Neg Hx    Colon polyps Neg Hx    Esophageal cancer Neg Hx    Rectal cancer Neg Hx    Stomach cancer Neg Hx     Social History Social History   Tobacco Use   Smoking status: Former    Current packs/day: 0.33    Average packs/day: 0.3 packs/day for 28.0 years (9.2 ttl pk-yrs)    Types: Cigarettes   Smokeless tobacco: Never  Vaping Use   Vaping status: Never Used  Substance Use Topics   Alcohol use: Yes    Alcohol/week: 2.0 standard drinks of alcohol    Types: 2 Glasses of wine per week   Drug use: No     Allergies   Bactrim  [sulfamethoxazole -trimethoprim ], Percocet [oxycodone-acetaminophen ], and Valium [diazepam]   Review of Systems Review of Systems Per HPI  Physical Exam Triage Vital Signs ED Triage Vitals [09/09/23 1844]  Encounter Vitals Group     BP 116/72     Girls Systolic BP Percentile      Girls Diastolic BP Percentile      Boys Systolic BP Percentile      Boys Diastolic BP Percentile      Pulse Rate 99     Resp 18     Temp 98.5 F (36.9 C)      Temp Source Oral     SpO2 98 %     Weight      Height      Head Circumference      Peak Flow      Pain Score      Pain Loc      Pain Education      Exclude from Growth Chart    No data found.  Updated Vital Signs BP 116/72 (BP Location: Left Arm)   Pulse 99   Temp 98.5 F (36.9 C) (Oral)   Resp 18   LMP 10/11/2022 (Approximate)   SpO2 98%   Visual Acuity Right Eye Distance:   Left Eye Distance:   Bilateral Distance:    Right Eye Near:  Left Eye Near:    Bilateral Near:     Physical Exam Vitals and nursing note reviewed.  Constitutional:      Appearance: She is not ill-appearing or toxic-appearing.  HENT:     Head: Normocephalic and atraumatic.     Right Ear: Hearing and external ear normal.     Left Ear: Hearing and external ear normal.     Nose: Nose normal.     Mouth/Throat:     Lips: Pink.  Eyes:     General: Lids are normal. Vision grossly intact. Gaze aligned appropriately.     Extraocular Movements: Extraocular movements intact.     Conjunctiva/sclera: Conjunctivae normal.  Pulmonary:     Effort: Pulmonary effort is normal.  Musculoskeletal:     Cervical back: Neck supple.  Lymphadenopathy:     Cervical: Cervical adenopathy present.  Skin:    General: Skin is warm and dry.     Capillary Refill: Capillary refill takes less than 2 seconds.     Findings: Rash (see image of facial rash below.) present.     Comments: Erythematous, hyperpigmented, swollen, maculopapular/pustular facial rash. No oral lesions.   Neurological:     General: No focal deficit present.     Mental Status: She is alert and oriented to person, place, and time. Mental status is at baseline.     Cranial Nerves: No dysarthria or facial asymmetry.  Psychiatric:        Mood and Affect: Mood normal.        Speech: Speech normal.        Behavior: Behavior normal.        Thought Content: Thought content normal.        Judgment: Judgment normal.    Facial rash   UC Treatments /  Results  Labs (all labs ordered are listed, but only abnormal results are displayed) Labs Reviewed - No data to display  EKG   Radiology No results found.  Procedures Procedures (including critical care time)  Medications Ordered in UC Medications - No data to display  Initial Impression / Assessment and Plan / UC Course  I have reviewed the triage vital signs and the nursing notes.  Pertinent labs & imaging results that were available during my care of the patient were reviewed by me and considered in my medical decision making (see chart for details).   1.  Chronic folliculitis Reviewed previous notes from urgent care visit on June 01, 2023 and ER visit on August 12, 2023.  Patient was treated with doxycycline  at that time and facial rash was presumed to be folliculitis/acne vulgaris. No signs or symptoms indicating allergic nature of rash/contact dermatitis.  I would like to treat with clindamycin  3 times daily for 7 days and topical mupirocin  as she states the topical mupirocin  has helped significantly in the past.  Dermatology referral sent for her to go see Dr. Delon Lenis of [facility] dermat[facility].  Information provided to patient on discharge packet to call Dr. Lyndee office to schedule a follow-up appointment as well as we do not have a referrals coordinator here in this clinic.   Warm compresses to the face recommended to reduce swelling.  She may take Tylenol  as needed for pain.  Infection return precautions discussed.  Counseled patient on potential for adverse effects with medications prescribed/recommended today, strict ER and return-to-clinic precautions discussed, patient verbalized understanding.    Final Clinical Impressions(s) / UC Diagnoses   Final diagnoses:  Chronic folliculitis  Discharge Instructions      Take clindamycin  antibiotic every 8 hours for the next 7 days.  Apply mupirocin  ointment to the rash every 12 hours for the next 7  days.  Please call Delon Lenis dermatologist to schedule an appointment. A referral has been sent.     ED Prescriptions     Medication Sig Dispense Auth. Provider   clindamycin  (CLEOCIN ) 300 MG capsule Take 1 capsule (300 mg total) by mouth 3 (three) times daily for 7 days. 21 capsule Charolette Bultman M, FNP   mupirocin  ointment (BACTROBAN ) 2 % Apply 1 Application topically 2 (two) times daily. 22 g Enedelia Dorna HERO, FNP      PDMP not reviewed this encounter.   Enedelia Dorna HERO, [place] 09/10/23 1146

## 2023-09-11 ENCOUNTER — Telehealth (HOSPITAL_COMMUNITY): Payer: Self-pay | Admitting: *Deleted

## 2023-09-11 NOTE — Telephone Encounter (Signed)
 Pt  reports stanhope sent her in a referral to Derm but pt states they don't have any avail until 7 months from now. Wants a new referral for a different derm. I already informed her that that specialty is always booked up but she is not trying to hear that. Also, states the mupiricin ointment is not helping at all. states pain is getting worse. States she is taking the clindamycin  as prescribed.,    Spoke with pt she states she would like the cream she had back in march 2025 I have advised pt that she was given the same cream both times.   She states that the derm office we advised her to call is booked out 7 months and she can't wait that long. I have advised her with her insurance being [employer] she would need to get that referral from her PCP, advised her to call them to see if they would give a referral.   Pt states that she is taking the antibiotics but her face still hurts she has only taken one day of antibiotics. Advised pt if she would like to come back in to be reevaluated we are happy to see her but she can continue to take the antibiotic and see if there is any improvement.   Pt verbalized understanding to all information and states she will continue meds and call her PCP

## 2023-09-14 ENCOUNTER — Telehealth (HOSPITAL_COMMUNITY): Payer: Self-pay

## 2023-09-14 NOTE — Telephone Encounter (Signed)
 Pt called again stating the cream she was prescribed at her last visit is not helping. States in the past she had relief with ketoconazole . Requests script for same. Unable to get into Dermatology for several months.  Please advise if this can be sent in. Thanks.

## 2023-09-16 ENCOUNTER — Telehealth (HOSPITAL_COMMUNITY): Payer: Self-pay

## 2023-09-16 ENCOUNTER — Encounter (HOSPITAL_COMMUNITY): Payer: Self-pay

## 2023-09-16 ENCOUNTER — Ambulatory Visit (HOSPITAL_COMMUNITY)
Admission: EM | Admit: 2023-09-16 | Discharge: 2023-09-16 | Disposition: A | Discharge status: Home / Self Care | Attending: Physician Assistant | Admitting: Physician Assistant

## 2023-09-16 DIAGNOSIS — R21 Rash and other nonspecific skin eruption: Secondary | ICD-10-CM | POA: Insufficient documentation

## 2023-09-16 LAB — CBC WITH DIFFERENTIAL/PLATELET
Abs Immature Granulocytes: 0.03 K/uL (ref 0.00–0.07)
Basophils Absolute: 0 K/uL (ref 0.0–0.1)
Basophils Relative: 0 %
Eosinophils Absolute: 0.2 K/uL (ref 0.0–0.5)
Eosinophils Relative: 2 %
HCT: 34.8 % — ABNORMAL LOW (ref 36.0–46.0)
Hemoglobin: 11.3 g/dL — ABNORMAL LOW (ref 12.0–15.0)
Immature Granulocytes: 0 %
Lymphocytes Relative: 24 %
Lymphs Abs: 2 K/uL (ref 0.7–4.0)
MCH: 27.7 pg (ref 26.0–34.0)
MCHC: 32.5 g/dL (ref 30.0–36.0)
MCV: 85.3 fL (ref 80.0–100.0)
Monocytes Absolute: 0.6 K/uL (ref 0.1–1.0)
Monocytes Relative: 7 %
Neutro Abs: 5.6 K/uL (ref 1.7–7.7)
Neutrophils Relative %: 67 %
Platelets: 485 K/uL — ABNORMAL HIGH (ref 150–400)
RBC: 4.08 MIL/uL (ref 3.87–5.11)
RDW: 15 % (ref 11.5–15.5)
WBC: 8.4 K/uL (ref 4.0–10.5)
nRBC: 0 % (ref 0.0–0.2)

## 2023-09-16 LAB — COMPREHENSIVE METABOLIC PANEL WITH GFR
ALT: 14 U/L (ref 0–44)
AST: 13 U/L — ABNORMAL LOW (ref 15–41)
Albumin: 3.2 g/dL — ABNORMAL LOW (ref 3.5–5.0)
Alkaline Phosphatase: 85 U/L (ref 38–126)
Anion gap: 10 (ref 5–15)
BUN: 10 mg/dL (ref 6–20)
CO2: 27 mmol/L (ref 22–32)
Calcium: 9.6 mg/dL (ref 8.9–10.3)
Chloride: 100 mmol/L (ref 98–111)
Creatinine, Ser: 0.69 mg/dL (ref 0.44–1.00)
GFR, Estimated: >60 mL/min (ref >60)
Glucose, Bld: 82 mg/dL (ref 70–99)
Potassium: 3.5 mmol/L (ref 3.5–5.1)
Sodium: 137 mmol/L (ref 135–145)
Total Bilirubin: 0.4 mg/dL (ref 0.0–1.2)
Total Protein: 8.3 g/dL — ABNORMAL HIGH (ref 6.5–8.1)

## 2023-09-16 LAB — HIV ANTIBODY (ROUTINE TESTING W REFLEX): HIV Screen 4th Generation wRfx: NONREACTIVE

## 2023-09-16 MED ORDER — KETOCONAZOLE 2 % EX CREA: 1.0000 | TOPICAL_CREAM | Freq: Every day | CUTANEOUS | 0 refills | Status: DC

## 2023-09-16 MED ORDER — KETOCONAZOLE 2 % EX CREA: 1.0000 | TOPICAL_CREAM | Freq: Every day | CUTANEOUS | 0 refills | Status: AC

## 2023-09-16 NOTE — ED Notes (Signed)
 Patient is here for follow-up on face blister. Patient states she cannot see Dermatology for 38-months.Patient is requesting a new medication.

## 2023-09-16 NOTE — Telephone Encounter (Signed)
 Requested [employer]

## 2023-09-16 NOTE — ED Provider Notes (Signed)
 [facility]    CSN: [id number] Arrival date & time: 09/16/23  1524      History   Chief Complaint No chief complaint on file.   HPI Nicole Browning is a 53 y.o. female.   Patient presents today for follow-up of a nodular painful rash on her face.  She has been seen by several providers with symptoms diagnosed as chronic folliculitis.  She was initially treated with doxycycline  and Bactroban  but this was ineffective.  She was seen again and given topical ketoconazole  which provided temporary improvement of symptoms.  She ran out of this medication and was seen again and given another course of doxycycline  which was ineffective.  She was then switched to clindamycin  with last course last week but this provided no relief of symptoms.  She reports that the areas have become larger and more painful with discomfort rated 7 on a 0 to May scale, described as a pressure, no alleviating factors identified.  She denies any history of dermatological condition has never had nodular acne.  She was encouraged to follow-up with the dermatologist and when she called them to schedule appointment they would not be able to see her for about 7 months.  She denies any fever, nausea, vomiting.  Denies any changes to personal hygiene products or medications.  She does have a history of breast cancer but is not currently receiving any treatment.  She is anxious to go better she is scheduled to start a new job and reports that her symptoms are very bothersome to her.    Past Medical History:  Diagnosis Date   AKI (acute kidney injury) (HCC) 10/03/2020   Anemia    Anxiety    Cancer (HCC)    Breast   Depression    Diverticulitis 12/13/2020   GERD (gastroesophageal reflux disease)    History of radiation therapy    Right breast-10/08/22-11/28/22- Dr. Lynwood Nasuti   IBS (irritable bowel syndrome)    Perirectal abscess    Protein-calorie malnutrition (HCC) 02/29/2016   Tobacco use    UC (ulcerative  colitis) [facility])     Patient Active Problem List   Diagnosis Date Noted   Malignant neoplasm of upper-outer quadrant of right breast in female, estrogen receptor positive (HCC) 07/01/2022   Diverticulitis 12/13/2020   Smoking 11/22/2020   Liver abscess 10/17/2020   Medication monitoring encounter 10/17/2020   Streptococcal bacteremia    Intra-abdominal abscess (HCC) 10/03/2020   IDA (iron  deficiency anemia) 07/24/2020   IBD (inflammatory bowel disease) 07/24/2020   Protein-calorie malnutrition (HCC) 02/29/2016   Elevated hemoglobin A1c 02/27/2016   GERD (gastroesophageal reflux disease)    Tobacco use    Anemia of chronic disease    Prediabetes    Perirectal abscess 02/24/2016    Past Surgical History:  Procedure Laterality Date   BIOPSY  09/29/2020   Procedure: BIOPSY;  Surgeon: Charlanne Groom, MD;  Location: [facility] ENDOSCOPY;  Service: Endoscopy;;   BREAST BIOPSY Right 06/24/2022   US  RT BREAST BX W LOC DEV 1ST LESION IMG BX SPEC US  GUIDE 06/24/2022 [facility]   BREAST BIOPSY Right 06/24/2022   US  RT BREAST BX W LOC DEV EA ADD LESION IMG BX SPEC US  GUIDE 06/24/2022 [facility]   BREAST BIOPSY  08/13/2022   MM RT RADIOACTIVE SEED LOC MAMMO GUIDE 08/13/2022 [facility]   BREAST LUMPECTOMY WITH RADIOACTIVE SEED AND SENTINEL LYMPH NODE BIOPSY Right 08/14/2022   Procedure: RIGHT BREAST LUMPECTOMY WITH RADIOACTIVE SEED AND SENTINEL LYMPH NODE BIOPSY;  Surgeon: Aron Shoulders, MD;  Location: [facility];  Service: General;  Laterality: Right;   ESOPHAGOGASTRODUODENOSCOPY (EGD) WITH PROPOFOL  N/A 09/29/2020   Procedure: ESOPHAGOGASTRODUODENOSCOPY (EGD) WITH PROPOFOL ;  Surgeon: Charlanne Groom, MD;  Location: [facility] ENDOSCOPY;  Service: Endoscopy;  Laterality: N/A;   IR RADIOLOGIST EVAL & MGMT  10/17/2020   IRRIGATION AND DEBRIDEMENT ABSCESS N/A 02/24/2016   Procedure: IRRIGATION AND DEBRIDEMENT ABSCESS;  Surgeon: Shoulders Aron, MD;  Location: [facility] OR;  Service: General;   Laterality: N/A;    OB History   No obstetric history on file.      Home Medications    Prior to Admission medications   Medication Sig Start Date End Date Taking? Authorizing Provider  bismuth  subsalicylate (PEPTO BISMOL) 262 MG/15ML suspension Take 30 mLs by mouth every 6 (six) hours as needed for indigestion.   Yes [provider]  gabapentin  (NEURONTIN ) 100 MG capsule Take 1 capsule (100 mg total) by mouth 3 (three) times daily. 10/29/22  Yes Shannon Agent, MD  ketoconazole  (NIZORAL ) 2 % cream Apply 1 Application topically daily. 09/16/23  Yes Janani Chamber K, PA-C  Multiple Vitamins-Minerals (WOMENS MULTIVITAMIN PO) Take 1 tablet by mouth daily.   Yes [provider]  POTASSIUM PO Take 1 tablet by mouth daily.   Yes [provider]  pregabalin (LYRICA) 50 MG capsule Take by mouth. 05/28/23 05/27/24 Yes [provider]    Family History Family History  Problem Relation Age of Onset   Diabetes Mother    COPD Father    Leukemia Brother    Colon cancer Neg Hx    Colon polyps Neg Hx    Esophageal cancer Neg Hx    Rectal cancer Neg Hx    Stomach cancer Neg Hx     Social History Social History   Tobacco Use   Smoking status: Former    Current packs/day: 0.33    Average packs/day: 0.3 packs/day for 28.0 years (9.2 ttl pk-yrs)    Types: Cigarettes   Smokeless tobacco: Never  Vaping Use   Vaping status: Never Used  Substance Use Topics   Alcohol use: Yes    Alcohol/week: 2.0 standard drinks of alcohol    Types: 2 Glasses of wine per week   Drug use: No     Allergies   Bactrim  [sulfamethoxazole -trimethoprim ], Percocet [oxycodone-acetaminophen ], and Valium [diazepam]   Review of Systems Review of Systems  Constitutional:  Positive for activity change. Negative for appetite change, fatigue and fever.  Gastrointestinal:  Negative for abdominal pain, diarrhea, nausea and vomiting.  Skin:  Positive for rash.  Neurological:  Positive  for headaches. Negative for dizziness and light-headedness.     Physical Exam Triage Vital Signs ED Triage Vitals [09/16/23 1553]  Encounter Vitals Group     BP (!) 145/90     Girls Systolic BP Percentile      Girls Diastolic BP Percentile      Boys Systolic BP Percentile      Boys Diastolic BP Percentile      Pulse Rate 82     Resp 18     Temp 98.7 F (37.1 C)     Temp Source Oral     SpO2 98 %     Weight      Height      Head Circumference      Peak Flow      Pain Score      Pain Loc      Pain Education  Exclude from Growth Chart    No data found.  Updated Vital Signs BP (!) 145/90 (BP Location: Left Arm)   Pulse 82   Temp 98.7 F (37.1 C) (Oral)   Resp 18   LMP 10/11/2022 (Approximate)   SpO2 98%   Visual Acuity Right Eye Distance:   Left Eye Distance:   Bilateral Distance:    Right Eye Near:   Left Eye Near:    Bilateral Near:     Physical Exam Vitals reviewed.  Constitutional:      General: She is awake. She is not in acute distress.    Appearance: Normal appearance. She is well-developed. She is not ill-appearing.     Comments: Very pleasant female appears stated age in no acute distress sitting comfortably in exam room  HENT:     Head: Normocephalic and atraumatic.  Cardiovascular:     Rate and Rhythm: Normal rate and regular rhythm.     Heart sounds: Normal heart sounds, S1 normal and S2 normal. No murmur heard. Pulmonary:     Effort: Pulmonary effort is normal.     Breath sounds: Normal breath sounds. No wheezing, rhonchi or rales.     Comments: Clear to auscultation bilaterally Skin:    Findings: Rash present. Rash is nodular.         Comments: Scattered erythematous nodules that are tender to palpation along glabella and infraorbital region.  She has scattered lesions of her mental prominence.  No active bleeding or drainage.  Psychiatric:        Behavior: Behavior is cooperative.      UC Treatments / Results  Labs (all labs  ordered are listed, but only abnormal results are displayed) Labs Reviewed  COMPREHENSIVE METABOLIC PANEL WITH GFR  CBC WITH DIFFERENTIAL/PLATELET  RPR  HIV ANTIBODY (ROUTINE TESTING W REFLEX)    EKG   Radiology No results found.  Procedures Procedures (including critical care time)  Medications Ordered in UC Medications - No data to display  Initial Impression / Assessment and Plan / UC Course  I have reviewed the triage vital signs and the nursing notes.  Pertinent labs & imaging results that were available during my care of the patient were reviewed by me and considered in my medical decision making (see chart for details).     Patient is well-appearing, afebrile, nontoxic, nontachycardic.  Unclear etiology of symptoms but given she has failed several courses of appropriate antibiotics I have a low suspicion for persistent bacterial infection.  She reports improvement with ketoconazole  cream so I did provide a refill of this though a do not think that this is going to resolve her rash.  We discussed that ultimately she would need to have a biopsy done by a dermatologist in recommend that she contact the dermatologist to determine if they can put her on a cancellation list to move her appointment forward.  She was also given the contact information for several other local clinics to see if they can see her sooner.  I am not sure if this is a cutaneous manifestation of an immune problem or something else so I did offer her basic blood work to investigate potential causes though we did discuss that this is low yield and ultimately she would need to see the dermatologist for biopsy for definitive diagnosis.  CBC, CMP, RPR, HIV was obtained and is pending.  We will contact her if any of this is abnormal and changes her treatment plan.  We discussed that if  anything worsens or changes she needs to be seen immediately.  Strict return precautions given.  Final Clinical Impressions(s) / UC  Diagnoses   Final diagnoses:  Nodular rash     Discharge Instructions      As we discussed, I am not sure what is causing your rash.  I will contact you if any of your blood work is abnormal.  Since you have had success with the ketoconazole  in the past I have refilled this medication so apply it twice daily.  Follow-up with dermatology as soon as possible.  Call the other clinics to see if they can see you sooner.  If anything worsens or changes please be seen immediately.    ED Prescriptions     Medication Sig Dispense Auth. Provider   ketoconazole  (NIZORAL ) 2 % cream Apply 1 Application topically daily. 60 g Saadiya Wilfong K, PA-C      PDMP not reviewed this encounter.   Sherrell Rocky POUR, PA-C 09/16/23 1746

## 2023-09-16 NOTE — Discharge Instructions (Signed)
 As we discussed, I am not sure what is causing your rash.  I will contact you if any of your blood work is abnormal.  Since you have had success with the ketoconazole  in the past I have refilled this medication so apply it twice daily.  Follow-up with dermatology as soon as possible.  Call the other clinics to see if they can see you sooner.  If anything worsens or changes please be seen immediately.

## 2023-09-17 ENCOUNTER — Ambulatory Visit (HOSPITAL_COMMUNITY): Payer: Self-pay

## 2023-09-17 LAB — RPR: RPR Ser Ql: NONREACTIVE

## 2023-09-21 ENCOUNTER — Ambulatory Visit (INDEPENDENT_AMBULATORY_CARE_PROVIDER_SITE_OTHER): Admitting: Primary Care

## 2023-09-24 ENCOUNTER — Telehealth (HOSPITAL_COMMUNITY): Payer: Self-pay | Admitting: Family Medicine

## 2023-09-24 ENCOUNTER — Telehealth (HOSPITAL_COMMUNITY): Payer: Self-pay | Admitting: *Deleted

## 2023-09-24 NOTE — Telephone Encounter (Signed)
 Pt states she wants to know if her lab work could be causing her rash on her skin. Advised her labs are at baseline. She should follo wup with PCP. Pt verbalized understanding

## 2023-09-24 NOTE — Telephone Encounter (Signed)
 We have received a fax from [facility] dermatology stating that they are not in network with her insurance, which is Medicaid.  She was also referred on July 9 to Delon Lenis, a different dermatologist.  On review of that referral in its notes, the patient declined to schedule an appointment as they could not see her till February.  Please asked the patient to follow-up with her primary care so they can see where else they might can refer her for dermatology specialist.

## 2023-09-24 NOTE — Telephone Encounter (Signed)
 Spoke with pt she states she has already contacted her PCP. They are working on her referral .

## 2023-09-24 NOTE — Telephone Encounter (Signed)
 Called [employer]

## 2023-09-29 ENCOUNTER — Telehealth (INDEPENDENT_AMBULATORY_CARE_PROVIDER_SITE_OTHER): Admitting: Primary Care

## 2023-09-29 ENCOUNTER — Ambulatory Visit: Payer: Self-pay

## 2023-09-29 DIAGNOSIS — F411 Generalized anxiety disorder: Secondary | ICD-10-CM | POA: Diagnosis not present

## 2023-09-29 DIAGNOSIS — R21 Rash and other nonspecific skin eruption: Secondary | ICD-10-CM

## 2023-09-29 DIAGNOSIS — Z5900 Homelessness unspecified: Secondary | ICD-10-CM | POA: Diagnosis not present

## 2023-09-29 NOTE — Telephone Encounter (Signed)
 Will forward to provider

## 2023-09-29 NOTE — Telephone Encounter (Signed)
 FYI Only or Action Required?: FYI only for provider.  Patient was last seen in primary care on 03/16/2023 by Nicole Rosaline SQUIBB, NP.  Called Nurse Triage reporting Anxiety.  Symptoms began a week ago.  Interventions attempted: Nothing.  Symptoms are: gradually worsening.  Triage Disposition: See Physician Within 24 Hours  Patient/caregiver understands and will follow disposition?: Yes   Copied from CRM [id number]. Topic: Clinical - Red Word Triage >> Sep 29, 2023  8:41 AM Nicole Browning wrote: Red Word that prompted transfer to Nurse Triage: The patient called in stating she has been having anxiety attacks and facial pain. She says she has broke out on her face and has been seen recently at Urgent Care. I will transfer her to E2C2 NT Reason for Disposition  Patient sounds very upset or troubled to the triager  Answer Assessment - Initial Assessment Questions 1. CONCERN: Did anything happen that prompted you to call today?      Overwhelming with life: 2. ANXIETY SYMPTOMS: Can you describe how you (your loved one; patient) have been feeling? (e.g., tense, restless, panicky, anxious, keyed up, overwhelmed, sense of impending doom).      overwhelmed, 3. ONSET: How long have you been feeling this way? (e.g., hours, days, weeks)     Ongoing and worsening x 1 week 4. SEVERITY: How would you rate the level of anxiety? (e.g., 0 - 10; or mild, moderate, severe).     Moderate to severe 5. FUNCTIONAL IMPAIRMENT: How have these feelings affected your ability to do daily activities? Have you had more difficulty than usual doing your normal daily activities? (e.g., getting better, same, worse; self-care, school, work, interactions)     worse 6. HISTORY: Have you felt this way before? Have you ever been diagnosed with an anxiety problem in the past? (e.g., generalized anxiety disorder, panic attacks, PTSD). If Yes, ask: How was this problem treated? (e.g., medicines, counseling, etc.)      no 7. RISK OF HARM - SUICIDAL IDEATION: Do you ever have thoughts of hurting or killing yourself? If Yes, ask:  Do you have these feelings now? Do you have a plan on how you would do this?     no 8. TREATMENT:  What has been done so far to treat this anxiety? (e.g., medicines, relaxation strategies). What has helped?     no 9. THERAPIST: Do you have a counselor or therapist? If Yes, ask: What is their name?     no 10. POTENTIAL TRIGGERS: Do you drink caffeinated beverages (e.g., coffee, colas, teas), and how much daily? Do you drink alcohol or use any drugs? Have you started any new medicines recently?       no 11. PATIENT SUPPORT: Who is with you now? Who do you live with? Do you have family or friends who you can talk to?        No one 12. OTHER SYMPTOMS: Do you have any other symptoms? (e.g., feeling depressed, trouble concentrating, trouble sleeping, trouble breathing, palpitations or fast heartbeat, chest pain, sweating, nausea, or diarrhea)       trouble concentrating, trouble sleeping, trouble breathing, chest tightness only when stress level is high. 13. PREGNANCY: Is there any chance you are pregnant? When was your last menstrual period?       Na Pt stated she is not having thoughts of harming self or others.  Pt did state she has nothing else to live for, all her stuff is gone, her Mom & Dad gone and nothing  else for her: pt stated I wish something would happen to take me now.  Protocols used: Anxiety and Panic Attack-A-AH

## 2023-09-29 NOTE — Telephone Encounter (Addendum)
 Spoke patient . Patient  voiced that she feels like she going to have a nervous breakdown. Her face has a rash on it and she feels like It is caused by her anxiety. Voiced that her Mother and  bothers  ashes got thrown away out of the storage building they were being stored in . Voiced she does know what to do she just lost. Patient has a VV  with  provider at 10:50 am today.  Provider aware that patient is waiting  to start VV. Advised patient that her provider is aware that she is waiting and her Provider will be connecting with her around 12 pm today.

## 2023-09-30 ENCOUNTER — Emergency Department (HOSPITAL_COMMUNITY)

## 2023-09-30 ENCOUNTER — Ambulatory Visit: Payer: Self-pay | Admitting: Family Medicine

## 2023-09-30 ENCOUNTER — Other Ambulatory Visit: Payer: Self-pay

## 2023-09-30 ENCOUNTER — Inpatient Hospital Stay (HOSPITAL_COMMUNITY)
Admission: EM | Admit: 2023-09-30 | Discharge: 2023-10-04 | DRG: 391 | Disposition: A | Discharge status: Home / Self Care | Attending: Internal Medicine | Admitting: Internal Medicine

## 2023-09-30 ENCOUNTER — Encounter (HOSPITAL_COMMUNITY): Payer: Self-pay

## 2023-09-30 DIAGNOSIS — K631 Perforation of intestine (nontraumatic): Secondary | ICD-10-CM

## 2023-09-30 DIAGNOSIS — D7389 Other diseases of spleen: Secondary | ICD-10-CM | POA: Diagnosis not present

## 2023-09-30 DIAGNOSIS — Z860100 Personal history of colon polyps, unspecified: Secondary | ICD-10-CM | POA: Diagnosis not present

## 2023-09-30 DIAGNOSIS — Z881 Allergy status to other antibiotic agents status: Secondary | ICD-10-CM | POA: Diagnosis not present

## 2023-09-30 DIAGNOSIS — K769 Liver disease, unspecified: Secondary | ICD-10-CM | POA: Diagnosis not present

## 2023-09-30 DIAGNOSIS — K750 Abscess of liver: Secondary | ICD-10-CM | POA: Diagnosis present

## 2023-09-30 DIAGNOSIS — K219 Gastro-esophageal reflux disease without esophagitis: Secondary | ICD-10-CM | POA: Diagnosis present

## 2023-09-30 DIAGNOSIS — F32A Depression, unspecified: Secondary | ICD-10-CM | POA: Diagnosis present

## 2023-09-30 DIAGNOSIS — Z885 Allergy status to narcotic agent status: Secondary | ICD-10-CM | POA: Diagnosis not present

## 2023-09-30 DIAGNOSIS — Z806 Family history of leukemia: Secondary | ICD-10-CM

## 2023-09-30 DIAGNOSIS — Z8719 Personal history of other diseases of the digestive system: Secondary | ICD-10-CM | POA: Diagnosis not present

## 2023-09-30 DIAGNOSIS — Z79899 Other long term (current) drug therapy: Secondary | ICD-10-CM | POA: Diagnosis not present

## 2023-09-30 DIAGNOSIS — F17210 Nicotine dependence, cigarettes, uncomplicated: Secondary | ICD-10-CM | POA: Diagnosis present

## 2023-09-30 DIAGNOSIS — D733 Abscess of spleen: Secondary | ICD-10-CM | POA: Diagnosis present

## 2023-09-30 DIAGNOSIS — K651 Peritoneal abscess: Secondary | ICD-10-CM | POA: Diagnosis present

## 2023-09-30 DIAGNOSIS — K529 Noninfective gastroenteritis and colitis, unspecified: Secondary | ICD-10-CM | POA: Diagnosis not present

## 2023-09-30 DIAGNOSIS — K5792 Diverticulitis of intestine, part unspecified, without perforation or abscess without bleeding: Secondary | ICD-10-CM | POA: Diagnosis present

## 2023-09-30 DIAGNOSIS — Z923 Personal history of irradiation: Secondary | ICD-10-CM | POA: Diagnosis not present

## 2023-09-30 DIAGNOSIS — F411 Generalized anxiety disorder: Secondary | ICD-10-CM | POA: Diagnosis present

## 2023-09-30 DIAGNOSIS — Z833 Family history of diabetes mellitus: Secondary | ICD-10-CM | POA: Diagnosis not present

## 2023-09-30 DIAGNOSIS — Z825 Family history of asthma and other chronic lower respiratory diseases: Secondary | ICD-10-CM

## 2023-09-30 DIAGNOSIS — Z882 Allergy status to sulfonamides status: Secondary | ICD-10-CM

## 2023-09-30 DIAGNOSIS — Z5900 Homelessness unspecified: Secondary | ICD-10-CM

## 2023-09-30 DIAGNOSIS — E876 Hypokalemia: Secondary | ICD-10-CM | POA: Diagnosis present

## 2023-09-30 DIAGNOSIS — K5720 Diverticulitis of large intestine with perforation and abscess without bleeding: Principal | ICD-10-CM | POA: Diagnosis present

## 2023-09-30 DIAGNOSIS — Z853 Personal history of malignant neoplasm of breast: Secondary | ICD-10-CM | POA: Diagnosis not present

## 2023-09-30 DIAGNOSIS — K50814 Crohn's disease of both small and large intestine with abscess: Secondary | ICD-10-CM | POA: Diagnosis not present

## 2023-09-30 DIAGNOSIS — Z85841 Personal history of malignant neoplasm of brain: Secondary | ICD-10-CM | POA: Diagnosis not present

## 2023-09-30 DIAGNOSIS — R109 Unspecified abdominal pain: Principal | ICD-10-CM

## 2023-09-30 LAB — COMPREHENSIVE METABOLIC PANEL WITH GFR
ALT: 15 U/L (ref 0–44)
AST: 14 U/L — ABNORMAL LOW (ref 15–41)
Albumin: 3 g/dL — ABNORMAL LOW (ref 3.5–5.0)
Alkaline Phosphatase: 83 U/L (ref 38–126)
Anion gap: 11 (ref 5–15)
BUN: 8 mg/dL (ref 6–20)
CO2: 25 mmol/L (ref 22–32)
Calcium: 9.3 mg/dL (ref 8.9–10.3)
Chloride: 102 mmol/L (ref 98–111)
Creatinine, Ser: 0.82 mg/dL (ref 0.44–1.00)
GFR, Estimated: >60 mL/min (ref >60)
Glucose, Bld: 140 mg/dL — ABNORMAL HIGH (ref 70–99)
Potassium: 3.3 mmol/L — ABNORMAL LOW (ref 3.5–5.1)
Sodium: 138 mmol/L (ref 135–145)
Total Bilirubin: 0.8 mg/dL (ref 0.0–1.2)
Total Protein: 7.8 g/dL (ref 6.5–8.1)

## 2023-09-30 LAB — CBC WITH DIFFERENTIAL/PLATELET
Abs Immature Granulocytes: 0.02 K/uL (ref 0.00–0.07)
Basophils Absolute: 0 K/uL (ref 0.0–0.1)
Basophils Relative: 0 %
Eosinophils Absolute: 0.1 K/uL (ref 0.0–0.5)
Eosinophils Relative: 2 %
HCT: 34.4 % — ABNORMAL LOW (ref 36.0–46.0)
Hemoglobin: 10.9 g/dL — ABNORMAL LOW (ref 12.0–15.0)
Immature Granulocytes: 0 %
Lymphocytes Relative: 17 %
Lymphs Abs: 1.6 K/uL (ref 0.7–4.0)
MCH: 26.9 pg (ref 26.0–34.0)
MCHC: 31.7 g/dL (ref 30.0–36.0)
MCV: 84.9 fL (ref 80.0–100.0)
Monocytes Absolute: 0.5 K/uL (ref 0.1–1.0)
Monocytes Relative: 6 %
Neutro Abs: 7.2 K/uL (ref 1.7–7.7)
Neutrophils Relative %: 75 %
Platelets: 435 K/uL — ABNORMAL HIGH (ref 150–400)
RBC: 4.05 MIL/uL (ref 3.87–5.11)
RDW: 15 % (ref 11.5–15.5)
WBC: 9.6 K/uL (ref 4.0–10.5)
nRBC: 0 % (ref 0.0–0.2)

## 2023-09-30 LAB — TROPONIN I (HIGH SENSITIVITY)
Troponin I (High Sensitivity): <2 ng/L (ref <18)
Troponin I (High Sensitivity): <2 ng/L (ref <18)

## 2023-09-30 LAB — HCG, SERUM, QUALITATIVE: Preg, Serum: NEGATIVE

## 2023-09-30 LAB — D-DIMER, QUANTITATIVE: D-Dimer, Quant: 1.46 ug{FEU}/mL — ABNORMAL HIGH (ref 0.00–0.50)

## 2023-09-30 LAB — LIPASE, BLOOD: Lipase: 25 U/L (ref 11–51)

## 2023-09-30 MED ORDER — ONDANSETRON HCL 4 MG/2ML IJ SOLN
4.0000 mg | Freq: Once | INTRAMUSCULAR | Status: AC
  Administered 2023-09-30: 4 mg via INTRAVENOUS
  Filled 2023-09-30: qty 2

## 2023-09-30 MED ORDER — HYDROMORPHONE HCL 1 MG/ML IJ SOLN
0.5000 mg | INTRAMUSCULAR | Status: DC | PRN
  Administered 2023-09-30 – 2023-10-01 (×3): 1 mg via INTRAVENOUS
  Filled 2023-09-30 (×3): qty 1

## 2023-09-30 MED ORDER — IOHEXOL 350 MG/ML SOLN
75.0000 mL | Freq: Once | INTRAVENOUS | Status: AC | PRN
  Administered 2023-09-30: 75 mL via INTRAVENOUS

## 2023-09-30 MED ORDER — ACETAMINOPHEN 10 MG/ML IV SOLN: 1000.0000 mg | Freq: Three times a day (TID) | INTRAVENOUS | Status: DC | PRN

## 2023-09-30 MED ORDER — SODIUM CHLORIDE 0.9% FLUSH
3.0000 mL | Freq: Two times a day (BID) | INTRAVENOUS | Status: DC
  Administered 2023-09-30 – 2023-10-03 (×7): 3 mL via INTRAVENOUS

## 2023-09-30 MED ORDER — PIPERACILLIN-TAZOBACTAM 3.375 G IVPB 30 MIN
3.3750 g | Freq: Once | INTRAVENOUS | Status: AC
  Administered 2023-09-30: 3.375 g via INTRAVENOUS
  Filled 2023-09-30: qty 50

## 2023-09-30 MED ORDER — MORPHINE SULFATE (PF) 4 MG/ML IV SOLN
4.0000 mg | Freq: Once | INTRAVENOUS | Status: AC
  Administered 2023-09-30: 4 mg via INTRAVENOUS
  Filled 2023-09-30: qty 1

## 2023-09-30 MED ORDER — PIPERACILLIN-TAZOBACTAM 3.375 G IVPB
3.3750 g | Freq: Three times a day (TID) | INTRAVENOUS | Status: DC
  Administered 2023-10-01 – 2023-10-03 (×10): 3.375 g via INTRAVENOUS
  Filled 2023-09-30 (×10): qty 50

## 2023-09-30 MED ORDER — SODIUM CHLORIDE 0.9 % IV BOLUS
1000.0000 mL | Freq: Once | INTRAVENOUS | Status: AC
  Administered 2023-09-30: 1000 mL via INTRAVENOUS

## 2023-09-30 MED ORDER — HEPARIN SODIUM (PORCINE) 5000 UNIT/ML IJ SOLN: 5000.0000 [IU] | Freq: Three times a day (TID) | INTRAMUSCULAR | Status: DC

## 2023-09-30 MED ORDER — NICOTINE 14 MG/24HR TD PT24
14.0000 mg | MEDICATED_PATCH | Freq: Every day | TRANSDERMAL | Status: DC
  Administered 2023-09-30 – 2023-10-04 (×5): 14 mg via TRANSDERMAL
  Filled 2023-09-30 (×5): qty 1

## 2023-09-30 MED ORDER — ACETAMINOPHEN 325 MG PO TABS
650.0000 mg | ORAL_TABLET | Freq: Four times a day (QID) | ORAL | Status: DC | PRN
  Administered 2023-10-04: 650 mg via ORAL

## 2023-09-30 MED ORDER — POTASSIUM CHLORIDE 10 MEQ/100ML IV SOLN
10.0000 meq | INTRAVENOUS | Status: AC
  Administered 2023-09-30 (×2): 10 meq via INTRAVENOUS
  Filled 2023-09-30 (×2): qty 100

## 2023-09-30 MED ORDER — ONDANSETRON HCL 4 MG PO TABS
4.0000 mg | ORAL_TABLET | Freq: Four times a day (QID) | ORAL | Status: DC | PRN
  Administered 2023-10-01: 4 mg via ORAL
  Filled 2023-09-30: qty 1

## 2023-09-30 MED ORDER — LACTATED RINGERS IV SOLN: INTRAVENOUS | Status: AC

## 2023-09-30 MED ORDER — SODIUM CHLORIDE 0.9% FLUSH: 3.0000 mL | INTRAVENOUS | Status: DC | PRN

## 2023-09-30 MED ORDER — SODIUM CHLORIDE 0.9 % IV SOLN: 250.0000 mL | INTRAVENOUS | Status: AC | PRN

## 2023-09-30 MED ORDER — ONDANSETRON HCL 4 MG/2ML IJ SOLN
4.0000 mg | Freq: Four times a day (QID) | INTRAMUSCULAR | Status: DC | PRN
  Administered 2023-10-01 – 2023-10-03 (×2): 4 mg via INTRAVENOUS
  Filled 2023-09-30 (×2): qty 2

## 2023-09-30 NOTE — ED Notes (Signed)
 Report received, assumed care of patient at this time. Patient moved to ER 42.

## 2023-09-30 NOTE — H&P (Signed)
 History and Physical    Nicole Browning FMW:[id number] DOB: 07-29-1970 DOA: 09/30/2023  PCP: [name], NP   Patient coming from: Home   Chief Complaint:  Chief Complaint  Patient presents with   Abdominal Pain   Chest Pain   ED TRIAGE note:  Patient arrives ambulatory by POV c/o chest pain x 4 days and abdominal pain x 2 days. Patient states she is on antibiotics prescribed by UC for her face.     HPI:  Nicole Browning is a 53 y.o. female with medical history significant of breast cancer, depression, anxiety, diverticulitis, GERD, IBS, chronic smoking and ulcerative colitis who has been presented to emergency department [facility] with complaining of chest pain and abdominal pain for last 4 days.  Patient reported the chest pain started 4 days ago which is in the center of her chest radiates to upper abdomen.  Patient denies radiation to the back.  Denies any associated shortness of breath, diaphoresis and palpitation.  Reported mid central chest pain aching quality that radiates to the abdomen.  Reported that initially she has upper abdominal pain but now it is all over.  Reported that since then patient has been vomited couple of times last week.  Denies any fever, chills, constipation and diarrhea.   ED Course:  At presentation to ED patient is hemodynamically stable. CBC unremarkable stable H&H normal WBC and slightly elevated platelet count 435. CMP showed low potassium 3.4 otherwise unremarkable.  Normal lipase level. Troponin x 2 less than 2-hour without any delta change. Elevated D-dimer level 1.46. Negative pregnancy test. Blood cultures are pending.  Chest x-ray unremarkable. CTA chest no evidence of PE, pneumonia or pleural effusion. In the antero basal left lower lobe, there is a 3 mm nodule (axial 67). While this could represent a region of fibrolinear scarring, continued follow-up per routine oncologic protocols is recommended.  CT abdomen  pelvis showed acute diverticulitis of the ascending colon and possible blind-ending fistula or contained perforation. IMPRESSION: 1. Moderate wall thickening of the sigmoid colon with adjacent mesocolonic inflammation, worrisome for changes from recurrent diverticulitis. Thick-walled, peripherally enhancing fluid collection along the posterior sigmoid mesocolon (axial 64), measuring 4.4 x 2.9 x 2.8 cm. There is a tubular gas-filled tract from the posterior wall of the sigmoid colon extending to this fluid collection, suggesting that this either is a blind-ending fistula or contained perforation. 2. Acute diverticulitis of the proximal ascending colon. No peridiverticular abscess or pneumoperitoneum. 3. Increasing size of the hypodense masses in the spleen, the largest now measures 3.1 cm (previously, 2.3 cm). 4. Unchanged appearance of the hypodense lesion in the right hepatic dome, measuring 1.6 x 2.1 cm (previously, 1.5 x 2 cm). A couple of the additional hypodense abscesses in the right hepatic lobe have decreased in size. 5. For findings above the diaphragm, please see the separately dictated CT of the chest report, which was performed concurrently.  In the ED patient has been given Zosyn , 1 L of NS bolus, morphine .  ED physician consulted spoke with general surgery Dr. Teresa who recommended treat with IV antibiotics, keep patient n.p.o. and general surgery will evaluate patient soon.  Per ED physician patient does not have any acute abdominal sign and hemodynamically stable.  Hospitalist has been consulted for further management of acute diverticulitis with possible contained perforation.   Significant labs in the ED: Lab Orders         Culture, blood (routine x 2)  CBC with Differential         Comprehensive metabolic panel         Lipase, blood         hCG, serum, qualitative         D-dimer, quantitative       Review of Systems:  Review of Systems   Constitutional:  Negative for chills, fever, malaise/fatigue and weight loss.  Respiratory:  Negative for sputum production.   Cardiovascular:  Negative for chest pain and palpitations.  Gastrointestinal:  Positive for abdominal pain, nausea and vomiting. Negative for blood in stool, constipation, diarrhea, heartburn and melena.  Neurological:  Negative for dizziness and headaches.  Psychiatric/Behavioral:  The patient is not nervous/anxious.     Past Medical History:  Diagnosis Date   AKI (acute kidney injury) (HCC) 10/03/2020   Anemia    Anxiety    Cancer (HCC)    Breast   Depression    Diverticulitis 12/13/2020   GERD (gastroesophageal reflux disease)    History of radiation therapy    Right breast-10/08/22-11/28/22- Dr. Lynwood Nasuti   IBS (irritable bowel syndrome)    Perirectal abscess    Protein-calorie malnutrition (HCC) 02/29/2016   Tobacco use    UC (ulcerative colitis) (HCC)     Past Surgical History:  Procedure Laterality Date   BIOPSY  09/29/2020   Procedure: BIOPSY;  Surgeon: Charlanne Groom, MD;  Location: [facility] ENDOSCOPY;  Service: Endoscopy;;   BREAST BIOPSY Right 06/24/2022   US  RT BREAST BX W LOC DEV 1ST LESION IMG BX SPEC US  GUIDE 06/24/2022 [facility]   BREAST BIOPSY Right 06/24/2022   US  RT BREAST BX W LOC DEV EA ADD LESION IMG BX SPEC US  GUIDE 06/24/2022 [facility]   BREAST BIOPSY  08/13/2022   MM RT RADIOACTIVE SEED LOC MAMMO GUIDE 08/13/2022 [facility]   BREAST LUMPECTOMY WITH RADIOACTIVE SEED AND SENTINEL LYMPH NODE BIOPSY Right 08/14/2022   Procedure: RIGHT BREAST LUMPECTOMY WITH RADIOACTIVE SEED AND SENTINEL LYMPH NODE BIOPSY;  Surgeon: Aron Shoulders, MD;  Location: [facility];  Service: General;  Laterality: Right;   ESOPHAGOGASTRODUODENOSCOPY (EGD) WITH PROPOFOL  N/A 09/29/2020   Procedure: ESOPHAGOGASTRODUODENOSCOPY (EGD) WITH PROPOFOL ;  Surgeon: Charlanne Groom, MD;  Location[facility] END[facility]COPY;  Service: Endoscopy;   Laterality: N/A;   IR RADIOLOGIST EVAL & MGMT  10/17/2020   IRRIGATION AND DEBRIDEMENT ABSCESS N/A 02/24/2016   Procedure: IRRIGATION AND DEBRIDEMENT ABSCESS;  Surgeon: Shoulders Aron, MD;  Location: [facility] OR;  Service: General;  Laterality: N/A;     reports that she has quit smoking. Her smoking use included cigarettes. She has a 9.2 pack-year smoking history. She has never used smokeless tobacco. She reports current alcohol use of about 2.0 standard drinks of alcohol per week. She reports that she does not use drugs.  Allergies  Allergen Reactions   Bactrim  [Sulfamethoxazole -Trimethoprim ] Hives    Immediate body wide hives   Percocet [Oxycodone-Acetaminophen ] Nausea Only   Valium [Diazepam] Nausea Only    Family History  Problem Relation Age of Onset   Diabetes Mother    COPD Father    Leukemia Brother    Colon cancer Neg Hx    Colon polyps Neg Hx    Esophageal cancer Neg Hx    Rectal cancer Neg Hx    Stomach cancer Neg Hx     Prior to Admission medications   Medication Sig Start Date End Date Taking? Authorizing Provider  bismuth  subsalicylate (PEPTO BISMOL) 262 MG/15ML suspension Take 30 mLs  by mouth every 6 (six) hours as needed for indigestion.    [provider]  gabapentin  (NEURONTIN ) 100 MG capsule Take 1 capsule (100 mg total) by mouth 3 (three) times daily. 10/29/22   Shannon Agent, MD  ketoconazole  (NIZORAL ) 2 % cream Apply 1 Application topically daily. 09/16/23   Raspet, Erin K, PA-C  Multiple Vitamins-Minerals (WOMENS MULTIVITAMIN PO) Take 1 tablet by mouth daily.    [provider]  POTASSIUM PO Take 1 tablet by mouth daily.    [provider]  pregabalin (LYRICA) 50 MG capsule Take by mouth. 05/28/23 05/27/24  [provider]     Physical Exam: Vitals:   09/30/23 1921 09/30/23 2015 09/30/23 2030 09/30/23 2045  BP:  106/65 97/75 110/72  Pulse:  87 87 87  Resp:  (!) 21 (!) 28 16  Temp:      TempSrc:      SpO2:  100% 100% 99%   Weight: 55.8 kg     Height: 5' 6 (1.676 m)       Physical Exam Vitals and nursing note reviewed.  Constitutional:      General: She is not in acute distress.    Appearance: She is not ill-appearing.  Cardiovascular:     Rate and Rhythm: Regular rhythm. Tachycardia present.     Heart sounds: Normal heart sounds.  Abdominal:     General: Bowel sounds are normal.     Palpations: Abdomen is soft. There is no hepatomegaly or splenomegaly.     Tenderness: There is generalized abdominal tenderness. There is no guarding or rebound.  Skin:    Capillary Refill: Capillary refill takes less than 2 seconds.  Neurological:     Mental Status: She is alert and oriented to person, place, and time.  Psychiatric:        Mood and Affect: Mood normal.      Labs on Admission: I have personally reviewed following labs and imaging studies  CBC: Recent Labs  Lab 09/30/23 1420  WBC 9.6  NEUTROABS 7.2  HGB 10.9*  HCT 34.4*  MCV 84.9  PLT 435*   Basic Metabolic Panel: Recent Labs  Lab 09/30/23 1420  NA 138  K 3.3*  CL 102  CO2 25  GLUCOSE 140*  BUN 8  CREATININE 0.82  CALCIUM 9.3   GFR: Estimated Creatinine Clearance: 69.9 mL/min (by C-G formula based on SCr of 0.82 mg/dL). Liver Function Tests: Recent Labs  Lab 09/30/23 1420  AST 14*  ALT 15  ALKPHOS 83  BILITOT 0.8  PROT 7.8  ALBUMIN 3.0*   Recent Labs  Lab 09/30/23 1420  LIPASE 25   No results for input(s): AMMONIA in the last 168 hours. Coagulation Profile: No results for input(s): INR, PROTIME in the last 168 hours. Cardiac Enzymes: Recent Labs  Lab 09/30/23 1420 09/30/23 1616  TROPONINIHS <2 <2   BNP (last 3 results) No results for input(s): BNP in the last 8760 hours. HbA1C: No results for input(s): HGBA1C in the last 72 hours. CBG: No results for input(s): GLUCAP in the last 168 hours. Lipid Profile: No results for input(s): CHOL, HDL, LDLCALC, TRIG, CHOLHDL, LDLDIRECT in  the last 72 hours. Thyroid Function Tests: No results for input(s): TSH, T4TOTAL, FREET4, T3FREE, THYROIDAB in the last 72 hours. Anemia Panel: No results for input(s): VITAMINB12, FOLATE, FERRITIN, TIBC, IRON , RETICCTPCT in the last 72 hours. Urine analysis:    Component Value Date/Time   COLORURINE YELLOW 11/17/2022 1045   APPEARANCEUR CLEAR 11/17/2022  1045   LABSPEC 1.015 11/17/2022 1045   PHURINE 6.0 11/17/2022 1045   GLUCOSEU NEGATIVE 11/17/2022 1045   HGBUR LARGE (A) 11/17/2022 1045   BILIRUBINUR SMALL (A) 11/17/2022 1045   KETONESUR 15 (A) 11/17/2022 1045   PROTEINUR 100 (A) 11/17/2022 1045   NITRITE NEGATIVE 11/17/2022 1045   LEUKOCYTESUR NEGATIVE 11/17/2022 1045    Radiological Exams on Admission: I have personally reviewed images CT ABDOMEN PELVIS W CONTRAST Result Date: 09/30/2023 CLINICAL DATA:  Abdominal pain, acute, nonlocalized EXAM: CT ABDOMEN AND PELVIS WITH CONTRAST TECHNIQUE: Multidetector CT imaging of the abdomen and pelvis was performed using the standard protocol following bolus administration of intravenous contrast. RADIATION DOSE REDUCTION: This exam was performed according to the departmental dose-optimization program which includes automated exposure control, adjustment of the mA and/or kV according to patient size and/or use of iterative reconstruction technique. CONTRAST:  75mL OMNIPAQUE  IOHEXOL  350 MG/ML SOLN COMPARISON:  November 17, 2022 FINDINGS: Lower chest: For findings above the diaphragm, please see the separately dictated CT of the chest report, which was performed concurrently. Hepatobiliary: 1.6 x 2.1 cm hypodense lesion in the right hepatic dome, overall unchanged, previously measuring 2 x 1.5 cm.The small hypodense lesion in the right hepatic lobe (axial 10), is decreased in size in the interim, measuring 0.8 cm (previously 1.2 cm). Similar, wedge-shaped hypoenhancement in the posterior right hepatic lobe (segment 7). No  radiopaque stones or wall thickening of the gallbladder. No intrahepatic or extrahepatic biliary ductal dilation. The portal veins are patent. Pancreas: No mass or main ductal dilation. No peripancreatic inflammation or fluid collection. Spleen: A couple of hypodense masses again noted in the spleen, the largest measures 3.1 cm (previously 2.3 cm). Adrenals/Urinary Tract: No adrenal masses. Similar appearance of a couple of small cysts in the left kidney. No nephrolithiasis or hydronephrosis. Circumferential wall thickening of the urinary bladder. Stomach/Bowel: The stomach is decompressed without focal abnormality. No small bowel wall thickening or inflammation. No small bowel obstruction.Normal appendix. Extensive, total colonic diverticulosis. Subtle inflammatory stranding about a couple of diverticula in the proximal ascending colon (for example, axial 47). Moderate wall thickening of the sigmoid colon with adjacent mesenteric inflammation. There is a thick-walled fluid collection in the posterior sigmoid mesocolon (axial 64), measuring 4.4 x 2.9 x 2.8 cm, with a gas-filled tract extending from the anterior aspect to the wall of the sigmoid colon (axial 60-62). Vascular/Lymphatic: No aortic aneurysm. Scattered aortoiliac atherosclerosis. No intraabdominal or pelvic lymphadenopathy. Reproductive: Age-related atrophy of the uterus and ovaries. No concerning adnexal mass.No free pelvic fluid. Other: No pneumoperitoneum or ascites. Musculoskeletal: No acute fracture or destructive lesion. IMPRESSION: 1. Moderate wall thickening of the sigmoid colon with adjacent mesocolonic inflammation, worrisome for changes from recurrent diverticulitis. Thick-walled, peripherally enhancing fluid collection along the posterior sigmoid mesocolon (axial 64), measuring 4.4 x 2.9 x 2.8 cm. There is a tubular gas-filled tract from the posterior wall of the sigmoid colon extending to this fluid collection, suggesting that this either is  a blind-ending fistula or contained perforation. 2. Acute diverticulitis of the proximal ascending colon. No peridiverticular abscess or pneumoperitoneum. 3. Increasing size of the hypodense masses in the spleen, the largest now measures 3.1 cm (previously, 2.3 cm). 4. Unchanged appearance of the hypodense lesion in the right hepatic dome, measuring 1.6 x 2.1 cm (previously, 1.5 x 2 cm). A couple of the additional hypodense abscesses in the right hepatic lobe have decreased in size. 5. For findings above the diaphragm, please see the separately dictated CT of  the chest report, which was performed concurrently. Aortic Atherosclerosis (ICD10-I70.0). Electronically Signed   By: Rogelia Myers M.D.   On: 09/30/2023 18:43   CT Angio Chest PE W/Cm &/Or Wo Cm Result Date: 09/30/2023 CLINICAL DATA:  Pulmonary embolism (PE) suspected, low to intermediate prob, positive D-dimer EXAM: CT ANGIOGRAPHY CHEST WITH CONTRAST TECHNIQUE: Multidetector CT imaging of the chest was performed using the standard protocol during bolus administration of intravenous contrast. Multiplanar CT image reconstructions and MIPs were obtained to evaluate the vascular anatomy. RADIATION DOSE REDUCTION: This exam was performed according to the departmental dose-optimization program which includes automated exposure control, adjustment of the mA and/or kV according to patient size and/or use of iterative reconstruction technique. CONTRAST:  75mL OMNIPAQUE  IOHEXOL  350 MG/ML SOLN COMPARISON:  September 27, 2020 FINDINGS: Pulmonary Embolism: No pulmonary embolism. Cardiovascular: No cardiomegaly or pericardial effusion.No aortic aneurysm. Mediastinum/Nodes: No mediastinal mass. No mediastinal, hilar, or axillary lymphadenopathy. Right axillary lymph node dissection. Lungs/Pleura: The midline trachea and bronchi are patent. Posterior bibasilar dependent atelectasis. No focal airspace consolidation, pleural effusion, or pneumothorax. 3 mm nodule in the  anterior basal left lower lobe (axial 67). Musculoskeletal: No acute fracture or destructive bone lesion. Upper Abdomen: For findings below the diaphragm, please see the separately dictated CT of the abdomen and pelvis report, which was performed concurrently. Review of the MIP images confirms the above findings. IMPRESSION: 1. No acute intrathoracic abnormality; specifically, no pulmonary embolism, pneumonia, or pleural effusion. 2. In the antero basal left lower lobe, there is a 3 mm nodule (axial 67). While this could represent a region of fibrolinear scarring, continued follow-up per routine oncologic protocols is recommended. Electronically Signed   By: Rogelia Myers M.D.   On: 09/30/2023 18:29   DG Chest 2 View Result Date: 09/30/2023 CLINICAL DATA:  Chest pain and shortness of breath that began 3 days ago EXAM: CHEST - 2 VIEW COMPARISON:  X-ray 11/18/2022 FINDINGS: No consolidation, pneumothorax or effusion. No edema. Normal cardiopericardial silhouette. Surgical clips along the right axillary region. IMPRESSION: No acute cardiopulmonary disease. Electronically Signed   By: Ranell Bring M.D.   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  Patient reported that she has initial midepigastric abdominal pain  that has been generalized now also reported mid central chest pain which radiates to epigastric area.  Troponin x 2 and EKG unremarkable.  At this time there is no concern for ACS. - At presentation to ED hemodynamically stable.  CBC unremarkable.  CMP showed low potassium 3.3 otherwise unremarkable.  Normal hepatic function panel and lipase.  D-dimer secondary to inflammatory response. - Chest x-ray unremarkable. CTA chest ruled out PE.  It showed a small pulmonary lung nodule 3 mm which needs further outpatient oncology routine follow-up. - CT abdomen pelvis showed diverticulitis with associated tubular gas-filled area which represents blind-ending fistula or  contained perforation.  No evidence of abscess. - CT ABD showed multiple areas of hypodense lesion of the liver and spleen concerning for abscess however the liver abscess probably present since 11/17/2022 based on the previous imaging and radiology reading. -Per chart review patient follows [facility] Dr. Stacia outpatient. -Consulting GI for further evaluation for hepatic abscess and the splenic lesion. - ED physician consulted general surgery Dr. Teresa for the management of acute diverticulitis with perforation-recommended continue IV antibiotic, keep patient n.p.o. and will evaluate patient soon. -Continue IV Zosyn  with pharmacy consult Pending blood cultures result. - If general surgery or GI recommended that patient need drainage of the hepatic abscess in that case need to consult IR. - Keeping patient n.p.o. - Continue maintenance fluid LR 100 cc/h. Update and addendum - General Surgery Dr. Teresa has been evaluated patient recommended GI consult and patient will be benefit from scoping in future.  Recommended to treat with IV antibiotic and it is safe to continue diet.   Hypokalemia -Replating with IV KCl.  History of generalized anxiety disorder and depression Not currently on medication as  Chronic smoking  cigarette -Continue nicotine  patch   DVT prophylaxis:  SCDs.  Will avoid pharmacological DVT prophylaxis in case patient need to undergo drainage of the hepatic abscess. Code Status:  Full Code Diet: N.p.o. Family Communication:   Family was present at bedside, at the time of interview. Opportunity was given to ask question and all questions were answered satisfactorily.  Disposition Plan: Pending formal GI and general surgery consult Consults: Gastroenterology and general surgery Admission status:   Inpatient, Telemetry bed  Severity of Illness: The appropriate patient status for this patient is INPATIENT. Inpatient status is judged to be reasonable and necessary in order to provide the required intensity of service to ensure the patient's safety. The patient's presenting symptoms, physical exam findings, and initial radiographic and laboratory data in the context of their chronic comorbidities is felt to place them at high risk for further clinical deterioration. Furthermore, it is not anticipated that the patient will be medically stable for discharge from the hospital within 2 midnights of admission.   * I certify that at the point of admission it is my clinical judgment that the patient will require inpatient hospital care spanning beyond 2 midnights from the point of admission due to high intensity of service, high risk for further deterioration and high frequency of surveillance required.DEWAINE    Eunice Winecoff, MD [facility]  How to contact the [name] Attending or Consulting provider 7A - 7P or covering provider during after hours 7P -7A, for this patient.  Check the care team in [facility] and look for a) attending/consulting [name] provider listed and b) the [name] team listed Log into [email or web address] and use [facility] universal password to access. If you do not have the password, please contact the hospital operator. Locate the [name] provider you are looking for under [facility] and  page to a number that you can be directly reached. If you still have difficulty reaching the provider, please page the [facility] (Director on Call) for the Hospitalists listed on amion for assistance.  09/30/2023, 9:23 PM

## 2023-09-30 NOTE — ED Provider Notes (Signed)
 [place] [facility]ERGENCY DEPARTMENT AT [facility] Provider Note   CSN: [id number] Arrival date & time: 09/30/23  1402     Patient presents with: Abdominal Pain and Chest Pain  HPI Nicole Browning is a 53 y.o. female with history of diverticulitis, breast cancer, ulcerative colitis, anemia for chest pain and abdominal pain.  Chest pain started 4 days ago.  It is in the center of her chest and radiates into her upper abdomen.  Does not radiate to the back.  Denies associated shortness of breath.  Intermittent and feels like a achy pain.  Abdominal pain started 2 days ago.  She states it was initially in the upper abdomen but now all over.  States she vomited a couple times last week but not recently.  Endorses nausea but no diarrhea or fever.    Abdominal Pain Associated symptoms: chest pain   Chest Pain Associated symptoms: abdominal pain        Prior to Admission medications   Medication Sig Start Date End Date Taking? Authorizing Provider  bismuth  subsalicylate (PEPTO BISMOL) 262 MG/15ML suspension Take 30 mLs by mouth every 6 (six) hours as needed for indigestion.    [provider]  gabapentin  (NEURONTIN ) 100 MG capsule Take 1 capsule (100 mg total) by mouth 3 (three) times daily. 10/29/22   Shannon Agent, MD  ketoconazole  (NIZORAL ) 2 % cream Apply 1 Application topically daily. 09/16/23   Raspet, Erin K, PA-C  Multiple Vitamins-Minerals (WOMENS MULTIVITAMIN PO) Take 1 tablet by mouth daily.    [provider]  POTASSIUM PO Take 1 tablet by mouth daily.    [provider]  pregabalin (LYRICA) 50 MG capsule Take by mouth. 05/28/23 05/27/24  [provider]    Allergies: Bactrim  [sulfamethoxazole -trimethoprim ], Percocet [oxycodone-acetaminophen ], and Valium [diazepam]    Review of Systems  Cardiovascular:  Positive for chest pain.  Gastrointestinal:  Positive for abdominal pain.    Updated Vital Signs BP 125/84 (BP Location: Left Arm)    Pulse 84   Temp 98 F (36.7 C)   Resp 19   Ht 5' 6 (1.676 m)   Wt 55.8 kg   LMP 10/11/2022 (Approximate)   SpO2 100%   BMI 19.85 kg/m   Physical Exam Vitals and nursing note reviewed.  HENT:     Head: Normocephalic and atraumatic.     Mouth/Throat:     Mouth: Mucous membranes are moist.  Eyes:     General:        Right eye: No discharge.        Left eye: No discharge.     Conjunctiva/sclera: Conjunctivae normal.  Cardiovascular:     Rate and Rhythm: Normal rate and regular rhythm.     Pulses: Normal pulses.     Heart sounds: Normal heart sounds.  Pulmonary:     Effort: Pulmonary effort is normal.     Breath sounds: Normal breath sounds.  Abdominal:     General: Abdomen is flat. There is distension.     Palpations: Abdomen is soft.     Tenderness: There is generalized abdominal tenderness.  Skin:    General: Skin is warm and dry.  Neurological:     General: No focal deficit present.  Psychiatric:        Mood and Affect: Mood normal.     (all labs ordered are listed, but only abnormal results are displayed) Labs Reviewed  CBC WITH DIFFERENTIAL/PLATELET - Abnormal; Notable for the following components:  Result Value   Hemoglobin 10.9 (*)    HCT 34.4 (*)    Platelets 435 (*)    All other components within normal limits  COMPREHENSIVE METABOLIC PANEL WITH GFR - Abnormal; Notable for the following components:   Potassium 3.3 (*)    Glucose, Bld 140 (*)    Albumin 3.0 (*)    AST 14 (*)    All other components within normal limits  D-DIMER, QUANTITATIVE - Abnormal; Notable for the following components:   D-Dimer, Quant 1.46 (*)    All other components within normal limits  CULTURE, BLOOD (ROUTINE X 2)  CULTURE, BLOOD (ROUTINE X 2)  LIPASE, BLOOD  HCG, SERUM, QUALITATIVE  TROPONIN I (HIGH SENSITIVITY)  TROPONIN I (HIGH SENSITIVITY)    EKG: EKG Interpretation Date/Time:  Wednesday September 30 2023 14:12:57 EDT Ventricular Rate:  117 PR  Interval:  124 QRS Duration:  72 QT Interval:  324 QTC Calculation: 451 R Axis:   64  Text Interpretation: Sinus tachycardia Right atrial enlargement Borderline ECG When compared with ECG of 17-Nov-2022 09:38, PREVIOUS ECG IS PRESENT no stemi with rate increase Confirmed by Elnor Savant ([id number]) on 09/30/2023 2:36:43 PM  Radiology: CT ABDOMEN PELVIS W CONTRAST Result Date: 09/30/2023 CLINICAL DATA:  Abdominal pain, acute, nonlocalized EXAM: CT ABDOMEN AND PELVIS WITH CONTRAST TECHNIQUE: Multidetector CT imaging of the abdomen and pelvis was performed using the standard protocol following bolus administration of intravenous contrast. RADIATION DOSE REDUCTION: This exam was performed according to the departmental dose-optimization program which includes automated exposure control, adjustment of the mA and/or kV according to patient size and/or use of iterative reconstruction technique. CONTRAST:  75mL OMNIPAQUE  IOHEXOL  350 MG/ML SOLN COMPARISON:  November 17, 2022 FINDINGS: Lower chest: For findings above the diaphragm, please see the separately dictated CT of the chest report, which was performed concurrently. Hepatobiliary: 1.6 x 2.1 cm hypodense lesion in the right hepatic dome, overall unchanged, previously measuring 2 x 1.5 cm.The small hypodense lesion in the right hepatic lobe (axial 10), is decreased in size in the interim, measuring 0.8 cm (previously 1.2 cm). Similar, wedge-shaped hypoenhancement in the posterior right hepatic lobe (segment 7). No radiopaque stones or wall thickening of the gallbladder. No intrahepatic or extrahepatic biliary ductal dilation. The portal veins are patent. Pancreas: No mass or main ductal dilation. No peripancreatic inflammation or fluid collection. Spleen: A couple of hypodense masses again noted in the spleen, the largest measures 3.1 cm (previously 2.3 cm). Adrenals/Urinary Tract: No adrenal masses. Similar appearance of a couple of small cysts in the left kidney. No  nephrolithiasis or hydronephrosis. Circumferential wall thickening of the urinary bladder. Stomach/Bowel: The stomach is decompressed without focal abnormality. No small bowel wall thickening or inflammation. No small bowel obstruction.Normal appendix. Extensive, total colonic diverticulosis. Subtle inflammatory stranding about a couple of diverticula in the proximal ascending colon (for example, axial 47). Moderate wall thickening of the sigmoid colon with adjacent mesenteric inflammation. There is a thick-walled fluid collection in the posterior sigmoid mesocolon (axial 64), measuring 4.4 x 2.9 x 2.8 cm, with a gas-filled tract extending from the anterior aspect to the wall of the sigmoid colon (axial 60-62). Vascular/Lymphatic: No aortic aneurysm. Scattered aortoiliac atherosclerosis. No intraabdominal or pelvic lymphadenopathy. Reproductive: Age-related atrophy of the uterus and ovaries. No concerning adnexal mass.No free pelvic fluid. Other: No pneumoperitoneum or ascites. Musculoskeletal: No acute fracture or destructive lesion. IMPRESSION: 1. Moderate wall thickening of the sigmoid colon with adjacent mesocolonic inflammation, worrisome for changes from recurrent diverticulitis.  Thick-walled, peripherally enhancing fluid collection along the posterior sigmoid mesocolon (axial 64), measuring 4.4 x 2.9 x 2.8 cm. There is a tubular gas-filled tract from the posterior wall of the sigmoid colon extending to this fluid collection, suggesting that this either is a blind-ending fistula or contained perforation. 2. Acute diverticulitis of the proximal ascending colon. No peridiverticular abscess or pneumoperitoneum. 3. Increasing size of the hypodense masses in the spleen, the largest now measures 3.1 cm (previously, 2.3 cm). 4. Unchanged appearance of the hypodense lesion in the right hepatic dome, measuring 1.6 x 2.1 cm (previously, 1.5 x 2 cm). A couple of the additional hypodense abscesses in the right hepatic  lobe have decreased in size. 5. For findings above the diaphragm, please see the separately dictated CT of the chest report, which was performed concurrently. Aortic Atherosclerosis (ICD10-I70.0). Electronically Signed   By: Rogelia Myers M.D.   On: 09/30/2023 18:43   CT Angio Chest PE W/Cm &/Or Wo Cm Result Date: 09/30/2023 CLINICAL DATA:  Pulmonary embolism (PE) suspected, low to intermediate prob, positive D-dimer EXAM: CT ANGIOGRAPHY CHEST WITH CONTRAST TECHNIQUE: Multidetector CT imaging of the chest was performed using the standard protocol during bolus administration of intravenous contrast. Multiplanar CT image reconstructions and MIPs were obtained to evaluate the vascular anatomy. RADIATION DOSE REDUCTION: This exam was performed according to the departmental dose-optimization program which includes automated exposure control, adjustment of the mA and/or kV according to patient size and/or use of iterative reconstruction technique. CONTRAST:  75mL OMNIPAQUE  IOHEXOL  350 MG/ML SOLN COMPARISON:  September 27, 2020 FINDINGS: Pulmonary Embolism: No pulmonary embolism. Cardiovascular: No cardiomegaly or pericardial effusion.No aortic aneurysm. Mediastinum/Nodes: No mediastinal mass. No mediastinal, hilar, or axillary lymphadenopathy. Right axillary lymph node dissection. Lungs/Pleura: The midline trachea and bronchi are patent. Posterior bibasilar dependent atelectasis. No focal airspace consolidation, pleural effusion, or pneumothorax. 3 mm nodule in the anterior basal left lower lobe (axial 67). Musculoskeletal: No acute fracture or destructive bone lesion. Upper Abdomen: For findings below the diaphragm, please see the separately dictated CT of the abdomen and pelvis report, which was performed concurrently. Review of the MIP images confirms the above findings. IMPRESSION: 1. No acute intrathoracic abnormality; specifically, no pulmonary embolism, pneumonia, or pleural effusion. 2. In the antero basal left  lower lobe, there is a 3 mm nodule (axial 67). While this could represent a region of fibrolinear scarring, continued follow-up per routine oncologic protocols is recommended. Electronically Signed   By: Rogelia Myers M.D.   On: 09/30/2023 18:29   DG Chest 2 View Result Date: 09/30/2023 CLINICAL DATA:  Chest pain and shortness of breath that began 3 days ago EXAM: CHEST - 2 VIEW COMPARISON:  X-ray 11/18/2022 FINDINGS: No consolidation, pneumothorax or effusion. No edema. Normal cardiopericardial silhouette. Surgical clips along the right axillary region. IMPRESSION: No acute cardiopulmonary disease. Electronically Signed   By: Ranell Bring M.D.   On: 09/30/2023 15:05     .Critical Care  Performed by: Lang Norleen POUR, PA-C Authorized by: Lang Norleen POUR, PA-C   Critical care provider statement:    Critical care time (minutes):  30   Critical care was necessary to treat or prevent imminent or life-threatening deterioration of the following conditions: concern for perforated viscus.   Critical care was time spent personally by me on the following activities:  Development of treatment plan with patient or surrogate, discussions with consultants, evaluation of patient's response to treatment, examination of patient, ordering and review of laboratory studies, ordering and review  of radiographic studies, ordering and performing treatments and interventions, pulse oximetry, re-evaluation of patient's condition and review of old charts    Medications Ordered in the ED  potassium chloride  10 mEq in 100 mL IVPB (10 mEq Intravenous New Bag/Given 09/30/23 2111)  lactated ringers  infusion ( Intravenous New Bag/Given 09/30/23 2012)  sodium chloride  flush (NS) 0.9 % injection 3 mL (3 mLs Intravenous Given 09/30/23 2112)  sodium chloride  flush (NS) 0.9 % injection 3 mL (3 mLs Intravenous Given 09/30/23 2112)  sodium chloride  flush (NS) 0.9 % injection 3 mL (has no administration in time range)  0.9 %  sodium  chloride infusion (has no administration in time range)  HYDROmorphone  (DILAUDID ) injection 0.5-1 mg (has no administration in time range)  ondansetron  (ZOFRAN ) tablet 4 mg (has no administration in time range)    Or  ondansetron  (ZOFRAN ) injection 4 mg (has no administration in time range)  nicotine  (NICODERM CQ  - dosed in mg/24 hours) patch 14 mg (14 mg Transdermal Patch Applied 09/30/23 2013)  piperacillin -tazobactam (ZOSYN ) IVPB 3.375 g (has no administration in time range)  acetaminophen  (TYLENOL ) tablet 650 mg (has no administration in time range)  sodium chloride  0.9 % bolus 1,000 mL (0 mLs Intravenous Stopped 09/30/23 1900)  ondansetron  (ZOFRAN ) injection 4 mg (4 mg Intravenous Given 09/30/23 1754)  morphine  (PF) 4 MG/ML injection 4 mg (4 mg Intravenous Given 09/30/23 1755)  iohexol  (OMNIPAQUE ) 350 MG/ML injection 75 mL (75 mLs Intravenous Contrast Given 09/30/23 1818)  piperacillin -tazobactam (ZOSYN ) IVPB 3.375 g (0 g Intravenous Stopped 09/30/23 1942)                                    Medical Decision Making Amount and/or Complexity of Data Reviewed Radiology: ordered.  Risk Prescription drug management. Decision regarding hospitalization.   Initial Impression and Ddx 53 year old well-appearing female presenting for abdominal pain.  Exam notable for abdominal distention and generalized tenderness.  DDx includes acute diverticulitis, perforation, acute pancreatitis, appendicitis, kidney stones, PE, ACS, other. Patient PMH that increases complexity of ED encounter:  history of diverticulitis, breast cancer, ulcerative colitis, anemia  Interpretation of Diagnostics - I independent reviewed and interpreted the labs as followed: Elevated D-dimer  - I independently visualized the following imaging with scope of interpretation limited to determining acute life threatening conditions related to emergency care: CT angio chest was negative for PE.  CT abdomen pelvis revealed acute  diverticulitis and fluid collection about the posterior aspect of the sigmoid mesocolon, suspicious for perforation or fistula.  Discussed these findings with patient  -I personally reviewed and interpreted EKG which revealed sinus tachycardia  Patient Reassessment and Ultimate Disposition/Management Workup suggestive of acute diverticulitis and questionable sigmoid perforation versus fistula or abscess.  Placed on Zosyn .  At this time she does not meet septic criteria and is well-appearing.  Discussed patient with general surgery, Dr. Teresa advised continued IV antibiotic treatment and to admit to medicine and that surgery would be following her.  Admitted to hospital service with Dr. Sundil.  Remains well-appearing, hemodynamically stable and in no acute distress at this time.  Patient management required discussion with the following services or consulting groups:  Hospitalist Service and General/Trauma Surgery  Complexity of Problems Addressed Acute complicated illness or Injury  Additional Data Reviewed and Analyzed Further history obtained from: Past medical history and medications listed in the EMR and Prior ED visit notes  Patient Encounter Risk Assessment Consideration  of hospitalization      Final diagnoses:  Abdominal pain, unspecified abdominal location    ED Discharge Orders     None          Murline Weigel K, PA-C 09/30/23 2153    Yolande Lamar BROCKS, MD 09/30/23 [phone]

## 2023-09-30 NOTE — Telephone Encounter (Signed)
 Copied from CRM [id number]. Topic: Clinical - Red Word Triage >> Sep 30, 2023 10:28 AM Harlene ORN wrote: Red Word that prompted transfer to Nurse Triage: patient is having an anxiety attack Answer Assessment - Initial Assessment Questions 1. CONCERN: Did anything happen that prompted you to call today?      anxiety 2. ANXIETY SYMPTOMS: Can you describe how you (your loved one; patient) have been feeling? (e.g., tense, restless, panicky, anxious, keyed up, overwhelmed, sense of impending doom).      *No Answer* 3. ONSET: How long have you been feeling this way? (e.g., hours, days, weeks)     *No Answer* 4. SEVERITY: How would you rate the level of anxiety? (e.g., 0 - 10; or mild, moderate, severe).     *No Answer* 5. FUNCTIONAL IMPAIRMENT: How have these feelings affected your ability to do daily activities? Have you had more difficulty than usual doing your normal daily activities? (e.g., getting better, same, worse; self-care, school, work, interactions)     *No Answer* 6. HISTORY: Have you felt this way before? Have you ever been diagnosed with an anxiety problem in the past? (e.g., generalized anxiety disorder, panic attacks, PTSD). If Yes, ask: How was this problem treated? (e.g., medicines, counseling, etc.)     *No Answer* 7. RISK OF HARM - SUICIDAL IDEATION: Do you ever have thoughts of hurting or killing yourself? If Yes, ask:  Do you have these feelings now? Do you have a plan on how you would do this?     *No Answer* 8. TREATMENT:  What has been done so far to treat this anxiety? (e.g., medicines, relaxation strategies). What has helped?     *No Answer* 9. THERAPIST: Do you have a counselor or therapist? If Yes, ask: What is their name?     *No Answer* 10. POTENTIAL TRIGGERS: Do you drink caffeinated beverages (e.g., coffee, colas, teas), and how much daily? Do you drink alcohol or use any drugs? Have you started any new medicines recently?        *No Answer* 11. PATIENT SUPPORT: Who is with you now? Who do you live with? Do you have family or friends who you can talk to?        *No Answer* 12. OTHER SYMPTOMS: Do you have any other symptoms? (e.g., feeling depressed, trouble concentrating, trouble sleeping, trouble breathing, palpitations or fast heartbeat, chest pain, sweating, nausea, or diarrhea)       *No Answer* 13. PREGNANCY: Is there any chance you are pregnant? When was your last menstrual period?       *No Answer*  Protocols used: Anxiety and Panic Attack-A-AH  This encounter was created in error - please disregard.

## 2023-09-30 NOTE — Consult Note (Signed)
 CC/Reason for consult: Diverticulitis vs IBD Requesting provider: Lang Norleen POUR, PA-C    HPI: Nicole Browning is an 53 y.o. female with hx of tobacco use, GERD, breast cancer, perirectal abscesses, ?IBD who presented to the emergency department for evaluation of what she describes to be more right upper abdominal discomfort.  It has been going on for least a few days.  She does report some degree of chronicity with regards to abdominal symptoms but this seems slightly different.  No nausea or vomiting.  No fever or chills.  No radiation of pain.  No pain anywhere else in her abdomen.  She denies any history of constipation or diarrhea.  She reports she regularly has 3 bowel movements a day that are semiformed.  She denies any blood in her stool.  She denies any weight changes.  She has had a fairly complicated history with regards to her GI tract.  It has not been proven 1 or other whether not she has IBD or has had diverticulitis for that matter.  She has had prior perianal abscesses.  Colonoscopy with Dr. Eda 06/2020: - Perianal abscess found on perianal exam. - One 6 mm polyp in the ascending colon, removed with a cold snare. Resected and retrieved. - Diffuse mild inflammation was found in the entire examined colon secondary to pancolitis. Biopsied.  - Ileitis. Biopsied.  - The examination was otherwise normal on direct and retroflexion views.  PATH 1. Surgical [P], right colon bx - CHRONIC MODERATELY ACTIVE COLITIS. - NO DYSPLASIA OR MALIGNANCY. 2. Surgical [P], small bowel, terminal ileum bx - BENIGN SMALL BOWEL MUCOSA. - NO ACTIVE INFLAMMATION. - NO DYSPLASIA OR MALIGNANCY. 3. Surgical [P], colon, ascending, polyp (1) - INFLAMMATORY POLYP. - NO DYSPLASIA OR MALIGNANCY. 4. Surgical [P], colon, transverse bx - CHRONIC MILDLY ACTIVE COLITIS. - NO DYSPLASIA OR MALIGNANCY. 5. Surgical [P], left colon bx - CHRONIC MILDLY ACTIVE COLITIS. - NO DYSPLASIA OR MALIGNANCY. 6.  Surgical [P], colon, rectum bx - BENIGN COLONIC MUCOSA. - NO ACTIVE INFLAMMATION. - NO DYSPLASIA OR MALIGNANCY.   Last colonoscopy 03/2023 with Dr. Stacia: -- Preparation of the colon was poor. - Perianal scar found on perianal exam consistent with previous history of perianal abscess/drainage. - Stool in the rectum, in the sigmoid colon, in the descending colon and at the splenic flexure. - Non-bleeding internal hemorrhoids. - Visualization of the colon was quite limited due to extensive amount of colonic stool, as well as a narrowed/angulated sigmoid colon. The sigmoid mucosa was characterized by scattered areas of erythema as well as some larger polypoid erythematous lesions which were biopsied. - Diverticulosis suspected based on appearance of colon wall, although no discrete diverticula identified due to stool burden and anatomy of colon (angulated with deep folds). There was some whitish exudate, suggestive of possible diverticulitis. - Endoscopic findings not suggestive of Crohn's disease, but again, visualization quite limited  PATH      1. Surgical [P], colon, sigmoid :       -  1 FRAGMENT OF COLONIC MUCOSA WITH FOCAL EVIDENCE OF EROSION.       -  SEPARATE FRAGMENTS OF COLONIC MUCOSA WITH HYPERPLASTIC CHANGE.       NEGATIVE FOR DEFINITIVE EVIDENCE OF CHRONICITY, GRANULOMAS, VIRAL CYTOPATHIC       EFFECT OR DYSPLASIA.   Past Medical History:  Diagnosis Date   AKI (acute kidney injury) (HCC) 10/03/2020   Anemia    Anxiety    Cancer (HCC)    Breast  Depression    Diverticulitis 12/13/2020   GERD (gastroesophageal reflux disease)    History of radiation therapy    Right breast-10/08/22-11/28/22- Dr. Lynwood Nasuti   IBS (irritable bowel syndrome)    Perirectal abscess    Protein-calorie malnutrition (HCC) 02/29/2016   Tobacco use    UC (ulcerative colitis) (HCC)     Past Surgical History:  Procedure Laterality Date   BIOPSY  09/29/2020   Procedure: BIOPSY;  Surgeon:  Charlanne Groom, MD;  Location: [facility] ENDOSCOPY;  Service: Endoscopy;;   BREAST BIOPSY Right 06/24/2022   US  RT BREAST BX W LOC DEV 1ST LESION IMG BX SPEC US  GUIDE 06/24/2022 [facility]   BREAST BIOPSY Right 06/24/2022   US  RT BREAST BX W LOC DEV EA ADD LESION IMG BX SPEC US  GUIDE 06/24/2022 [facility]   BREAST BIOPSY  08/13/2022   MM RT RADIOACTIVE SEED LOC MAMMO GUIDE 08/13/2022 [facility]   BREAST LUMPECTOMY WITH RADIOACTIVE SEED AND SENTINEL LYMPH NODE BIOPSY Right 08/14/2022   Procedure: RIGHT BREAST LUMPECTOMY WITH RADIOACTIVE SEED AND SENTINEL LYMPH NODE BIOPSY;  Surgeon: Aron Shoulders, MD;  Location: [facility];  Service: General;  Laterality: Right;   ESOPHAGOGASTRODUODENOSCOPY (EGD) WITH PROPOFOL  N/A 09/29/2020   Procedure: ESOPHAGOGASTRODUODENOSCOPY (EGD) WITH PROPOFOL ;  Surgeon: Charlanne Groom, MD;  Location: [facility] ENDOSCOPY;  Service: Endoscopy;  Laterality: N/A;   IR RADIOLOGIST EVAL & MGMT  10/17/2020   IRRIGATION AND DEBRIDEMENT ABSCESS N/A 02/24/2016   Procedure: IRRIGATION AND DEBRIDEMENT ABSCESS;  Surgeon: Shoulders Aron, MD;  Location: [facility] OR;  Service: General;  Laterality: N/A;    Family History  Problem Relation Age of Onset   Diabetes Mother    COPD Father    Leukemia Brother    Colon cancer Neg Hx    Colon polyps Neg Hx    Esophageal cancer Neg Hx    Rectal cancer Neg Hx    Stomach cancer Neg Hx     Social:  reports that she has quit smoking. Her smoking use included cigarettes. She has a 9.2 pack-year smoking history. She has never used smokeless tobacco. She reports current alcohol use of about 2.0 standard drinks of alcohol per week. She reports that she does not use drugs.  Allergies:  Allergies  Allergen Reactions   Bactrim  [Sulfamethoxazole -Trimethoprim ] Hives    Immediate body wide hives   Percocet [Oxycodone-Acetaminophen ] Nausea Only   Valium [Diazepam] Nausea Only    Medications: I have reviewed the patient's current  medications.  Results for orders placed or performed during the hospital encounter of 09/30/23 (from the past 48 hours)  CBC with Differential     Status: Abnormal   Collection Time: 09/30/23  2:20 PM  Result Value Ref Range   WBC 9.6 4.0 - 10.5 K/uL   RBC 4.05 3.87 - 5.11 MIL/uL   Hemoglobin 10.9 (L) 12.0 - 15.0 g/dL   HCT 65.5 (L) 63.9 - 53.9 %   MCV 84.9 80.0 - 100.0 fL   MCH 26.9 26.0 - 34.0 pg   MCHC 31.7 30.0 - 36.0 g/dL   RDW 84.9 88.4 - 84.4 %   Platelets 435 (H) 150 - 400 K/uL   nRBC 0.0 0.0 - 0.2 %   Neutrophils Relative % 75 %   Neutro Abs 7.2 1.7 - 7.7 K/uL   Lymphocytes Relative 17 %   Lymphs Abs 1.6 0.7 - 4.0 K/uL   Monocytes Relative 6 %   Monocytes Absolute 0.5 0.1 - 1.0 K/uL   Eosinophils  Relative 2 %   Eosinophils Absolute 0.1 0.0 - 0.5 K/uL   Basophils Relative 0 %   Basophils Absolute 0.0 0.0 - 0.1 K/uL   Immature Granulocytes 0 %   Abs Immature Granulocytes 0.02 0.00 - 0.07 K/uL    Comment: Performed at [facility], [address] [place], [place] [address]  Comprehensive metabolic panel     Status: Abnormal   Collection Time: 09/30/23  2:20 PM  Result Value Ref Range   Sodium 138 135 - 145 mmol/L   Potassium 3.3 (L) 3.5 - 5.1 mmol/L   Chloride 102 98 - 111 mmol/L   CO2 25 22 - 32 mmol/L   Glucose, Bld 140 (H) 70 - 99 mg/dL    Comment: Glucose reference range applies only to samples taken after fasting for at least 8 hours.   BUN 8 6 - 20 mg/dL   Creatinine, Ser 9.17 0.44 - 1.00 mg/dL   Calcium 9.3 8.9 - 89.6 mg/dL   Total Protein 7.8 6.5 - 8.1 g/dL   Albumin 3.0 (L) 3.5 - 5.0 g/dL   AST 14 (L) 15 - 41 U/L   ALT 15 0 - 44 U/L   Alkaline Phosphatase 83 38 - 126 U/L   Total Bilirubin 0.8 0.0 - 1.2 mg/dL   GFR, Estimated >39 >39 mL/min    Comment: (NOTE) Calculated using the CKD-EPI Creatinine Equation (2021)    Anion gap 11 5 - 15    Comment: Performed at [facility], [address], [place], [place] [address]  Lipase, blood      Status: None   Collection Time: 09/30/23  2:20 PM  Result Value Ref Range   Lipase 25 11 - 51 U/L    Comment: Performed at [facility], [address], [place], [place] [address]  Troponin I (High Sensitivity)     Status: None   Collection Time: 09/30/23  2:20 PM  Result Value Ref Range   Troponin I (High Sensitivity) <2 <18 ng/L    Comment: (NOTE) Elevated high sensitivity troponin I (hsTnI) values and significant  changes across serial measurements may suggest ACS but many other  chronic and acute conditions are known to elevate hsTnI results.  Refer to the Links section for chest pain algorithms and additional  guidance. Performed at [facility], [address] [place], [place] [address]   hCG, serum, qualitative     Status: None   Collection Time: 09/30/23  2:20 PM  Result Value Ref Range   Preg, Serum NEGATIVE NEGATIVE    Comment:        THE SENSITIVITY OF THIS METHODOLOGY IS >10 mIU/mL. Performed at [facility], [address] [place], [place] [address]   D-dimer, quantitative     Status: Abnormal   Collection Time: 09/30/23  2:22 PM  Result Value Ref Range   D-Dimer, Quant 1.46 (H) 0.00 - 0.50 ug/mL-FEU    Comment: (NOTE) At the manufacturer cut-off value of 0.5 g/mL FEU, this assay has a negative predictive value of 95-100%.This assay is intended for use in conjunction with a clinical pretest probability (PTP) assessment model to exclude pulmonary embolism (PE) and deep venous thrombosis (DVT) in outpatients suspected of PE or DVT. Results should be correlated with clinical presentation. Performed at [facility], [address], [place], [place] [address]   Troponin I (High Sensitivity)     Status: None   Collection Time: 09/30/23  4:16 PM  Result Value Ref Range   Troponin I (High Sensitivity) <2 <18 ng/L    Comment: (NOTE) Elevated high sensitivity troponin I (hsTnI) values and significant  changes across serial measurements  may suggest ACS but many other  chronic and acute conditions are known to elevate hsTnI results.  Refer to the Links section for chest pain algorithms and additional  guidance. Performed at [facility], [address], [place], [place] [address]     CT ABDOMEN PELVIS W CONTRAST Result Date: 09/30/2023 CLINICAL DATA:  Abdominal pain, acute, nonlocalized EXAM: CT ABDOMEN AND PELVIS WITH CONTRAST TECHNIQUE: Multidetector CT imaging of the abdomen and pelvis was performed using the standard protocol following bolus administration of intravenous contrast. RADIATION DOSE REDUCTION: This exam was performed according to the departmental dose-optimization program which includes automated exposure control, adjustment of the mA and/or kV according to patient size and/or use of iterative reconstruction technique. CONTRAST:  75mL OMNIPAQUE  IOHEXOL  350 MG/ML SOLN COMPARISON:  November 17, 2022 FINDINGS: Lower chest: For findings above the diaphragm, please see the separately dictated CT of the chest report, which was performed concurrently. Hepatobiliary: 1.6 x 2.1 cm hypodense lesion in the right hepatic dome, overall unchanged, previously measuring 2 x 1.5 cm.The small hypodense lesion in the right hepatic lobe (axial 10), is decreased in size in the interim, measuring 0.8 cm (previously 1.2 cm). Similar, wedge-shaped hypoenhancement in the posterior right hepatic lobe (segment 7). No radiopaque stones or wall thickening of the gallbladder. No intrahepatic or extrahepatic biliary ductal dilation. The portal veins are patent. Pancreas: No mass or main ductal dilation. No peripancreatic inflammation or fluid collection. Spleen: A couple of hypodense masses again noted in the spleen, the largest measures 3.1 cm (previously 2.3 cm). Adrenals/Urinary Tract: No adrenal masses. Similar appearance of a couple of small cysts in the left kidney. No nephrolithiasis or hydronephrosis. Circumferential wall thickening of  the urinary bladder. Stomach/Bowel: The stomach is decompressed without focal abnormality. No small bowel wall thickening or inflammation. No small bowel obstruction.Normal appendix. Extensive, total colonic diverticulosis. Subtle inflammatory stranding about a couple of diverticula in the proximal ascending colon (for example, axial 47). Moderate wall thickening of the sigmoid colon with adjacent mesenteric inflammation. There is a thick-walled fluid collection in the posterior sigmoid mesocolon (axial 64), measuring 4.4 x 2.9 x 2.8 cm, with a gas-filled tract extending from the anterior aspect to the wall of the sigmoid colon (axial 60-62). Vascular/Lymphatic: No aortic aneurysm. Scattered aortoiliac atherosclerosis. No intraabdominal or pelvic lymphadenopathy. Reproductive: Age-related atrophy of the uterus and ovaries. No concerning adnexal mass.No free pelvic fluid. Other: No pneumoperitoneum or ascites. Musculoskeletal: No acute fracture or destructive lesion. IMPRESSION: 1. Moderate wall thickening of the sigmoid colon with adjacent mesocolonic inflammation, worrisome for changes from recurrent diverticulitis. Thick-walled, peripherally enhancing fluid collection along the posterior sigmoid mesocolon (axial 64), measuring 4.4 x 2.9 x 2.8 cm. There is a tubular gas-filled tract from the posterior wall of the sigmoid colon extending to this fluid collection, suggesting that this either is a blind-ending fistula or contained perforation. 2. Acute diverticulitis of the proximal ascending colon. No peridiverticular abscess or pneumoperitoneum. 3. Increasing size of the hypodense masses in the spleen, the largest now measures 3.1 cm (previously, 2.3 cm). 4. Unchanged appearance of the hypodense lesion in the right hepatic dome, measuring 1.6 x 2.1 cm (previously, 1.5 x 2 cm). A couple of the additional hypodense abscesses in the right hepatic lobe have decreased in size. 5.  For findings above the diaphragm, please  see the separately dictated CT of the chest report, which was performed concurrently. Aortic Atherosclerosis (ICD10-I70.0). Electronically Signed   By: Rogelia Myers M.D.   On: 09/30/2023 18:43   CT Angio Chest PE W/Cm &/Or Wo Cm Result Date: 09/30/2023 CLINICAL DATA:  Pulmonary embolism (PE) suspected, low to intermediate prob, positive D-dimer EXAM: CT ANGIOGRAPHY CHEST WITH CONTRAST TECHNIQUE: Multidetector CT imaging of the chest was performed using the standard protocol during bolus administration of intravenous contrast. Multiplanar CT image reconstructions and MIPs were obtained to evaluate the vascular anatomy. RADIATION DOSE REDUCTION: This exam was performed according to the departmental dose-optimization program which includes automated exposure control, adjustment of the mA and/or kV according to patient size and/or use of iterative reconstruction technique. CONTRAST:  75mL OMNIPAQUE  IOHEXOL  350 MG/ML SOLN COMPARISON:  September 27, 2020 FINDINGS: Pulmonary Embolism: No pulmonary embolism. Cardiovascular: No cardiomegaly or pericardial effusion.No aortic aneurysm. Mediastinum/Nodes: No mediastinal mass. No mediastinal, hilar, or axillary lymphadenopathy. Right axillary lymph node dissection. Lungs/Pleura: The midline trachea and bronchi are patent. Posterior bibasilar dependent atelectasis. No focal airspace consolidation, pleural effusion, or pneumothorax. 3 mm nodule in the anterior basal left lower lobe (axial 67). Musculoskeletal: No acute fracture or destructive bone lesion. Upper Abdomen: For findings below the diaphragm, please see the separately dictated CT of the abdomen and pelvis report, which was performed concurrently. Review of the MIP images confirms the above findings. IMPRESSION: 1. No acute intrathoracic abnormality; specifically, no pulmonary embolism, pneumonia, or pleural effusion. 2. In the antero basal left lower lobe, there is a 3 mm nodule (axial 67). While this could represent a  region of fibrolinear scarring, continued follow-up per routine oncologic protocols is recommended. Electronically Signed   By: Rogelia Myers M.D.   On: 09/30/2023 18:29   DG Chest 2 View Result Date: 09/30/2023 CLINICAL DATA:  Chest pain and shortness of breath that began 3 days ago EXAM: CHEST - 2 VIEW COMPARISON:  X-ray 11/18/2022 FINDINGS: No consolidation, pneumothorax or effusion. No edema. Normal cardiopericardial silhouette. Surgical clips along the right axillary region. IMPRESSION: No acute cardiopulmonary disease. Electronically Signed   By: Ranell Bring M.D.   On: 09/30/2023 15:05    ROS - all of the below systems have been reviewed with the patient and positives are indicated with bold text General: chills, fever or night sweats Eyes: blurry vision or double vision ENT: epistaxis or sore throat Allergy/Immunology: itchy/watery eyes or nasal congestion Hematologic/Lymphatic: bleeding problems, blood clots or swollen lymph nodes Endocrine: temperature intolerance or unexpected weight changes Breast: new or changing breast lumps or nipple discharge Resp: cough, shortness of breath, or wheezing CV: chest pain or dyspnea on exertion GI: as per HPI GU: dysuria, trouble voiding, or hematuria MSK: joint pain (chronic) or joint stiffness Neuro: TIA or stroke symptoms Derm: pruritus and skin lesion changes Psych: anxiety and depression  PE Blood pressure 106/65, pulse 87, temperature 98.1 F (36.7 C), temperature source Oral, resp. rate (!) 21, height 5' 6 (1.676 m), weight 55.8 kg, last menstrual period 10/11/2022, SpO2 100%. Constitutional: NAD; conversant; no deformities Eyes: Moist conjunctiva; no lid lag; anicteric; PERRL Neck: Trachea midline; no thyromegaly Lungs: Normal respiratory effort; no tactile fremitus CV: RRR; no palpable thrills; no pitting edema GI: Abd soft, nontender throughout, nondistended; no palpable hepatosplenomegaly MSK: Normal range of motion of  extremities; no clubbing/cyanosis Psychiatric: Appropriate affect; alert and oriented x3 Lymphatic: No palpable cervical or axillary lymphadenopathy  Results for orders  placed or performed during the hospital encounter of 09/30/23 (from the past 48 hours)  CBC with Differential     Status: Abnormal   Collection Time: 09/30/23  2:20 PM  Result Value Ref Range   WBC 9.6 4.0 - 10.5 K/uL   RBC 4.05 3.87 - 5.11 MIL/uL   Hemoglobin 10.9 (L) 12.0 - 15.0 g/dL   HCT 65.5 (L) 63.9 - 53.9 %   MCV 84.9 80.0 - 100.0 fL   MCH 26.9 26.0 - 34.0 pg   MCHC 31.7 30.0 - 36.0 g/dL   RDW 84.9 88.4 - 84.4 %   Platelets 435 (H) 150 - 400 K/uL   nRBC 0.0 0.0 - 0.2 %   Neutrophils Relative % 75 %   Neutro Abs 7.2 1.7 - 7.7 K/uL   Lymphocytes Relative 17 %   Lymphs Abs 1.6 0.7 - 4.0 K/uL   Monocytes Relative 6 %   Monocytes Absolute 0.5 0.1 - 1.0 K/uL   Eosinophils Relative 2 %   Eosinophils Absolute 0.1 0.0 - 0.5 K/uL   Basophils Relative 0 %   Basophils Absolute 0.0 0.0 - 0.1 K/uL   Immature Granulocytes 0 %   Abs Immature Granulocytes 0.02 0.00 - 0.07 K/uL    Comment: Performed at [facility], [address], [place], [place] [address]  Comprehensive metabolic panel     Status: Abnormal   Collection Time: 09/30/23  2:20 PM  Result Value Ref Range   Sodium 138 135 - 145 mmol/L   Potassium 3.3 (L) 3.5 - 5.1 mmol/L   Chloride 102 98 - 111 mmol/L   CO2 25 22 - 32 mmol/L   Glucose, Bld 140 (H) 70 - 99 mg/dL    Comment: Glucose reference range applies only to samples taken after fasting for at least 8 hours.   BUN 8 6 - 20 mg/dL   Creatinine, Ser 9.17 0.44 - 1.00 mg/dL   Calcium 9.3 8.9 - 89.6 mg/dL   Total Protein 7.8 6.5 - 8.1 g/dL   Albumin 3.0 (L) 3.5 - 5.0 g/dL   AST 14 (L) 15 - 41 U/L   ALT 15 0 - 44 U/L   Alkaline Phosphatase 83 38 - 126 U/L   Total Bilirubin 0.8 0.0 - 1.2 mg/dL   GFR, Estimated >39 >39 mL/min    Comment: (NOTE) Calculated using the CKD-EPI Creatinine Equation  (2021)    Anion gap 11 5 - 15    Comment: Performed at [facility], [address] [place], [place] [address]  Lipase, blood     Status: None   Collection Time: 09/30/23  2:20 PM  Result Value Ref Range   Lipase 25 11 - 51 U/L    Comment: Performed at [facility], [address] [place], [place] [address]  Troponin I (High Sensitivity)     Status: None   Collection Time: 09/30/23  2:20 PM  Result Value Ref Range   Troponin I (High Sensitivity) <2 <18 ng/L    Comment: (NOTE) Elevated high sensitivity troponin I (hsTnI) values and significant  changes across serial measurements may suggest ACS but many other  chronic and acute conditions are known to elevate hsTnI results.  Refer to the Links section for chest pain algorithms and additional  guidance. Performed at [facility], [address], [place], [place] [address]   hCG, serum, qualitative     Status: None   Collection Time: 09/30/23  2:20 PM  Result Value Ref Range   Preg, Serum NEGATIVE NEGATIVE    Comment:        THE SENSITIVITY OF THIS METHODOLOGY IS >10 mIU/mL. Performed at [facility], [address], [place], [place] [address]   D-dimer, quantitative     Status: Abnormal   Collection Time: 09/30/23  2:22 PM  Result Value Ref Range   D-Dimer, Quant 1.46 (H) 0.00 - 0.50 ug/mL-FEU    Comment: (NOTE) At the manufacturer cut-off value of 0.5 g/mL FEU, this assay has a negative predictive value of 95-100%.This assay is intended for use in conjunction with a clinical pretest probability (PTP) assessment model to exclude pulmonary embolism (PE) and deep venous thrombosis (DVT) in outpatients suspected of PE or DVT. Results should be correlated with clinical presentation. Performed at [facility], [address], [place], [place] [address]   Troponin I (High Sensitivity)     Status: None   Collection Time: 09/30/23  4:16 PM  Result Value Ref Range   Troponin I (High Sensitivity)  <2 <18 ng/L    Comment: (NOTE) Elevated high sensitivity troponin I (hsTnI) values and significant  changes across serial measurements may suggest ACS but many other  chronic and acute conditions are known to elevate hsTnI results.  Refer to the Links section for chest pain algorithms and additional  guidance. Performed at [facility], [address], [place], [place] [address]     CT ABDOMEN PELVIS W CONTRAST Result Date: 09/30/2023 CLINICAL DATA:  Abdominal pain, acute, nonlocalized EXAM: CT ABDOMEN AND PELVIS WITH CONTRAST TECHNIQUE: Multidetector CT imaging of the abdomen and pelvis was performed using the standard protocol following bolus administration of intravenous contrast. RADIATION DOSE REDUCTION: This exam was performed according to the departmental dose-optimization program which includes automated exposure control, adjustment of the mA and/or kV according to patient size and/or use of iterative reconstruction technique. CONTRAST:  75mL OMNIPAQUE  IOHEXOL  350 MG/ML SOLN COMPARISON:  November 17, 2022 FINDINGS: Lower chest: For findings above the diaphragm, please see the separately dictated CT of the chest report, which was performed concurrently. Hepatobiliary: 1.6 x 2.1 cm hypodense lesion in the right hepatic dome, overall unchanged, previously measuring 2 x 1.5 cm.The small hypodense lesion in the right hepatic lobe (axial 10), is decreased in size in the interim, measuring 0.8 cm (previously 1.2 cm). Similar, wedge-shaped hypoenhancement in the posterior right hepatic lobe (segment 7). No radiopaque stones or wall thickening of the gallbladder. No intrahepatic or extrahepatic biliary ductal dilation. The portal veins are patent. Pancreas: No mass or main ductal dilation. No peripancreatic inflammation or fluid collection. Spleen: A couple of hypodense masses again noted in the spleen, the largest measures 3.1 cm (previously 2.3 cm). Adrenals/Urinary Tract: No adrenal masses.  Similar appearance of a couple of small cysts in the left kidney. No nephrolithiasis or hydronephrosis. Circumferential wall thickening of the urinary bladder. Stomach/Bowel: The stomach is decompressed without focal abnormality. No small bowel wall thickening or inflammation. No small bowel obstruction.Normal appendix. Extensive, total colonic diverticulosis. Subtle inflammatory stranding about a couple of diverticula in the proximal ascending colon (for example, axial 47). Moderate wall thickening of the sigmoid colon with adjacent mesenteric inflammation. There is a thick-walled fluid collection in the posterior sigmoid mesocolon (axial 64), measuring 4.4 x 2.9 x 2.8 cm, with a gas-filled tract extending from the anterior aspect to the wall of the sigmoid colon (axial 60-62). Vascular/Lymphatic: No aortic aneurysm. Scattered aortoiliac atherosclerosis. No intraabdominal or  pelvic lymphadenopathy. Reproductive: Age-related atrophy of the uterus and ovaries. No concerning adnexal mass.No free pelvic fluid. Other: No pneumoperitoneum or ascites. Musculoskeletal: No acute fracture or destructive lesion. IMPRESSION: 1. Moderate wall thickening of the sigmoid colon with adjacent mesocolonic inflammation, worrisome for changes from recurrent diverticulitis. Thick-walled, peripherally enhancing fluid collection along the posterior sigmoid mesocolon (axial 64), measuring 4.4 x 2.9 x 2.8 cm. There is a tubular gas-filled tract from the posterior wall of the sigmoid colon extending to this fluid collection, suggesting that this either is a blind-ending fistula or contained perforation. 2. Acute diverticulitis of the proximal ascending colon. No peridiverticular abscess or pneumoperitoneum. 3. Increasing size of the hypodense masses in the spleen, the largest now measures 3.1 cm (previously, 2.3 cm). 4. Unchanged appearance of the hypodense lesion in the right hepatic dome, measuring 1.6 x 2.1 cm (previously, 1.5 x 2 cm). A  couple of the additional hypodense abscesses in the right hepatic lobe have decreased in size. 5. For findings above the diaphragm, please see the separately dictated CT of the chest report, which was performed concurrently. Aortic Atherosclerosis (ICD10-I70.0). Electronically Signed   By: Rogelia Myers M.D.   On: 09/30/2023 18:43   CT Angio Chest PE W/Cm &/Or Wo Cm Result Date: 09/30/2023 CLINICAL DATA:  Pulmonary embolism (PE) suspected, low to intermediate prob, positive D-dimer EXAM: CT ANGIOGRAPHY CHEST WITH CONTRAST TECHNIQUE: Multidetector CT imaging of the chest was performed using the standard protocol during bolus administration of intravenous contrast. Multiplanar CT image reconstructions and MIPs were obtained to evaluate the vascular anatomy. RADIATION DOSE REDUCTION: This exam was performed according to the departmental dose-optimization program which includes automated exposure control, adjustment of the mA and/or kV according to patient size and/or use of iterative reconstruction technique. CONTRAST:  75mL OMNIPAQUE  IOHEXOL  350 MG/ML SOLN COMPARISON:  September 27, 2020 FINDINGS: Pulmonary Embolism: No pulmonary embolism. Cardiovascular: No cardiomegaly or pericardial effusion.No aortic aneurysm. Mediastinum/Nodes: No mediastinal mass. No mediastinal, hilar, or axillary lymphadenopathy. Right axillary lymph node dissection. Lungs/Pleura: The midline trachea and bronchi are patent. Posterior bibasilar dependent atelectasis. No focal airspace consolidation, pleural effusion, or pneumothorax. 3 mm nodule in the anterior basal left lower lobe (axial 67). Musculoskeletal: No acute fracture or destructive bone lesion. Upper Abdomen: For findings below the diaphragm, please see the separately dictated CT of the abdomen and pelvis report, which was performed concurrently. Review of the MIP images confirms the above findings. IMPRESSION: 1. No acute intrathoracic abnormality; specifically, no pulmonary  embolism, pneumonia, or pleural effusion. 2. In the antero basal left lower lobe, there is a 3 mm nodule (axial 67). While this could represent a region of fibrolinear scarring, continued follow-up per routine oncologic protocols is recommended. Electronically Signed   By: Rogelia Myers M.D.   On: 09/30/2023 18:29   DG Chest 2 View Result Date: 09/30/2023 CLINICAL DATA:  Chest pain and shortness of breath that began 3 days ago EXAM: CHEST - 2 VIEW COMPARISON:  X-ray 11/18/2022 FINDINGS: No consolidation, pneumothorax or effusion. No edema. Normal cardiopericardial silhouette. Surgical clips along the right axillary region. IMPRESSION: No acute cardiopulmonary disease. Electronically Signed   By: Ranell Bring M.D.   On: 09/30/2023 15:05    A/P: Nicole Browning is an 53 y.o. female with tobacco use, GERD, breast cancer, perirectal abscesses, ?IBD - here with IBD flare vs diverticulitis  - Currently afebrile, normal vitals, completely benign exam, normal WBC.  She has been admitted for further workup and care.  Can empirically cover  for now with IV antibiotics. -She is hungry and has been yelling for food in the ER.  I am fine with her having a diet -GI evaluation - Constellation of findings to me seems more consistent with a IBD type process given her history, Dr. Germaine colonoscopy, and additionally finding on her current CT including what appears to me to be terminal ileitis. Will likely benefit from updating her scope in the future with a satisfactory prep - Needs to quit smoking, this is likely contributing to her disease process - We will follow with you    I spent a total of 80 minutes in both face-to-face and non-face-to-face activities, excluding procedures performed, for this visit on the date of this encounter.  Lonni Pizza, MD [facility]

## 2023-09-30 NOTE — ED Notes (Signed)
 Awaiting patient from lobby.

## 2023-09-30 NOTE — ED Triage Notes (Signed)
 Patient arrives ambulatory by POV c/o chest pain x 4 days and abdominal pain x 2 days. Patient states she is on antibiotics prescribed by UC for her face.

## 2023-09-30 NOTE — ED Notes (Signed)
 Patient reports feeling hot after medication administration. Provided patient with fan for comfort.

## 2023-09-30 NOTE — Telephone Encounter (Addendum)
  Kylia, Grajales contacted Me Me    09/30/23 10:29 AM Unsigned Note Copied from CRM #[id number]. Topic: Clinical - Red Word Triage >> Sep 30, 2023 10:28 AM Harlene ORN wrote: Red Word that prompted transfer to Nurse Triage: patient is having an anxiety attack          Pt called back reporting same issue with anxiety. Pt upset and crying.Pt stated she is calling 911 to go to ED. Pt stated PCP was supposed to call me back. Pt disconnected call.  Called [facility] and spoke with Kellen about pt statement about Rosaline. Pt did have OV yesterday and spoke with Cassandra. Pt previously canceled appt that was for this afternoon.

## 2023-09-30 NOTE — ED Provider Triage Note (Signed)
 Emergency Medicine Provider Triage Evaluation Note  Nicole Browning , a 53 y.o. female  was evaluated in triage.  Pt complains of pain across her chest for the past 4 days.  Reports this is nonexertional, nonpleuritic.  She also reports some diffuse abdominal pain for the past 2 days.  Reports some vomiting as well.  Denies fever or chills.  Review of Systems  Positive: As above Negative: As above  Physical Exam  BP 111/74 (BP Location: Right Arm)   Pulse (!) 110   Temp 98.2 F (36.8 C)   Resp 18   Wt 55.8 kg   LMP 10/11/2022 (Approximate)   SpO2 100%   BMI 19.56 kg/m  Gen:   Awake, no distress   Resp:  Normal effort  MSK:   Moves extremities without difficulty    Medical Decision Making  Medically screening exam initiated at 2:18 PM.  Appropriate orders placed.  Nicole Browning was informed that the remainder of the evaluation will be completed by another provider, this initial triage assessment does not replace that evaluation, and the importance of remaining in the ED until their evaluation is complete.     Nicole Palma, PA-C 09/30/23 1418

## 2023-09-30 NOTE — Telephone Encounter (Signed)
 Noted

## 2023-10-01 DIAGNOSIS — K50814 Crohn's disease of both small and large intestine with abscess: Secondary | ICD-10-CM | POA: Diagnosis not present

## 2023-10-01 DIAGNOSIS — K529 Noninfective gastroenteritis and colitis, unspecified: Secondary | ICD-10-CM

## 2023-10-01 DIAGNOSIS — K5720 Diverticulitis of large intestine with perforation and abscess without bleeding: Principal | ICD-10-CM

## 2023-10-01 DIAGNOSIS — K5792 Diverticulitis of intestine, part unspecified, without perforation or abscess without bleeding: Secondary | ICD-10-CM | POA: Diagnosis not present

## 2023-10-01 LAB — BASIC METABOLIC PANEL WITH GFR
Anion gap: 10 (ref 5–15)
BUN: 6 mg/dL (ref 6–20)
CO2: 26 mmol/L (ref 22–32)
Calcium: 9.1 mg/dL (ref 8.9–10.3)
Chloride: 100 mmol/L (ref 98–111)
Creatinine, Ser: 0.77 mg/dL (ref 0.44–1.00)
GFR, Estimated: >60 mL/min (ref >60)
Glucose, Bld: 94 mg/dL (ref 70–99)
Potassium: 3.9 mmol/L (ref 3.5–5.1)
Sodium: 136 mmol/L (ref 135–145)

## 2023-10-01 LAB — CBC
HCT: 31.8 % — ABNORMAL LOW (ref 36.0–46.0)
Hemoglobin: 10.1 g/dL — ABNORMAL LOW (ref 12.0–15.0)
MCH: 27.4 pg (ref 26.0–34.0)
MCHC: 31.8 g/dL (ref 30.0–36.0)
MCV: 86.4 fL (ref 80.0–100.0)
Platelets: 435 K/uL — ABNORMAL HIGH (ref 150–400)
RBC: 3.68 MIL/uL — ABNORMAL LOW (ref 3.87–5.11)
RDW: 15.1 % (ref 11.5–15.5)
WBC: 15.8 K/uL — ABNORMAL HIGH (ref 4.0–10.5)
nRBC: 0 % (ref 0.0–0.2)

## 2023-10-01 MED ORDER — POTASSIUM CHLORIDE CRYS ER 20 MEQ PO TBCR
40.0000 meq | EXTENDED_RELEASE_TABLET | Freq: Once | ORAL | Status: AC
  Administered 2023-10-01: 40 meq via ORAL
  Filled 2023-10-01: qty 2

## 2023-10-01 NOTE — Progress Notes (Addendum)
 [facility] PROGRESS NOTE    Progress Note  Nicole Browning  FMW:[id number] DOB: [birth year] DOA: 09/30/2023 PCP: [name], NP     Brief Narrative:   Nicole Browning is an 53 y.o. female past medical history significant for breast cancer, diverticulitis, chronic smoking and  Crohn's disease comes into the ED complaining of abdominal pain that started 4 days prior to admission and radiates to her chest in the ED she was found to febrile with thrombocytosis CT angio of the chest showed no acute PE and CT scan of the abdomen showed sigmoid diverticulitis with an abscess measuring 4 x 3 x 3 with possible fistula or perforation, increase in size splenic mass   Assessment/Plan:   Crohn's disease flareup with possible abscess /complicated acute diverticulitis/ Hepatic lesion/splenic lesion: She was started on IV fluids empiric antibiotics. She is afebrile General surgery was consulted recommended to treat conservatively they are okay  starting a diet. 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 Will consult GI she will probably need to be started on steroids.  Hypokalemia Replete orally recheck in the morning.  History of general anxiety disorder and depression: Currently on no medications will need to follow-up with PCP as an outpatient.  History of breast cancer Noted.  Tobacco abuse: Continue getting patch   DVT prophylaxis: lovenox  Family Communication:none Status is: Inpatient Remains inpatient appropriate because: Acute ulcerative colitis flare    Code Status:     Code Status Orders  (From admission, onward)           Start     Ordered   09/30/23 1932  Full code  Continuous       Question:  By:  Answer:  Consent: discussion documented in EHR   09/30/23 1931           Code Status History     Date Active Date Inactive Code Status Order ID Comments User Context    11/17/2022 1232 11/20/2022 2124 Full Code [id number]  Seena Marsa NOVAK, MD ED   09/25/2020 2329 10/05/2020 2317 Full Code [id number]  Franky Redia SAILOR, MD ED   02/24/2016 1903 02/29/2016 1639 Full Code [id number]  Aron Shoulders, MD Inpatient         IV Access:   Peripheral IV   Procedures and diagnostic studies:   CT ABDOMEN PELVIS W CONTRAST Result Date: 09/30/2023 CLINICAL DATA:  Abdominal pain, acute, nonlocalized EXAM: CT ABDOMEN AND PELVIS WITH CONTRAST TECHNIQUE: Multidetector CT imaging of the abdomen and pelvis was performed using the standard protocol following bolus administration of intravenous contrast. RADIATION DOSE REDUCTION: This exam was performed according to the departmental dose-optimization program which includes automated exposure control, adjustment of the mA and/or kV according to patient size and/or use of iterative reconstruction technique. CONTRAST:  75mL OMNIPAQUE  IOHEXOL  350 MG/ML SOLN COMPARISON:  November 17, 2022 FINDINGS: Lower chest: For findings above the diaphragm, please see the separately dictated CT of the chest report, which was performed concurrently. Hepatobiliary: 1.6 x 2.1 cm hypodense lesion in the right hepatic dome, overall unchanged, previously measuring 2 x 1.5 cm.The small hypodense lesion in the right hepatic lobe (axial 10), is decreased in size in the interim, measuring 0.8 cm (previously 1.2 cm). Similar, wedge-shaped hypoenhancement in the posterior right hepatic lobe (segment 7). No radiopaque stones or wall thickening of the gallbladder. No intrahepatic or extrahepatic biliary ductal dilation. The portal veins are patent. Pancreas: No mass or main  ductal dilation. No peripancreatic inflammation or fluid collection. Spleen: A couple of hypodense masses again noted in the spleen, the largest measures 3.1 cm (previously 2.3 cm). Adrenals/Urinary Tract: No adrenal masses. Similar appearance of a couple of small cysts in the left kidney. No  nephrolithiasis or hydronephrosis. Circumferential wall thickening of the urinary bladder. Stomach/Bowel: The stomach is decompressed without focal abnormality. No small bowel wall thickening or inflammation. No small bowel obstruction.Normal appendix. Extensive, total colonic diverticulosis. Subtle inflammatory stranding about a couple of diverticula in the proximal ascending colon (for example, axial 47). Moderate wall thickening of the sigmoid colon with adjacent mesenteric inflammation. There is a thick-walled fluid collection in the posterior sigmoid mesocolon (axial 64), measuring 4.4 x 2.9 x 2.8 cm, with a gas-filled tract extending from the anterior aspect to the wall of the sigmoid colon (axial 60-62). Vascular/Lymphatic: No aortic aneurysm. Scattered aortoiliac atherosclerosis. No intraabdominal or pelvic lymphadenopathy. Reproductive: Age-related atrophy of the uterus and ovaries. No concerning adnexal mass.No free pelvic fluid. Other: No pneumoperitoneum or ascites. Musculoskeletal: No acute fracture or destructive lesion. IMPRESSION: 1. Moderate wall thickening of the sigmoid colon with adjacent mesocolonic inflammation, worrisome for changes from recurrent diverticulitis. Thick-walled, peripherally enhancing fluid collection along the posterior sigmoid mesocolon (axial 64), measuring 4.4 x 2.9 x 2.8 cm. There is a tubular gas-filled tract from the posterior wall of the sigmoid colon extending to this fluid collection, suggesting that this either is a blind-ending fistula or contained perforation. 2. Acute diverticulitis of the proximal ascending colon. No peridiverticular abscess or pneumoperitoneum. 3. Increasing size of the hypodense masses in the spleen, the largest now measures 3.1 cm (previously, 2.3 cm). 4. Unchanged appearance of the hypodense lesion in the right hepatic dome, measuring 1.6 x 2.1 cm (previously, 1.5 x 2 cm). A couple of the additional hypodense abscesses in the right hepatic  lobe have decreased in size. 5. For findings above the diaphragm, please see the separately dictated CT of the chest report, which was performed concurrently. Aortic Atherosclerosis (ICD10-I70.0). Electronically Signed   By: Rogelia Myers M.D.   On: 09/30/2023 18:43   CT Angio Chest PE W/Cm &/Or Wo Cm Result Date: 09/30/2023 CLINICAL DATA:  Pulmonary embolism (PE) suspected, low to intermediate prob, positive D-dimer EXAM: CT ANGIOGRAPHY CHEST WITH CONTRAST TECHNIQUE: Multidetector CT imaging of the chest was performed using the standard protocol during bolus administration of intravenous contrast. Multiplanar CT image reconstructions and MIPs were obtained to evaluate the vascular anatomy. RADIATION DOSE REDUCTION: This exam was performed according to the departmental dose-optimization program which includes automated exposure control, adjustment of the mA and/or kV according to patient size and/or use of iterative reconstruction technique. CONTRAST:  75mL OMNIPAQUE  IOHEXOL  350 MG/ML SOLN COMPARISON:  September 27, 2020 FINDINGS: Pulmonary Embolism: No pulmonary embolism. Cardiovascular: No cardiomegaly or pericardial effusion.No aortic aneurysm. Mediastinum/Nodes: No mediastinal mass. No mediastinal, hilar, or axillary lymphadenopathy. Right axillary lymph node dissection. Lungs/Pleura: The midline trachea and bronchi are patent. Posterior bibasilar dependent atelectasis. No focal airspace consolidation, pleural effusion, or pneumothorax. 3 mm nodule in the anterior basal left lower lobe (axial 67). Musculoskeletal: No acute fracture or destructive bone lesion. Upper Abdomen: For findings below the diaphragm, please see the separately dictated CT of the abdomen and pelvis report, which was performed concurrently. Review of the MIP images confirms the above findings. IMPRESSION: 1. No acute intrathoracic abnormality; specifically, no pulmonary embolism, pneumonia, or pleural effusion. 2. In the antero basal left  lower lobe, there is a 3  mm nodule (axial 67). While this could represent a region of fibrolinear scarring, continued follow-up per routine oncologic protocols is recommended. Electronically Signed   By: Rogelia Myers M.D.   On: 09/30/2023 18:29   DG Chest 2 View Result Date: 09/30/2023 CLINICAL DATA:  Chest pain and shortness of breath that began 3 days ago EXAM: CHEST - 2 VIEW COMPARISON:  X-ray 11/18/2022 FINDINGS: No consolidation, pneumothorax or effusion. No edema. Normal cardiopericardial silhouette. Surgical clips along the right axillary region. IMPRESSION: No acute cardiopulmonary disease. Electronically Signed   By: Ranell Bring M.D.   On: 09/30/2023 15:05     Medical Consultants:   None.   Subjective:    Nicole Browning she relates her abdominal pain is about the same.  Objective:    Vitals:   09/30/23 2330 09/30/23 2345 10/01/23 0055 10/01/23 0514  BP: 103/68 99/63 130/82 114/72  Pulse: 81 79 76 77  Resp: 11 (!) 23 18 17   Temp:   98.4 F (36.9 C) 98.2 F (36.8 C)  TempSrc:   Oral Oral  SpO2: 99% 99% 100% 100%  Weight:      Height:       SpO2: 100 %   Intake/Output Summary (Last 24 hours) at 10/01/2023 0552 Last data filed at 10/01/2023 0020 Gross per 24 hour  Intake 1597.61 ml  Output --  Net 1597.61 ml   Filed Weights   09/30/23 1414 09/30/23 1921  Weight: 55.8 kg 55.8 kg    Exam: General exam: In no acute distress. Respiratory system: Good air movement and clear to auscultation. Cardiovascular system: S1 & S2 heard, RRR. No JVD. Gastrointestinal system: Abdomen is nondistended, soft and diffuse tenderness Central nervous system: Alert and oriented. No focal neurological deficits. Extremities: No pedal edema. Skin: No rashes, lesions or ulcers Psychiatry: Judgement and insight appear normal. Mood & affect appropriate.    Data Reviewed:    Labs: Basic Metabolic Panel: Recent Labs  Lab 09/30/23 1420  NA 138  K 3.3*  CL 102  CO2 25   GLUCOSE 140*  BUN 8  CREATININE 0.82  CALCIUM 9.3   GFR Estimated Creatinine Clearance: 69.9 mL/min (by C-G formula based on SCr of 0.82 mg/dL). Liver Function Tests: Recent Labs  Lab 09/30/23 1420  AST 14*  ALT 15  ALKPHOS 83  BILITOT 0.8  PROT 7.8  ALBUMIN 3.0*   Recent Labs  Lab 09/30/23 1420  LIPASE 25   No results for input(s): AMMONIA in the last 168 hours. Coagulation profile No results for input(s): INR, PROTIME in the last 168 hours. COVID-19 Labs  Recent Labs    09/30/23 1422  DDIMER 1.46*    Lab Results  Component Value Date   SARSCOV2NAA NEGATIVE 09/25/2020   SARSCOV2NAA NEGATIVE 03/10/2020   SARSCOV2NAA NEGATIVE 06/23/2019   SARSCOV2NAA NOT DETECTED 03/03/2019    CBC: Recent Labs  Lab 09/30/23 1420  WBC 9.6  NEUTROABS 7.2  HGB 10.9*  HCT 34.4*  MCV 84.9  PLT 435*   Cardiac Enzymes: No results for input(s): CKTOTAL, CKMB, CKMBINDEX, TROPONINI in the last 168 hours. BNP (last 3 results) No results for input(s): PROBNP in the last 8760 hours. CBG: No results for input(s): GLUCAP in the last 168 hours. D-Dimer: Recent Labs    09/30/23 1422  DDIMER 1.46*   Hgb A1c: No results for input(s): HGBA1C in the last 72 hours. Lipid Profile: No results for input(s): CHOL, HDL, LDLCALC, TRIG, CHOLHDL, LDLDIRECT in the last 72 hours. Thyroid function  studies: No results for input(s): TSH, T4TOTAL, T3FREE, THYROIDAB in the last 72 hours.  Invalid input(s): FREET3 Anemia work up: No results for input(s): VITAMINB12, FOLATE, FERRITIN, TIBC, IRON , RETICCTPCT in the last 72 hours. Sepsis Labs: Recent Labs  Lab 09/30/23 1420  WBC 9.6   Microbiology No results found for this or any previous visit (from the past 240 hours).   Medications:    nicotine   14 mg Transdermal Daily   sodium chloride  flush  3 mL Intravenous Q12H   sodium chloride  flush  3 mL Intravenous Q12H   Continuous  Infusions:  sodium chloride      lactated ringers  100 mL/hr at 10/01/23 0020   piperacillin -tazobactam (ZOSYN )  IV 3.375 g (10/01/23 0354)      LOS: 1 day   Nicole Browning  [facility]  10/01/2023, 5:52 AM

## 2023-10-01 NOTE — Consult Note (Signed)
 Consultation  Referring Provider:    Dr. Odell Castor Primary Care Physician:  [name], NP Primary Gastroenterologist: Dr. Stacia        Reason for Consultation: Diverticulitis versus IBD         HPI:   Nicole Browning is a 53 y.o. female with a past medical history as listed below including breast cancer, depression, anxiety, diverticulitis, GERD, IBS, chronic smoking and ulcerative colitis, who presented to the ED with complaint of chest pain and abdominal pain for 4 days.    Today, the patient tells me that she has had a hard year.  Apparently recently she had been diagnosed with breast cancer, then her daughter passed away, then she lost her home in car in January and has been homeless since then.  She has had random attacks of chest pain which she relates to stress and happened to be on the phone with her PCP for a telemedicine visit yesterday when one of these hit her, she was crying and anxious and felt like she could not breathe and was told to come to the ER.  Tells me these episodes seem to come and go, even this morning when she was thinking about things she felt some chest pain.    Also describes a recent visit to the urgent care as her face had broken out, she was given some antibiotics but tells me that it felt like it just made it worse.    GI wise the patient describes for the past 2 weeks she has had some right sided abdominal pain off and on.  This feels more like a cramp rated as a 5-6/10 at its worst.  Describes 1 episode of vomiting a week ago which she relates to a certain food that she ate at that point.  Describes vomiting overnight but only after being given a pain med that made her feel immediately hot and flushed and she vomited 1 time.  Otherwise no nausea or vomiting.  Describes formed stool with no blood unless she has a very large stool and has to strain and then occasionally sees a little bit of bright red blood.  Does describe some chills which she  experiences in the middle of the night which have been happening off and on for a while.  Describes a good appetite with no heartburn or reflux.    Does endorse a fair amount of feeling fatigued.  She thinks this is related to all the stress she has been through recently.    Denies fever.  ER course: CBC unremarkable, minimal elevated platelet count 435, CMP with a low potassium at 3.4, normal lipase; CTAP with moderate wall thickening of the sigmoid colon with adjacent mesocolonic inflammation worrisome for changes from recurrent diverticulitis, thick-walled, peripherally enhancing fluid collection along the posterior sigmoid mesocolon, tubular gas-filled tract from the posterior wall of the sigmoid colon extending to the fluid collection suggesting a blind ending fistula or contained perforation, acute diverticulitis of the proximal ascending colon, increasing size of hypodense mass in the spleen, now measuring 3.1 cm (previously 2.3 cm), unchanged appearance of hypodense lesion in the right hepatic dome, couple of the additional hypodense abscesses in the right hepatic lobe have decreased in size  GI history: 03/11/2023 colonoscopy with Dr. Stacia: Poor preparation of the colon, perianal scar found on perianal exam consistent with previous history of perianal abscess/drainage, stool in the rectum sigmoid colon descending colon splenic flexure, nonbleeding internal hemorrhoids, sigmoid mucosa characterized by  scattered areas of erythema as well as some larger polypoid erythematous lesions, diverticulosis, possible diverticulitis, endoscopic findings not suggestive of Crohn's but visualization quite limited; pathology showed fragment of colonic mucosa with focal evidence of erosion negative for definitive evidence of chronicity, granulomas or viral cytopathic effect or dysplasia-at that point recommendations were for repeat colonoscopy at next available appointment given poor prep 09/29/2020 EGD with  minimal hiatal hernia and erosive gastritis 07/11/2020 patient seen in office by Elida Berber Smith-at that time we discussed she should start Lialda  1.2 g 2 tabs p.o. once daily 07/10/2020 patient had drainage of her large left perianal abscess and prescribed Augmentin  875 twice daily by surgical team 06/2020 colonoscopy with perianal abscess, one 6 mm polyp in the ascending colon, diffuse mild inflammation in the entire examined colon secondary to pancolitis, ileitis; path showed chronic moderately active colitis and multiple inflammatory polyps which were consistent with IBD at that time recommended CRP, fecal calprotectin and Lialda  2.4 g daily  Past Medical History:  Diagnosis Date   AKI (acute kidney injury) (HCC) 10/03/2020   Anemia    Anxiety    Cancer (HCC)    Breast   Depression    Diverticulitis 12/13/2020   GERD (gastroesophageal reflux disease)    History of radiation therapy    Right breast-10/08/22-11/28/22- Dr. Lynwood Nasuti   IBS (irritable bowel syndrome)    Perirectal abscess    Protein-calorie malnutrition (HCC) 02/29/2016   Tobacco use    UC (ulcerative colitis) (HCC)     Past Surgical History:  Procedure Laterality Date   BIOPSY  09/29/2020   Procedure: BIOPSY;  Surgeon: Charlanne Groom, MD;  Location: [facility] ENDOSCOPY;  Service: Endoscopy;;   BREAST BIOPSY Right 06/24/2022   US  RT BREAST BX W LOC DEV 1ST LESION IMG BX SPEC US  GUIDE 06/24/2022 [facility]   BREAST BIOPSY Right 06/24/2022   US  RT BREAST BX W LOC DEV EA ADD LESION IMG BX SPEC US  GUIDE 06/24/2022 [facility]   BREAST BIOPSY  08/13/2022   MM RT RADIOACTIVE SEED LOC MAMMO GUIDE 08/13/2022 [facility]   BREAST LUMPECTOMY WITH RADIOACTIVE SEED AND SENTINEL LYMPH NODE BIOPSY Right 08/14/2022   Procedure: RIGHT BREAST LUMPECTOMY WITH RADIOACTIVE SEED AND SENTINEL LYMPH NODE BIOPSY;  Surgeon: Aron Shoulders, MD;  Location: [facility];  Service: General;  Laterality: Right;    ESOPHAGOGASTRODUODENOSCOPY (EGD) WITH PROPOFOL  N/A 09/29/2020   Procedure: ESOPHAGOGASTRODUODENOSCOPY (EGD) WITH PROPOFOL ;  Surgeon: Charlanne Groom, MD;  Location: [facility] ENDOSCOPY;  Service: Endoscopy;  Laterality: N/A;   IR RADIOLOGIST EVAL & MGMT  10/17/2020   IRRIGATION AND DEBRIDEMENT ABSCESS N/A 02/24/2016   Procedure: IRRIGATION AND DEBRIDEMENT ABSCESS;  Surgeon: Shoulders Aron, MD;  Location: [facility] OR;  Service: General;  Laterality: N/A;    Family History  Problem Relation Age of Onset   Diabetes Mother    COPD Father    Leukemia Brother    Colon cancer Neg Hx    Colon polyps Neg Hx    Esophageal cancer Neg Hx    Rectal cancer Neg Hx    Stomach cancer Neg Hx     Social History   Tobacco Use   Smoking status: Former    Current packs/day: 0.33    Average packs/day: 0.3 packs/day for 28.0 years (9.2 ttl pk-yrs)    Types: Cigarettes   Smokeless tobacco: Never  Vaping Use   Vaping status: Never Used  Substance Use Topics   Alcohol use: Yes    Alcohol/week: 2.0  standard drinks of alcohol    Types: 2 Glasses of wine per week   Drug use: No    Prior to Admission medications   Medication Sig Start Date End Date Taking? Authorizing Provider  bismuth  subsalicylate (PEPTO BISMOL) 262 MG/15ML suspension Take 30 mLs by mouth every 6 (six) hours as needed for indigestion.   Yes [provider]  gabapentin  (NEURONTIN ) 100 MG capsule Take 1 capsule (100 mg total) by mouth 3 (three) times daily. Patient taking differently: Take 100 mg by mouth 3 (three) times daily as needed (neuropathy). 10/29/22  Yes Shannon Agent, MD  ketoconazole  (NIZORAL ) 2 % cream Apply 1 Application topically daily. 09/16/23  Yes Raspet, Erin K, PA-C  Multiple Vitamins-Minerals (WOMENS MULTIVITAMIN PO) Take 1 tablet by mouth daily.   Yes [provider]  POTASSIUM PO Take 1 tablet by mouth daily. Patient not taking: Reported on 09/30/2023    [provider]  pregabalin (LYRICA) 50 MG capsule  Take 50 mg by mouth daily. Patient not taking: Reported on 09/30/2023 05/28/23 05/27/24  [provider]    Current Facility-Administered Medications  Medication Dose Route Frequency Provider Last Rate Last Admin   0.9 %  sodium chloride  infusion  250 mL Intravenous PRN Sundil, Subrina, MD       acetaminophen  (TYLENOL ) tablet 650 mg  650 mg Oral Q6H PRN Sundil, Subrina, MD       HYDROmorphone  (DILAUDID ) injection 0.5-1 mg  0.5-1 mg Intravenous Q2H PRN Sundil, Subrina, MD   1 mg at 10/01/23 0930   lactated ringers  infusion   Intravenous Continuous Sundil, Subrina, MD 100 mL/hr at 10/01/23 0020 Infusion Verify at 10/01/23 0020   nicotine  (NICODERM CQ  - dosed in mg/24 hours) patch 14 mg  14 mg Transdermal Daily Sundil, Subrina, MD   14 mg at 10/01/23 0845   ondansetron  (ZOFRAN ) tablet 4 mg  4 mg Oral Q6H PRN Sundil, Subrina, MD       Or   ondansetron  (ZOFRAN ) injection 4 mg  4 mg Intravenous Q6H PRN Sundil, Subrina, MD   4 mg at 10/01/23 0047   piperacillin -tazobactam (ZOSYN ) IVPB 3.375 g  3.375 g Intravenous Q8H Hershal Vito MATSU, RPH 12.5 mL/hr at 10/01/23 0847 3.375 g at 10/01/23 0847   sodium chloride  flush (NS) 0.9 % injection 3 mL  3 mL Intravenous Q12H Sundil, Subrina, MD   3 mL at 10/01/23 9066   sodium chloride  flush (NS) 0.9 % injection 3 mL  3 mL Intravenous Q12H Sundil, Subrina, MD   3 mL at 10/01/23 9066   sodium chloride  flush (NS) 0.9 % injection 3 mL  3 mL Intravenous PRN Sundil, Subrina, MD        Allergies as of 09/30/2023 - Review Complete 09/30/2023  Allergen Reaction Noted   Bactrim  [sulfamethoxazole -trimethoprim ] Hives 01/12/2018   Percocet [oxycodone-acetaminophen ] Nausea Only 12/20/2014   Valium [diazepam] Nausea Only 12/20/2014   Review of Systems:    Constitutional: No weight loss, fever  Skin: +facial rash Cardiovascular: +chest pressure and discomfort at times related to stress Respiratory: No SOB Gastrointestinal: See HPI and otherwise  negative Genitourinary: No dysuria  Neurological: No headache, dizziness or syncope Musculoskeletal: No new muscle or joint pain Hematologic: No bruising Psychiatric: +anxiety   Physical Exam:  Vital signs in last 24 hours: Temp:  [97.7 F (36.5 C)-98.4 F (36.9 C)] 97.7 F (36.5 C) (07/31 0811) Pulse Rate:  [76-110] 90 (07/31 0811) Resp:  [11-28] 16 (07/31 0811) BP: (97-130)/(63-84) 104/77 (07/31 0811) SpO2:  [  99 %-100 %] 100 % (07/31 0811) Weight:  [55.8 kg] 55.8 kg (07/30 1921)   General:   Pleasant AA female appears to be in NAD, Well developed, Well nourished, alert and cooperative Head:  Normocephalic and atraumatic. Eyes:   PEERL, EOMI. No icterus. Conjunctiva pink. Ears:  Normal auditory acuity. Neck:  Supple Throat: Oral cavity and pharynx without inflammation, swelling or lesion.  Lungs: Respirations even and unlabored. Lungs clear to auscultation bilaterally.   No wheezes, crackles, or rhonchi.  Heart: Normal S1, S2. No MRG. Regular rate and rhythm. No peripheral edema, cyanosis or pallor.  Abdomen:  Soft, nondistended, mild right sided ttp, more in the RUQ. No rebound or guarding. Increased BS all four quadrants. No appreciable masses or hepatomegaly. Rectal:  Not performed.  Msk:  Symmetrical without gross deformities. Peripheral pulses intact.  Extremities:  Without edema, no deformity or joint abnormality.  Neurologic:  Alert and  oriented x4;  grossly normal neurologically.  Skin:   Dry and intact without significant lesions or rashes. Psychiatric: Demonstrates good judgement and reason without abnormal affect or behaviors.  LAB RESULTS:    Latest Ref Rng & Units 10/01/2023    6:19 AM 09/30/2023    2:20 PM 09/16/2023    5:17 PM  CBC  WBC 4.0 - 10.5 K/uL 15.8  9.6  8.4   Hemoglobin 12.0 - 15.0 g/dL 89.8  89.0  88.6   Hematocrit 36.0 - 46.0 % 31.8  34.4  34.8   Platelets 150 - 400 K/uL 435  435  485     BMET Recent Labs    09/30/23 1420 10/01/23 0619  NA  138 136  K 3.3* 3.9  CL 102 100  CO2 25 26  GLUCOSE 140* 94  BUN 8 6  CREATININE 0.82 0.77  CALCIUM 9.3 9.1   LFT Recent Labs    09/30/23 1420  PROT 7.8  ALBUMIN 3.0*  AST 14*  ALT 15  ALKPHOS 83  BILITOT 0.8   STUDIES: CT ABDOMEN PELVIS W CONTRAST Result Date: 09/30/2023 CLINICAL DATA:  Abdominal pain, acute, nonlocalized EXAM: CT ABDOMEN AND PELVIS WITH CONTRAST TECHNIQUE: Multidetector CT imaging of the abdomen and pelvis was performed using the standard protocol following bolus administration of intravenous contrast. RADIATION DOSE REDUCTION: This exam was performed according to the departmental dose-optimization program which includes automated exposure control, adjustment of the mA and/or kV according to patient size and/or use of iterative reconstruction technique. CONTRAST:  75mL OMNIPAQUE  IOHEXOL  350 MG/ML SOLN COMPARISON:  November 17, 2022 FINDINGS: Lower chest: For findings above the diaphragm, please see the separately dictated CT of the chest report, which was performed concurrently. Hepatobiliary: 1.6 x 2.1 cm hypodense lesion in the right hepatic dome, overall unchanged, previously measuring 2 x 1.5 cm.The small hypodense lesion in the right hepatic lobe (axial 10), is decreased in size in the interim, measuring 0.8 cm (previously 1.2 cm). Similar, wedge-shaped hypoenhancement in the posterior right hepatic lobe (segment 7). No radiopaque stones or wall thickening of the gallbladder. No intrahepatic or extrahepatic biliary ductal dilation. The portal veins are patent. Pancreas: No mass or main ductal dilation. No peripancreatic inflammation or fluid collection. Spleen: A couple of hypodense masses again noted in the spleen, the largest measures 3.1 cm (previously 2.3 cm). Adrenals/Urinary Tract: No adrenal masses. Similar appearance of a couple of small cysts in the left kidney. No nephrolithiasis or hydronephrosis. Circumferential wall thickening of the urinary bladder.  Stomach/Bowel: The stomach is decompressed without focal abnormality.  No small bowel wall thickening or inflammation. No small bowel obstruction.Normal appendix. Extensive, total colonic diverticulosis. Subtle inflammatory stranding about a couple of diverticula in the proximal ascending colon (for example, axial 47). Moderate wall thickening of the sigmoid colon with adjacent mesenteric inflammation. There is a thick-walled fluid collection in the posterior sigmoid mesocolon (axial 64), measuring 4.4 x 2.9 x 2.8 cm, with a gas-filled tract extending from the anterior aspect to the wall of the sigmoid colon (axial 60-62). Vascular/Lymphatic: No aortic aneurysm. Scattered aortoiliac atherosclerosis. No intraabdominal or pelvic lymphadenopathy. Reproductive: Age-related atrophy of the uterus and ovaries. No concerning adnexal mass.No free pelvic fluid. Other: No pneumoperitoneum or ascites. Musculoskeletal: No acute fracture or destructive lesion. IMPRESSION: 1. Moderate wall thickening of the sigmoid colon with adjacent mesocolonic inflammation, worrisome for changes from recurrent diverticulitis. Thick-walled, peripherally enhancing fluid collection along the posterior sigmoid mesocolon (axial 64), measuring 4.4 x 2.9 x 2.8 cm. There is a tubular gas-filled tract from the posterior wall of the sigmoid colon extending to this fluid collection, suggesting that this either is a blind-ending fistula or contained perforation. 2. Acute diverticulitis of the proximal ascending colon. No peridiverticular abscess or pneumoperitoneum. 3. Increasing size of the hypodense masses in the spleen, the largest now measures 3.1 cm (previously, 2.3 cm). 4. Unchanged appearance of the hypodense lesion in the right hepatic dome, measuring 1.6 x 2.1 cm (previously, 1.5 x 2 cm). A couple of the additional hypodense abscesses in the right hepatic lobe have decreased in size. 5. For findings above the diaphragm, please see the separately  dictated CT of the chest report, which was performed concurrently. Aortic Atherosclerosis (ICD10-I70.0). Electronically Signed   By: Rogelia Myers M.D.   On: 09/30/2023 18:43   CT Angio Chest PE W/Cm &/Or Wo Cm Result Date: 09/30/2023 CLINICAL DATA:  Pulmonary embolism (PE) suspected, low to intermediate prob, positive D-dimer EXAM: CT ANGIOGRAPHY CHEST WITH CONTRAST TECHNIQUE: Multidetector CT imaging of the chest was performed using the standard protocol during bolus administration of intravenous contrast. Multiplanar CT image reconstructions and MIPs were obtained to evaluate the vascular anatomy. RADIATION DOSE REDUCTION: This exam was performed according to the departmental dose-optimization program which includes automated exposure control, adjustment of the mA and/or kV according to patient size and/or use of iterative reconstruction technique. CONTRAST:  75mL OMNIPAQUE  IOHEXOL  350 MG/ML SOLN COMPARISON:  September 27, 2020 FINDINGS: Pulmonary Embolism: No pulmonary embolism. Cardiovascular: No cardiomegaly or pericardial effusion.No aortic aneurysm. Mediastinum/Nodes: No mediastinal mass. No mediastinal, hilar, or axillary lymphadenopathy. Right axillary lymph node dissection. Lungs/Pleura: The midline trachea and bronchi are patent. Posterior bibasilar dependent atelectasis. No focal airspace consolidation, pleural effusion, or pneumothorax. 3 mm nodule in the anterior basal left lower lobe (axial 67). Musculoskeletal: No acute fracture or destructive bone lesion. Upper Abdomen: For findings below the diaphragm, please see the separately dictated CT of the abdomen and pelvis report, which was performed concurrently. Review of the MIP images confirms the above findings. IMPRESSION: 1. No acute intrathoracic abnormality; specifically, no pulmonary embolism, pneumonia, or pleural effusion. 2. In the antero basal left lower lobe, there is a 3 mm nodule (axial 67). While this could represent a region of  fibrolinear scarring, continued follow-up per routine oncologic protocols is recommended. Electronically Signed   By: Rogelia Myers M.D.   On: 09/30/2023 18:29   DG Chest 2 View Result Date: 09/30/2023 CLINICAL DATA:  Chest pain and shortness of breath that began 3 days ago EXAM: CHEST - 2 VIEW COMPARISON:  X-ray 11/18/2022 FINDINGS: No consolidation, pneumothorax or effusion. No edema. Normal cardiopericardial silhouette. Surgical clips along the right axillary region. IMPRESSION: No acute cardiopulmonary disease. Electronically Signed   By: Ranell Bring M.D.   On: 09/30/2023 15:05    Impression / Plan:   Impression: 1.  Acute diverticulitis/colitis: with perforation/blind-ending fistula, colonoscopy in January with poor prep and repeat was recommended next available appointment but patient never followed up, biopsies at that time were negative for IBD, though previously indicated IBD back in 2022, patient had been recommended to start Lialda  but she has not been on this medication, also with hepatic abscess and splenic lesions which could be seeding from chronic/acute diverticulitis with perforation; consider acute diverticulitis +/- IBD 2.  Hepatic lesion/abscess: It appears these been present since 11/17/2022 based on previous imaging and radiology, previously discussed as too small for IR drainage and have remained about the same size; potential seeding from diverticulitis 3.  Splenic lesion: With above 4.  Hypokalemia: Corrected now 5.  GAD 6.  Chronic smoking 7.  Homeless  Plan: 1.  Discussed case with Dr. Stacia.  She had a very similar presentation during last hospitalization and all of this was discussed then.  She could have underlying IBD but will need to do a double prep to be able to visualize and get adequate biopsies.  Would rather not pursue this now with her acute diverticulitis and perforation, but rather wait for this to resolve before proceeding, at least 4-6 weeks.   Antibiotics per surgical team/ID. 2.  Hepatic/splenic abscess/lesion were too small for drainage during last hospitalization and remains about the same size.  Consider seeding from diverticulitis/abscess. 3.  Will need to see the patient outpatient to reassess for IBD, went ahead and ordered serologies for Crohn's disease including pANCA and ASCA to help decipher her underlying disease process. 4.  Continue other supportive measures and care per surgical team  Thank you for your kind consultation.  Delon Gibson [facility]  10/01/2023, 11:58 AM

## 2023-10-01 NOTE — Telephone Encounter (Signed)
 Late entry called patient she was in the ED states something is wrong with her. Explained did not call her back because I went to [employer] and spoke with manager about your urns awaiting an appt did not want to tell you and you were disappointed . Never received call from [employer]

## 2023-10-01 NOTE — Progress Notes (Signed)
 Progress Note     Subjective: Patient denies worsening abdominal pain with eating. She did report some nausea and vomiting overnight but feels like it was related to medication not to eating. She has been having 2-3 BM per day and denies any blood or mucus present. Reportedly she was also seen at [facility] last month and given antibiotics but for facial skin outbreak which she reports has been worse over the last month.   Objective: Vital signs in last 24 hours: Temp:  [97.7 F (36.5 C)-98.4 F (36.9 C)] 97.7 F (36.5 C) (07/31 0811) Pulse Rate:  [76-110] 90 (07/31 0811) Resp:  [11-28] 16 (07/31 0811) BP: (97-130)/(63-84) 104/77 (07/31 0811) SpO2:  [99 %-100 %] 100 % (07/31 0811) Weight:  [55.8 kg] 55.8 kg (07/30 1921)    Intake/Output from previous day: 07/30 0701 - 07/31 0700 In: 1597.6 [I.V.:397.6; IV Piggyback:1200] Out: -  Intake/Output this shift: Total I/O In: 480 [P.O.:480] Out: -   PE: General: pleasant, WD, WN female who is laying in bed in NAD HEENT: facial folliculitis Heart: regular, rate, and rhythm.   Lungs:  Respiratory effort nonlabored Abd: soft, NT, mild to moderate distention, +BS, no masses, hernias, or organomegaly Psych: A&Ox3 with an appropriate affect.    Lab Results:  Recent Labs    09/30/23 1420 10/01/23 0619  WBC 9.6 15.8*  HGB 10.9* 10.1*  HCT 34.4* 31.8*  PLT 435* 435*   BMET Recent Labs    09/30/23 1420 10/01/23 0619  NA 138 136  K 3.3* 3.9  CL 102 100  CO2 25 26  GLUCOSE 140* 94  BUN 8 6  CREATININE 0.82 0.77  CALCIUM 9.3 9.1   PT/INR No results for input(s): LABPROT, INR in the last 72 hours. CMP     Component Value Date/Time   NA 136 10/01/2023 0619   K 3.9 10/01/2023 0619   CL 100 10/01/2023 0619   CO2 26 10/01/2023 0619   GLUCOSE 94 10/01/2023 0619   BUN 6 10/01/2023 0619   CREATININE 0.77 10/01/2023 0619   CREATININE 0.64 10/25/2020 1534   CALCIUM 9.1 10/01/2023 0619   PROT 7.8 09/30/2023 1420    ALBUMIN 3.0 (L) 09/30/2023 1420   AST 14 (L) 09/30/2023 1420   ALT 15 09/30/2023 1420   ALKPHOS 83 09/30/2023 1420   BILITOT 0.8 09/30/2023 1420   GFRNONAA >60 10/01/2023 0619   GFRAA >60 02/29/2016 0426   Lipase     Component Value Date/Time   LIPASE 25 09/30/2023 1420       Studies/Results: CT ABDOMEN PELVIS W CONTRAST Result Date: 09/30/2023 CLINICAL DATA:  Abdominal pain, acute, nonlocalized EXAM: CT ABDOMEN AND PELVIS WITH CONTRAST TECHNIQUE: Multidetector CT imaging of the abdomen and pelvis was performed using the standard protocol following bolus administration of intravenous contrast. RADIATION DOSE REDUCTION: This exam was performed according to the departmental dose-optimization program which includes automated exposure control, adjustment of the mA and/or kV according to patient size and/or use of iterative reconstruction technique. CONTRAST:  75mL OMNIPAQUE  IOHEXOL  350 MG/ML SOLN COMPARISON:  November 17, 2022 FINDINGS: Lower chest: For findings above the diaphragm, please see the separately dictated CT of the chest report, which was performed concurrently. Hepatobiliary: 1.6 x 2.1 cm hypodense lesion in the right hepatic dome, overall unchanged, previously measuring 2 x 1.5 cm.The small hypodense lesion in the right hepatic lobe (axial 10), is decreased in size in the interim, measuring 0.8 cm (previously 1.2 cm). Similar, wedge-shaped hypoenhancement in the posterior  right hepatic lobe (segment 7). No radiopaque stones or wall thickening of the gallbladder. No intrahepatic or extrahepatic biliary ductal dilation. The portal veins are patent. Pancreas: No mass or main ductal dilation. No peripancreatic inflammation or fluid collection. Spleen: A couple of hypodense masses again noted in the spleen, the largest measures 3.1 cm (previously 2.3 cm). Adrenals/Urinary Tract: No adrenal masses. Similar appearance of a couple of small cysts in the left kidney. No nephrolithiasis or  hydronephrosis. Circumferential wall thickening of the urinary bladder. Stomach/Bowel: The stomach is decompressed without focal abnormality. No small bowel wall thickening or inflammation. No small bowel obstruction.Normal appendix. Extensive, total colonic diverticulosis. Subtle inflammatory stranding about a couple of diverticula in the proximal ascending colon (for example, axial 47). Moderate wall thickening of the sigmoid colon with adjacent mesenteric inflammation. There is a thick-walled fluid collection in the posterior sigmoid mesocolon (axial 64), measuring 4.4 x 2.9 x 2.8 cm, with a gas-filled tract extending from the anterior aspect to the wall of the sigmoid colon (axial 60-62). Vascular/Lymphatic: No aortic aneurysm. Scattered aortoiliac atherosclerosis. No intraabdominal or pelvic lymphadenopathy. Reproductive: Age-related atrophy of the uterus and ovaries. No concerning adnexal mass.No free pelvic fluid. Other: No pneumoperitoneum or ascites. Musculoskeletal: No acute fracture or destructive lesion. IMPRESSION: 1. Moderate wall thickening of the sigmoid colon with adjacent mesocolonic inflammation, worrisome for changes from recurrent diverticulitis. Thick-walled, peripherally enhancing fluid collection along the posterior sigmoid mesocolon (axial 64), measuring 4.4 x 2.9 x 2.8 cm. There is a tubular gas-filled tract from the posterior wall of the sigmoid colon extending to this fluid collection, suggesting that this either is a blind-ending fistula or contained perforation. 2. Acute diverticulitis of the proximal ascending colon. No peridiverticular abscess or pneumoperitoneum. 3. Increasing size of the hypodense masses in the spleen, the largest now measures 3.1 cm (previously, 2.3 cm). 4. Unchanged appearance of the hypodense lesion in the right hepatic dome, measuring 1.6 x 2.1 cm (previously, 1.5 x 2 cm). A couple of the additional hypodense abscesses in the right hepatic lobe have decreased in  size. 5. For findings above the diaphragm, please see the separately dictated CT of the chest report, which was performed concurrently. Aortic Atherosclerosis (ICD10-I70.0). Electronically Signed   By: Rogelia Myers M.D.   On: 09/30/2023 18:43   CT Angio Chest PE W/Cm &/Or Wo Cm Result Date: 09/30/2023 CLINICAL DATA:  Pulmonary embolism (PE) suspected, low to intermediate prob, positive D-dimer EXAM: CT ANGIOGRAPHY CHEST WITH CONTRAST TECHNIQUE: Multidetector CT imaging of the chest was performed using the standard protocol during bolus administration of intravenous contrast. Multiplanar CT image reconstructions and MIPs were obtained to evaluate the vascular anatomy. RADIATION DOSE REDUCTION: This exam was performed according to the departmental dose-optimization program which includes automated exposure control, adjustment of the mA and/or kV according to patient size and/or use of iterative reconstruction technique. CONTRAST:  75mL OMNIPAQUE  IOHEXOL  350 MG/ML SOLN COMPARISON:  September 27, 2020 FINDINGS: Pulmonary Embolism: No pulmonary embolism. Cardiovascular: No cardiomegaly or pericardial effusion.No aortic aneurysm. Mediastinum/Nodes: No mediastinal mass. No mediastinal, hilar, or axillary lymphadenopathy. Right axillary lymph node dissection. Lungs/Pleura: The midline trachea and bronchi are patent. Posterior bibasilar dependent atelectasis. No focal airspace consolidation, pleural effusion, or pneumothorax. 3 mm nodule in the anterior basal left lower lobe (axial 67). Musculoskeletal: No acute fracture or destructive bone lesion. Upper Abdomen: For findings below the diaphragm, please see the separately dictated CT of the abdomen and pelvis report, which was performed concurrently. Review of the MIP images  confirms the above findings. IMPRESSION: 1. No acute intrathoracic abnormality; specifically, no pulmonary embolism, pneumonia, or pleural effusion. 2. In the antero basal left lower lobe, there is a 3  mm nodule (axial 67). While this could represent a region of fibrolinear scarring, continued follow-up per routine oncologic protocols is recommended. Electronically Signed   By: Rogelia Myers M.D.   On: 09/30/2023 18:29   DG Chest 2 View Result Date: 09/30/2023 CLINICAL DATA:  Chest pain and shortness of breath that began 3 days ago EXAM: CHEST - 2 VIEW COMPARISON:  X-ray 11/18/2022 FINDINGS: No consolidation, pneumothorax or effusion. No edema. Normal cardiopericardial silhouette. Surgical clips along the right axillary region. IMPRESSION: No acute cardiopulmonary disease. Electronically Signed   By: Ranell Bring M.D.   On: 09/30/2023 15:05    Anti-infectives: Anti-infectives (From admission, onward)    Start     Dose/Rate Route Frequency Ordered Stop   10/01/23 0000  piperacillin -tazobactam (ZOSYN ) IVPB 3.375 g        3.375 g 12.5 mL/hr over 240 Minutes Intravenous Every 8 hours 09/30/23 2030     09/30/23 1915  piperacillin -tazobactam (ZOSYN ) IVPB 3.375 g        3.375 g 100 mL/hr over 30 Minutes Intravenous  Once 09/30/23 1903 09/30/23 1942        Assessment/Plan Diverticulitis vs IBD flare - CT 7/30 with moderate thickening of sigmoid colon with adjacent inflammation and possible abscess along posterior mesocolon measuring 4.4 x 2.9 x 2.8 cm, question fistula between collection and colon vs contained perforation. Acute diverticulitis of proximal ascending colon. Hypodense splenic masses increased in size from prior imaging. Hypodense lesion in right hepatic dome decreased in size.  - WBC up to 15.8 from 9.6, afebrile and HD stable - some vomiting overnight but no pain with eating and having bowel function  - no indication for emergent surgical intervention based on exam today  - agree with GI consult to weigh in given concern for possible IBD and concern for possible terminal ileitis on colorectal surgery review of CT from yesterday - if continued nausea and vomiting would recommend  decreasing to CLD or FLD but if no further issues and no worsening pain with PO intake then ok to have diet as tolerated from surgery standpoint   FEN: HH diet, IVF per [name] VTE: ok to have SQH or LMWH from surgical standpoint ID: Zosyn  7/30>>  - per [name] -  GAD/Depression Hx of breast cancer Hx of perirectal abscess GERD Tobacco abuse   LOS: 1 day   I reviewed hospitalist notes, last 24 h vitals and pain scores, last 48 h intake and output, last 24 h labs and trends, and last 24 h imaging results.  This care required moderate level of medical decision making.    Burnard JONELLE Louder, [facility] 10/01/2023, 9:36 AM Please see Amion for pager number during day hours 7:00am-4:30pm

## 2023-10-02 DIAGNOSIS — K50814 Crohn's disease of both small and large intestine with abscess: Secondary | ICD-10-CM | POA: Diagnosis not present

## 2023-10-02 DIAGNOSIS — Z8719 Personal history of other diseases of the digestive system: Secondary | ICD-10-CM

## 2023-10-02 DIAGNOSIS — K5792 Diverticulitis of intestine, part unspecified, without perforation or abscess without bleeding: Secondary | ICD-10-CM | POA: Diagnosis not present

## 2023-10-02 LAB — CBC
HCT: 32.3 % — ABNORMAL LOW (ref 36.0–46.0)
Hemoglobin: 10.3 g/dL — ABNORMAL LOW (ref 12.0–15.0)
MCH: 27.3 pg (ref 26.0–34.0)
MCHC: 31.9 g/dL (ref 30.0–36.0)
MCV: 85.7 fL (ref 80.0–100.0)
Platelets: 435 K/uL — ABNORMAL HIGH (ref 150–400)
RBC: 3.77 MIL/uL — ABNORMAL LOW (ref 3.87–5.11)
RDW: 15.1 % (ref 11.5–15.5)
WBC: 7.9 K/uL (ref 4.0–10.5)
nRBC: 0 % (ref 0.0–0.2)

## 2023-10-02 LAB — ANCA TITERS
Atypical P-ANCA titer: <1:20 {titer}
C-ANCA: <1:20 {titer}
P-ANCA: <1:20 {titer}

## 2023-10-02 LAB — SACCHAROMYCES CEREVISIAE ANTIBODIES, IGG AND IGA
Saccharomyces cerevisiae, IgA: <20 U (ref 0.0–24.9)
Saccharomyces cerevisiae, IgG: <20 U (ref 0.0–24.9)

## 2023-10-02 LAB — C-REACTIVE PROTEIN: CRP: 3.8 mg/dL — ABNORMAL HIGH (ref <1.0)

## 2023-10-02 MED ORDER — TRAMADOL HCL 50 MG PO TABS
50.0000 mg | ORAL_TABLET | Freq: Four times a day (QID) | ORAL | Status: DC | PRN
  Administered 2023-10-02 – 2023-10-03 (×6): 50 mg via ORAL
  Filled 2023-10-02 (×6): qty 1

## 2023-10-02 NOTE — Progress Notes (Signed)
 [facility] ROUNDING NOTE   Subjective: Patient feeling ok this morning.  She had some abdominal discomfort yesterday evening and requested pain medication which she said made her feel nauseated and resulted in vomiting.  No further vomiting.  No significant pain since then.  She had a hard stool with bright red blood per rectum. No fevers/chills.  Leukocytosis much improved from yesterday (16->8)    Objective: Vital signs in last 24 hours: Temp:  [98.1 F (36.7 C)-98.4 F (36.9 C)] 98.4 F (36.9 C) (08/01 1518) Pulse Rate:  [73-97] 81 (08/01 1518) Resp:  [16-18] 16 (08/01 1518) BP: (106-123)/(76-84) 120/80 (08/01 1518) SpO2:  [100 %] 100 % (08/01 1518) Last BM Date : 10/02/23 General: NAD, thin African American female; daughter asleep in room Lungs:  CTA b/l, no w/r/r Heart:  RRR, no m/r/g Abdomen:  Soft, mild tenderness to palpation in the left hemiabdomen, no significant RUQ tenderness, no rigidity or guardin, ND, +BS Ext:  No c/c/e    Intake/Output from previous day: 07/31 0701 - 08/01 0700 In: 608.2 [P.O.:480; I.V.:28.2; IV Piggyback:100] Out: 100 [Emesis/NG output:100] Intake/Output this shift: Total I/O In: 360 [P.O.:360] Out: -    Lab Results: Recent Labs    09/30/23 1420 10/01/23 0619 10/02/23 0913  WBC 9.6 15.8* 7.9  HGB 10.9* 10.1* 10.3*  PLT 435* 435* 435*  MCV 84.9 86.4 85.7   BMET Recent Labs    09/30/23 1420 10/01/23 0619  NA 138 136  K 3.3* 3.9  CL 102 100  CO2 25 26  GLUCOSE 140* 94  BUN 8 6  CREATININE 0.82 0.77  CALCIUM 9.3 9.1   LFT Recent Labs    09/30/23 1420  PROT 7.8  ALBUMIN 3.0*  AST 14*  ALT 15  ALKPHOS 83  BILITOT 0.8   PT/INR No results for input(s): INR in the last 72 hours.    Imaging/Other results: CT ABDOMEN PELVIS W CONTRAST Result Date: 09/30/2023 CLINICAL DATA:  Abdominal pain, acute, nonlocalized EXAM: CT ABDOMEN AND PELVIS WITH CONTRAST TECHNIQUE: Multidetector CT imaging of the  abdomen and pelvis was performed using the standard protocol following bolus administration of intravenous contrast. RADIATION DOSE REDUCTION: This exam was performed according to the departmental dose-optimization program which includes automated exposure control, adjustment of the mA and/or kV according to patient size and/or use of iterative reconstruction technique. CONTRAST:  75mL OMNIPAQUE  IOHEXOL  350 MG/ML SOLN COMPARISON:  November 17, 2022 FINDINGS: Lower chest: For findings above the diaphragm, please see the separately dictated CT of the chest report, which was performed concurrently. Hepatobiliary: 1.6 x 2.1 cm hypodense lesion in the right hepatic dome, overall unchanged, previously measuring 2 x 1.5 cm.The small hypodense lesion in the right hepatic lobe (axial 10), is decreased in size in the interim, measuring 0.8 cm (previously 1.2 cm). Similar, wedge-shaped hypoenhancement in the posterior right hepatic lobe (segment 7). No radiopaque stones or wall thickening of the gallbladder. No intrahepatic or extrahepatic biliary ductal dilation. The portal veins are patent. Pancreas: No mass or main ductal dilation. No peripancreatic inflammation or fluid collection. Spleen: A couple of hypodense masses again noted in the spleen, the largest measures 3.1 cm (previously 2.3 cm). Adrenals/Urinary Tract: No adrenal masses. Similar appearance of a couple of small cysts in the left kidney. No nephrolithiasis or hydronephrosis. Circumferential wall thickening of the urinary bladder. Stomach/Bowel: The stomach is decompressed without focal abnormality. No small bowel wall thickening or inflammation. No small bowel obstruction.Normal appendix. Extensive, total colonic diverticulosis. Subtle inflammatory  stranding about a couple of diverticula in the proximal ascending colon (for example, axial 47). Moderate wall thickening of the sigmoid colon with adjacent mesenteric inflammation. There is a thick-walled fluid  collection in the posterior sigmoid mesocolon (axial 64), measuring 4.4 x 2.9 x 2.8 cm, with a gas-filled tract extending from the anterior aspect to the wall of the sigmoid colon (axial 60-62). Vascular/Lymphatic: No aortic aneurysm. Scattered aortoiliac atherosclerosis. No intraabdominal or pelvic lymphadenopathy. Reproductive: Age-related atrophy of the uterus and ovaries. No concerning adnexal mass.No free pelvic fluid. Other: No pneumoperitoneum or ascites. Musculoskeletal: No acute fracture or destructive lesion. IMPRESSION: 1. Moderate wall thickening of the sigmoid colon with adjacent mesocolonic inflammation, worrisome for changes from recurrent diverticulitis. Thick-walled, peripherally enhancing fluid collection along the posterior sigmoid mesocolon (axial 64), measuring 4.4 x 2.9 x 2.8 cm. There is a tubular gas-filled tract from the posterior wall of the sigmoid colon extending to this fluid collection, suggesting that this either is a blind-ending fistula or contained perforation. 2. Acute diverticulitis of the proximal ascending colon. No peridiverticular abscess or pneumoperitoneum. 3. Increasing size of the hypodense masses in the spleen, the largest now measures 3.1 cm (previously, 2.3 cm). 4. Unchanged appearance of the hypodense lesion in the right hepatic dome, measuring 1.6 x 2.1 cm (previously, 1.5 x 2 cm). A couple of the additional hypodense abscesses in the right hepatic lobe have decreased in size. 5. For findings above the diaphragm, please see the separately dictated CT of the chest report, which was performed concurrently. Aortic Atherosclerosis (ICD10-I70.0). Electronically Signed   By: Rogelia Myers M.D.   On: 09/30/2023 18:43   CT Angio Chest PE W/Cm &/Or Wo Cm Result Date: 09/30/2023 CLINICAL DATA:  Pulmonary embolism (PE) suspected, low to intermediate prob, positive D-dimer EXAM: CT ANGIOGRAPHY CHEST WITH CONTRAST TECHNIQUE: Multidetector CT imaging of the chest was performed  using the standard protocol during bolus administration of intravenous contrast. Multiplanar CT image reconstructions and MIPs were obtained to evaluate the vascular anatomy. RADIATION DOSE REDUCTION: This exam was performed according to the departmental dose-optimization program which includes automated exposure control, adjustment of the mA and/or kV according to patient size and/or use of iterative reconstruction technique. CONTRAST:  75mL OMNIPAQUE  IOHEXOL  350 MG/ML SOLN COMPARISON:  September 27, 2020 FINDINGS: Pulmonary Embolism: No pulmonary embolism. Cardiovascular: No cardiomegaly or pericardial effusion.No aortic aneurysm. Mediastinum/Nodes: No mediastinal mass. No mediastinal, hilar, or axillary lymphadenopathy. Right axillary lymph node dissection. Lungs/Pleura: The midline trachea and bronchi are patent. Posterior bibasilar dependent atelectasis. No focal airspace consolidation, pleural effusion, or pneumothorax. 3 mm nodule in the anterior basal left lower lobe (axial 67). Musculoskeletal: No acute fracture or destructive bone lesion. Upper Abdomen: For findings below the diaphragm, please see the separately dictated CT of the abdomen and pelvis report, which was performed concurrently. Review of the MIP images confirms the above findings. IMPRESSION: 1. No acute intrathoracic abnormality; specifically, no pulmonary embolism, pneumonia, or pleural effusion. 2. In the antero basal left lower lobe, there is a 3 mm nodule (axial 67). While this could represent a region of fibrolinear scarring, continued follow-up per routine oncologic protocols is recommended. Electronically Signed   By: Rogelia Myers M.D.   On: 09/30/2023 18:29      Assessment and Plan:   53 year old female with confusing history of colitis and diverticulitis, chronic hepatic fluid collections going back to 2022. She had a perirectal abscess in 2017 that was treated surgically In 2022 she had a large perianal abscess and  had  endoscopic findings suggestive of inflammatory bowel disease (diffuse, mild inflammatory changes throughout the colon and mild ileitis). In 2024 she was admitted with CT findings suggestive of complicated sigmoid diverticulitis with possible hepatic abscesses, treated with antibiotics. Follow up colonoscopy in Jan 2025 showed no evidence of colitis or Crohn's disease.  Diverticulosis and suspected diverticulitis (whitish exudates) noted.  Admitted again with vague nonsevere abdominal pain, no fevers/chills but again with CT findings suggestive of complicated sigmoid diverticulitis, uncomplicated ascending colon diverticulitis, persistent hepatic hypodensities and splenic lesions.  Diverticulitis - Continue IV antibiotics today; consider another 24-48 hrs then transition to PO.  Would consider another prolonged course of antibiotics (4 weeks) and then repeat CT scan  Inflammatory bowel disease No evidence of IBD in Jan 2025.  CT findings more consistent with complicated diverticulitis.  Patient denies chronic GI symptoms.  Unclear what role IBD is playing at this point - Tentatively plan to repeat colonoscopy as outpatient to reassess for active colitis/ileitis - F/u CRP, fecal calprotectin; will trend as outpatient - IBD serologies not consistent with IBD. - No role colonoscopy now given high risk of perforation - No role for initiation of biologics given intra-abdominal abscesses/phlegmon  Hepatic/splenic hypodensities - Unclear etiology.  Presumably infectious, related to diverticulitis  Glendia FORBES Holt, MD  10/02/2023, 5:11 PM [facility]

## 2023-10-02 NOTE — Progress Notes (Signed)
 [facility] PROGRESS NOTE    Progress Note  Nicole Browning  FMW:[id number] DOB: 11-07-1970 DOA: 09/30/2023 PCP: [name], NP     Brief Narrative:   Nicole Browning is an 53 y.o. female past medical history significant for breast cancer, diverticulitis, chronic smoking and  Crohn's disease comes into the ED complaining of abdominal pain that started 4 days prior to admission and radiates to her chest in the ED she was found to febrile with thrombocytosis CT angio of the chest showed no acute PE and CT scan of the abdomen showed sigmoid diverticulitis with an abscess measuring 4 x 3 x 3 with possible fistula or perforation, increase in size splenic mass   Assessment/Plan:   Crohn's disease flareup with possible abscess /complicated acute diverticulitis/ Hepatic lesion/splenic lesion: Continue empiric antibiotics. GI consulted recommended to continue IV empiric antibiotics no endoscopic evaluation given high risk of perforation. They recommended to continue empiric antibiotics for now, and she will need a repeat colonoscopy in 6 weeks to reassess for inflammatory bowel disease.  Hypokalemia Repleted now resolved  History of general anxiety disorder and depression: Currently on no medications will need to follow-up with PCP as an outpatient.  History of breast cancer Noted.  Tobacco abuse: Continue getting patch   DVT prophylaxis: lovenox  Family Communication:none Status is: Inpatient Remains inpatient appropriate because: Acute ulcerative colitis flare    Code Status:     Code Status Orders  (From admission, onward)           Start     Ordered   09/30/23 1932  Full code  Continuous       Question:  By:  Answer:  Consent: discussion documented in EHR   09/30/23 1931           Code Status History     Date Active Date Inactive Code Status Order ID Comments User Context   11/17/2022 1232 11/20/2022 2124 Full Code [id number]  Seena Marsa NOVAK,  MD ED   09/25/2020 2329 10/05/2020 2317 Full Code [id number]  Franky Redia SAILOR, MD ED   02/24/2016 1903 02/29/2016 1639 Full Code [id number]  Aron Shoulders, MD Inpatient         IV Access:   Peripheral IV   Procedures and diagnostic studies:   CT ABDOMEN PELVIS W CONTRAST Result Date: 09/30/2023 CLINICAL DATA:  Abdominal pain, acute, nonlocalized EXAM: CT ABDOMEN AND PELVIS WITH CONTRAST TECHNIQUE: Multidetector CT imaging of the abdomen and pelvis was performed using the standard protocol following bolus administration of intravenous contrast. RADIATION DOSE REDUCTION: This exam was performed according to the departmental dose-optimization program which includes automated exposure control, adjustment of the mA and/or kV according to patient size and/or use of iterative reconstruction technique. CONTRAST:  75mL OMNIPAQUE  IOHEXOL  350 MG/ML SOLN COMPARISON:  November 17, 2022 FINDINGS: Lower chest: For findings above the diaphragm, please see the separately dictated CT of the chest report, which was performed concurrently. Hepatobiliary: 1.6 x 2.1 cm hypodense lesion in the right hepatic dome, overall unchanged, previously measuring 2 x 1.5 cm.The small hypodense lesion in the right hepatic lobe (axial 10), is decreased in size in the interim, measuring 0.8 cm (previously 1.2 cm). Similar, wedge-shaped hypoenhancement in the posterior right hepatic lobe (segment 7). No radiopaque stones or wall thickening of the gallbladder. No intrahepatic or extrahepatic biliary ductal dilation. The portal veins are patent. Pancreas: No mass or main ductal dilation. No peripancreatic inflammation or fluid collection. Spleen: A couple of hypodense masses again noted  in the spleen, the largest measures 3.1 cm (previously 2.3 cm). Adrenals/Urinary Tract: No adrenal masses. Similar appearance of a couple of small cysts in the left kidney. No nephrolithiasis or hydronephrosis. Circumferential wall thickening of the  urinary bladder. Stomach/Bowel: The stomach is decompressed without focal abnormality. No small bowel wall thickening or inflammation. No small bowel obstruction.Normal appendix. Extensive, total colonic diverticulosis. Subtle inflammatory stranding about a couple of diverticula in the proximal ascending colon (for example, axial 47). Moderate wall thickening of the sigmoid colon with adjacent mesenteric inflammation. There is a thick-walled fluid collection in the posterior sigmoid mesocolon (axial 64), measuring 4.4 x 2.9 x 2.8 cm, with a gas-filled tract extending from the anterior aspect to the wall of the sigmoid colon (axial 60-62). Vascular/Lymphatic: No aortic aneurysm. Scattered aortoiliac atherosclerosis. No intraabdominal or pelvic lymphadenopathy. Reproductive: Age-related atrophy of the uterus and ovaries. No concerning adnexal mass.No free pelvic fluid. Other: No pneumoperitoneum or ascites. Musculoskeletal: No acute fracture or destructive lesion. IMPRESSION: 1. Moderate wall thickening of the sigmoid colon with adjacent mesocolonic inflammation, worrisome for changes from recurrent diverticulitis. Thick-walled, peripherally enhancing fluid collection along the posterior sigmoid mesocolon (axial 64), measuring 4.4 x 2.9 x 2.8 cm. There is a tubular gas-filled tract from the posterior wall of the sigmoid colon extending to this fluid collection, suggesting that this either is a blind-ending fistula or contained perforation. 2. Acute diverticulitis of the proximal ascending colon. No peridiverticular abscess or pneumoperitoneum. 3. Increasing size of the hypodense masses in the spleen, the largest now measures 3.1 cm (previously, 2.3 cm). 4. Unchanged appearance of the hypodense lesion in the right hepatic dome, measuring 1.6 x 2.1 cm (previously, 1.5 x 2 cm). A couple of the additional hypodense abscesses in the right hepatic lobe have decreased in size. 5. For findings above the diaphragm, please see  the separately dictated CT of the chest report, which was performed concurrently. Aortic Atherosclerosis (ICD10-I70.0). Electronically Signed   By: Rogelia Myers M.D.   On: 09/30/2023 18:43   CT Angio Chest PE W/Cm &/Or Wo Cm Result Date: 09/30/2023 CLINICAL DATA:  Pulmonary embolism (PE) suspected, low to intermediate prob, positive D-dimer EXAM: CT ANGIOGRAPHY CHEST WITH CONTRAST TECHNIQUE: Multidetector CT imaging of the chest was performed using the standard protocol during bolus administration of intravenous contrast. Multiplanar CT image reconstructions and MIPs were obtained to evaluate the vascular anatomy. RADIATION DOSE REDUCTION: This exam was performed according to the departmental dose-optimization program which includes automated exposure control, adjustment of the mA and/or kV according to patient size and/or use of iterative reconstruction technique. CONTRAST:  75mL OMNIPAQUE  IOHEXOL  350 MG/ML SOLN COMPARISON:  September 27, 2020 FINDINGS: Pulmonary Embolism: No pulmonary embolism. Cardiovascular: No cardiomegaly or pericardial effusion.No aortic aneurysm. Mediastinum/Nodes: No mediastinal mass. No mediastinal, hilar, or axillary lymphadenopathy. Right axillary lymph node dissection. Lungs/Pleura: The midline trachea and bronchi are patent. Posterior bibasilar dependent atelectasis. No focal airspace consolidation, pleural effusion, or pneumothorax. 3 mm nodule in the anterior basal left lower lobe (axial 67). Musculoskeletal: No acute fracture or destructive bone lesion. Upper Abdomen: For findings below the diaphragm, please see the separately dictated CT of the abdomen and pelvis report, which was performed concurrently. Review of the MIP images confirms the above findings. IMPRESSION: 1. No acute intrathoracic abnormality; specifically, no pulmonary embolism, pneumonia, or pleural effusion. 2. In the antero basal left lower lobe, there is a 3 mm nodule (axial 67). While this could represent a  region of fibrolinear scarring, continued follow-up per  routine oncologic protocols is recommended. Electronically Signed   By: Rogelia Myers M.D.   On: 09/30/2023 18:29   DG Chest 2 View Result Date: 09/30/2023 CLINICAL DATA:  Chest pain and shortness of breath that began 3 days ago EXAM: CHEST - 2 VIEW COMPARISON:  X-ray 11/18/2022 FINDINGS: No consolidation, pneumothorax or effusion. No edema. Normal cardiopericardial silhouette. Surgical clips along the right axillary region. IMPRESSION: No acute cardiopulmonary disease. Electronically Signed   By: Ranell Bring M.D.   On: 09/30/2023 15:05     Medical Consultants:   None.   Subjective:    Nicole Browning she is tolerating her diet, relates her abdominal pain is slightly better  Objective:    Vitals:   10/01/23 1552 10/01/23 2017 10/02/23 0406 10/02/23 0813  BP: 116/77 106/76 117/82 123/84  Pulse: 84 97 73 75  Resp: 18 16 18 16   Temp: 98.4 F (36.9 C) 98.3 F (36.8 C) 98.1 F (36.7 C) 98.3 F (36.8 C)  TempSrc:  Oral Oral   SpO2: 100% 100% 100% 100%  Weight:      Height:       SpO2: 100 %   Intake/Output Summary (Last 24 hours) at 10/02/2023 0852 Last data filed at 10/01/2023 2200 Gross per 24 hour  Intake 608.17 ml  Output 100 ml  Net 508.17 ml   Filed Weights   09/30/23 1414 09/30/23 1921  Weight: 55.8 kg 55.8 kg    Exam: General exam: In no acute distress. Respiratory system: Good air movement and clear to auscultation. Cardiovascular system: S1 & S2 heard, RRR. No JVD. Gastrointestinal system: Abdomen is nondistended, soft and nontender.  Extremities: No pedal edema. Skin: No rashes, lesions or ulcers Psychiatry: Judgement and insight appear normal. Mood & affect appropriate.  Data Reviewed:    Labs: Basic Metabolic Panel: Recent Labs  Lab 09/30/23 1420 10/01/23 0619  NA 138 136  K 3.3* 3.9  CL 102 100  CO2 25 26  GLUCOSE 140* 94  BUN 8 6  CREATININE 0.82 0.77  CALCIUM 9.3 9.1    GFR Estimated Creatinine Clearance: 71.6 mL/min (by C-G formula based on SCr of 0.77 mg/dL). Liver Function Tests: Recent Labs  Lab 09/30/23 1420  AST 14*  ALT 15  ALKPHOS 83  BILITOT 0.8  PROT 7.8  ALBUMIN 3.0*   Recent Labs  Lab 09/30/23 1420  LIPASE 25   No results for input(s): AMMONIA in the last 168 hours. Coagulation profile No results for input(s): INR, PROTIME in the last 168 hours. COVID-19 Labs  Recent Labs    09/30/23 1422  DDIMER 1.46*    Lab Results  Component Value Date   SARSCOV2NAA NEGATIVE 09/25/2020   SARSCOV2NAA NEGATIVE 03/10/2020   SARSCOV2NAA NEGATIVE 06/23/2019   SARSCOV2NAA NOT DETECTED 03/03/2019    CBC: Recent Labs  Lab 09/30/23 1420 10/01/23 0619  WBC 9.6 15.8*  NEUTROABS 7.2  --   HGB 10.9* 10.1*  HCT 34.4* 31.8*  MCV 84.9 86.4  PLT 435* 435*   Cardiac Enzymes: No results for input(s): CKTOTAL, CKMB, CKMBINDEX, TROPONINI in the last 168 hours. BNP (last 3 results) No results for input(s): PROBNP in the last 8760 hours. CBG: No results for input(s): GLUCAP in the last 168 hours. D-Dimer: Recent Labs    09/30/23 1422  DDIMER 1.46*   Hgb A1c: No results for input(s): HGBA1C in the last 72 hours. Lipid Profile: No results for input(s): CHOL, HDL, LDLCALC, TRIG, CHOLHDL, LDLDIRECT in the last 72 hours. Thyroid  function studies: No results for input(s): TSH, T4TOTAL, T3FREE, THYROIDAB in the last 72 hours.  Invalid input(s): FREET3 Anemia work up: No results for input(s): VITAMINB12, FOLATE, FERRITIN, TIBC, IRON , RETICCTPCT in the last 72 hours. Sepsis Labs: Recent Labs  Lab 09/30/23 1420 10/01/23 0619  WBC 9.6 15.8*   Microbiology Recent Results (from the past 240 hours)  Culture, blood (routine x 2)     Status: None (Preliminary result)   Collection Time: 09/30/23  8:12 PM   Specimen: BLOOD RIGHT FOREARM  Result Value Ref Range Status   Specimen  Description BLOOD RIGHT FOREARM  Final   Special Requests   Final    BOTTLES DRAWN AEROBIC AND ANAEROBIC Blood Culture adequate volume   Culture   Final    NO GROWTH 2 DAYS Performed at [facility], [address], [place], [place] [address]    Report Status PENDING  Incomplete  Culture, blood (routine x 2)     Status: None (Preliminary result)   Collection Time: 09/30/23  8:12 PM   Specimen: BLOOD  Result Value Ref Range Status   Specimen Description BLOOD LEFT ANTECUBITAL  Final   Special Requests   Final    BOTTLES DRAWN AEROBIC AND ANAEROBIC Blood Culture adequate volume   Culture   Final    NO GROWTH 2 DAYS Performed at [facility], [address], [place], [place] [address]    Report Status PENDING  Incomplete     Medications:    nicotine   14 mg Transdermal Daily   sodium chloride  flush  3 mL Intravenous Q12H   sodium chloride  flush  3 mL Intravenous Q12H   Continuous Infusions:  piperacillin -tazobactam (ZOSYN )  IV 3.375 g (10/02/23 0811)      LOS: 2 days   Nicole Browning  [facility]  10/02/2023, 8:52 AM
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
## 2023-10-02 NOTE — Progress Notes (Signed)
 Progress Note     Subjective: Patient denies having abdominal pain with food. She had some nausea and vomiting overnight but feels like it was related to pain medication not directly correlating to food. She had a bloody BM this morning which she attributes to straining. She reports that she has known hemorrhoids that occasionally bleeds whenever she strains.   Objective: Vital signs in last 24 hours: Temp:  [97.7 F (36.5 C)-98.4 F (36.9 C)] 98.1 F (36.7 C) (08/01 0406) Pulse Rate:  [73-97] 73 (08/01 0406) Resp:  [16-18] 18 (08/01 0406) BP: (104-117)/(76-82) 117/82 (08/01 0406) SpO2:  [100 %] 100 % (08/01 0406)    Intake/Output from previous day: 07/31 0701 - 08/01 0700 In: 608.2 [P.O.:480; I.V.:28.2; IV Piggyback:100] Out: 100 [Emesis/NG output:100] Intake/Output this shift: No intake/output data recorded.  PE: General: pleasant, WD, WN female who is laying in bed in NAD HEENT: facial folliculitis Heart: regular, rate, and rhythm.   Lungs:  Respiratory effort nonlabored Abd: Soft, mild tenderness to deep palpation. Mild to moderate distention, +BS, no masses, hernias, or organomegaly Psych: A&Ox3 with an appropriate affect.    Lab Results:  Recent Labs    09/30/23 1420 10/01/23 0619  WBC 9.6 15.8*  HGB 10.9* 10.1*  HCT 34.4* 31.8*  PLT 435* 435*   BMET Recent Labs    09/30/23 1420 10/01/23 0619  NA 138 136  K 3.3* 3.9  CL 102 100  CO2 25 26  GLUCOSE 140* 94  BUN 8 6  CREATININE 0.82 0.77  CALCIUM 9.3 9.1   PT/INR No results for input(s): LABPROT, INR in the last 72 hours. CMP     Component Value Date/Time   NA 136 10/01/2023 0619   K 3.9 10/01/2023 0619   CL 100 10/01/2023 0619   CO2 26 10/01/2023 0619   GLUCOSE 94 10/01/2023 0619   BUN 6 10/01/2023 0619   CREATININE 0.77 10/01/2023 0619   CREATININE 0.64 10/25/2020 1534   CALCIUM 9.1 10/01/2023 0619   PROT 7.8 09/30/2023 1420   ALBUMIN 3.0 (L) 09/30/2023 1420   AST 14 (L)  09/30/2023 1420   ALT 15 09/30/2023 1420   ALKPHOS 83 09/30/2023 1420   BILITOT 0.8 09/30/2023 1420   GFRNONAA >60 10/01/2023 0619   GFRAA >60 02/29/2016 0426   Lipase     Component Value Date/Time   LIPASE 25 09/30/2023 1420       Studies/Results: CT ABDOMEN PELVIS W CONTRAST Result Date: 09/30/2023 CLINICAL DATA:  Abdominal pain, acute, nonlocalized EXAM: CT ABDOMEN AND PELVIS WITH CONTRAST TECHNIQUE: Multidetector CT imaging of the abdomen and pelvis was performed using the standard protocol following bolus administration of intravenous contrast. RADIATION DOSE REDUCTION: This exam was performed according to the departmental dose-optimization program which includes automated exposure control, adjustment of the mA and/or kV according to patient size and/or use of iterative reconstruction technique. CONTRAST:  75mL OMNIPAQUE  IOHEXOL  350 MG/ML SOLN COMPARISON:  November 17, 2022 FINDINGS: Lower chest: For findings above the diaphragm, please see the separately dictated CT of the chest report, which was performed concurrently. Hepatobiliary: 1.6 x 2.1 cm hypodense lesion in the right hepatic dome, overall unchanged, previously measuring 2 x 1.5 cm.The small hypodense lesion in the right hepatic lobe (axial 10), is decreased in size in the interim, measuring 0.8 cm (previously 1.2 cm). Similar, wedge-shaped hypoenhancement in the posterior right hepatic lobe (segment 7). No radiopaque stones or wall thickening of the gallbladder. No intrahepatic or extrahepatic biliary ductal dilation. The  portal veins are patent. Pancreas: No mass or main ductal dilation. No peripancreatic inflammation or fluid collection. Spleen: A couple of hypodense masses again noted in the spleen, the largest measures 3.1 cm (previously 2.3 cm). Adrenals/Urinary Tract: No adrenal masses. Similar appearance of a couple of small cysts in the left kidney. No nephrolithiasis or hydronephrosis. Circumferential wall thickening of  the urinary bladder. Stomach/Bowel: The stomach is decompressed without focal abnormality. No small bowel wall thickening or inflammation. No small bowel obstruction.Normal appendix. Extensive, total colonic diverticulosis. Subtle inflammatory stranding about a couple of diverticula in the proximal ascending colon (for example, axial 47). Moderate wall thickening of the sigmoid colon with adjacent mesenteric inflammation. There is a thick-walled fluid collection in the posterior sigmoid mesocolon (axial 64), measuring 4.4 x 2.9 x 2.8 cm, with a gas-filled tract extending from the anterior aspect to the wall of the sigmoid colon (axial 60-62). Vascular/Lymphatic: No aortic aneurysm. Scattered aortoiliac atherosclerosis. No intraabdominal or pelvic lymphadenopathy. Reproductive: Age-related atrophy of the uterus and ovaries. No concerning adnexal mass.No free pelvic fluid. Other: No pneumoperitoneum or ascites. Musculoskeletal: No acute fracture or destructive lesion. IMPRESSION: 1. Moderate wall thickening of the sigmoid colon with adjacent mesocolonic inflammation, worrisome for changes from recurrent diverticulitis. Thick-walled, peripherally enhancing fluid collection along the posterior sigmoid mesocolon (axial 64), measuring 4.4 x 2.9 x 2.8 cm. There is a tubular gas-filled tract from the posterior wall of the sigmoid colon extending to this fluid collection, suggesting that this either is a blind-ending fistula or contained perforation. 2. Acute diverticulitis of the proximal ascending colon. No peridiverticular abscess or pneumoperitoneum. 3. Increasing size of the hypodense masses in the spleen, the largest now measures 3.1 cm (previously, 2.3 cm). 4. Unchanged appearance of the hypodense lesion in the right hepatic dome, measuring 1.6 x 2.1 cm (previously, 1.5 x 2 cm). A couple of the additional hypodense abscesses in the right hepatic lobe have decreased in size. 5. For findings above the diaphragm, please  see the separately dictated CT of the chest report, which was performed concurrently. Aortic Atherosclerosis (ICD10-I70.0). Electronically Signed   By: Rogelia Myers M.D.   On: 09/30/2023 18:43   CT Angio Chest PE W/Cm &/Or Wo Cm Result Date: 09/30/2023 CLINICAL DATA:  Pulmonary embolism (PE) suspected, low to intermediate prob, positive D-dimer EXAM: CT ANGIOGRAPHY CHEST WITH CONTRAST TECHNIQUE: Multidetector CT imaging of the chest was performed using the standard protocol during bolus administration of intravenous contrast. Multiplanar CT image reconstructions and MIPs were obtained to evaluate the vascular anatomy. RADIATION DOSE REDUCTION: This exam was performed according to the departmental dose-optimization program which includes automated exposure control, adjustment of the mA and/or kV according to patient size and/or use of iterative reconstruction technique. CONTRAST:  75mL OMNIPAQUE  IOHEXOL  350 MG/ML SOLN COMPARISON:  September 27, 2020 FINDINGS: Pulmonary Embolism: No pulmonary embolism. Cardiovascular: No cardiomegaly or pericardial effusion.No aortic aneurysm. Mediastinum/Nodes: No mediastinal mass. No mediastinal, hilar, or axillary lymphadenopathy. Right axillary lymph node dissection. Lungs/Pleura: The midline trachea and bronchi are patent. Posterior bibasilar dependent atelectasis. No focal airspace consolidation, pleural effusion, or pneumothorax. 3 mm nodule in the anterior basal left lower lobe (axial 67). Musculoskeletal: No acute fracture or destructive bone lesion. Upper Abdomen: For findings below the diaphragm, please see the separately dictated CT of the abdomen and pelvis report, which was performed concurrently. Review of the MIP images confirms the above findings. IMPRESSION: 1. No acute intrathoracic abnormality; specifically, no pulmonary embolism, pneumonia, or pleural effusion. 2. In the antero  basal left lower lobe, there is a 3 mm nodule (axial 67). While this could represent a  region of fibrolinear scarring, continued follow-up per routine oncologic protocols is recommended. Electronically Signed   By: Rogelia Myers M.D.   On: 09/30/2023 18:29   DG Chest 2 View Result Date: 09/30/2023 CLINICAL DATA:  Chest pain and shortness of breath that began 3 days ago EXAM: CHEST - 2 VIEW COMPARISON:  X-ray 11/18/2022 FINDINGS: No consolidation, pneumothorax or effusion. No edema. Normal cardiopericardial silhouette. Surgical clips along the right axillary region. IMPRESSION: No acute cardiopulmonary disease. Electronically Signed   By: Ranell Bring M.D.   On: 09/30/2023 15:05    Anti-infectives: Anti-infectives (From admission, onward)    Start     Dose/Rate Route Frequency Ordered Stop   10/01/23 0000  piperacillin -tazobactam (ZOSYN ) IVPB 3.375 g        3.375 g 12.5 mL/hr over 240 Minutes Intravenous Every 8 hours 09/30/23 2030     09/30/23 1915  piperacillin -tazobactam (ZOSYN ) IVPB 3.375 g        3.375 g 100 mL/hr over 30 Minutes Intravenous  Once 09/30/23 1903 09/30/23 1942        Assessment/Plan Diverticulitis vs IBD flare  - CT 7/30 with moderate thickening of sigmoid colon with adjacent inflammation and possible abscess along posterior mesocolon measuring 4.4 x 2.9 x 2.8 cm, question fistula between collection and colon vs contained perforation. Acute diverticulitis of proximal ascending colon. Hypodense splenic masses increased in size from prior imaging. Hypodense lesion in right hepatic dome decreased in size.  - WBC up to 15.8 on 7/31  from 9.6, she remains afebrile and HD stable - some vomiting overnight but no pain with eating and having bowel function  - Decision stands for no indication for emergent surgical intervention based on exam today  - agree with GI consult to weigh in given concern for possible IBD and concern for possible terminal ileitis on colorectal surgery review of CT from 7/30. - if continued nausea and vomiting would recommend decreasing  to CLD or FLD but if no further issues and no worsening pain with PO intake then ok to have diet as tolerated from surgery standpoint.  FEN: HH diet, IVF per [name] VTE: ok to have SQH or LMWH from surgical standpoint ID: Zosyn  7/30 >>  - per [name] -  GAD/Depression Hx of breast cancer Hx of perirectal abscess GERD Tobacco abuse   LOS: 2 days   I reviewed hospitalist notes, last 24 h vitals and pain scores, last 48 h intake and output, last 24 h labs and trends, and last 24 h imaging results.  This care required moderate level of medical decision making.    Eulah Hammonds, [facility] 10/02/2023, 8:04 AM Please see Amion for pager number during day hours 7:00am-4:30pm

## 2023-10-03 DIAGNOSIS — Z8719 Personal history of other diseases of the digestive system: Secondary | ICD-10-CM | POA: Diagnosis not present

## 2023-10-03 DIAGNOSIS — D7389 Other diseases of spleen: Secondary | ICD-10-CM | POA: Diagnosis not present

## 2023-10-03 DIAGNOSIS — K769 Liver disease, unspecified: Secondary | ICD-10-CM | POA: Diagnosis not present

## 2023-10-03 DIAGNOSIS — K5792 Diverticulitis of intestine, part unspecified, without perforation or abscess without bleeding: Secondary | ICD-10-CM | POA: Diagnosis not present

## 2023-10-03 MED ORDER — POLYETHYLENE GLYCOL 3350 17 G PO PACK
17.0000 g | PACK | Freq: Every day | ORAL | Status: DC
  Administered 2023-10-03 – 2023-10-04 (×2): 17 g via ORAL
  Filled 2023-10-03 (×2): qty 1

## 2023-10-03 NOTE — Progress Notes (Signed)
 [facility] PROGRESS NOTE    Progress Note  Nicole Browning  FMW:[id number] DOB: 03/13/1970 DOA: 09/30/2023 PCP: Nicole Rosaline SQUIBB, NP     Brief Narrative:   Nicole Browning is an 53 y.o. female past medical history significant for breast cancer, diverticulitis, chronic smoking and  Crohn's disease comes into the ED complaining of abdominal pain that started 4 days prior to admission and radiates to her chest in the ED she was found to febrile with thrombocytosis CT angio of the chest showed no acute PE and CT scan of the abdomen showed sigmoid diverticulitis with an abscess measuring 4 x 3 x 3 with possible fistula or perforation, increase in size splenic mass   Assessment/Plan:   Complicated acute diverticulitis/ Hepatic lesion/splenic lesion/history of Crohn's disease: Continue IV empiric antibiotics.  Continue IV antibiotics for an additional 24 hours then she will need at least 4 weeks of antibiotics as an outpatient. GI to follow-up in 6 weeks for repeat colonoscopy. Patient is tolerating her diet. She has remained afebrile and her leukocytosis resolved.  Hypokalemia Repleted now resolved  History of general anxiety disorder and depression: Currently on no medications will need to follow-up with PCP as an outpatient.  History of breast cancer Noted.  Tobacco abuse: Continue getting patch   DVT prophylaxis: lovenox  Family Communication:none Status is: Inpatient Remains inpatient appropriate because: Acute ulcerative colitis flare    Code Status:     Code Status Orders  (From admission, onward)           Start     Ordered   09/30/23 1932  Full code  Continuous       Question:  By:  Answer:  Consent: discussion documented in EHR   09/30/23 1931           Code Status History     Date Active Date Inactive Code Status Order ID Comments User Context   11/17/2022 1232 11/20/2022 2124 Full Code [id number]  Nicole Marsa NOVAK, MD ED   09/25/2020 2329  10/05/2020 2317 Full Code [id number]  Nicole Redia SAILOR, MD ED   02/24/2016 1903 02/29/2016 1639 Full Code [id number]  Nicole Shoulders, MD Inpatient         IV Access:   Peripheral IV   Procedures and diagnostic studies:   No results found.    Medical Consultants:   None.   Subjective:    Nicole Browning abdominal pain is better she is tolerating her diet.  Objective:    Vitals:   10/02/23 1518 10/02/23 2025 10/03/23 0523 10/03/23 0757  BP: 120/80 98/81 100/70 105/70  Pulse: 81 84 94 75  Resp: 16 16 19 18   Temp: 98.4 F (36.9 C) 98 F (36.7 C) 98.4 F (36.9 C) 98.8 F (37.1 C)  TempSrc:  Oral Oral Oral  SpO2: 100% 100% 100% 100%  Weight:      Height:       SpO2: 100 %   Intake/Output Summary (Last 24 hours) at 10/03/2023 0855 Last data filed at 10/02/2023 1245 Gross per 24 hour  Intake 360 ml  Output --  Net 360 ml   Filed Weights   09/30/23 1414 09/30/23 1921  Weight: 55.8 kg 55.8 kg    Exam: General exam: In no acute distress. Respiratory system: Good air movement and clear to auscultation. Cardiovascular system: S1 & S2 heard, RRR. No JVD. Gastrointestinal system: Abdomen is nondistended, soft and nontender.  Extremities: No pedal edema. Skin: No rashes, lesions or ulcers Psychiatry: Judgement and  insight appear normal. Mood & affect appropriate.  Data Reviewed:    Labs: Basic Metabolic Panel: Recent Labs  Lab 09/30/23 1420 10/01/23 0619  NA 138 136  K 3.3* 3.9  CL 102 100  CO2 25 26  GLUCOSE 140* 94  BUN 8 6  CREATININE 0.82 0.77  CALCIUM 9.3 9.1   GFR Estimated Creatinine Clearance: 71.6 mL/min (by C-G formula based on SCr of 0.77 mg/dL). Liver Function Tests: Recent Labs  Lab 09/30/23 1420  AST 14*  ALT 15  ALKPHOS 83  BILITOT 0.8  PROT 7.8  ALBUMIN 3.0*   Recent Labs  Lab 09/30/23 1420  LIPASE 25   No results for input(s): AMMONIA in the last 168 hours. Coagulation profile No results for input(s): INR,  PROTIME in the last 168 hours. COVID-19 Labs  Recent Labs    09/30/23 1422 10/02/23 0913  DDIMER 1.46*  --   CRP  --  3.8*    Lab Results  Component Value Date   SARSCOV2NAA NEGATIVE 09/25/2020   SARSCOV2NAA NEGATIVE 03/10/2020   SARSCOV2NAA NEGATIVE 06/23/2019   SARSCOV2NAA NOT DETECTED 03/03/2019    CBC: Recent Labs  Lab 09/30/23 1420 10/01/23 0619 10/02/23 0913  WBC 9.6 15.8* 7.9  NEUTROABS 7.2  --   --   HGB 10.9* 10.1* 10.3*  HCT 34.4* 31.8* 32.3*  MCV 84.9 86.4 85.7  PLT 435* 435* 435*   Cardiac Enzymes: No results for input(s): CKTOTAL, CKMB, CKMBINDEX, TROPONINI in the last 168 hours. BNP (last 3 results) No results for input(s): PROBNP in the last 8760 hours. CBG: No results for input(s): GLUCAP in the last 168 hours. D-Dimer: Recent Labs    09/30/23 1422  DDIMER 1.46*   Hgb A1c: No results for input(s): HGBA1C in the last 72 hours. Lipid Profile: No results for input(s): CHOL, HDL, LDLCALC, TRIG, CHOLHDL, LDLDIRECT in the last 72 hours. Thyroid function studies: No results for input(s): TSH, T4TOTAL, T3FREE, THYROIDAB in the last 72 hours.  Invalid input(s): FREET3 Anemia work up: No results for input(s): VITAMINB12, FOLATE, FERRITIN, TIBC, IRON , RETICCTPCT in the last 72 hours. Sepsis Labs: Recent Labs  Lab 09/30/23 1420 10/01/23 0619 10/02/23 0913  WBC 9.6 15.8* 7.9   Microbiology Recent Results (from the past 240 hours)  Culture, blood (routine x 2)     Status: None (Preliminary result)   Collection Time: 09/30/23  8:12 PM   Specimen: BLOOD RIGHT FOREARM  Result Value Ref Range Status   Specimen Description BLOOD RIGHT FOREARM  Final   Special Requests   Final    BOTTLES DRAWN AEROBIC AND ANAEROBIC Blood Culture adequate volume   Culture   Final    NO GROWTH 3 DAYS Performed at [facility], [address] [place], [place] [address]    Report Status PENDING  Incomplete   Culture, blood (routine x 2)     Status: None (Preliminary result)   Collection Time: 09/30/23  8:12 PM   Specimen: BLOOD  Result Value Ref Range Status   Specimen Description BLOOD LEFT ANTECUBITAL  Final   Special Requests   Final    BOTTLES DRAWN AEROBIC AND ANAEROBIC Blood Culture adequate volume   Culture   Final    NO GROWTH 3 DAYS Performed at [facility], [address] [place], [place] [address]    Report Status PENDING  Incomplete     Medications:    nicotine   14 mg Transdermal Daily   polyethylene glycol  17  g Oral Daily   sodium chloride  flush  3 mL Intravenous Q12H   sodium chloride  flush  3 mL Intravenous Q12H   Continuous Infusions:  piperacillin -tazobactam (ZOSYN )  IV 3.375 g (10/03/23 0059)      LOS: 3 days   Erle Odell Castor  [facility]  10/03/2023, 8:55 AM

## 2023-10-03 NOTE — Progress Notes (Signed)
 [facility] ROUNDING NOTE   Subjective: Patient another episode of abdominal pain (7/10) which resolved quickly with tramadol .  Patient doesn't think she had a bowel movement yesterday.  Would like something to help her go.  No fevers/chills. No labs yet this morning. CRP 3.8 which is significantly less than her previous admission. Calprotectin pending.    Objective: Vital signs in last 24 hours: Temp:  [98 F (36.7 C)-98.8 F (37.1 C)] 98.8 F (37.1 C) (08/02 0757) Pulse Rate:  [75-94] 75 (08/02 0757) Resp:  [16-19] 18 (08/02 0757) BP: (98-120)/(70-81) 105/70 (08/02 0757) SpO2:  [100 %] 100 % (08/02 0757) Last BM Date : 10/02/23 General: NAD, pleasant Af Am female Lungs:  CTA b/l, no w/r/r Heart:  RRR, no m/r/g Abdomen:  Soft, minimal tenderness to palpation, ND, +BS Ext:  No c/c/e    Intake/Output from previous day: 08/01 0701 - 08/02 0700 In: 360 [P.O.:360] Out: -  Intake/Output this shift: No intake/output data recorded.   Lab Results: Recent Labs    09/30/23 1420 10/01/23 0619 10/02/23 0913  WBC 9.6 15.8* 7.9  HGB 10.9* 10.1* 10.3*  PLT 435* 435* 435*  MCV 84.9 86.4 85.7   BMET Recent Labs    09/30/23 1420 10/01/23 0619  NA 138 136  K 3.3* 3.9  CL 102 100  CO2 25 26  GLUCOSE 140* 94  BUN 8 6  CREATININE 0.82 0.77  CALCIUM 9.3 9.1   LFT Recent Labs    09/30/23 1420  PROT 7.8  ALBUMIN 3.0*  AST 14*  ALT 15  ALKPHOS 83  BILITOT 0.8   PT/INR No results for input(s): INR in the last 72 hours.    Imaging/Other results: No results found.    Assessment and Plan:  53 year old female with confusing history of colitis and diverticulitis, chronic hepatic fluid collections going back to 2022. She had a perirectal abscess in 2017 that was treated surgically In 2022 she had a large perianal abscess and had endoscopic findings suggestive of inflammatory bowel disease (diffuse, mild inflammatory changes throughout the colon  and mild ileitis). In 2024 she was admitted with CT findings suggestive of complicated sigmoid diverticulitis with possible hepatic abscesses, treated with antibiotics. Follow up colonoscopy in Jan 2025 showed no evidence of colitis or Crohn's disease.  Diverticulosis and suspected diverticulitis (whitish exudates) noted.   Admitted again with vague nonsevere abdominal pain, no fevers/chills but again with CT findings suggestive of complicated sigmoid diverticulitis, uncomplicated ascending colon diverticulitis, persistent hepatic hypodensities and splenic lesions.   Diverticulitis - Continue IV antibiotics today; consider another 24 hrs then transition to PO.  Would consider another prolonged course of antibiotics (would suggest Augmentin  for 4 weeks) and then repeat CT scan   Inflammatory bowel disease No evidence of IBD on colonoscopy in Jan 2025.  CT findings more consistent with complicated diverticulitis.  Patient denies chronic GI symptoms.  Unclear what role IBD is playing at this point - Tentatively plan to repeat colonoscopy as outpatient to reassess for active colitis/ileitis - F/u fecal calprotectin; will trend as outpatient - IBD serologies not consistent with IBD. - No role colonoscopy now given high risk of perforation - No role for initiation of biologics given intra-abdominal abscesses/phlegmon   Hepatic/splenic hypodensities - Unclear etiology.  Presumably infectious, related to diverticulitis      Glendia FORBES Holt, MD  10/03/2023, 10:23 AM [facility]

## 2023-10-04 DIAGNOSIS — F17210 Nicotine dependence, cigarettes, uncomplicated: Secondary | ICD-10-CM | POA: Diagnosis not present

## 2023-10-04 DIAGNOSIS — K769 Liver disease, unspecified: Secondary | ICD-10-CM | POA: Diagnosis not present

## 2023-10-04 DIAGNOSIS — K5792 Diverticulitis of intestine, part unspecified, without perforation or abscess without bleeding: Secondary | ICD-10-CM | POA: Diagnosis not present

## 2023-10-04 LAB — CALPROTECTIN, FECAL: Calprotectin, Fecal: >8000 ug/g — ABNORMAL HIGH (ref 0–120)

## 2023-10-04 MED ORDER — TRAMADOL HCL 50 MG PO TABS: 50.0000 mg | ORAL_TABLET | Freq: Four times a day (QID) | ORAL | 0 refills | Status: AC | PRN

## 2023-10-04 MED ORDER — SORBITOL 70 % SOLN
30.0000 mL | Freq: Once | Status: AC
  Administered 2023-10-04: 30 mL via ORAL
  Filled 2023-10-04: qty 30

## 2023-10-04 MED ORDER — AMOXICILLIN-POT CLAVULANATE 875-125 MG PO TABS: 1.0000 | ORAL_TABLET | Freq: Two times a day (BID) | ORAL | 0 refills | Status: AC

## 2023-10-04 MED ORDER — AMOXICILLIN-POT CLAVULANATE 875-125 MG PO TABS
1.0000 | ORAL_TABLET | Freq: Two times a day (BID) | ORAL | Status: DC
  Administered 2023-10-04: 1 via ORAL
  Filled 2023-10-04: qty 1

## 2023-10-04 NOTE — Plan of Care (Signed)
  Problem: Clinical Measurements: Goal: Ability to maintain clinical measurements within normal limits will improve Outcome: Progressing Goal: Diagnostic test results will improve Outcome: Progressing   Problem: Pain Managment: Goal: General experience of comfort will improve and/or be controlled Outcome: Progressing   Problem: Safety: Goal: Ability to remain free from injury will improve Outcome: Progressing

## 2023-10-04 NOTE — Discharge Summary (Signed)
 Physician Discharge Summary  Nicole Browning FMW:[id number] DOB: 05-Oct-1970 DOA: 09/30/2023  PCP: [name], NP  Admit date: 09/30/2023 Discharge date: 10/04/2023  Admitted From: Home Disposition:  Home  Recommendations for Outpatient Follow-up:  Follow up with GI in 1-2 weeks Please obtain BMP/CBC in one week   Home Health:No Equipment/Devices:None  Discharge Condition:Stable CODE STATUS:Full Diet recommendation: Heart Healthy   Brief/Interim Summary:  53 y.o. 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 GI was consulted recommended no endoscopic evaluation due to high risk of perforation. The recommended to continue antibiotics, she was able to tolerate her diet. She was transition to oral Augmentin  which should continue for 4 weeks. Follow-up with them as an outpatient. Follow-up with them in 6 weeks for  colonoscopy as an outpatient.  Hypokalemia: Repleted orally now improved.  General Anxiety and depression: Continue current meds follow-up with PCP.  History of brain cancer: Noted.  Tobacco abuse:     Discharge Instructions  Discharge Instructions     Diet - low sodium heart healthy   Complete by: As directed    Increase activity slowly   Complete by: As directed       Allergies as of 10/04/2023       Reactions   Bactrim  [sulfamethoxazole -trimethoprim ] Hives   Immediate body wide hives   Percocet [oxycodone-acetaminophen ] Nausea Only   Valium [diazepam] Nausea Only        Medication List     TAKE these medications    amoxicillin -clavulanate 875-125 MG tablet Commonly known as: AUGMENTIN  Take 1 tablet by mouth every 12 (twelve) hours for 28 days.   bismuth  subsalicylate 262 MG/15ML suspension Commonly known as: PEPTO BISMOL Take 30 mLs by mouth every 6 (six) hours as needed for indigestion.   gabapentin  100 MG capsule Commonly known as: NEURONTIN  Take 1 capsule (100 mg total) by mouth 3 (three) times daily. What changed:  when to take this reasons to take this   ketoconazole  2 % cream Commonly known as: NIZORAL  Apply 1 Application topically daily.   POTASSIUM PO Take 1 tablet by mouth daily.   pregabalin 50 MG capsule Commonly known as: LYRICA Take 50 mg by mouth daily.   traMADol  50 MG tablet Commonly known as: ULTRAM  Take 1 tablet (50 mg total) by mouth every 6 (six) hours as needed for severe pain (pain score 7-10).   WOMENS MULTIVITAMIN PO Take 1 tablet by mouth daily.        Allergies  Allergen Reactions   Bactrim  [Sulfamethoxazole -Trimethoprim ] Hives    Immediate body wide hives   Percocet [Oxycodone-Acetaminophen ] Nausea Only   Valium [Diazepam] Nausea Only    Consultations: General Surgery GI   Procedures/Studies: CT ABDOMEN PELVIS W CONTRAST Result Date: 09/30/2023 CLINICAL DATA:  Abdominal pain, acute, nonlocalized  EXAM: CT ABDOMEN AND PELVIS WITH CONTRAST TECHNIQUE: Multidetector CT imaging of the abdomen and pelvis was performed using the standard protocol following bolus administration of intravenous contrast. RADIATION DOSE REDUCTION: This exam was performed according to the departmental dose-optimization program which includes automated exposure control, adjustment of the mA and/or kV according to patient size and/or use of iterative reconstruction technique. CONTRAST:  75mL OMNIPAQUE  IOHEXOL  350 MG/ML SOLN COMPARISON:  November 17, 2022 FINDINGS: Lower chest: For findings above the diaphragm, please see the separately dictated CT of the chest report, which was performed concurrently. Hepatobiliary: 1.6 x 2.1 cm hypodense lesion in the right hepatic dome, overall unchanged, previously measuring 2 x 1.5 cm.The small hypodense lesion in the right hepatic lobe (axial 10), is decreased in size in the interim, measuring 0.8 cm (previously 1.2 cm). Similar, wedge-shaped hypoenhancement in the posterior right hepatic lobe (segment 7). No radiopaque stones or wall thickening of the gallbladder. No intrahepatic or extrahepatic biliary ductal dilation. The portal veins are patent. Pancreas: No mass or main ductal dilation. No peripancreatic inflammation or fluid collection. Spleen: A couple of hypodense masses again noted in the spleen, the largest measures 3.1 cm (previously 2.3 cm). Adrenals/Urinary Tract: No adrenal masses. Similar appearance of a couple of small cysts in the left kidney. No nephrolithiasis or hydronephrosis. Circumferential wall thickening of the urinary bladder. Stomach/Bowel: The stomach is decompressed without focal abnormality. No small bowel wall thickening or inflammation. No small bowel obstruction.Normal appendix. Extensive, total colonic diverticulosis. Subtle inflammatory stranding about a couple of diverticula in the proximal ascending colon (for example, axial 47). Moderate wall thickening of the  sigmoid colon with adjacent mesenteric inflammation. There is a thick-walled fluid collection in the posterior sigmoid mesocolon (axial 64), measuring 4.4 x 2.9 x 2.8 cm, with a gas-filled tract extending from the anterior aspect to the wall of the sigmoid colon (axial 60-62). Vascular/Lymphatic: No aortic aneurysm. Scattered aortoiliac atherosclerosis. No intraabdominal or pelvic lymphadenopathy. Reproductive: Age-related atrophy of the uterus and ovaries. No concerning adnexal mass.No free pelvic fluid. Other: No pneumoperitoneum or ascites. Musculoskeletal: No acute fracture or destructive lesion. IMPRESSION: 1. Moderate wall thickening of the sigmoid colon with adjacent mesocolonic inflammation, worrisome for changes from recurrent diverticulitis. Thick-walled, peripherally enhancing fluid collection along the posterior sigmoid mesocolon (axial 64), measuring 4.4 x 2.9 x 2.8 cm. There is a tubular gas-filled tract from the posterior wall of the sigmoid colon extending to this fluid collection, suggesting that this either is a blind-ending fistula or contained perforation. 2. Acute diverticulitis of the proximal ascending colon. No peridiverticular abscess or pneumoperitoneum. 3. Increasing size of the hypodense masses in the spleen, the largest now measures 3.1 cm (previously, 2.3 cm). 4. Unchanged appearance of the hypodense lesion in the right hepatic dome, measuring 1.6 x 2.1 cm (previously, 1.5 x 2 cm). A couple of the additional hypodense abscesses in the right hepatic lobe have decreased in size. 5. For findings above the diaphragm, please see the separately dictated CT of the chest report, which was performed concurrently. Aortic Atherosclerosis (ICD10-I70.0). Electronically Signed   By: Rogelia Myers M.D.   On: 09/30/2023 18:43   CT Angio Chest PE W/Cm &/Or Wo Cm Result  Date: 09/30/2023 CLINICAL DATA:  Pulmonary embolism (PE) suspected, low to intermediate prob, positive D-dimer EXAM: CT ANGIOGRAPHY  CHEST WITH CONTRAST TECHNIQUE: Multidetector CT imaging of the chest was performed using the standard protocol during bolus administration of intravenous contrast. Multiplanar CT image reconstructions and MIPs were obtained to evaluate the vascular anatomy. RADIATION DOSE REDUCTION: This exam was performed according to the departmental dose-optimization program which includes automated exposure control, adjustment of the mA and/or kV according to patient size and/or use of iterative reconstruction technique. CONTRAST:  75mL OMNIPAQUE  IOHEXOL  350 MG/ML SOLN COMPARISON:  September 27, 2020 FINDINGS: Pulmonary Embolism: No pulmonary embolism. Cardiovascular: No cardiomegaly or pericardial effusion.No aortic aneurysm. Mediastinum/Nodes: No mediastinal mass. No mediastinal, hilar, or axillary lymphadenopathy. Right axillary lymph node dissection. Lungs/Pleura: The midline trachea and bronchi are patent. Posterior bibasilar dependent atelectasis. No focal airspace consolidation, pleural effusion, or pneumothorax. 3 mm nodule in the anterior basal left lower lobe (axial 67). Musculoskeletal: No acute fracture or destructive bone lesion. Upper Abdomen: For findings below the diaphragm, please see the separately dictated CT of the abdomen and pelvis report, which was performed concurrently. Review of the MIP images confirms the above findings. IMPRESSION: 1. No acute intrathoracic abnormality; specifically, no pulmonary embolism, pneumonia, or pleural effusion. 2. In the antero basal left lower lobe, there is a 3 mm nodule (axial 67). While this could represent a region of fibrolinear scarring, continued follow-up per routine oncologic protocols is recommended. Electronically Signed   By: Rogelia Myers M.D.   On: 09/30/2023 18:29   DG Chest 2 View Result Date: 09/30/2023 CLINICAL DATA:  Chest pain and shortness of breath that began 3 days ago EXAM: CHEST - 2 VIEW COMPARISON:  X-ray 11/18/2022 FINDINGS: No consolidation,  pneumothorax or effusion. No edema. Normal cardiopericardial silhouette. Surgical clips along the right axillary region. IMPRESSION: No acute cardiopulmonary disease. Electronically Signed   By: Ranell Bring M.D.   On: 09/30/2023 15:05   (Echo, Carotid, EGD, Colonoscopy, ERCP)    Subjective: No complaints tolerating her diet.  Discharge Exam: Vitals:   10/04/23 0506 10/04/23 0755  BP: 104/73 101/74  Pulse: 97 83  Resp: 16 18  Temp: 98.5 F (36.9 C) 98.3 F (36.8 C)  SpO2: 100% 94%   Vitals:   10/03/23 1607 10/03/23 1956 10/04/23 0506 10/04/23 0755  BP: 107/68 112/77 104/73 101/74  Pulse: 76 (!) 102 97 83  Resp: 18 17 16 18   Temp: 98.9 F (37.2 C) 98.8 F (37.1 C) 98.5 F (36.9 C) 98.3 F (36.8 C)  TempSrc: Oral Oral Oral Oral  SpO2: 100% 100% 100% 94%  Weight:      Height:        General: Pt is alert, awake, not in acute distress Cardiovascular: RRR, S1/S2 +, no rubs, no gallops Respiratory: CTA bilaterally, no wheezing, no rhonchi Abdominal: Soft, NT, ND, bowel sounds + Extremities: no edema, no cyanosis    The results of significant diagnostics from this hospitalization (including imaging, microbiology, ancillary and laboratory) are listed below for reference.     Microbiology: Recent Results (from the past 240 hours)  Culture, blood (routine x 2)     Status: None (Preliminary result)   Collection Time: 09/30/23  8:12 PM   Specimen: BLOOD RIGHT FOREARM  Result Value Ref Range Status   Specimen Description BLOOD RIGHT FOREARM  Final   Special Requests   Final    BOTTLES DRAWN AEROBIC AND ANAEROBIC Blood Culture adequate volume   Culture   Final  NO GROWTH 3 DAYS Performed at [facility], [address], [place], [place] [address]    Report Status PENDING  Incomplete  Culture, blood (routine x 2)     Status: None (Preliminary result)   Collection Time: 09/30/23  8:12 PM   Specimen: BLOOD  Result Value Ref Range Status   Specimen Description  BLOOD LEFT ANTECUBITAL  Final   Special Requests   Final    BOTTLES DRAWN AEROBIC AND ANAEROBIC Blood Culture adequate volume   Culture   Final    NO GROWTH 3 DAYS Performed at [facility], [address], [place], [place] [address]    Report Status PENDING  Incomplete  Calprotectin, Fecal     Status: Abnormal   Collection Time: 10/02/23  7:44 AM   Specimen: Stool  Result Value Ref Range Status   Calprotectin, Fecal >8,000 (H) 0 - 120 ug/g Final    Comment: (NOTE) **Results verified by repeat testing** Concentration     Interpretation   Follow-Up < 5 - 50 ug/g     Normal           None >50 -120 ug/g     Borderline       Re-evaluate in 4-6 weeks    >120 ug/g     Abnormal         Repeat as clinically                                   indicated Performed At: [facility] [address] [place], [place] [id number] Jennette Shorter MD Ey:[id number]      Labs: BNP (last 3 results) No results for input(s): BNP in the last 8760 hours. Basic Metabolic Panel: Recent Labs  Lab 09/30/23 1420 10/01/23 0619  NA 138 136  K 3.3* 3.9  CL 102 100  CO2 25 26  GLUCOSE 140* 94  BUN 8 6  CREATININE 0.82 0.77  CALCIUM 9.3 9.1   Liver Function Tests: Recent Labs  Lab 09/30/23 1420  AST 14*  ALT 15  ALKPHOS 83  BILITOT 0.8  PROT 7.8  ALBUMIN 3.0*   Recent Labs  Lab 09/30/23 1420  LIPASE 25   No results for input(s): AMMONIA in the last 168 hours. CBC: Recent Labs  Lab 09/30/23 1420 10/01/23 0619 10/02/23 0913  WBC 9.6 15.8* 7.9  NEUTROABS 7.2  --   --   HGB 10.9* 10.1* 10.3*  HCT 34.4* 31.8* 32.3*  MCV 84.9 86.4 85.7  PLT 435* 435* 435*   Cardiac Enzymes: No results for input(s): CKTOTAL, CKMB, CKMBINDEX, TROPONINI in the last 168 hours. BNP: Invalid input(s): POCBNP CBG: No results for input(s): GLUCAP in the last 168 hours. D-Dimer No results for input(s): DDIMER in the last 72 hours. Hgb A1c No results for input(s): HGBA1C  in the last 72 hours. Lipid Profile No results for input(s): CHOL, HDL, LDLCALC, TRIG, CHOLHDL, LDLDIRECT in the last 72 hours. Thyroid function studies No results for input(s): TSH, T4TOTAL, T3FREE, THYROIDAB in the last 72 hours.  Invalid input(s): FREET3 Anemia work up No results for input(s): VITAMINB12, FOLATE, FERRITIN, TIBC, IRON , RETICCTPCT in the last 72 hours. Urinalysis    Component Value Date/Time   COLORURINE YELLOW 11/17/2022 1045   APPEARANCEUR CLEAR 11/17/2022 1045   LABSPEC 1.015 11/17/2022 1045   PHURINE 6.0 11/17/2022 1045   GLUCOSEU NEGATIVE 11/17/2022 1045   HGBUR LARGE (A)  11/17/2022 1045   BILIRUBINUR SMALL (A) 11/17/2022 1045   KETONESUR 15 (A) 11/17/2022 1045   PROTEINUR 100 (A) 11/17/2022 1045   NITRITE NEGATIVE 11/17/2022 1045   LEUKOCYTESUR NEGATIVE 11/17/2022 1045   Sepsis Labs Recent Labs  Lab 09/30/23 1420 10/01/23 0619 10/02/23 0913  WBC 9.6 15.8* 7.9   Microbiology Recent Results (from the past 240 hours)  Culture, blood (routine x 2)     Status: None (Preliminary result)   Collection Time: 09/30/23  8:12 PM   Specimen: BLOOD RIGHT FOREARM  Result Value Ref Range Status   Specimen Description BLOOD RIGHT FOREARM  Final   Special Requests   Final    BOTTLES DRAWN AEROBIC AND ANAEROBIC Blood Culture adequate volume   Culture   Final    NO GROWTH 3 DAYS Performed at [facility], [address], [place], [place] [address]    Report Status PENDING  Incomplete  Culture, blood (routine x 2)     Status: None (Preliminary result)   Collection Time: 09/30/23  8:12 PM   Specimen: BLOOD  Result Value Ref Range Status   Specimen Description BLOOD LEFT ANTECUBITAL  Final   Special Requests   Final    BOTTLES DRAWN AEROBIC AND ANAEROBIC Blood Culture adequate volume   Culture   Final    NO GROWTH 3 DAYS Performed at [facility], [address], [place], [place] [address]    Report Status PENDING   Incomplete  Calprotectin, Fecal     Status: Abnormal   Collection Time: 10/02/23  7:44 AM   Specimen: Stool  Result Value Ref Range Status   Calprotectin, Fecal >8,000 (H) 0 - 120 ug/g Final    Comment: (NOTE) **Results verified by repeat testing** Concentration     Interpretation   Follow-Up < 5 - 50 ug/g     Normal           None >50 -120 ug/g     Borderline       Re-evaluate in 4-6 weeks    >120 ug/g     Abnormal         Repeat as clinically                                   indicated Performed At: [facility] [address] [place], [place] [id number] Jennette Shorter MD Ey:[id number]      Time coordinating discharge: Over 35 minutes  SIGNED:   Erle Odell Castor, MD  [facility] 10/04/2023, 7:57 AM Pager   If 7PM-7AM, please contact night-coverage [email or web address] Password [name]

## 2023-10-05 ENCOUNTER — Other Ambulatory Visit: Payer: Self-pay

## 2023-10-05 ENCOUNTER — Telehealth: Payer: Self-pay

## 2023-10-05 DIAGNOSIS — K5732 Diverticulitis of large intestine without perforation or abscess without bleeding: Secondary | ICD-10-CM

## 2023-10-05 LAB — CULTURE, BLOOD (ROUTINE X 2)
Culture: NO GROWTH
Culture: NO GROWTH
Special Requests: ADEQUATE
Special Requests: ADEQUATE

## 2023-10-05 NOTE — Transitions of Care (Post Inpatient/ED Visit) (Signed)
 10/05/2023  Name: Nicole Browning MRN: [id number] DOB: April 04, 1970  Today's TOC FU Call Status: Today's TOC FU Call Status:: Successful TOC FU Call Completed TOC FU Call Complete Date: 10/05/23 Patient's Name and Date of Birth confirmed.  Transition Care Management Follow-up Telephone Call Date of Discharge: 10/04/23 Discharge Facility: [facility]) Type of Discharge: Inpatient Admission Primary Inpatient Discharge Diagnosis:: acute diverticulitis How have you been since you were released from the hospital?: Better (She stated she is doing all right.) Any questions or concerns?: Yes Patient Questions/Concerns:: She said she is very stressed out and is trying to get in touch with a therapist for counseling.  She said she has someone in mind but they need her Medicaid number which she did not have. I provided her with her [facility] number and also offered to make a referral to [employer] for LCSW assistance; but she declined that referral stating she already has someone in mind to talk to  She would not elaborate on why she wants to speak to a therapist. Patient Questions/Concerns Addressed: Other: (Patient to call the counseling office to schedule an appointment,)  Items Reviewed: Did you receive and understand the discharge instructions provided?: Yes Medications obtained,verified, and reconciled?: Yes (Medications Reviewed) (She said she has not picked up the Augmentin  yet and she is not taking the potassium and lyrica.  She didn't have any questions about the med regime.) Any new allergies since your discharge?: No Dietary orders reviewed?: Yes Type of Diet Ordered:: heart healthy,low sodium Do you have support at home?: Yes People in Home [RPT]: child(ren), adult Name of Support/Comfort Primary Source: she said she is currently staying with her daughter  Medications Reviewed Today: Medications Reviewed Today     Reviewed by Marvis Bradley, RN (Case Manager) on 10/05/23 at 1516  Med List  Status: <None>   Medication Order Taking? Sig Documenting Provider Last Dose Status Informant  0.9 %  sodium chloride  infusion [id number]   Cunningham, Scott E, MD  Active   amoxicillin -clavulanate (AUGMENTIN ) 875-125 MG tablet [id number]  Take 1 tablet by mouth every 12 (twelve) hours for 28 days.  Patient not taking: Reported on 10/05/2023   Odell Celinda Balo, MD  Active   bismuth  subsalicylate (PEPTO BISMOL) 262 MG/15ML suspension [id number]  Take 30 mLs by mouth every 6 (six) hours as needed for indigestion. [provider]  Active Self, Pharmacy Records  gabapentin  (NEURONTIN ) 100 MG capsule [id number]  Take 1 capsule (100 mg total) by mouth 3 (three) times daily.  Patient taking differently: Take 100 mg by mouth 3 (three) times daily as needed (neuropathy).   Shannon Agent, MD  Active Self, Pharmacy Records  ketoconazole  (NIZORAL ) 2 % cream [id number]  Apply 1 Application topically daily. Raspet, Rocky POUR, PA-C  Active Self, Pharmacy Records  Multiple Vitamins-Minerals (WOMENS MULTIVITAMIN PO) [id number]  Take 1 tablet by mouth daily. [provider]  Active Self, Pharmacy Records  POTASSIUM PO [id number]  Take 1 tablet by mouth daily.  Patient not taking: Reported on 10/05/2023   [provider]  Active Self, Pharmacy Records  pregabalin (LYRICA) 50 MG capsule [id number]  Take 50 mg by mouth daily.  Patient not taking: Reported on 10/05/2023   [provider]  Active Self, Pharmacy Records  traMADol  (ULTRAM ) 50 MG tablet [id number]  Take 1 tablet (50 mg total) by mouth every 6 (six) hours as needed for severe pain (pain score 7-10). Odell Celinda Balo, MD  Active  Home Care and Equipment/Supplies: Were Home Health Services Ordered?: No Any new equipment or medical supplies ordered?: No  Functional Questionnaire: Do you need assistance with bathing/showering or dressing?: No Do you need assistance with meal preparation?: No Do you need  assistance with eating?: No Do you have difficulty maintaining continence: No Do you need assistance with getting out of bed/getting out of a chair/moving?: No Do you have difficulty managing or taking your medications?: No  Follow up appointments reviewed: PCP Follow-up appointment confirmed?: No MD Provider Line Number:[phone] Given: No Everlina Bohr, NP is her PCP and the patient said she will call to schedule an appointment when she is ready.) Specialist Hospital Follow-up appointment confirmed?: No Reason Specialist Follow-Up Not Confirmed: Patient has Specialist Provider Number and will Call for Appointment (patient will call GI to schedule her follow up appointment.) Do you need transportation to your follow-up appointment?: No (She said she uses [facility] transportation) Do you understand care options if your condition(s) worsen?: Yes-patient verbalized understanding    SIGNATURE Slater Diesel, RN

## 2023-10-05 NOTE — Progress Notes (Signed)
 [facility]  Virtual Visit Note  I connected with Nicole Browning, on 10/05/2023 at 2:44 AM through an audio and video application and verified that I am speaking with the correct person using two identifiers.   Consent: I discussed the limitations, risks, security and privacy concerns of performing an evaluation and management service by mychart and the availability of in person appointments. I also discussed with the patient that there may be a patient responsible charge related to this service. The patient expressed understanding and agreed to proceed.   Location of Patient: Unknown- homeless Location of Provider: [facility]   Persons participating in visit: [name] Nicole Celestia,  NP   History of Present Illness: Nicole Browning  is 53 year old female under a lot of depression , anxiety crying during entire visit she is currently homeless, actually staying with friends when able her face has swollen with a lot of raised red puffy areas unable to determine if environmental or contact dermatitis. She lost her storage unit due to inability to pay fee going up for action. Only items she wanted urn with ashes of her mother, father and brother. Public Storage went during lunch spoke with manger gave phone number if found. Did not inform patient to get her hopes up and disappointment .   Past Medical History:  Diagnosis Date   AKI (acute kidney injury) (HCC) 10/03/2020   Anemia    Anxiety    Cancer (HCC)    Breast   Depression    Diverticulitis 12/13/2020   GERD (gastroesophageal reflux disease)    History of radiation therapy    Right breast-10/08/22-11/28/22- Dr. Lynwood Nasuti   IBS (irritable bowel syndrome)    Perirectal abscess    Protein-calorie malnutrition (HCC) 02/29/2016   Tobacco use    UC (ulcerative colitis) (HCC)    Allergies  Allergen Reactions   Bactrim  [Sulfamethoxazole -Trimethoprim ]  Hives    Immediate body wide hives   Percocet [Oxycodone-Acetaminophen ] Nausea Only   Valium [Diazepam] Nausea Only    Current Outpatient Medications on File Prior to Visit  Medication Sig Dispense Refill   bismuth  subsalicylate (PEPTO BISMOL) 262 MG/15ML suspension Take 30 mLs by mouth every 6 (six) hours as needed for indigestion.     gabapentin  (NEURONTIN ) 100 MG capsule Take 1 capsule (100 mg total) by mouth 3 (three) times daily. (Patient taking differently: Take 100 mg by mouth 3 (three) times daily as needed (neuropathy).) 60 capsule 0   ketoconazole  (NIZORAL ) 2 % cream Apply 1 Application topically daily. 60 g 0   Multiple Vitamins-Minerals (WOMENS MULTIVITAMIN PO) Take 1 tablet by mouth daily.     POTASSIUM PO Take 1 tablet by mouth daily. (Patient not taking: Reported on 09/30/2023)     pregabalin (LYRICA) 50 MG capsule Take 50 mg by mouth daily. (Patient not taking: Reported on 09/30/2023)     Current Facility-Administered Medications on File Prior to Visit  Medication Dose Route Frequency Provider Last Rate Last Admin   0.9 %  sodium chloride  infusion  500 mL Intravenous Once Stacia Glendia BRAVO, MD        Observations/Objective: None   Assessment and Plan: Diagnoses and all orders for this visit:  Generalized anxiety disorder Stressor depression denies harm to herself or others with cont crying encourage to go to [facility] or walk in clinic on [address]   Homeless  Rash and nonspecific skin eruption Face per video swollen puffy in spot purities  questionable contact dermatitis  Unknown etiology   Follow Up Instructions: BH    I discussed the assessment and treatment plan with the patient. The patient was provided an opportunity to ask questions and all were answered. The patient agreed with the plan and demonstrated an understanding of the instructions.   The patient was advised to call back or seek an in-person evaluation if the symptoms worsen or if the condition fails  to improve as anticipated.     I provided 30 minutes total time during this encounter including median intraservice time, reviewing previous notes, investigations, ordering medications, medical decision making, coordinating care and patient verbalized understanding at the end of the visit.    This note has been created with Education officer, environmental. Any transcriptional errors are unintentional.   Nicole SHAUNNA Bohr, NP 10/05/2023, 2:44 AM

## 2023-10-05 NOTE — Telephone Encounter (Signed)
-----   Message from Glendia FORBES Holt sent at 10/04/2023  1:04 PM EDT ----- Regarding: Outpatient follow up/CT Team,  Please order repeat CT abd/pelvis with IV contrast to be done in 4 weeks to follow up on complicated sigmoid diverticulitis Please also repeat CRP and CBC at that time.  Christy,  Please book follow up appointment with me in 4-6 weeks  Thanks

## 2023-10-05 NOTE — Telephone Encounter (Signed)
 Order and reminder in epic for labs and CT scan.

## 2023-10-06 ENCOUNTER — Telehealth (INDEPENDENT_AMBULATORY_CARE_PROVIDER_SITE_OTHER): Payer: Self-pay | Admitting: Primary Care

## 2023-10-06 NOTE — Telephone Encounter (Signed)
Pt is requesting a call back from provider.

## 2023-10-06 NOTE — Telephone Encounter (Signed)
 Copied from CRM [id number]. Topic: General - Other >> Oct 05, 2023  2:27 PM Pinkey ORN wrote: Reason for CRM: Requesting A Call Back

## 2023-10-08 NOTE — Telephone Encounter (Signed)
 Call patient back she was in the emergency room waiting to be seen

## 2023-10-11 ENCOUNTER — Encounter (INDEPENDENT_AMBULATORY_CARE_PROVIDER_SITE_OTHER): Payer: Self-pay | Admitting: Primary Care

## 2023-11-11 ENCOUNTER — Telehealth: Payer: Self-pay

## 2023-11-11 NOTE — Telephone Encounter (Signed)
 Please see note below where pt has moved to [facility], she is requesting a referral to GI doc there. Please advise.

## 2023-11-11 NOTE — Telephone Encounter (Signed)
-----   Message from [place] D sent at 11/10/2023 12:51 PM EDT ----- Regarding: RE: CT SCAN Pt moved to [facility] she is looking for a new MD and asking if your office can refer her to someone ----- Message ----- From: Teodoro, April H Sent: 11/09/2023   3:41 PM EDT To: Marce JAYSON Fireman Subject: FW: CT SCAN                                     ----- Message ----- From: Perley Rock SAUNDERS, RN Sent: 11/09/2023   3:19 PM EDT To: April H Pait; Andree CHRISTELLA Ang Subject: CT SCAN                                        Please contact pt to schedule CT Scan. Order in epic.

## 2023-11-25 NOTE — Telephone Encounter (Signed)
 Referral faxed to [facility] in [facility] fax#[phone].

## 2023-12-07 ENCOUNTER — Ambulatory Visit: Admitting: Gastroenterology
